# Patient Record
Sex: Female | Born: 1935 | Race: Black or African American | Hispanic: No | Smoking: Never smoker
Health system: Southern US, Community
[De-identification: ages and names within clinical notes are randomized; demographics above are authoritative.]

## PROBLEM LIST (undated history)

## (undated) DIAGNOSIS — G20A1 Parkinson's disease without dyskinesia, without mention of fluctuations: Secondary | ICD-10-CM

## (undated) DIAGNOSIS — G20 Parkinson's disease: Secondary | ICD-10-CM

## (undated) DIAGNOSIS — I509 Heart failure, unspecified: Secondary | ICD-10-CM

## (undated) DIAGNOSIS — I10 Essential (primary) hypertension: Secondary | ICD-10-CM

## (undated) DIAGNOSIS — C801 Malignant (primary) neoplasm, unspecified: Secondary | ICD-10-CM

## (undated) DIAGNOSIS — I219 Acute myocardial infarction, unspecified: Secondary | ICD-10-CM

## (undated) DIAGNOSIS — G309 Alzheimer's disease, unspecified: Secondary | ICD-10-CM

## (undated) DIAGNOSIS — F0280 Dementia in other diseases classified elsewhere without behavioral disturbance: Secondary | ICD-10-CM

## (undated) HISTORY — PX: COLON SURGERY: SHX602

## (undated) HISTORY — PX: TONSILLECTOMY: SUR1361

## (undated) HISTORY — PX: ABDOMINAL HYSTERECTOMY: SHX81

## (undated) HISTORY — PX: CHOLECYSTECTOMY: SHX55

## (undated) HISTORY — PX: MASTECTOMY: SHX3

## (undated) HISTORY — PX: BREAST SURGERY: SHX581

## (undated) HISTORY — PX: LEG SURGERY: SHX1003

## (undated) HISTORY — PX: PELVIC FRACTURE SURGERY: SHX119

## (undated) HISTORY — PX: OTHER SURGICAL HISTORY: SHX169

---

## 2001-09-16 ENCOUNTER — Emergency Department (HOSPITAL_COMMUNITY): Admission: EM | Admit: 2001-09-16 | Discharge: 2001-09-16 | Payer: Self-pay | Admitting: Emergency Medicine

## 2001-09-16 ENCOUNTER — Encounter: Payer: Self-pay | Admitting: Emergency Medicine

## 2001-09-23 ENCOUNTER — Ambulatory Visit (HOSPITAL_COMMUNITY): Admission: RE | Admit: 2001-09-23 | Discharge: 2001-09-23 | Payer: Self-pay | Admitting: Family Medicine

## 2001-09-23 ENCOUNTER — Encounter: Payer: Self-pay | Admitting: Family Medicine

## 2002-06-13 ENCOUNTER — Emergency Department (HOSPITAL_COMMUNITY): Admission: EM | Admit: 2002-06-13 | Discharge: 2002-06-13 | Payer: Self-pay | Admitting: Emergency Medicine

## 2002-06-13 ENCOUNTER — Encounter: Payer: Self-pay | Admitting: Emergency Medicine

## 2002-06-16 ENCOUNTER — Encounter: Payer: Self-pay | Admitting: Emergency Medicine

## 2002-06-16 ENCOUNTER — Inpatient Hospital Stay (HOSPITAL_COMMUNITY): Admission: EM | Admit: 2002-06-16 | Discharge: 2002-06-19 | Payer: Self-pay | Admitting: Emergency Medicine

## 2002-06-17 ENCOUNTER — Encounter: Payer: Self-pay | Admitting: Family Medicine

## 2002-06-18 ENCOUNTER — Encounter: Payer: Self-pay | Admitting: General Surgery

## 2002-11-21 ENCOUNTER — Encounter: Payer: Self-pay | Admitting: *Deleted

## 2002-11-21 ENCOUNTER — Emergency Department (HOSPITAL_COMMUNITY): Admission: EM | Admit: 2002-11-21 | Discharge: 2002-11-21 | Payer: Self-pay | Admitting: *Deleted

## 2003-06-02 ENCOUNTER — Emergency Department (HOSPITAL_COMMUNITY): Admission: EM | Admit: 2003-06-02 | Discharge: 2003-06-02 | Payer: Self-pay | Admitting: Emergency Medicine

## 2004-05-12 ENCOUNTER — Emergency Department (HOSPITAL_COMMUNITY): Admission: EM | Admit: 2004-05-12 | Discharge: 2004-05-12 | Payer: Self-pay | Admitting: Emergency Medicine

## 2004-06-08 ENCOUNTER — Ambulatory Visit (HOSPITAL_COMMUNITY): Admission: RE | Admit: 2004-06-08 | Discharge: 2004-06-08 | Payer: Self-pay | Admitting: General Surgery

## 2004-12-17 ENCOUNTER — Emergency Department (HOSPITAL_COMMUNITY): Admission: EM | Admit: 2004-12-17 | Discharge: 2004-12-17 | Payer: Self-pay | Admitting: Emergency Medicine

## 2005-04-11 ENCOUNTER — Emergency Department (HOSPITAL_COMMUNITY): Admission: EM | Admit: 2005-04-11 | Discharge: 2005-04-11 | Payer: Self-pay | Admitting: Emergency Medicine

## 2005-07-19 ENCOUNTER — Emergency Department (HOSPITAL_COMMUNITY): Admission: EM | Admit: 2005-07-19 | Discharge: 2005-07-19 | Payer: Self-pay | Admitting: Emergency Medicine

## 2005-12-17 ENCOUNTER — Emergency Department (HOSPITAL_COMMUNITY): Admission: EM | Admit: 2005-12-17 | Discharge: 2005-12-17 | Payer: Self-pay | Admitting: Emergency Medicine

## 2005-12-22 ENCOUNTER — Emergency Department (HOSPITAL_COMMUNITY): Admission: EM | Admit: 2005-12-22 | Discharge: 2005-12-22 | Payer: Self-pay | Admitting: Emergency Medicine

## 2006-02-05 ENCOUNTER — Emergency Department (HOSPITAL_COMMUNITY): Admission: EM | Admit: 2006-02-05 | Discharge: 2006-02-05 | Payer: Self-pay | Admitting: Emergency Medicine

## 2006-03-24 ENCOUNTER — Emergency Department (HOSPITAL_COMMUNITY): Admission: RE | Admit: 2006-03-24 | Discharge: 2006-03-24 | Payer: Self-pay | Admitting: Emergency Medicine

## 2006-05-04 ENCOUNTER — Ambulatory Visit: Payer: Self-pay | Admitting: Family Medicine

## 2007-05-07 ENCOUNTER — Emergency Department (HOSPITAL_COMMUNITY): Admission: EM | Admit: 2007-05-07 | Discharge: 2007-05-07 | Payer: Self-pay | Admitting: Emergency Medicine

## 2007-07-08 ENCOUNTER — Ambulatory Visit (HOSPITAL_COMMUNITY): Admission: RE | Admit: 2007-07-08 | Discharge: 2007-07-08 | Payer: Self-pay | Admitting: Family Medicine

## 2008-04-05 ENCOUNTER — Emergency Department (HOSPITAL_COMMUNITY): Admission: EM | Admit: 2008-04-05 | Discharge: 2008-04-05 | Payer: Self-pay | Admitting: Emergency Medicine

## 2009-06-20 ENCOUNTER — Emergency Department (HOSPITAL_COMMUNITY): Admission: EM | Admit: 2009-06-20 | Discharge: 2009-06-20 | Payer: Self-pay | Admitting: Emergency Medicine

## 2009-11-23 ENCOUNTER — Emergency Department (HOSPITAL_COMMUNITY): Admission: EM | Admit: 2009-11-23 | Discharge: 2009-11-23 | Payer: Self-pay | Admitting: Emergency Medicine

## 2010-01-27 ENCOUNTER — Ambulatory Visit (HOSPITAL_COMMUNITY): Admission: RE | Admit: 2010-01-27 | Discharge: 2010-01-27 | Payer: Self-pay | Admitting: Neurology

## 2010-08-09 ENCOUNTER — Emergency Department (HOSPITAL_COMMUNITY)
Admission: EM | Admit: 2010-08-09 | Discharge: 2010-08-09 | Payer: Self-pay | Source: Home / Self Care | Admitting: Emergency Medicine

## 2010-10-08 ENCOUNTER — Encounter: Payer: Self-pay | Admitting: General Surgery

## 2010-12-11 LAB — POCT I-STAT, CHEM 8
BUN: 17 mg/dL (ref 6–23)
Calcium, Ion: 1.11 mmol/L — ABNORMAL LOW (ref 1.12–1.32)
Chloride: 105 mEq/L (ref 96–112)
Creatinine, Ser: 0.8 mg/dL (ref 0.4–1.2)
Glucose, Bld: 108 mg/dL — ABNORMAL HIGH (ref 70–99)
HCT: 43 % (ref 36.0–46.0)
Hemoglobin: 14.6 g/dL (ref 12.0–15.0)
Potassium: 4.1 mEq/L (ref 3.5–5.1)
Sodium: 138 mEq/L (ref 135–145)
TCO2: 29 mmol/L (ref 0–100)

## 2011-01-16 ENCOUNTER — Emergency Department (HOSPITAL_COMMUNITY)
Admission: EM | Admit: 2011-01-16 | Discharge: 2011-01-17 | Disposition: A | Discharge status: Home / Self Care | Payer: PRIVATE HEALTH INSURANCE | Attending: Emergency Medicine | Admitting: Emergency Medicine

## 2011-01-16 ENCOUNTER — Emergency Department (HOSPITAL_COMMUNITY): Payer: PRIVATE HEALTH INSURANCE

## 2011-01-16 DIAGNOSIS — G309 Alzheimer's disease, unspecified: Secondary | ICD-10-CM | POA: Insufficient documentation

## 2011-01-16 DIAGNOSIS — Z853 Personal history of malignant neoplasm of breast: Secondary | ICD-10-CM | POA: Insufficient documentation

## 2011-01-16 DIAGNOSIS — M25569 Pain in unspecified knee: Secondary | ICD-10-CM | POA: Insufficient documentation

## 2011-01-16 DIAGNOSIS — I10 Essential (primary) hypertension: Secondary | ICD-10-CM | POA: Insufficient documentation

## 2011-01-16 DIAGNOSIS — Z9889 Other specified postprocedural states: Secondary | ICD-10-CM | POA: Insufficient documentation

## 2011-01-16 DIAGNOSIS — E785 Hyperlipidemia, unspecified: Secondary | ICD-10-CM | POA: Insufficient documentation

## 2011-01-16 DIAGNOSIS — Z79899 Other long term (current) drug therapy: Secondary | ICD-10-CM | POA: Insufficient documentation

## 2011-01-16 DIAGNOSIS — IMO0002 Reserved for concepts with insufficient information to code with codable children: Secondary | ICD-10-CM | POA: Insufficient documentation

## 2011-01-16 DIAGNOSIS — M1710 Unilateral primary osteoarthritis, unspecified knee: Secondary | ICD-10-CM | POA: Insufficient documentation

## 2011-01-16 DIAGNOSIS — F0280 Dementia in other diseases classified elsewhere without behavioral disturbance: Secondary | ICD-10-CM | POA: Insufficient documentation

## 2011-02-03 NOTE — Discharge Summary (Signed)
[name]                            ACCOUNT NO.:  [id number]   MEDICAL RECORD NO.:  [id number]                   PATIENT TYPE:  INP   LOCATION:  A330                                 FACILITY:  [facility]   PHYSICIAN:  [name], M.D.                DATE OF BIRTH:  [birth year]   DATE OF ADMISSION:  06/16/2002  DATE OF DISCHARGE:  06/19/2002                                 DISCHARGE SUMMARY   PATIENT IDENTIFICATION:  The patient was an 76 year old separated  multiparous black female from [place], [place].   CHIEF COMPLAINT:  Progressive abdominal pain.  The patient was seen in the  emergency room on June 13, 2002 complaining of abdominal pain.  X-ray  study and blood works were within normal limits.  She returned on the day of  admission with complaint of increased abdominal pain.  She described the  pain as sharp and piercing.  She also complained of nausea and vomiting.  She described her vomiting as food stuff.  The patient denied hematemesis,  diarrhea, abdominal distention, fever and chills.   PAST MEDICAL HISTORY:  Significant for exploratory laparotomy with primary  closure of sigmoid colon perforation on September 07, 2000.  Medical history  was also positive for diverticulosis, chronic mild dementia, hypertension,  chronic anxiety, status post left mastectomy, chronic lymphedema of left  arm.   ALLERGIES:  PENICILLIN.   HOSPITALIZATIONS:  Positive for hospitalizations for gastritis and  esophageal varix in May 1995 via EGD; right colectomy in May 1995;  supraventricular tachy in April 2000; modified radical mastectomy secondary  to breast cancer; hysterectomy; appendectomy; tonsillectomy; and right  corneal implant.   FAMILY HISTORY:  Revealed mother and father deceased, cause unknown; several  adult brothers and sisters with health unknown; adult children living,  health unknown.   HOSPITAL COURSE:  1. Abdominal pain probably secondary to  ileus.  General appearance revealed     an elderly, medium-framed, medium-height, alert black female in no     apparent respiratory distress.  Vital signs on admission were as follows:     Temperature 97.5, pulse 59, respirations 20, blood pressure 183/72.  Skin     was warm and dry.  Sclerae were white.  Lungs were clear.  Heart revealed     audible S1 and S2 without murmur.  Rate was 68 and rhythm was regular.     Abdomen demonstrated no distention and healed, multiple old surgical     scars.  Abdominal pain exam demonstrated no palpable masses or     organomegaly.  Rectal examination demonstrated no external lesion.     Visual exam demonstrated pink tinged and guaiac positive.  Rectal exam     demonstrated no palpable rectal vault masses.  The patient was     neurologically alert and oriented to person and place at time of     admission.  Cranial nerves II-XII appeared intact.  Significant labs on     admission:  White count 9.0; hemoglobin 13.2; hematocrit 40.4; platelets     300,000.  Sodium 136, potassium 4.1, chloride 104, CO2  26, glucose 133,     BUN 9, creatinine 0.7, calcium 9.0, total protein 7.2, albumin 3.5, AST     17, ALT 12, ALP 61, total bilirubin 0.6, serum amylase 66, serum lipase     17.  Urinalysis:  Specific gravity greater than 1.030, ph 6.0, no     glucose, moderate amount of hemoglobin, no bilirubin, no ketones, no     protein, nitrite negative, no leukocyte esterase, 3-6 rbc's.  CT of the     abdomen on June 16, 2002 was read as borderline small bowel     dilatation suggesting the possibility of small-grade partial small bowel     obstruction versus regional ileus; polycystic liver disease and several     small renal lesions likely representing cysts.  CT of the pelvis with     contrast read as apparent transition of small bowel caliber in the lower     pelvis suggesting possible small bowel obstruction, potentially related     to adhesions.  However, the  more dilated proximal small bowel is only     borderline dilated in size and thus most likely low-grade partial     obstruction.  There was minimal bowel wall thickening in the region of     the transition which was not focal and thus was not felt to represent any     mass - read by Dr. Soyla Murphy. Liebkemann.  The patient was admitted to the     medical floor.  She was made n.p.o.  A surgical consult was ordered.  NG     tube was started at moderate intermittent suction with IV fluids, Pepcid     20 mg IV, analgesic for pain, and other supportive measures.  A surgical     consult was done by Dr. Maggie Schwalbe during this hospitalization on June 17, 2002.  Dr. Katrinka Blazing ordered a small bowel follow through which was     completed on June 18, 2002.  Small bowel follow through was read as no     obstruction and status post right and proximal transverse colectomy by     Dr. Zella Ball.  The patient's hospital course was benign.  She     experienced some nausea on admission.  At time of discharge she had no     complaint of abdominal pain, nausea, vomiting, or distention.  She was     tolerating a regular diet without problem.  She was discharged to her     home where her family assists in her care.  Last white count was 7.4 on     June 18, 2002 with a hemoglobin of 12.6 and hematocrit 38.9.  Last     electrolytes was on June 18, 2002 and demonstrated the following:     Sodium 142, potassium 4.1, chloride 106, CO2 28, BUN 8, creatinine 0.9.     Abdominal exam on the morning of discharge demonstrated no tenderness in     all four quadrants, no palpable masses or hepatomegaly. The patient was     alert and oriented to person and place at the time of discharge.  2. Hypertension.  Blood pressure on admission was 183/72.  Lungs were clear.     Heart exam  revealed audible S1, S2 without a murmur.  Rate was 68 and     regular.  The patient was treated with a Catapres-TTS-1 patch for blood     pressure.  Examination of extremities demonstrated no edema.  Blood     pressure reading on the morning of discharge was 149/71.  She had no     complaint of chest pain, palpitations, or shortness of breath.  3. Chronic mild senile dementia. The patient was oriented to person and     place at time of admission.  She experienced confusion to time and place     throughout this hospitalization.  Confusion appeared to be worse at     night.  She did not use any verbally abusive language.  Moreover, there     was no sign of combative behaviour.  The patient's confusion is not new     to her physician or family members.  4. Status post left mastectomy secondary to cancer.  Examination of her left     chest wall demonstrated old healed surgical scar.  The patient had no     nodule of this area or complaint of chest wall pain.  Examination of the     right breast demonstrated no skin changes or nodule on palpation.  Nipple     was erect without discharge.  5. Chronic left upper extremity lymphedema.  Examination of left arm     demonstrated nonpitting swelling.  The patient was advised to keep left     arm elevated at rest on pillow.  The patient had no complaint of left arm     pain or left axillary pain.  6. Diverticulosis.  The patient had a history of diverticulosis.  She had     undergone a colonoscopy in the past and had experienced perforation.     Therefore, no attempt was made to repeat this procedure.  Hemoglobin     remained stable throughout this hospitalization despite slight pink-     tinged residue pertaining to rectal area on admission.  Hemoglobin on     admission was 13.2 and hemoglobin was 12.6 on June 18, 2002.  The     patient's CBC will be repeated periodically as an outpatient.  Family     members were advised to monitor the stools for gross rectal bleeding or     complaint of recurrent abdominal pain.  She has remained afebrile     throughout this hospitalization.   Moreover, there is no history of melena     or black stools.   INSTRUCTIONS AT THE TIME OF DISCHARGE:  1. Diet:  Low salt.  2. Activity:  As tolerated.  3. Follow-up:  At the office on June 24, 2002 at 2:15 p.m.   MEDICATIONS:  1. Catapres-TTS-1 every seven days.  2. Pepcid 20 mg one tablet p.o. q.h.s.  3. Xanax 0.5 mg p.o. q.12h.  4. Baby aspirin one p.o. q.d.  5. Do not take Cardizem or metoprolol.   PRIMARY DIAGNOSIS:  Abdominal pain secondary to regional ileus.   SECONDARY DIAGNOSES:  1. Chronic senile dementia.  2. Status post left mastectomy.  3. Hypertension.  4. Diverticulosis.  5. Chronic left upper extremity lymphedema.  [name], M.D.    [name]  D:  06/19/2002  T:  06/23/2002  Job:  [id number]

## 2011-02-03 NOTE — H&P (Signed)
NAMEBARBIE, [name]                            ACCOUNT NO.:  [id number]   MEDICAL RECORD NO.:  [id number]                   PATIENT TYPE:  INP   LOCATION:  A330                                 FACILITY:  [facility]   PHYSICIAN:  Annia Friendly. Loleta Chance, M.D.                DATE OF BIRTH:  07/29/1920   DATE OF ADMISSION:  06/16/2002  DATE OF DISCHARGE:                                HISTORY & PHYSICAL   HISTORY OF PRESENT ILLNESS:  The patient is an 77 year old, separated,  multiparous black female from [place], [place].  The patient  returned to the emergency room, complaining of progressive sella pain.  The  patient was seen in the emergency room on June 13, 2002, complaining of  abdominal pain.  X-ray study and blood work were within normal limits.  She  returned today with complaint of increased abdominal pain.  She described  the pain as sharp pierce.  She also complained of nausea and vomiting on the  morning of admission.  She denies hematemesis, diarrhea, abdominal  distention, fever, and chills.  X-ray of abdomen was read as borderline  dilated jejunum, probable low grade positive small bowel obstruction with  transition from borderline dilated bowel to small caliber bowel seen in mid  pelvis.  (possibly due to adhesions).  Also seen was polycystic liver, again  noted.   PAST SURGICAL HISTORY:  Significant for exploratory laparotomy with primary  closure of sigmoid colon perforation on September 07, 2000, by Dr. Einar Crow.  Smith.   PAST MEDICAL HISTORY:  1. Diverticulosis.  2. Chronic mild dementia.  3. Hypertension.  4. Chronic anxiety.  5. Status post left mastectomy.  6. Chronic lymphedema of her left arm.  7. Negative for tuberculosis, sickle cell, asthma, and seizure disorder.   MEDICATIONS:  1. Cardizem CD 240 mg p.o. q.d.  2. Metoprolol 25 mg p.o. q.d.  3. Xanax 0.5 mg p.o. b.i.d.   ALLERGIES:  The patient is allergic to PENICILLIN.   PAST MEDICAL HISTORY:  1.  Positive for hospitalization for gastritis and esophageal varix, did EGD     in May 1995.  2. Status post right colectomy in May of 1995.  3. Supraventricular tachycardia in April 2000.  4. The patient also has undergone left modified radical mastectomy secondary     to breast cancer.  5. Hysterectomy.  6. Appendectomy.  7. Tonsillectomy.  8. Right corneal lens implant.   FAMILY HISTORY:  Mother and father deceased; several adult brothers and  sisters living, health unknown.  Adult children living, health unknown.   REVIEW OF SYSTEMS:  Positive for forgetfulness, chronic swelling of left  upper arm, and episode of right leg stiffness and pain.   PHYSICAL EXAMINATION:  GENERAL:  Lies on the floor.  Revealed an elderly,  medium-framed, medium height, alert, black female in no apparent respiratory  distress.  VITAL SIGNS:  Temperature  97.5, pulse 59, respirations 20, blood pressure  183/72.  SKIN:  Warm and dry.  HEENT:  Head normocephalic.  Ears:  Normal auricle.  External canals  positive for some cerumen.  Eyes:  Lids negative for ptosis.  Positive right  coronary implant.  Sclerae white.  Extraocular movements intact.  Nose:  Negative for discharge.  Mouth:  Dentition fair, positive missing teeth, no  bleeding gums.  Posterior pharynx benign.  NECK:  Negative for lymphadenopathy or thyromegaly.  LUNGS:  Clear.  HEART:  Audible S1, S2, without murmur.  Rate 68 and rhythm regular.  BREASTS:  Right breast, no skin changes, no nodular palpation, nipples erect  without discharge.  Left breast absent, positive healed surgical scar.  ABDOMEN:  No distention.  Positive multiple healed hyper gastric surgical  scars.  Soft.  Positive for mid hypogastric tenderness.  No palpable mass,  no organomegaly.  EXTERNAL GENITALIA:  Normal female.  RECTAL:  No external bleeding.  Digital exam:  Pink-tinged and guaiac  positive.  No palpable rectal vault masses.  EXTREMITIES:  Left upper positive  nontender edema.  Right knee positive for  crepitus and ___________ heel lateral elliptical scar, no edema.  Palpable  dorsalis pedis bilaterally.  NEUROLOGIC:  Alert and oriented to person and place.  Cranial nerves II-XII  appeared intact.   LABORATORY DATA:  White count 9.0, hemoglobin 13.2, hematocrit 40.4,  platelets 300,000.  Sodium 136, potassium 4.6, chloride 104, CO2 26, glucose  133, BUN 9, creatinine 0.7, calcium 9.0, total protein 7.2.  Albumin 3.5,  AST 17, ALT 12, ALP 61, total bilirubin 0.6.  Serum amylase  66, serum  lipase 17.  Urinalysis:  pH 6.0, specific gravity greater than 1.030.  Moderate mild hemoglobin, no ketones, no protein, nitrate negative,  leukocyte esterase negative, few epithelial cells, 3-6 rbc's.   IMPRESSION:  Abdominal pain, probably secondary a low grade, possibly small  bowel obstruction.   SECONDARY DIAGNOSES:  1. Hypertension.  2. Status post left mastectomy.  3. Chronic left upper extremity lymphedema.  4. Diverticulosis.   PLAN:  1. NG tube at the moderate intermittent suction.  2. N.p.o.  3. IV fluid D5 half normal saline with 10 mEq potassium chloride at 125 cc     per hour.  4. Surgical consult.  5. Pepcid 20 mg IV q.d.  6. Levaquin 500 mg IV q.d.  7. Repeat CBC and met 7 every morning x3.  8. Analgesia for pain.  9.     Record daily I's and O's.  10.      Catapres TTS patch 0.2 every seven days.  11.      Monitor blood pressure.                                                   Annia Friendly. Loleta Chance, M.D.    Levonne Hubert  D:  06/16/2002  T:  06/16/2002  Job:  [id number]

## 2011-02-16 ENCOUNTER — Emergency Department (HOSPITAL_COMMUNITY): Payer: PRIVATE HEALTH INSURANCE

## 2011-02-16 ENCOUNTER — Emergency Department (HOSPITAL_COMMUNITY)
Admission: EM | Admit: 2011-02-16 | Discharge: 2011-02-17 | Disposition: A | Discharge status: Home / Self Care | Payer: PRIVATE HEALTH INSURANCE | Attending: Emergency Medicine | Admitting: Emergency Medicine

## 2011-02-16 ENCOUNTER — Encounter (HOSPITAL_COMMUNITY): Payer: Self-pay | Admitting: Radiology

## 2011-02-16 DIAGNOSIS — Y92009 Unspecified place in unspecified non-institutional (private) residence as the place of occurrence of the external cause: Secondary | ICD-10-CM | POA: Insufficient documentation

## 2011-02-16 DIAGNOSIS — S7000XA Contusion of unspecified hip, initial encounter: Secondary | ICD-10-CM | POA: Insufficient documentation

## 2011-02-16 DIAGNOSIS — F0280 Dementia in other diseases classified elsewhere without behavioral disturbance: Secondary | ICD-10-CM | POA: Insufficient documentation

## 2011-02-16 DIAGNOSIS — I10 Essential (primary) hypertension: Secondary | ICD-10-CM | POA: Insufficient documentation

## 2011-02-16 DIAGNOSIS — Z79899 Other long term (current) drug therapy: Secondary | ICD-10-CM | POA: Insufficient documentation

## 2011-02-16 DIAGNOSIS — E785 Hyperlipidemia, unspecified: Secondary | ICD-10-CM | POA: Insufficient documentation

## 2011-02-16 DIAGNOSIS — Z853 Personal history of malignant neoplasm of breast: Secondary | ICD-10-CM | POA: Insufficient documentation

## 2011-02-16 DIAGNOSIS — G309 Alzheimer's disease, unspecified: Secondary | ICD-10-CM | POA: Insufficient documentation

## 2011-02-16 DIAGNOSIS — W19XXXA Unspecified fall, initial encounter: Secondary | ICD-10-CM | POA: Insufficient documentation

## 2011-02-16 DIAGNOSIS — Z7982 Long term (current) use of aspirin: Secondary | ICD-10-CM | POA: Insufficient documentation

## 2011-02-16 DIAGNOSIS — Y998 Other external cause status: Secondary | ICD-10-CM | POA: Insufficient documentation

## 2011-02-16 DIAGNOSIS — R112 Nausea with vomiting, unspecified: Secondary | ICD-10-CM | POA: Insufficient documentation

## 2011-02-16 HISTORY — DX: Essential (primary) hypertension: I10

## 2011-02-16 HISTORY — DX: Malignant (primary) neoplasm, unspecified: C80.1

## 2011-02-16 LAB — COMPREHENSIVE METABOLIC PANEL
ALT: 9 U/L (ref 0–35)
AST: 17 U/L (ref 0–37)
Albumin: 3.5 g/dL (ref 3.5–5.2)
Alkaline Phosphatase: 59 U/L (ref 39–117)
BUN: 19 mg/dL (ref 6–23)
CO2: 29 mEq/L (ref 19–32)
Calcium: 9.7 mg/dL (ref 8.4–10.5)
Chloride: 101 mEq/L (ref 96–112)
Creatinine, Ser: 1.08 mg/dL (ref 0.4–1.2)
GFR calc Af Amer: 58 mL/min — ABNORMAL LOW (ref >60)
GFR calc non Af Amer: 48 mL/min — ABNORMAL LOW (ref >60)
Glucose, Bld: 138 mg/dL — ABNORMAL HIGH (ref 70–99)
Potassium: 3.7 mEq/L (ref 3.5–5.1)
Sodium: 139 mEq/L (ref 135–145)
Total Bilirubin: 0.2 mg/dL — ABNORMAL LOW (ref 0.3–1.2)
Total Protein: 7.8 g/dL (ref 6.0–8.3)

## 2011-02-16 LAB — DIFFERENTIAL
Basophils Absolute: 0 10*3/uL (ref 0.0–0.1)
Basophils Relative: 0 % (ref 0–1)
Eosinophils Absolute: 0 10*3/uL (ref 0.0–0.7)
Eosinophils Relative: 0 % (ref 0–5)
Lymphocytes Relative: 13 % (ref 12–46)
Lymphs Abs: 1.7 10*3/uL (ref 0.7–4.0)
Monocytes Absolute: 0.8 10*3/uL (ref 0.1–1.0)
Monocytes Relative: 6 % (ref 3–12)
Neutro Abs: 10.4 10*3/uL — ABNORMAL HIGH (ref 1.7–7.7)
Neutrophils Relative %: 81 % — ABNORMAL HIGH (ref 43–77)

## 2011-02-16 LAB — PROTIME-INR
INR: 1.01 (ref 0.00–1.49)
Prothrombin Time: 13.5 seconds (ref 11.6–15.2)

## 2011-02-16 LAB — CBC
HCT: 37.3 % (ref 36.0–46.0)
Hemoglobin: 11.7 g/dL — ABNORMAL LOW (ref 12.0–15.0)
MCH: 22.8 pg — ABNORMAL LOW (ref 26.0–34.0)
MCHC: 31.4 g/dL (ref 30.0–36.0)
MCV: 72.7 fL — ABNORMAL LOW (ref 78.0–100.0)
Platelets: 302 10*3/uL (ref 150–400)
RBC: 5.13 MIL/uL — ABNORMAL HIGH (ref 3.87–5.11)
RDW: 14.8 % (ref 11.5–15.5)
WBC: 12.9 10*3/uL — ABNORMAL HIGH (ref 4.0–10.5)

## 2011-02-16 LAB — APTT: aPTT: 25 seconds (ref 24–37)

## 2011-02-17 LAB — URINALYSIS, ROUTINE W REFLEX MICROSCOPIC
Bilirubin Urine: NEGATIVE
Glucose, UA: NEGATIVE mg/dL
Leukocytes, UA: NEGATIVE
Nitrite: NEGATIVE
Protein, ur: NEGATIVE mg/dL
Specific Gravity, Urine: >1.03 — ABNORMAL HIGH (ref 1.005–1.030)
Urobilinogen, UA: 0.2 mg/dL (ref 0.0–1.0)
pH: 5.5 (ref 5.0–8.0)

## 2011-02-17 LAB — URINE MICROSCOPIC-ADD ON

## 2011-02-19 LAB — URINE CULTURE
Colony Count: NO GROWTH
Culture  Setup Time: 201206020047
Culture: NO GROWTH

## 2011-06-16 LAB — CULTURE, ROUTINE-ABSCESS: Gram Stain: NONE SEEN

## 2011-06-30 LAB — POCT CARDIAC MARKERS
CKMB, poc: <1 — ABNORMAL LOW
Myoglobin, poc: 35.9
Operator id: 208401
Troponin i, poc: <0.05

## 2011-07-27 ENCOUNTER — Other Ambulatory Visit (HOSPITAL_COMMUNITY): Payer: Self-pay | Admitting: Family Medicine

## 2011-07-27 ENCOUNTER — Ambulatory Visit (HOSPITAL_COMMUNITY)
Admission: RE | Admit: 2011-07-27 | Discharge: 2011-07-27 | Disposition: A | Discharge status: Home / Self Care | Payer: PRIVATE HEALTH INSURANCE | Source: Ambulatory Visit | Attending: Family Medicine | Admitting: Family Medicine

## 2011-07-27 DIAGNOSIS — R52 Pain, unspecified: Secondary | ICD-10-CM

## 2011-07-27 DIAGNOSIS — M25529 Pain in unspecified elbow: Secondary | ICD-10-CM | POA: Insufficient documentation

## 2011-08-13 ENCOUNTER — Emergency Department (HOSPITAL_COMMUNITY): Payer: PRIVATE HEALTH INSURANCE

## 2011-08-13 ENCOUNTER — Emergency Department (HOSPITAL_COMMUNITY)
Admission: EM | Admit: 2011-08-13 | Discharge: 2011-08-13 | Disposition: A | Discharge status: Home / Self Care | Payer: PRIVATE HEALTH INSURANCE | Attending: Emergency Medicine | Admitting: Emergency Medicine

## 2011-08-13 ENCOUNTER — Encounter (HOSPITAL_COMMUNITY): Payer: Self-pay

## 2011-08-13 DIAGNOSIS — Z859 Personal history of malignant neoplasm, unspecified: Secondary | ICD-10-CM | POA: Insufficient documentation

## 2011-08-13 DIAGNOSIS — IMO0002 Reserved for concepts with insufficient information to code with codable children: Secondary | ICD-10-CM | POA: Insufficient documentation

## 2011-08-13 DIAGNOSIS — Z9079 Acquired absence of other genital organ(s): Secondary | ICD-10-CM | POA: Insufficient documentation

## 2011-08-13 DIAGNOSIS — Y92009 Unspecified place in unspecified non-institutional (private) residence as the place of occurrence of the external cause: Secondary | ICD-10-CM | POA: Insufficient documentation

## 2011-08-13 DIAGNOSIS — F0390 Unspecified dementia without behavioral disturbance: Secondary | ICD-10-CM | POA: Insufficient documentation

## 2011-08-13 DIAGNOSIS — X58XXXA Exposure to other specified factors, initial encounter: Secondary | ICD-10-CM | POA: Insufficient documentation

## 2011-08-13 DIAGNOSIS — Z9010 Acquired absence of unspecified breast and nipple: Secondary | ICD-10-CM | POA: Insufficient documentation

## 2011-08-13 DIAGNOSIS — I10 Essential (primary) hypertension: Secondary | ICD-10-CM | POA: Insufficient documentation

## 2011-08-13 MED ORDER — ACETAMINOPHEN 500 MG PO TABS
1000.0000 mg | ORAL_TABLET | Freq: Once | ORAL | Status: AC
  Administered 2011-08-13: 1000 mg via ORAL
  Filled 2011-08-13: qty 2

## 2011-08-13 NOTE — ED Provider Notes (Signed)
Scribed for Donnetta Hutching, MD, the patient was seen in room APA08/AP[facility]08 . This chart was scribed by Ellie Lunch.    CSN: [id number] Arrival date & time: 08/13/2011  5:06 PM   First MD Initiated Contact with Patient 08/13/11 1714      Chief Complaint  Patient presents with  . Edema  . Hand Pain    (Consider location/radiation/quality/duration/timing/severity/associated sxs/prior treatment) HPI [name] is a 77 y.o. female who presents to the Emergency Department complaining of left hand pain located in her 4th digit after she "mashed" it on something in her home. Pain is described as constant and currently rated 5/10 in severity. Pt is unsure when she did this. Pt's relatives states that PT treats hand w./alcohol on a daily basis with minimal improvement. Pain is aggravated by usage of her 4th digit. At baseline pt has edema in her left  upper arm and forearm from a previous mastectomy.  Pt denies any chest pain, neck pain, or HA. Level V caveat secondary to dementia   Past Medical History  Diagnosis Date  . Hypertension   . Cancer     Past Surgical History  Procedure Date  . Breast surgery   . Abdominal hysterectomy   . Mastectomy     No family history on file.  History  Substance Use Topics  . Smoking status: Never Smoker   . Smokeless tobacco: Not on file  . Alcohol Use: No  Pt lives with daughter   Review of Systems  Unable to perform ROS: Dementia   10 Systems reviewed and are negative for acute change except as noted in the HPI.  Allergies  Penicillins  Home Medications  No current outpatient prescriptions on file.  BP 143/64  Pulse 60  Temp(Src) 97.6 F (36.4 C) (Oral)  Resp 18  Wt 123 lb 9.6 oz (56.065 kg)  SpO2 100%  Physical Exam  Nursing note and vitals reviewed. Constitutional: She is oriented to person, place, and time. She appears well-developed and well-nourished.  HENT:  Head: Normocephalic and atraumatic.  Eyes: Conjunctivae and EOM  are normal. Pupils are equal, round, and reactive to light.  Neck: Normal range of motion. Neck supple.  Cardiovascular: Normal rate and regular rhythm.   Pulmonary/Chest: Effort normal and breath sounds normal.  Abdominal: Soft. Bowel sounds are normal.  Musculoskeletal: Normal range of motion. She exhibits edema (left forarm and hand.) and tenderness (left hand 4th MCP joint).       4th finger chronically flexed at PIP and DIP   Neurological: She is alert and oriented to person, place, and time.  Skin: Skin is warm and dry.  Psychiatric: She has a normal mood and affect.    ED Course  Procedures (including critical care time) OTHER DATA REVIEWED: Nursing notes, vital signs, and past medical records reviewed.   DIAGNOSTIC STUDIES: Oxygen Saturation is 100% on room air, normal by my interpretation.     Labs Reviewed - No data to display Dg Hand Complete Left  08/13/2011  *RADIOLOGY REPORT*  Clinical Data: Swelling.  Small finger pain.  LEFT HAND - COMPLETE 3+ VIEW  Comparison: None.  Findings: There is an intra-articular fracture at the base of the ring finger proximal phalanx.  This is poorly visualized on the lateral view due to bony overlap.  Diffuse soft tissue swelling of the hand is present.  There is an acute fracture of the small finger volar proximal phalanx base extending intra-articular which is difficult to visualize on  the lateral view.  Mild anterior subluxation of the fifth MCP joint.  MCP joint osteoarthritis is present, most pronounced at the first and second.  Soft tissue swelling overlies the base of the ring finger suggesting that the proximal phalanx fracture may be acute or subacute.  Distal radial ulnar joint osteoarthritis is present. Diffuse osteopenia is present.  IMPRESSION: Acute minimally displaced intra-articular fractures of the bases of the proximal phalanx of the left ring finger and small finger.  Original Report Authenticated By: Andreas Newport, M.D.     5:38 EDP at PT bedside. Discussed plan to order xray of left hand and treat pain with tylenol.    ED MEDICATIONS Medications  acetaminophen (TYLENOL) tablet 1,000 mg (1000 mg Oral Given 08/13/11 1734)     No diagnosis found.    MDM  X-ray shows fracture of the proximal phalanx of the fourth and fifth digits of the left hand.  Followup with orthopedic surgeon.   I personally performed the services described in this documentation, which was scribed in my presence. The recorded information has been reviewed and considered.          Donnetta Hutching, MD 08/13/11 [phone]

## 2011-08-13 NOTE — ED Notes (Signed)
Pt brought in by family for left hand swelling and pain. Pt with dementia and is unable to explain what happened and when it happened.

## 2011-08-14 ENCOUNTER — Encounter: Payer: Self-pay | Admitting: Orthopedic Surgery

## 2011-08-14 ENCOUNTER — Ambulatory Visit (INDEPENDENT_AMBULATORY_CARE_PROVIDER_SITE_OTHER): Payer: PRIVATE HEALTH INSURANCE | Admitting: Orthopedic Surgery

## 2011-08-14 VITALS — BP 160/70 | Ht 61.0 in | Wt 123.0 lb

## 2011-08-14 DIAGNOSIS — IMO0002 Reserved for concepts with insufficient information to code with codable children: Secondary | ICD-10-CM

## 2011-08-14 DIAGNOSIS — S62619A Displaced fracture of proximal phalanx of unspecified finger, initial encounter for closed fracture: Secondary | ICD-10-CM

## 2011-08-14 NOTE — Patient Instructions (Addendum)
Keep wrapped as is

## 2011-08-15 DIAGNOSIS — S62619A Displaced fracture of proximal phalanx of unspecified finger, initial encounter for closed fracture: Secondary | ICD-10-CM | POA: Insufficient documentation

## 2011-08-15 NOTE — Progress Notes (Signed)
Chief complaint injury LEFT hand  Date of injury was November 21  Patient had an unwitnessed injury to the LEFT hand presented to the emergency room with pain and swelling and now comes in for definitive treatment  She is not compliant with splinting she has taken the splint off.  She complains of throbbing swelling constant pain in the LEFT hand associated with dorsal swelling.  She has a negative review of systems as listed by the patient is demented and does not know her full history  2 visiting relatives are with her today she has a history of hypertension she's had a mastectomy gel on medication list which will be scanned into the medical record family history and social history not listed  Physical Exam(12) BP 143/64  Pulse 60  Temp(Src) 97.6 F (36.4 C) (Oral)  Resp 18  Wt 123 lb 9.6 oz (56.065 kg)  SpO2 100%  GENERAL: normal development   CDV: pulses are normal   Skin: normal  Lymph: nodes were not palpable/normal  Psychiatric: awake, alert and oriented  Neuro: normal sensation  MSK The patient is ambulatory Inspection revealed swelling and tenderness at the proximal phalanx of the fifth and fourth digit with painful range of motion and extension deformity at the ring finger.  A strength assessment is notable for weakness in flexion of the digits.  The joints otherwise are stable.  There are no other injuries noted.  The RIGHT hand has no tenderness no deformity is normal range of motion stability and strength  Assessment: LEFT ring and small finger proximal phalanx fractures    Plan: Resplinted.  Recommend splinting and x-rays in a few weeks

## 2011-08-31 ENCOUNTER — Ambulatory Visit: Payer: Medicare Other | Admitting: Orthopedic Surgery

## 2011-08-31 ENCOUNTER — Encounter: Payer: Self-pay | Admitting: Orthopedic Surgery

## 2012-05-07 ENCOUNTER — Emergency Department (HOSPITAL_COMMUNITY): Payer: PRIVATE HEALTH INSURANCE

## 2012-05-07 ENCOUNTER — Emergency Department (HOSPITAL_COMMUNITY)
Admission: EM | Admit: 2012-05-07 | Discharge: 2012-05-08 | Disposition: A | Discharge status: Home / Self Care | Payer: PRIVATE HEALTH INSURANCE | Attending: Emergency Medicine | Admitting: Emergency Medicine

## 2012-05-07 ENCOUNTER — Encounter (HOSPITAL_COMMUNITY): Payer: Self-pay | Admitting: Emergency Medicine

## 2012-05-07 DIAGNOSIS — T1490XA Injury, unspecified, initial encounter: Secondary | ICD-10-CM | POA: Insufficient documentation

## 2012-05-07 DIAGNOSIS — G309 Alzheimer's disease, unspecified: Secondary | ICD-10-CM | POA: Insufficient documentation

## 2012-05-07 DIAGNOSIS — M79609 Pain in unspecified limb: Secondary | ICD-10-CM | POA: Insufficient documentation

## 2012-05-07 DIAGNOSIS — W1809XA Striking against other object with subsequent fall, initial encounter: Secondary | ICD-10-CM | POA: Insufficient documentation

## 2012-05-07 DIAGNOSIS — M5137 Other intervertebral disc degeneration, lumbosacral region: Secondary | ICD-10-CM | POA: Insufficient documentation

## 2012-05-07 DIAGNOSIS — M25559 Pain in unspecified hip: Secondary | ICD-10-CM | POA: Insufficient documentation

## 2012-05-07 DIAGNOSIS — W19XXXA Unspecified fall, initial encounter: Secondary | ICD-10-CM

## 2012-05-07 DIAGNOSIS — F0280 Dementia in other diseases classified elsewhere without behavioral disturbance: Secondary | ICD-10-CM | POA: Insufficient documentation

## 2012-05-07 DIAGNOSIS — M51379 Other intervertebral disc degeneration, lumbosacral region without mention of lumbar back pain or lower extremity pain: Secondary | ICD-10-CM | POA: Insufficient documentation

## 2012-05-07 HISTORY — DX: Alzheimer's disease, unspecified: G30.9

## 2012-05-07 HISTORY — DX: Dementia in other diseases classified elsewhere, unspecified severity, without behavioral disturbance, psychotic disturbance, mood disturbance, and anxiety: F02.80

## 2012-05-07 HISTORY — DX: Acute myocardial infarction, unspecified: I21.9

## 2012-05-07 MED ORDER — ACETAMINOPHEN 500 MG PO TABS
1000.0000 mg | ORAL_TABLET | Freq: Once | ORAL | Status: AC
  Administered 2012-05-07: 1000 mg via ORAL
  Filled 2012-05-07: qty 2

## 2012-05-07 NOTE — ED Notes (Signed)
Daughter states patient fell about 1830 today hitting her left arm and side on a table. Complaining of left hip and left arm pain. Patient ambulated to triage area with assistance of daughter.

## 2012-05-07 NOTE — ED Provider Notes (Signed)
History  This chart was scribed for Doug Sou, MD by Bennett Scrape. This patient was seen in room APA01/APA01 and the patient's care was started at 10:49PM.  CSN: [id number]  Arrival date & time 05/07/12  2033   First MD Initiated Contact with Patient 05/07/12 2249     Level 5 Caveat- H/O Dementia  Chief Complaint  Patient presents with  . Hip Pain  . Arm Pain  . Fall   level V caveat dementia  Patient is a 78 y.o. female presenting with hip pain. The history is provided by the patient. No language interpreter was used.  Hip Pain This is a new problem. The current episode started 3 to 5 hours ago. The problem occurs constantly. The problem has not changed since onset.   [name] is a 78 y.o. female who presents to the Emergency Department complaining of a fall about 4 hours ago. Daughter reports that the patient fell from a standing position hitting a table, a vase and then the floor landing on her left hip and left arm. Daughter reports that the pt appears at her normal baseline. She states that she gave the pt tylenol PTA. ED staff reported that the pt ambulated to the triage area with her daughter's assistance. She also has a h/o CA, HTN and MI. No treatment prior to coming here  Past Medical History  Diagnosis Date  . Hypertension   . Cancer   . Alzheimer's dementia   . Heart attack     Past Surgical History  Procedure Date  . Breast surgery   . Abdominal hysterectomy   . Mastectomy   . Small intestine removed   . Tonsillectomy   . Cholecystectomy   . Colon surgery     History reviewed. No pertinent family history.  History  Substance Use Topics  . Smoking status: Never Smoker   . Smokeless tobacco: Not on file  . Alcohol Use: No  lives with daughter  No OB history provided.  Review of Systems  Unable to perform ROS: Dementia    Allergies  Penicillins  Home Medications   Current Outpatient Rx  Name Route Sig Dispense Refill  .  ACETAMINOPHEN 500 MG PO TABS Oral Take 500 mg by mouth at bedtime.    . ALPRAZOLAM 0.5 MG PO TABS Oral Take 0.5 mg by mouth 3 (three) times daily as needed.    . ASPIRIN EC 81 MG PO TBEC Oral Take 81 mg by mouth every morning.    Marland Kitchen BENZTROPINE MESYLATE 1 MG PO TABS Oral Take 1 mg by mouth daily. At 1400 (2pm)    . BUMETANIDE 0.5 MG PO TABS Oral Take 0.5 mg by mouth every morning.    Marland Kitchen CITALOPRAM HYDROBROMIDE 20 MG PO TABS Oral Take 20 mg by mouth every morning.    Marland Kitchen DICLOFENAC SODIUM 1 % TD GEL Topical Apply topically as needed. For arm pain    . HALOPERIDOL LACTATE 2 MG/ML PO CONC Oral Take 6 mg by mouth 2 (two) times daily.    . IBUPROFEN 200 MG PO TABS Oral Take 400-800 mg by mouth as needed.    Marland Kitchen METOPROLOL TARTRATE 50 MG PO TABS Oral Take 25 mg by mouth every morning.    Marland Kitchen RIVASTIGMINE 9.5 MG/24HR TD PT24 Transdermal Place 1 patch onto the skin daily. *Remove and discard used patches*    . ZOLPIDEM TARTRATE 10 MG PO TABS Oral Take 10 mg by mouth at bedtime.  Triage Vitals: BP 121/81  Pulse 62  Temp 97.5 F (36.4 C) (Oral)  Resp 20  Wt 132 lb (59.875 kg)  SpO2 100%  Physical Exam  Nursing note and vitals reviewed. Constitutional: She appears well-developed and well-nourished.  HENT:  Head: Normocephalic and atraumatic.  Eyes: Conjunctivae are normal. Pupils are equal, round, and reactive to light.  Neck: Neck supple. No tracheal deviation present. No thyromegaly present.  Cardiovascular: Normal rate and regular rhythm.   No murmur heard. Pulmonary/Chest: Effort normal and breath sounds normal.  Abdominal: Soft. Bowel sounds are normal. She exhibits no distension. There is no tenderness.  Musculoskeletal: Normal range of motion. She exhibits no edema and no tenderness.       Pelvis tender at left hip. With some swelling. DP pulse 2+. Left upper extremity minimally tender wrist. All extremities a contusion abrasion or tenderness neurovascular intact  Neurological: She is  alert.       Vital extremities does not follow simple commands  Skin: Skin is warm and dry. No rash noted.    ED Course  Procedures (including critical care time)  DIAGNOSTIC STUDIES: Oxygen Saturation is 100% on room air, normal by my interpretation.    COORDINATION OF CARE: 10:54PM-Discussed treatment plan which includes CT scan with daughter at bedside and daughter agreed to plan.   Labs Reviewed - No data to display Dg Wrist Complete Left  05/07/2012  *RADIOLOGY REPORT*  Clinical Data: Fall  LEFT WRIST - COMPLETE 3+ VIEW  Comparison: 08/13/2011  Findings: Negative for fracture.  Normal alignment.  Moderate degenerative change in the radiocarpal joint with joint space narrowing and spurring.  IMPRESSION: Negative for fracture.   Original Report Authenticated By: Camelia Phenes, M.D.    Dg Hip Complete Left  05/07/2012  *RADIOLOGY REPORT*  Clinical Data: Fall.  LEFT HIP - COMPLETE 2+ VIEW  Comparison: None.  Findings: Normal alignment.  Negative for hip fracture.  Joint space is normal.  Chronic healed fractures of the superior and inferior pubic rami on the left.  IMPRESSION: Negative for acute fracture.   Original Report Authenticated By: Camelia Phenes, M.D.     X-rays reviewed by me No diagnosis found. 12:25 AM patient resting comfortably states "I feel good" after treatment with Tylenol.  MDM  Plan Tylenol as needed for pain Diagnosis #1 fall #2 contusions multiple sites  I personally performed the services described in this documentation, which was scribed in my presence. The recorded information has been reviewed and considered.        Doug Sou, MD 05/08/12 [id number]

## 2012-05-07 NOTE — ED Notes (Signed)
Pt presents via EMS secondary  to a fall at home. Pt fell striking furniture and eventually landed on the floor, all injuries to the left side. Pt c/o left arm and left hip pain. Both are painful to touch. Pt has noted deformity to her left hip, as leg is shortened. Left arm is generally swollen since mastectomy, several years ago. Left arm is swollen to elbow with c/o pain to touch proximal to wrist. Pt has history of combative late stage dementia. Daughter is at pt's bedside. Pt is confused but cooperative at this time.

## 2012-05-07 NOTE — ED Notes (Signed)
Patient transported to X-ray via stretcher. Daughter escorted pt to radiology.

## 2012-06-30 ENCOUNTER — Emergency Department (HOSPITAL_COMMUNITY): Payer: PRIVATE HEALTH INSURANCE

## 2012-06-30 ENCOUNTER — Encounter (HOSPITAL_COMMUNITY): Payer: Self-pay | Admitting: Emergency Medicine

## 2012-06-30 ENCOUNTER — Inpatient Hospital Stay (HOSPITAL_COMMUNITY)
Admission: EM | Admit: 2012-06-30 | Discharge: 2012-07-02 | DRG: 195 | Disposition: A | Discharge status: Home Health | Payer: PRIVATE HEALTH INSURANCE | Attending: Internal Medicine | Admitting: Internal Medicine

## 2012-06-30 DIAGNOSIS — Z90710 Acquired absence of both cervix and uterus: Secondary | ICD-10-CM

## 2012-06-30 DIAGNOSIS — I10 Essential (primary) hypertension: Secondary | ICD-10-CM | POA: Diagnosis present

## 2012-06-30 DIAGNOSIS — G309 Alzheimer's disease, unspecified: Secondary | ICD-10-CM | POA: Diagnosis present

## 2012-06-30 DIAGNOSIS — G20A1 Parkinson's disease without dyskinesia, without mention of fluctuations: Secondary | ICD-10-CM | POA: Diagnosis present

## 2012-06-30 DIAGNOSIS — R531 Weakness: Secondary | ICD-10-CM | POA: Diagnosis present

## 2012-06-30 DIAGNOSIS — J189 Pneumonia, unspecified organism: Secondary | ICD-10-CM

## 2012-06-30 DIAGNOSIS — G20 Parkinson's disease: Secondary | ICD-10-CM | POA: Diagnosis present

## 2012-06-30 DIAGNOSIS — R5381 Other malaise: Secondary | ICD-10-CM

## 2012-06-30 DIAGNOSIS — D509 Iron deficiency anemia, unspecified: Secondary | ICD-10-CM | POA: Diagnosis present

## 2012-06-30 DIAGNOSIS — F0280 Dementia in other diseases classified elsewhere without behavioral disturbance: Secondary | ICD-10-CM | POA: Diagnosis present

## 2012-06-30 DIAGNOSIS — F0390 Unspecified dementia without behavioral disturbance: Secondary | ICD-10-CM

## 2012-06-30 DIAGNOSIS — S62619A Displaced fracture of proximal phalanx of unspecified finger, initial encounter for closed fracture: Secondary | ICD-10-CM

## 2012-06-30 DIAGNOSIS — Z880 Allergy status to penicillin: Secondary | ICD-10-CM

## 2012-06-30 HISTORY — DX: Parkinson's disease: G20

## 2012-06-30 HISTORY — DX: Parkinson's disease without dyskinesia, without mention of fluctuations: G20.A1

## 2012-06-30 LAB — URINE MICROSCOPIC-ADD ON

## 2012-06-30 LAB — COMPREHENSIVE METABOLIC PANEL
ALT: 9 U/L (ref 0–35)
AST: 14 U/L (ref 0–37)
Albumin: 3.1 g/dL — ABNORMAL LOW (ref 3.5–5.2)
Alkaline Phosphatase: 75 U/L (ref 39–117)
BUN: 8 mg/dL (ref 6–23)
CO2: 30 mEq/L (ref 19–32)
Calcium: 9.7 mg/dL (ref 8.4–10.5)
Chloride: 98 mEq/L (ref 96–112)
Creatinine, Ser: 0.69 mg/dL (ref 0.50–1.10)
GFR calc Af Amer: 86 mL/min — ABNORMAL LOW (ref >90)
GFR calc non Af Amer: 74 mL/min — ABNORMAL LOW (ref >90)
Glucose, Bld: 126 mg/dL — ABNORMAL HIGH (ref 70–99)
Potassium: 3.5 mEq/L (ref 3.5–5.1)
Sodium: 137 mEq/L (ref 135–145)
Total Bilirubin: 0.5 mg/dL (ref 0.3–1.2)
Total Protein: 7.6 g/dL (ref 6.0–8.3)

## 2012-06-30 LAB — CBC WITH DIFFERENTIAL/PLATELET
Basophils Absolute: 0 10*3/uL (ref 0.0–0.1)
Basophils Relative: 0 % (ref 0–1)
Eosinophils Absolute: 0 10*3/uL (ref 0.0–0.7)
Eosinophils Relative: 1 % (ref 0–5)
HCT: 36.9 % (ref 36.0–46.0)
Hemoglobin: 12 g/dL (ref 12.0–15.0)
Lymphocytes Relative: 13 % (ref 12–46)
Lymphs Abs: 1.2 10*3/uL (ref 0.7–4.0)
MCH: 23.2 pg — ABNORMAL LOW (ref 26.0–34.0)
MCHC: 32.5 g/dL (ref 30.0–36.0)
MCV: 71.2 fL — ABNORMAL LOW (ref 78.0–100.0)
Monocytes Absolute: 0.7 10*3/uL (ref 0.1–1.0)
Monocytes Relative: 8 % (ref 3–12)
Neutro Abs: 6.8 10*3/uL (ref 1.7–7.7)
Neutrophils Relative %: 78 % — ABNORMAL HIGH (ref 43–77)
Platelets: 284 10*3/uL (ref 150–400)
RBC: 5.18 MIL/uL — ABNORMAL HIGH (ref 3.87–5.11)
RDW: 16.4 % — ABNORMAL HIGH (ref 11.5–15.5)
WBC: 8.7 10*3/uL (ref 4.0–10.5)

## 2012-06-30 LAB — URINALYSIS, ROUTINE W REFLEX MICROSCOPIC
Bilirubin Urine: NEGATIVE
Glucose, UA: NEGATIVE mg/dL
Ketones, ur: NEGATIVE mg/dL
Leukocytes, UA: NEGATIVE
Nitrite: NEGATIVE
Protein, ur: NEGATIVE mg/dL
Specific Gravity, Urine: 1.01 (ref 1.005–1.030)
Urobilinogen, UA: 1 mg/dL (ref 0.0–1.0)
pH: 7 (ref 5.0–8.0)

## 2012-06-30 LAB — TSH: TSH: 3.296 u[IU]/mL (ref 0.350–4.500)

## 2012-06-30 LAB — LIPASE, BLOOD: Lipase: 17 U/L (ref 11–59)

## 2012-06-30 LAB — LACTIC ACID, PLASMA: Lactic Acid, Venous: 3 mmol/L — ABNORMAL HIGH (ref 0.5–2.2)

## 2012-06-30 LAB — TROPONIN I: Troponin I: <0.3 ng/mL (ref <0.30)

## 2012-06-30 LAB — PROCALCITONIN: Procalcitonin: <0.1 ng/mL

## 2012-06-30 MED ORDER — LEVOFLOXACIN IN D5W 750 MG/150ML IV SOLN
750.0000 mg | Freq: Once | INTRAVENOUS | Status: AC
  Administered 2012-06-30: 750 mg via INTRAVENOUS
  Filled 2012-06-30: qty 150

## 2012-06-30 MED ORDER — ONDANSETRON HCL 4 MG PO TABS: 4.0000 mg | ORAL_TABLET | Freq: Four times a day (QID) | ORAL | Status: DC | PRN

## 2012-06-30 MED ORDER — ENOXAPARIN SODIUM 40 MG/0.4ML ~~LOC~~ SOLN
40.0000 mg | SUBCUTANEOUS | Status: DC
  Administered 2012-06-30 – 2012-07-01 (×2): 40 mg via SUBCUTANEOUS
  Filled 2012-06-30 (×2): qty 0.4

## 2012-06-30 MED ORDER — ASPIRIN EC 81 MG PO TBEC
81.0000 mg | DELAYED_RELEASE_TABLET | ORAL | Status: DC
  Administered 2012-07-01 – 2012-07-02 (×2): 81 mg via ORAL
  Filled 2012-06-30 (×2): qty 1

## 2012-06-30 MED ORDER — BENZTROPINE MESYLATE 1 MG PO TABS
1.0000 mg | ORAL_TABLET | Freq: Every day | ORAL | Status: DC
  Administered 2012-06-30 – 2012-07-02 (×3): 1 mg via ORAL
  Filled 2012-06-30 (×3): qty 1

## 2012-06-30 MED ORDER — SODIUM CHLORIDE 0.9 % IJ SOLN
INTRAMUSCULAR | Status: AC
  Administered 2012-06-30: 10 mL
  Filled 2012-06-30: qty 3

## 2012-06-30 MED ORDER — ALPRAZOLAM 0.5 MG PO TABS
0.5000 mg | ORAL_TABLET | Freq: Three times a day (TID) | ORAL | Status: DC | PRN
  Administered 2012-07-01: 0.5 mg via ORAL
  Filled 2012-06-30: qty 1

## 2012-06-30 MED ORDER — HALOPERIDOL 5 MG PO TABS
6.0000 mg | ORAL_TABLET | Freq: Two times a day (BID) | ORAL | Status: DC
  Filled 2012-06-30 (×2): qty 1

## 2012-06-30 MED ORDER — HALOPERIDOL 2 MG PO TABS
6.0000 mg | ORAL_TABLET | Freq: Two times a day (BID) | ORAL | Status: DC
  Administered 2012-06-30 – 2012-07-01 (×2): 6 mg via ORAL
  Filled 2012-06-30: qty 3

## 2012-06-30 MED ORDER — CITALOPRAM HYDROBROMIDE 20 MG PO TABS
20.0000 mg | ORAL_TABLET | ORAL | Status: DC
  Administered 2012-07-01 – 2012-07-02 (×2): 20 mg via ORAL
  Filled 2012-06-30 (×2): qty 1

## 2012-06-30 MED ORDER — ACETAMINOPHEN 500 MG PO TABS
500.0000 mg | ORAL_TABLET | Freq: Four times a day (QID) | ORAL | Status: DC | PRN
  Administered 2012-06-30 – 2012-07-02 (×3): 500 mg via ORAL
  Filled 2012-06-30 (×3): qty 1

## 2012-06-30 MED ORDER — RIVASTIGMINE 9.5 MG/24HR TD PT24
9.5000 mg | MEDICATED_PATCH | Freq: Every day | TRANSDERMAL | Status: DC
  Administered 2012-06-30: 9.5 mg via TRANSDERMAL
  Filled 2012-06-30 (×4): qty 1

## 2012-06-30 MED ORDER — SODIUM CHLORIDE 0.9 % IV BOLUS (SEPSIS)
500.0000 mL | Freq: Once | INTRAVENOUS | Status: AC
  Administered 2012-06-30: 500 mL via INTRAVENOUS

## 2012-06-30 MED ORDER — HALOPERIDOL LACTATE 2 MG/ML PO CONC
6.0000 mg | Freq: Two times a day (BID) | ORAL | Status: DC
  Filled 2012-06-30 (×2): qty 3

## 2012-06-30 MED ORDER — DOCUSATE SODIUM 100 MG PO CAPS
100.0000 mg | ORAL_CAPSULE | Freq: Two times a day (BID) | ORAL | Status: DC
  Administered 2012-06-30 – 2012-07-02 (×4): 100 mg via ORAL
  Filled 2012-06-30 (×5): qty 1

## 2012-06-30 MED ORDER — LEVOFLOXACIN IN D5W 750 MG/150ML IV SOLN: 750.0000 mg | INTRAVENOUS | Status: DC

## 2012-06-30 MED ORDER — ALBUTEROL SULFATE (5 MG/ML) 0.5% IN NEBU: 2.5000 mg | INHALATION_SOLUTION | RESPIRATORY_TRACT | Status: DC | PRN

## 2012-06-30 MED ORDER — SENNOSIDES-DOCUSATE SODIUM 8.6-50 MG PO TABS: 1.0000 | ORAL_TABLET | Freq: Every evening | ORAL | Status: DC | PRN

## 2012-06-30 MED ORDER — ZOLPIDEM TARTRATE 5 MG PO TABS
5.0000 mg | ORAL_TABLET | Freq: Every day | ORAL | Status: DC
  Administered 2012-06-30 – 2012-07-01 (×2): 5 mg via ORAL
  Filled 2012-06-30 (×2): qty 1

## 2012-06-30 MED ORDER — SODIUM CHLORIDE 0.9 % IV SOLN
INTRAVENOUS | Status: DC
  Administered 2012-06-30: 13:00:00 via INTRAVENOUS
  Administered 2012-07-01: 1000 mL via INTRAVENOUS

## 2012-06-30 MED ORDER — ONDANSETRON HCL 4 MG/2ML IJ SOLN: 4.0000 mg | Freq: Four times a day (QID) | INTRAMUSCULAR | Status: DC | PRN

## 2012-06-30 MED ORDER — METOPROLOL TARTRATE 25 MG PO TABS
25.0000 mg | ORAL_TABLET | ORAL | Status: DC
  Administered 2012-07-01: 25 mg via ORAL
  Filled 2012-06-30: qty 1

## 2012-06-30 NOTE — ED Notes (Signed)
hospitalist to bedside

## 2012-06-30 NOTE — ED Notes (Signed)
Pt c/o generalized cp. Family reports dizziness/weakness x 4 days.

## 2012-06-30 NOTE — H&P (Addendum)
Hospital Admission Note Date: 06/30/2012  Patient name: [name] Medical record number: [id number] Date of birth: [birth year] Age: 78 y.o. Gender: female PCP: Evlyn Courier, MD  Attending physician: Laray Anger, DO  Chief Complaint: weakness  History of Present Illness:  IVRY Hampton is an 78 y.o. female who was brought to the emergency room by family members with weakness. They are currently gone, so all history is per ED physician who spoke with him earlier today. Over the past several days, they have been unable to get her up standing. Usually she is able to ambulate with a walker. Workup was significant for a possible infiltrate at the right base. She is slightly hyperthermic with a temperature of 96.9 rectal. She has a normal white blood cell count. Urinalysis essentially negative. Lactate 3.0  Past Medical History  Diagnosis Date  . Hypertension   . Cancer   . Alzheimer's dementia   . Heart attack   . Parkinson disease     Meds: See medicine reconciliation  Allergies: Penicillins  Social history: Patient reportedly lives with family members. No known history of tobacco or alcohol use. CODE STATUS unknown.  Family history: Unable to obtain due to dementia.  Past Surgical History  Procedure Date  . Breast surgery   . Abdominal hysterectomy   . Mastectomy   . Small intestine removed   . Tonsillectomy   . Cholecystectomy   . Colon surgery    Review of Systems: Unable to obtain due to dementia.  Physical Exam: Blood pressure 158/93, pulse 76, temperature 96.9 F (36.1 C), temperature source Rectal, resp. rate 21, weight 58.968 kg (130 lb), SpO2 100.00%. BP 158/93  Pulse 76  Temp 96.9 F (36.1 C) (Rectal)  Resp 21  Wt 58.968 kg (130 lb)  SpO2 100%  General Appearance:    Alert, cooperative, confused. Unable to answer questions appropriately. Tremor noted in her jaw, all extremities   Head:    Normocephalic, without obvious abnormality, atraumatic    Eyes:    PERRL, conjunctiva/corneas clear, EOM's intact, fundi    benign, both eyes  Ears:    Normal TM's and external ear canals, both ears  Nose:   Nares normal, septum midline, mucosa normal, no drainage    or sinus tenderness  Throat:   Lips, mucosa, and tongue normal; teeth and gums normal  Neck:   Supple, symmetrical, trachea midline, no adenopathy;    thyroid:  no enlargement/tenderness/nodules; no carotid   bruit or JVD  Back:     Symmetric, no curvature, ROM normal, no CVA tenderness  Lungs:     Clear to auscultation bilaterally, respirations unlabored  Chest Wall:    No tenderness or deformity   Heart:    Regular rate and rhythm, S1 and S2 normal, no murmur, rub   or gallop  Breast Exam:    deferred   Abdomen:     Soft, non-tender, bowel sounds active all four quadrants,    no masses, no organomegaly  Genitalia:   deferred   Rectal:   deferred   Extremities:   Extremities normal, atraumatic, no cyanosis or edema  Pulses:   2+ and symmetric all extremities  Skin:   Skin color, texture, turgor normal, no rashes or lesions  Lymph nodes:   Cervical, supraclavicular, and axillary nodes normal  Neurologic:   CNII-XII intact, normal strength, sensation and reflexes    Throughout.tremor of the jaw, hands, feet      Psychiatric: Pleasantly confused.  Lab  results: Basic Metabolic Panel:  Basename 06/30/12 1220  NA 137  K 3.5  CL 98  CO2 30  GLUCOSE 126*  BUN 8  CREATININE 0.69  CALCIUM 9.7  MG --  PHOS --   Liver Function Tests:  Basename 06/30/12 1220  AST 14  ALT 9  ALKPHOS 75  BILITOT 0.5  PROT 7.6  ALBUMIN 3.1*    Basename 06/30/12 1220  LIPASE 17  AMYLASE --   No results found for this basename: AMMONIA:2 in the last 72 hours CBC:  Basename 06/30/12 1220  WBC 8.7  NEUTROABS 6.8  HGB 12.0  HCT 36.9  MCV 71.2*  PLT 284   Cardiac Enzymes:  Basename 06/30/12 1220  CKTOTAL --  CKMB --  CKMBINDEX --  TROPONINI <0.30   Urinalysis:  Basename  06/30/12 1150  COLORURINE YELLOW  LABSPEC 1.010  PHURINE 7.0  GLUCOSEU NEGATIVE  HGBUR SMALL*  BILIRUBINUR NEGATIVE  KETONESUR NEGATIVE  PROTEINUR NEGATIVE  UROBILINOGEN 1.0  NITRITE NEGATIVE  LEUKOCYTESUR NEGATIVE   Imaging results:  Dg Chest 1 View  06/30/2012  *RADIOLOGY REPORT*  Clinical Data: Cough.  Infiltrates.  CHF.  Free air.  CHEST - 1 VIEW  Comparison: 02/16/2011.  Findings: Low volume chest.  Patchy density is present at the right lung base which may represent atelectasis or airspace disease.  No free air underneath the hemidiaphragms identified.  Low volumes accentuate the size of the cardiopericardial silhouette.  Tortuous thoracic aorta with aortic atherosclerosis.  Left lung apex excluded from view.  IMPRESSION: Low volume chest with patchy right basilar density which may represent airspace disease or atelectasis.   Original Report Authenticated By: Andreas Newport, M.D.    Ct Head Wo Contrast  06/30/2012  *RADIOLOGY REPORT*  Clinical Data: Dizziness and weakness.  CT HEAD WITHOUT CONTRAST  Technique:  Contiguous axial images were obtained from the base of the skull through the vertex without contrast.  Comparison: 02/16/2011  Findings: Motion artifact limits the study.  No obvious mass effect, midline shift, or acute intracranial hemorrhage.  Mastoid air cells and visualized paranasal sinuses are clear.  Cranium is intact.  Global atrophy and chronic ischemic changes are not significantly changed.  IMPRESSION: No acute intracranial pathology.   Original Report Authenticated By: Donavan Burnet, M.D.     Assessment & Plan: Principal Problem:  *Community acquired pneumonia Active Problems:  Weakness generalized  Dementia  We'll need to get more history from family members when they arrived, particularly CODE STATUS. Patient has received a dose of levofloxacin which Samantha will continue. Samantha will get a physical therapy consult. Resume outpatient medications. Admit to  MedSurg.  Evamarie Raetz L 06/30/2012, 3:39 PM

## 2012-06-30 NOTE — ED Provider Notes (Signed)
History     CSN: [id number]  Arrival date & time 06/30/12  1108   First MD Initiated Contact with Patient 06/30/12 1157      Chief Complaint  Patient presents with  . Dizziness  . Weakness     Patient is a 78 y.o. female presenting with weakness. The history is provided by a caregiver and a relative. History Limited By: Hx dementia.  Weakness  Pt was seen at 1205.  Per pt's family, c/o pt with gradual onset and worsening of constant generalized weakness for the past 4 days.  Pt has been progressively unable to walk and now stand over the past 4 days.  Pt usually walks with her walker.  Pt lives at home with family.  Pt has hx dementia.  Family denies pt has had fever, no N/V/D, no cough/SOB.    Past Medical History  Diagnosis Date  . Hypertension   . Cancer   . Alzheimer's dementia   . Heart attack   . Parkinson disease     Past Surgical History  Procedure Date  . Breast surgery   . Abdominal hysterectomy   . Mastectomy   . Small intestine removed   . Tonsillectomy   . Cholecystectomy   . Colon surgery     History  Substance Use Topics  . Smoking status: Never Smoker   . Smokeless tobacco: Not on file  . Alcohol Use: No      Review of Systems  Unable to perform ROS: Dementia    Allergies  Penicillins  Home Medications   Current Outpatient Rx  Name Route Sig Dispense Refill  . ACETAMINOPHEN 500 MG PO TABS Oral Take 500 mg by mouth every 6 (six) hours as needed. For pain    . ALPRAZOLAM 0.5 MG PO TABS Oral Take 0.5 mg by mouth 3 (three) times daily as needed. For anxiety    . ASPIRIN EC 81 MG PO TBEC Oral Take 81 mg by mouth every morning.    Marland Kitchen BENZTROPINE MESYLATE 1 MG PO TABS Oral Take 1 mg by mouth daily. At 1400 (2pm)    . BUMETANIDE 0.5 MG PO TABS Oral Take 0.5 mg by mouth every morning.    Marland Kitchen CITALOPRAM HYDROBROMIDE 20 MG PO TABS Oral Take 20 mg by mouth every morning.    Marland Kitchen DICLOFENAC SODIUM 1 % TD GEL Topical Apply topically as needed. For arm  pain    . HALOPERIDOL LACTATE 2 MG/ML PO CONC Oral Take 6 mg by mouth 2 (two) times daily.    . IBUPROFEN 200 MG PO TABS Oral Take 400-800 mg by mouth as needed.    Marland Kitchen METOPROLOL TARTRATE 50 MG PO TABS Oral Take 25 mg by mouth every morning.    Marland Kitchen RIVASTIGMINE 9.5 MG/24HR TD PT24 Transdermal Place 1 patch onto the skin daily. *Remove and discard used patches*    . ZOLPIDEM TARTRATE 10 MG PO TABS Oral Take 10 mg by mouth at bedtime.      BP 158/93  Pulse 76  Temp 96.9 F (36.1 C) (Rectal)  Resp 21  Wt 130 lb (58.968 kg)  SpO2 100%  Physical Exam 1210: Physical examination:  Nursing notes reviewed; Vital signs and O2 SAT reviewed;  Constitutional: Well developed, Well nourished, In no acute distress; Head:  Normocephalic, atraumatic; Eyes: EOMI, PERRL, No scleral icterus; ENMT: Mouth and pharynx normal, Mucous membranes dry; Neck: Supple, Full range of motion, No lymphadenopathy; Cardiovascular: Regular rate and rhythm, No gallop; Respiratory: Breath  sounds coarse & equal bilaterally, No wheezes.  Speaking full sentences with ease, Normal respiratory effort/excursion; Chest: Nontender, Movement normal; Abdomen: Soft, Nontender, Nondistended, Normal bowel sounds; Extremities: Pulses normal, No tenderness, +tr pedal edema bilat without calf asymmetry.; Neuro: Awake, alert, confused re: time, place, events. Major CN grossly intact.  Speech clear. +bilat UE's tremor per hx. Moves all ext on stretcher without apparent gross focal motor deficits.; Skin: Color normal, Warm, Dry.   ED Course  Procedures    MDM  MDM Reviewed: nursing note, vitals and previous chart Reviewed previous: labs and ECG Interpretation: labs, ECG, x-ray and CT scan      Date: 06/30/2012  Rate: 83  Rhythm: normal sinus rhythm  QRS Axis: normal  Intervals: normal  ST/T Wave abnormalities: normal  Conduction Disutrbances:none  Narrative Interpretation:   Old EKG Reviewed: unchanged; no significant changes from  previous EKG dated 02/16/2011.  Results for orders placed during the hospital encounter of 06/30/12  URINALYSIS, ROUTINE W REFLEX MICROSCOPIC      Component Value Range   Color, Urine YELLOW  YELLOW   APPearance CLEAR  CLEAR   Specific Gravity, Urine 1.010  1.005 - 1.030   pH 7.0  5.0 - 8.0   Glucose, UA NEGATIVE  NEGATIVE mg/dL   Hgb urine dipstick SMALL (*) NEGATIVE   Bilirubin Urine NEGATIVE  NEGATIVE   Ketones, ur NEGATIVE  NEGATIVE mg/dL   Protein, ur NEGATIVE  NEGATIVE mg/dL   Urobilinogen, UA 1.0  0.0 - 1.0 mg/dL   Nitrite NEGATIVE  NEGATIVE   Leukocytes, UA NEGATIVE  NEGATIVE  CBC WITH DIFFERENTIAL      Component Value Range   WBC 8.7  4.0 - 10.5 K/uL   RBC 5.18 (*) 3.87 - 5.11 MIL/uL   Hemoglobin 12.0  12.0 - 15.0 g/dL   HCT 16.1  09.6 - 04.5 %   MCV 71.2 (*) 78.0 - 100.0 fL   MCH 23.2 (*) 26.0 - 34.0 pg   MCHC 32.5  30.0 - 36.0 g/dL   RDW 40.9 (*) 81.1 - 91.4 %   Platelets 284  150 - 400 K/uL   Neutrophils Relative 78 (*) 43 - 77 %   Neutro Abs 6.8  1.7 - 7.7 K/uL   Lymphocytes Relative 13  12 - 46 %   Lymphs Abs 1.2  0.7 - 4.0 K/uL   Monocytes Relative 8  3 - 12 %   Monocytes Absolute 0.7  0.1 - 1.0 K/uL   Eosinophils Relative 1  0 - 5 %   Eosinophils Absolute 0.0  0.0 - 0.7 K/uL   Basophils Relative 0  0 - 1 %   Basophils Absolute 0.0  0.0 - 0.1 K/uL  TROPONIN I      Component Value Range   Troponin I <0.30  <0.30 ng/mL  LACTIC ACID, PLASMA      Component Value Range   Lactic Acid, Venous 3.0 (*) 0.5 - 2.2 mmol/L  COMPREHENSIVE METABOLIC PANEL      Component Value Range   Sodium 137  135 - 145 mEq/L   Potassium 3.5  3.5 - 5.1 mEq/L   Chloride 98  96 - 112 mEq/L   CO2 30  19 - 32 mEq/L   Glucose, Bld 126 (*) 70 - 99 mg/dL   BUN 8  6 - 23 mg/dL   Creatinine, Ser 7.82  0.50 - 1.10 mg/dL   Calcium 9.7  8.4 - 95.6 mg/dL   Total Protein 7.6  6.0 -  8.3 g/dL   Albumin 3.1 (*) 3.5 - 5.2 g/dL   AST 14  0 - 37 U/L   ALT 9  0 - 35 U/L   Alkaline Phosphatase 75   39 - 117 U/L   Total Bilirubin 0.5  0.3 - 1.2 mg/dL   GFR calc non Af Amer 74 (*) >90 mL/min   GFR calc Af Amer 86 (*) >90 mL/min  LIPASE, BLOOD      Component Value Range   Lipase 17  11 - 59 U/L  URINE MICROSCOPIC-ADD ON      Component Value Range   Squamous Epithelial / LPF RARE  RARE   WBC, UA 0-2  <3 WBC/hpf   RBC / HPF 0-2  <3 RBC/hpf   Dg Chest 1 View 06/30/2012  *RADIOLOGY REPORT*  Clinical Data: Cough.  Infiltrates.  CHF.  Free air.  CHEST - 1 VIEW  Comparison: 02/16/2011.  Findings: Low volume chest.  Patchy density is present at the right lung base which may represent atelectasis or airspace disease.  No free air underneath the hemidiaphragms identified.  Low volumes accentuate the size of the cardiopericardial silhouette.  Tortuous thoracic aorta with aortic atherosclerosis.  Left lung apex excluded from view.  IMPRESSION: Low volume chest with patchy right basilar density which may represent airspace disease or atelectasis.   Original Report Authenticated By: Andreas Newport, M.D.    Ct Head Wo Contrast 06/30/2012  *RADIOLOGY REPORT*  Clinical Data: Dizziness and weakness.  CT HEAD WITHOUT CONTRAST  Technique:  Contiguous axial images were obtained from the base of the skull through the vertex without contrast.  Comparison: 02/16/2011  Findings: Motion artifact limits the study.  No obvious mass effect, midline shift, or acute intracranial hemorrhage.  Mastoid air cells and visualized paranasal sinuses are clear.  Cranium is intact.  Global atrophy and chronic ischemic changes are not significantly changed.  IMPRESSION: No acute intracranial pathology.   Original Report Authenticated By: Donavan Burnet, M.D.      1505:  Dx and testing d/w pt's family.  Questions answered.  Verb understanding, agreeable to admit.  Pt unable to stand at bedside with assistance of ED staff due to generalized weakness.  Will start IV levaquin for CAP.  T/C to [facility]. Lendell Caprice, case discussed,  including:  HPI, pertinent PM/SHx, VS/PE, dx testing, ED course and treatment:  Agreeable to admit, requests to obtain medical bed to team 1.          Laray Anger, DO 07/02/12 2121

## 2012-07-01 MED ORDER — HALOPERIDOL 5 MG PO TABS
6.0000 mg | ORAL_TABLET | Freq: Two times a day (BID) | ORAL | Status: DC
  Filled 2012-07-01 (×4): qty 1

## 2012-07-01 MED ORDER — METOPROLOL TARTRATE 25 MG PO TABS
25.0000 mg | ORAL_TABLET | Freq: Two times a day (BID) | ORAL | Status: DC
  Administered 2012-07-01 – 2012-07-02 (×2): 25 mg via ORAL
  Filled 2012-07-01 (×2): qty 1

## 2012-07-01 MED ORDER — HALOPERIDOL LACTATE 5 MG/ML IJ SOLN
6.0000 mg | Freq: Two times a day (BID) | INTRAMUSCULAR | Status: DC
  Administered 2012-07-01: 6 mg via INTRAMUSCULAR

## 2012-07-01 MED ORDER — HALOPERIDOL 5 MG PO TABS: 6.0000 mg | ORAL_TABLET | Freq: Two times a day (BID) | ORAL | Status: DC

## 2012-07-01 MED ORDER — LEVOFLOXACIN 500 MG PO TABS
750.0000 mg | ORAL_TABLET | Freq: Every day | ORAL | Status: DC
  Administered 2012-07-01 – 2012-07-02 (×2): 750 mg via ORAL
  Filled 2012-07-01 (×2): qty 1

## 2012-07-01 MED ORDER — RIVASTIGMINE 9.5 MG/24HR TD PT24
9.5000 mg | MEDICATED_PATCH | Freq: Every day | TRANSDERMAL | Status: DC
  Administered 2012-07-01: 9.5 mg via TRANSDERMAL
  Filled 2012-07-01 (×2): qty 1

## 2012-07-01 NOTE — Progress Notes (Signed)
UR Chart Review Completed  

## 2012-07-01 NOTE — Progress Notes (Signed)
Patients IV infiltrated - swollen and slightly red around area.  IV removed and heat pack placed.  MD made aware that patient was stuck several times during night with no success.  MD will assess patient for IV needs.  No IV in at this time.

## 2012-07-01 NOTE — Care Management Note (Signed)
    Page 1 of 1   07/02/2012     11:15:50 AM   CARE MANAGEMENT NOTE 07/02/2012  Patient:  [name], [name]   Account Number:  [id number]  Date Initiated:  07/01/2012  Documentation initiated by:  Rosemary Holms  Subjective/Objective Assessment:   Pt admitted from home where her daughter lives with her. Her daughter states she "is a walker" and can out walk her. She feels her mother will need HH PT at DC.     Action/Plan:   Home with [facility] PT if agreed by MD   Anticipated DC Date:  07/02/2012   Anticipated DC Plan:  HOME W HOME HEALTH SERVICES      DC Planning Services  CM consult      Choice offered to / List presented to:          [facility] arranged  HH-2 PT  HH-1 RN      [facility] agency  [facility]   Status of service:  Completed, signed off Medicare Important Message given?   (If response is "NO", the following Medicare IM given date fields will be blank) Date Medicare IM given:   Date Additional Medicare IM given:    Discharge Disposition:  HOME W HOME HEALTH SERVICES  Per UR Regulation:    If discussed at Long Length of Stay Meetings, dates discussed:    Comments:  07/01/12 1430 Camber Ninh Leanord Hawking RN BSN CM

## 2012-07-01 NOTE — Progress Notes (Signed)
Subjective: This 78 year old lady with, who has long-standing dementia her for over 10 years presents with failure to thrive, failure in many activities of daily living. Prior to this change, which occurred in the last 3-4 weeks, she was able to walk independently, was able to dress herself and feed herself. In the last week or so especially, she has not been able to feed herself either. I spoke with her daughter at the bedside. His daughter is the health care power of attorney and has been living with her for the last 4 years. She has seen a clear decline in the last 4 years, typical of dementia.           Physical Exam: Blood pressure 145/71, pulse 103, temperature 98.4 F (36.9 C), temperature source Oral, resp. rate 18, height 5\' 5"  (1.651 m), weight 55.475 kg (122 lb 4.8 oz), SpO2 98.00%. She currently looks systemically well. She is not toxic or septic. Heart sounds are present and normal. Lung fields are clinically clear. She is not very communicative. There are no obvious localizing signs neurologically.   Investigations:  No results found for this or any previous visit (from the past 240 hour(s)).   Basic Metabolic Panel:  Basename 06/30/12 1220  NA 137  K 3.5  CL 98  CO2 30  GLUCOSE 126*  BUN 8  CREATININE 0.69  CALCIUM 9.7  MG --  PHOS --   Liver Function Tests:  Basename 06/30/12 1220  AST 14  ALT 9  ALKPHOS 75  BILITOT 0.5  PROT 7.6  ALBUMIN 3.1*     CBC:  Basename 06/30/12 1220  WBC 8.7  NEUTROABS 6.8  HGB 12.0  HCT 36.9  MCV 71.2*  PLT 284    Dg Chest 1 View  06/30/2012  *RADIOLOGY REPORT*  Clinical Data: Cough.  Infiltrates.  CHF.  Free air.  CHEST - 1 VIEW  Comparison: 02/16/2011.  Findings: Low volume chest.  Patchy density is present at the right lung base which may represent atelectasis or airspace disease.  No free air underneath the hemidiaphragms identified.  Low volumes accentuate the size of the cardiopericardial  silhouette.  Tortuous thoracic aorta with aortic atherosclerosis.  Left lung apex excluded from view.  IMPRESSION: Low volume chest with patchy right basilar density which may represent airspace disease or atelectasis.   Original Report Authenticated By: Andreas Newport, M.D.    Ct Head Wo Contrast  06/30/2012  *RADIOLOGY REPORT*  Clinical Data: Dizziness and weakness.  CT HEAD WITHOUT CONTRAST  Technique:  Contiguous axial images were obtained from the base of the skull through the vertex without contrast.  Comparison: 02/16/2011  Findings: Motion artifact limits the study.  No obvious mass effect, midline shift, or acute intracranial hemorrhage.  Mastoid air cells and visualized paranasal sinuses are clear.  Cranium is intact.  Global atrophy and chronic ischemic changes are not significantly changed.  IMPRESSION: No acute intracranial pathology.   Original Report Authenticated By: Donavan Burnet, M.D.       Medications: I have reviewed the patient's current medications.  Impression: 1. Probable community-acquired pneumonia. 2. Progressive decline secondary to dementia.     Plan: 1. Continue with current therapy, we can switch to oral antibiotics as she is afebrile, does not have an increased white count. 2. Encourage oral intake. 3. Await assessment from physical therapy. 4. DO NOT RESUSCITATE. This has been discussed and agreed with patient's daughter.     LOS: 1 day  Wilson Singer Pager [phone]  07/01/2012, 10:23 AM

## 2012-07-01 NOTE — Evaluation (Signed)
Physical Therapy Evaluation Patient Details Name: [name] MRN: [id number] DOB: 07/11/1920 Today's Date: 07/01/2012 Time: 4098-1191 PT Time Calculation (min): 28 min  PT Assessment / Plan / Recommendation Clinical Impression  Daughter states that mother is normally very stable on her feet.  Pt currently needs min assist for stability.  Attempted to educate pt in ambulation with rollling walker but due to  Alzheimer's pt is safer with hand hold assist and pt's daughter states that she will be with her mother 24/7.  Pt will benefit from skilled PT while in the hospital to improve stability of ambulation.    PT Assessment  Patient needs continued PT services    Follow Up Recommendations  No PT follow up    Does the patient have the potential to tolerate intense rehabilitation    n/a  Barriers to Discharge None      Equipment Recommendations  None recommended by PT    Recommendations for Other Services   none  Frequency Min 3X/week    Precautions / Restrictions   falls  Pertinent Vitals/Pain 0/10      Mobility  Bed Mobility Bed Mobility: Supine to Sit Supine to Sit: 3: Mod assist Transfers Transfers: Sit to Stand Sit to Stand: 5: Supervision Ambulation/Gait Ambulation/Gait Assistance: 4: Min assist Ambulation Distance (Feet): 80 Feet Assistive device: None       Exercises General Exercises - Lower Extremity Hip Flexion/Marching:  (sit to stand x 5 reps.)   PT Diagnosis: Difficulty walking  PT Problem List: Decreased strength;Decreased activity tolerance;Decreased balance PT Treatment Interventions: Gait training;Therapeutic activities;Therapeutic exercise   PT Goals Acute Rehab PT Goals PT Goal Formulation: With patient/family Time For Goal Achievement: 07/04/12 Potential to Achieve Goals: Good Pt will go Supine/Side to Sit: with min assist PT Goal: Supine/Side to Sit - Progress: Goal set today Pt will Ambulate: 51 - 150 feet;with supervision PT Goal:  Ambulate - Progress: Goal set today  Visit Information  Last PT Received On: 07/01/12    Subjective Data  Subjective: Daughter states that pt has Alzheimers.  Pt normally walks all over the house with no assistive device and no difficulties Patient Stated Goal: daughter states that she will be with her mother at home         Extremity/Trunk Assessment Right Lower Extremity Assessment RLE ROM/Strength/Tone: [facility] for tasks assessed Left Lower Extremity Assessment LLE ROM/Strength/Tone: [facility] for tasks assessed   Balance    End of Session PT - End of Session Equipment Utilized During Treatment: Gait belt Activity Tolerance: Patient tolerated treatment well Patient left: in chair;with call bell/phone within reach;with family/visitor present  GP     Jammy Plotkin,CINDY 07/01/2012, 10:24 AM

## 2012-07-02 LAB — URINE CULTURE
Colony Count: NO GROWTH
Culture: NO GROWTH

## 2012-07-02 LAB — COMPREHENSIVE METABOLIC PANEL
ALT: 6 U/L (ref 0–35)
AST: 23 U/L (ref 0–37)
Albumin: 2.5 g/dL — ABNORMAL LOW (ref 3.5–5.2)
Alkaline Phosphatase: 60 U/L (ref 39–117)
BUN: 9 mg/dL (ref 6–23)
CO2: 27 mEq/L (ref 19–32)
Calcium: 9.1 mg/dL (ref 8.4–10.5)
Chloride: 104 mEq/L (ref 96–112)
Creatinine, Ser: 0.85 mg/dL (ref 0.50–1.10)
GFR calc Af Amer: 68 mL/min — ABNORMAL LOW (ref >90)
GFR calc non Af Amer: 59 mL/min — ABNORMAL LOW (ref >90)
Glucose, Bld: 91 mg/dL (ref 70–99)
Potassium: 3.5 mEq/L (ref 3.5–5.1)
Sodium: 137 mEq/L (ref 135–145)
Total Bilirubin: 0.4 mg/dL (ref 0.3–1.2)
Total Protein: 6.5 g/dL (ref 6.0–8.3)

## 2012-07-02 LAB — CBC
HCT: 32.3 % — ABNORMAL LOW (ref 36.0–46.0)
Hemoglobin: 10.4 g/dL — ABNORMAL LOW (ref 12.0–15.0)
MCH: 22.9 pg — ABNORMAL LOW (ref 26.0–34.0)
MCHC: 32.2 g/dL (ref 30.0–36.0)
MCV: 71.1 fL — ABNORMAL LOW (ref 78.0–100.0)
Platelets: 288 10*3/uL (ref 150–400)
RBC: 4.54 MIL/uL (ref 3.87–5.11)
RDW: 16.3 % — ABNORMAL HIGH (ref 11.5–15.5)
WBC: 6.6 10*3/uL (ref 4.0–10.5)

## 2012-07-02 MED ORDER — HALOPERIDOL 2 MG PO TABS
6.0000 mg | ORAL_TABLET | Freq: Two times a day (BID) | ORAL | Status: DC
  Administered 2012-07-02: 6 mg via ORAL
  Filled 2012-07-02 (×4): qty 3

## 2012-07-02 MED ORDER — LEVOFLOXACIN 750 MG PO TABS: 750.0000 mg | ORAL_TABLET | Freq: Every day | ORAL | Status: DC

## 2012-07-02 NOTE — Discharge Summary (Signed)
Physician Discharge Summary  [name] AVW:[id number] DOB: 1919-10-25 DOA: 06/30/2012  PCP: Evlyn Courier, MD  Admit date: 06/30/2012 Discharge date: 07/02/2012  Recommendations for Outpatient Follow-up:  1. Follow with primary care physician in the next 4-6 weeks. 2. Followup on microcytic anemia, may need outpatient workup although with moderate to severe dementia, I would not be particularly aggressive.  3. Home health physical therapy and RN.  Discharge Diagnoses:  1. Community-acquired pneumonia with right lower lobe pneumonia. 2. Moderate to severe dementia. 3. Microcytic anemia, will need further outpatient investigation if continues to be an issue.   Discharge Condition: Stable and improved.  Diet recommendation: Regular.  Filed Weights   06/30/12 1120 06/30/12 1638 07/01/12 0430  Weight: 58.968 kg (130 lb) 54.885 kg (121 lb) 55.475 kg (122 lb 4.8 oz)    History of present illness:  This 78 year old lady presented to the hospital with symptoms of weakness. Please see initial history as outlined below: [name] is an 78 y.o. female who was brought to the emergency room by family members with weakness. They are currently gone, so all history is per ED physician who spoke with him earlier today. Over the past several days, they have been unable to get her up standing. Usually she is able to ambulate with a walker. Workup was significant for a possible infiltrate at the right base. She is slightly hyperthermic with a temperature of 96.9 rectal. She has a normal white blood cell count. Urinalysis essentially negative. Lactate 3.0  Hospital Course:  Patient was admitted and started on intravenous antibiotics and intravenous fluids. Significant improvement with this. She has been afebrile. She has been seen by physical therapy and did well. I think she would benefit from home health physical therapy. She does have moderate to severe dementia for daughters history. The patient  was a DO NOT RESUSCITATE in the hospital. The daughter understands that as the patient's dementia progressively gets worse, there may be more infections and other issues. This morning, the patient is much improved and almost back to her baseline, per the daughter's observation.  Procedures:  None.   Consultations:  None.  Discharge Exam: Filed Vitals:   07/01/12 1422 07/01/12 2100 07/02/12 0500 07/02/12 0802  BP: 156/103 155/94 148/80   Pulse: 90 72 71 67  Temp: 99.1 F (37.3 C) 98.2 F (36.8 C) 98.2 F (36.8 C)   TempSrc: Oral Oral Oral   Resp: 18 16 18    Height:      Weight:      SpO2: 100% 99% 100% 98%    General: She looks systemically well. She is not toxic or septic at the present time. Cardiovascular: Heart sounds present and normal without murmurs. Respiratory: Lung fields are actually clinically clear.  Discharge Instructions  Discharge Orders    Future Orders Please Complete By Expires   Diet - low sodium heart healthy      Increase activity slowly          Medication List     As of 07/02/2012  8:54 AM    STOP taking these medications         ibuprofen 200 MG tablet   Commonly known as: ADVIL,MOTRIN      TAKE these medications         acetaminophen 500 MG tablet   Commonly known as: TYLENOL   Take 500 mg by mouth every 6 (six) hours as needed. For pain      ALPRAZolam 0.5 MG tablet  Commonly known as: XANAX   Take 0.5 mg by mouth 3 (three) times daily as needed. For anxiety      aspirin EC 81 MG tablet   Take 81 mg by mouth every morning.      benztropine 1 MG tablet   Commonly known as: COGENTIN   Take 1 mg by mouth daily. At 1400 (2pm)      bumetanide 0.5 MG tablet   Commonly known as: BUMEX   Take 0.5 mg by mouth every morning.      citalopram 20 MG tablet   Commonly known as: CELEXA   Take 20 mg by mouth every morning.      diclofenac sodium 1 % Gel   Commonly known as: VOLTAREN   Apply topically as needed. For arm pain       haloperidol 2 MG/ML solution   Commonly known as: HALDOL   Take 6 mg by mouth 2 (two) times daily.      levofloxacin 750 MG tablet   Commonly known as: LEVAQUIN   Take 1 tablet (750 mg total) by mouth daily.      metoprolol 50 MG tablet   Commonly known as: LOPRESSOR   Take 25 mg by mouth every morning.      rivastigmine 9.5 mg/24hr   Commonly known as: EXELON   Place 1 patch onto the skin daily. *Remove and discard used patches*      zolpidem 10 MG tablet   Commonly known as: AMBIEN   Take 10 mg by mouth at bedtime.          The results of significant diagnostics from this hospitalization (including imaging, microbiology, ancillary and laboratory) are listed below for reference.    Significant Diagnostic Studies: Dg Chest 1 View  06/30/2012  *RADIOLOGY REPORT*  Clinical Data: Cough.  Infiltrates.  CHF.  Free air.  CHEST - 1 VIEW  Comparison: 02/16/2011.  Findings: Low volume chest.  Patchy density is present at the right lung base which may represent atelectasis or airspace disease.  No free air underneath the hemidiaphragms identified.  Low volumes accentuate the size of the cardiopericardial silhouette.  Tortuous thoracic aorta with aortic atherosclerosis.  Left lung apex excluded from view.  IMPRESSION: Low volume chest with patchy right basilar density which may represent airspace disease or atelectasis.   Original Report Authenticated By: Andreas Newport, M.D.    Ct Head Wo Contrast  06/30/2012  *RADIOLOGY REPORT*  Clinical Data: Dizziness and weakness.  CT HEAD WITHOUT CONTRAST  Technique:  Contiguous axial images were obtained from the base of the skull through the vertex without contrast.  Comparison: 02/16/2011  Findings: Motion artifact limits the study.  No obvious mass effect, midline shift, or acute intracranial hemorrhage.  Mastoid air cells and visualized paranasal sinuses are clear.  Cranium is intact.  Global atrophy and chronic ischemic changes are not  significantly changed.  IMPRESSION: No acute intracranial pathology.   Original Report Authenticated By: Donavan Burnet, M.D.     Microbiology: Recent Results (from the past 240 hour(s))  URINE CULTURE     Status: Normal   Collection Time   06/30/12 11:50 AM      Component Value Range Status Comment   Specimen Description URINE, CATHETERIZED   Final    Special Requests NONE   Final    Culture  Setup Time 06/30/2012 21:37   Final    Colony Count NO GROWTH   Final    Culture NO GROWTH  Final    Report Status 07/02/2012 FINAL   Final      Labs: Basic Metabolic Panel:  Lab 07/02/12 2956 06/30/12 1220  NA 137 137  K 3.5 3.5  CL 104 98  CO2 27 30  GLUCOSE 91 126*  BUN 9 8  CREATININE 0.85 0.69  CALCIUM 9.1 9.7  MG -- --  PHOS -- --   Liver Function Tests:  Lab 07/02/12 0454 06/30/12 1220  AST 23 14  ALT 6 9  ALKPHOS 60 75  BILITOT 0.4 0.5  PROT 6.5 7.6  ALBUMIN 2.5* 3.1*    Lab 06/30/12 1220  LIPASE 17  AMYLASE --    CBC:  Lab 07/02/12 0454 06/30/12 1220  WBC 6.6 8.7  NEUTROABS -- 6.8  HGB 10.4* 12.0  HCT 32.3* 36.9  MCV 71.1* 71.2*  PLT 288 284   Cardiac Enzymes:  Lab 06/30/12 1220  CKTOTAL --  CKMB --  CKMBINDEX --  TROPONINI <0.30     Time coordinating discharge: *Greater than 30 minutes  Signed:  GOSRANI,NIMISH C  [facility] 07/02/2012, 8:54 AM

## 2012-07-02 NOTE — Progress Notes (Signed)
Pt ready for d/c. Went over d/c instructions with pt's daughter. Went over new prescribed antibiotic, continued medications, and instructed not to take ibuprofen for pain per MD instruction to take tylenol for pain instead. Pt d/c'd with daughter to be followed up with [facility].

## 2012-07-10 ENCOUNTER — Emergency Department (HOSPITAL_COMMUNITY)
Admission: EM | Admit: 2012-07-10 | Discharge: 2012-07-10 | Disposition: A | Discharge status: Home / Self Care | Payer: PRIVATE HEALTH INSURANCE | Source: Home / Self Care | Attending: Emergency Medicine | Admitting: Emergency Medicine

## 2012-07-10 ENCOUNTER — Encounter (HOSPITAL_COMMUNITY): Payer: Self-pay | Admitting: Emergency Medicine

## 2012-07-10 ENCOUNTER — Emergency Department (HOSPITAL_COMMUNITY): Payer: PRIVATE HEALTH INSURANCE

## 2012-07-10 DIAGNOSIS — R531 Weakness: Secondary | ICD-10-CM

## 2012-07-10 DIAGNOSIS — I252 Old myocardial infarction: Secondary | ICD-10-CM | POA: Insufficient documentation

## 2012-07-10 DIAGNOSIS — I10 Essential (primary) hypertension: Secondary | ICD-10-CM | POA: Insufficient documentation

## 2012-07-10 DIAGNOSIS — Z859 Personal history of malignant neoplasm, unspecified: Secondary | ICD-10-CM | POA: Insufficient documentation

## 2012-07-10 DIAGNOSIS — G20 Parkinson's disease: Secondary | ICD-10-CM | POA: Insufficient documentation

## 2012-07-10 DIAGNOSIS — F0280 Dementia in other diseases classified elsewhere without behavioral disturbance: Secondary | ICD-10-CM | POA: Insufficient documentation

## 2012-07-10 DIAGNOSIS — G309 Alzheimer's disease, unspecified: Secondary | ICD-10-CM | POA: Insufficient documentation

## 2012-07-10 DIAGNOSIS — Z79899 Other long term (current) drug therapy: Secondary | ICD-10-CM | POA: Insufficient documentation

## 2012-07-10 DIAGNOSIS — R5381 Other malaise: Secondary | ICD-10-CM | POA: Insufficient documentation

## 2012-07-10 DIAGNOSIS — Z9010 Acquired absence of unspecified breast and nipple: Secondary | ICD-10-CM | POA: Insufficient documentation

## 2012-07-10 DIAGNOSIS — Z9049 Acquired absence of other specified parts of digestive tract: Secondary | ICD-10-CM | POA: Insufficient documentation

## 2012-07-10 DIAGNOSIS — Z7982 Long term (current) use of aspirin: Secondary | ICD-10-CM | POA: Insufficient documentation

## 2012-07-10 DIAGNOSIS — G20A1 Parkinson's disease without dyskinesia, without mention of fluctuations: Secondary | ICD-10-CM | POA: Insufficient documentation

## 2012-07-10 DIAGNOSIS — Z9089 Acquired absence of other organs: Secondary | ICD-10-CM | POA: Insufficient documentation

## 2012-07-10 LAB — CBC WITH DIFFERENTIAL/PLATELET
Basophils Absolute: 0 10*3/uL (ref 0.0–0.1)
Basophils Relative: 0 % (ref 0–1)
Eosinophils Absolute: 0 10*3/uL (ref 0.0–0.7)
Eosinophils Relative: 1 % (ref 0–5)
HCT: 35.4 % — ABNORMAL LOW (ref 36.0–46.0)
Hemoglobin: 11.5 g/dL — ABNORMAL LOW (ref 12.0–15.0)
Lymphocytes Relative: 22 % (ref 12–46)
Lymphs Abs: 1.9 10*3/uL (ref 0.7–4.0)
MCH: 22.9 pg — ABNORMAL LOW (ref 26.0–34.0)
MCHC: 32.5 g/dL (ref 30.0–36.0)
MCV: 70.4 fL — ABNORMAL LOW (ref 78.0–100.0)
Monocytes Absolute: 1.1 10*3/uL — ABNORMAL HIGH (ref 0.1–1.0)
Monocytes Relative: 13 % — ABNORMAL HIGH (ref 3–12)
Neutro Abs: 5.5 10*3/uL (ref 1.7–7.7)
Neutrophils Relative %: 65 % (ref 43–77)
Platelets: 347 10*3/uL (ref 150–400)
RBC: 5.03 MIL/uL (ref 3.87–5.11)
RDW: 16.5 % — ABNORMAL HIGH (ref 11.5–15.5)
WBC: 8.5 10*3/uL (ref 4.0–10.5)

## 2012-07-10 LAB — URINALYSIS, ROUTINE W REFLEX MICROSCOPIC
Bilirubin Urine: NEGATIVE
Glucose, UA: NEGATIVE mg/dL
Ketones, ur: NEGATIVE mg/dL
Leukocytes, UA: NEGATIVE
Nitrite: NEGATIVE
Protein, ur: NEGATIVE mg/dL
Specific Gravity, Urine: 1.025 (ref 1.005–1.030)
Urobilinogen, UA: 0.2 mg/dL (ref 0.0–1.0)
pH: 6 (ref 5.0–8.0)

## 2012-07-10 LAB — BASIC METABOLIC PANEL
BUN: 11 mg/dL (ref 6–23)
CO2: 29 mEq/L (ref 19–32)
Calcium: 9.6 mg/dL (ref 8.4–10.5)
Chloride: 102 mEq/L (ref 96–112)
Creatinine, Ser: 0.95 mg/dL (ref 0.50–1.10)
GFR calc Af Amer: 59 mL/min — ABNORMAL LOW (ref >90)
GFR calc non Af Amer: 51 mL/min — ABNORMAL LOW (ref >90)
Glucose, Bld: 110 mg/dL — ABNORMAL HIGH (ref 70–99)
Potassium: 3.8 mEq/L (ref 3.5–5.1)
Sodium: 140 mEq/L (ref 135–145)

## 2012-07-10 LAB — URINE MICROSCOPIC-ADD ON

## 2012-07-10 LAB — TROPONIN I: Troponin I: <0.3 ng/mL (ref <0.30)

## 2012-07-10 MED ORDER — SODIUM CHLORIDE 0.9 % IV SOLN: 1000.0000 mL | INTRAVENOUS | Status: DC

## 2012-07-10 MED ORDER — MOXIFLOXACIN HCL 400 MG PO TABS: 400.0000 mg | ORAL_TABLET | Freq: Every day | ORAL | Status: DC

## 2012-07-10 MED ORDER — SODIUM CHLORIDE 0.9 % IV SOLN
1000.0000 mL | Freq: Once | INTRAVENOUS | Status: AC
  Administered 2012-07-10: 1000 mL via INTRAVENOUS

## 2012-07-10 MED ORDER — CEFTRIAXONE SODIUM 1 G IJ SOLR: 1.0000 g | Freq: Once | INTRAMUSCULAR | Status: DC

## 2012-07-10 MED ORDER — DEXTROSE 5 % IV SOLN: 500.0000 mg | INTRAVENOUS | Status: DC

## 2012-07-10 MED ORDER — MOXIFLOXACIN HCL 400 MG PO TABS
400.0000 mg | ORAL_TABLET | Freq: Every day | ORAL | Status: DC
  Administered 2012-07-10: 400 mg via ORAL
  Filled 2012-07-10: qty 1

## 2012-07-10 MED ORDER — DEXTROSE 5 % IV SOLN
1.0000 g | Freq: Once | INTRAVENOUS | Status: DC
  Filled 2012-07-10: qty 10

## 2012-07-10 NOTE — ED Provider Notes (Signed)
History     CSN: [id number]  Arrival date & time 07/10/12  1753   First MD Initiated Contact with Patient 07/10/12 1809      Chief Complaint  Patient presents with  . Fatigue     The history is provided by the patient.   the patient reports generalized weakness for approximately 48 hours.  She's had no cough or congestion.  She denies shortness of breath.  No fevers or chills.  No dysuria or urinary frequency.  Her appetite has been slightly decreased today.  She had normal breakfast.  Reports that she is just not as active today she normally is.  No nausea or vomiting.  No diarrhea.  No melena or hematochezia.  No chest pain.  Her symptoms are mild in severity  Past Medical History  Diagnosis Date  . Hypertension   . Alzheimer's dementia   . Heart attack   . Parkinson disease   . Cancer     Past Surgical History  Procedure Date  . Breast surgery   . Abdominal hysterectomy   . Mastectomy   . Small intestine removed   . Tonsillectomy   . Cholecystectomy   . Colon surgery     History reviewed. No pertinent family history.  History  Substance Use Topics  . Smoking status: Never Smoker   . Smokeless tobacco: Not on file  . Alcohol Use: No    OB History    Grav Para Term Preterm Abortions TAB SAB Ect Mult Living                  Review of Systems  All other systems reviewed and are negative.    Allergies  Penicillins  Home Medications   Current Outpatient Rx  Name Route Sig Dispense Refill  . ACETAMINOPHEN 500 MG PO TABS Oral Take 500 mg by mouth at bedtime. For pain    . ALPRAZOLAM 0.5 MG PO TABS Oral Take 0.5 mg by mouth 3 (three) times daily as needed. For anxiety    . ASPIRIN EC 81 MG PO TBEC Oral Take 81 mg by mouth every morning.     Marland Kitchen BENZTROPINE MESYLATE 1 MG PO TABS Oral Take 1 mg by mouth daily. At 1400 (2pm)    . BUMETANIDE 0.5 MG PO TABS Oral Take 0.5 mg by mouth every morning.     Marland Kitchen CITALOPRAM HYDROBROMIDE 20 MG PO TABS Oral Take 20 mg by  mouth every morning.     Marland Kitchen DICLOFENAC SODIUM 1 % TD GEL Topical Apply topically as needed. For arm pain    . HALOPERIDOL LACTATE 2 MG/ML PO CONC Oral Take 6 mg by mouth 2 (two) times daily.    Marland Kitchen METOPROLOL TARTRATE 50 MG PO TABS Oral Take 25 mg by mouth every morning.     Marland Kitchen RIVASTIGMINE 9.5 MG/24HR TD PT24 Transdermal Place 1 patch onto the skin daily. *Remove and discard used patches*    . ZOLPIDEM TARTRATE 10 MG PO TABS Oral Take 10 mg by mouth at bedtime.    Marland Kitchen LEVOFLOXACIN 750 MG PO TABS Oral Take 1 tablet (750 mg total) by mouth daily. 7 tablet 0  . MOXIFLOXACIN HCL 400 MG PO TABS Oral Take 1 tablet (400 mg total) by mouth daily. 7 tablet 0    BP 135/87  Pulse 69  Temp 98.9 F (37.2 C) (Rectal)  Resp 18  SpO2 100%  Physical Exam  Nursing note and vitals reviewed. Constitutional: She is oriented to person,  place, and time. She appears well-developed and well-nourished. No distress.  HENT:  Head: Normocephalic and atraumatic.  Eyes: EOM are normal.  Neck: Normal range of motion.  Cardiovascular: Normal rate, regular rhythm and normal heart sounds.   Pulmonary/Chest: Effort normal and breath sounds normal.  Abdominal: Soft. She exhibits no distension. There is no tenderness.  Musculoskeletal: Normal range of motion.  Neurological: She is alert and oriented to person, place, and time.  Skin: Skin is warm and dry.  Psychiatric: She has a normal mood and affect. Judgment normal.    ED Course  Procedures (including critical care time)  Labs Reviewed  CBC WITH DIFFERENTIAL - Abnormal; Notable for the following:    Hemoglobin 11.5 (*)     HCT 35.4 (*)     MCV 70.4 (*)     MCH 22.9 (*)     RDW 16.5 (*)     Monocytes Relative 13 (*)     Monocytes Absolute 1.1 (*)     All other components within normal limits  BASIC METABOLIC PANEL - Abnormal; Notable for the following:    Glucose, Bld 110 (*)     GFR calc non Af Amer 51 (*)     GFR calc Af Amer 59 (*)     All other  components within normal limits  URINALYSIS, ROUTINE W REFLEX MICROSCOPIC - Abnormal; Notable for the following:    Hgb urine dipstick SMALL (*)     All other components within normal limits  TROPONIN I  URINE MICROSCOPIC-ADD ON   Dg Chest 1 View  07/10/2012  *RADIOLOGY REPORT*  Clinical Data: Fatigue.  CHEST - 1 VIEW  Comparison: Chest x-ray 06/30/2012.  Findings: Lung volumes are low. Ill-defined medial right basilar opacity.  No pleural effusions.  Pulmonary vasculature is normal. Mild cardiomegaly is unchanged. The patient is rotated to the right on today's exam, resulting in distortion of the mediastinal contours and reduced diagnostic sensitivity and specificity for mediastinal pathology.  Atherosclerosis in the thoracic aorta. Unusual opacity projecting over the right apex is favored to represent overlying soft tissues.  IMPRESSION: 1.  Low lung volumes with ill-defined medial right basilar opacity which may represent atelectasis and/or consolidation. 2.  Mild cardiomegaly is unchanged. 3.  Atherosclerosis.   Original Report Authenticated By: Florencia Reasons, M.D.    Ct Head Wo Contrast  07/10/2012  *RADIOLOGY REPORT*  Clinical Data: Dementia.  Hypertensive.  Breast cancer.  Fatigue.  CT HEAD WITHOUT CONTRAST  Technique:  Contiguous axial images were obtained from the base of the skull through the vertex without contrast.  Comparison: 06/30/2012.  Findings: No intracranial hemorrhage.  Global atrophy without hydrocephalus.  Small vessel disease type changes without CT evidence of large acute infarct.  No intracranial mass lesion detected on this unenhanced exam.  No skull base abnormality or skull fracture.  Visualized sinuses and mastoid air cells are clear.  IMPRESSION: No acute intracranial abnormality noted.   Original Report Authenticated By: Fuller Canada, M.D.     I personally reviewed the imaging tests through PACS system  I reviewed available ER/hospitalization records thought  the EMR   1. Weakness       MDM  My suspicion that this is pneumonia is rather low.  She will be given a dose of Avelox in the emergency department if she is 78 years old.  She feels much better after IV fluids.  Have asked the patient followup with her primary care physician for recheck tomorrow next  is unable to followup with her physician she is to return the emergency department tomorrow to see me.  She's had no cough and she denies shortness of breath.  IV fluids alone the patient is feeling much better.  She may have been slightly dehydrated.  This will be less than 24-hour followup.  Instructed the patient and the patient's family to return the patient emergency apartment sooner for new or worsening symptoms.  I suspect the risk of admission will be greater than the benefit for this 78 year old female        Lyanne Co, MD 07/10/12 2159

## 2012-07-10 NOTE — ED Notes (Signed)
Pt c/o weakness for a couple of days. Per EMS pt has missed her last physical therapy appointments.

## 2012-07-10 NOTE — ED Notes (Signed)
Patient to [facility]

## 2012-07-11 ENCOUNTER — Emergency Department (HOSPITAL_COMMUNITY): Payer: PRIVATE HEALTH INSURANCE

## 2012-07-11 ENCOUNTER — Inpatient Hospital Stay (HOSPITAL_COMMUNITY)
Admission: EM | Admit: 2012-07-11 | Discharge: 2012-07-14 | DRG: 205 | Disposition: A | Discharge status: Home Health | Payer: PRIVATE HEALTH INSURANCE | Attending: Internal Medicine | Admitting: Internal Medicine

## 2012-07-11 ENCOUNTER — Encounter (HOSPITAL_COMMUNITY): Payer: Self-pay | Admitting: *Deleted

## 2012-07-11 DIAGNOSIS — I252 Old myocardial infarction: Secondary | ICD-10-CM

## 2012-07-11 DIAGNOSIS — D638 Anemia in other chronic diseases classified elsewhere: Secondary | ICD-10-CM | POA: Diagnosis present

## 2012-07-11 DIAGNOSIS — J9819 Other pulmonary collapse: Principal | ICD-10-CM | POA: Diagnosis present

## 2012-07-11 DIAGNOSIS — Z859 Personal history of malignant neoplasm, unspecified: Secondary | ICD-10-CM

## 2012-07-11 DIAGNOSIS — F411 Generalized anxiety disorder: Secondary | ICD-10-CM | POA: Diagnosis present

## 2012-07-11 DIAGNOSIS — Z90710 Acquired absence of both cervix and uterus: Secondary | ICD-10-CM

## 2012-07-11 DIAGNOSIS — Z9049 Acquired absence of other specified parts of digestive tract: Secondary | ICD-10-CM

## 2012-07-11 DIAGNOSIS — G309 Alzheimer's disease, unspecified: Secondary | ICD-10-CM | POA: Diagnosis present

## 2012-07-11 DIAGNOSIS — G20 Parkinson's disease: Secondary | ICD-10-CM | POA: Diagnosis present

## 2012-07-11 DIAGNOSIS — G20A1 Parkinson's disease without dyskinesia, without mention of fluctuations: Secondary | ICD-10-CM | POA: Diagnosis present

## 2012-07-11 DIAGNOSIS — F0390 Unspecified dementia without behavioral disturbance: Secondary | ICD-10-CM

## 2012-07-11 DIAGNOSIS — Z880 Allergy status to penicillin: Secondary | ICD-10-CM

## 2012-07-11 DIAGNOSIS — F0280 Dementia in other diseases classified elsewhere without behavioral disturbance: Secondary | ICD-10-CM | POA: Diagnosis present

## 2012-07-11 DIAGNOSIS — S62619A Displaced fracture of proximal phalanx of unspecified finger, initial encounter for closed fracture: Secondary | ICD-10-CM

## 2012-07-11 DIAGNOSIS — R5381 Other malaise: Secondary | ICD-10-CM

## 2012-07-11 DIAGNOSIS — R531 Weakness: Secondary | ICD-10-CM

## 2012-07-11 DIAGNOSIS — Z79899 Other long term (current) drug therapy: Secondary | ICD-10-CM

## 2012-07-11 DIAGNOSIS — Z66 Do not resuscitate: Secondary | ICD-10-CM | POA: Diagnosis present

## 2012-07-11 DIAGNOSIS — Z9010 Acquired absence of unspecified breast and nipple: Secondary | ICD-10-CM

## 2012-07-11 DIAGNOSIS — Z7982 Long term (current) use of aspirin: Secondary | ICD-10-CM

## 2012-07-11 DIAGNOSIS — J189 Pneumonia, unspecified organism: Secondary | ICD-10-CM | POA: Diagnosis present

## 2012-07-11 DIAGNOSIS — I10 Essential (primary) hypertension: Secondary | ICD-10-CM | POA: Diagnosis present

## 2012-07-11 DIAGNOSIS — R5383 Other fatigue: Secondary | ICD-10-CM

## 2012-07-11 LAB — BASIC METABOLIC PANEL
BUN: 9 mg/dL (ref 6–23)
CO2: 28 mEq/L (ref 19–32)
Calcium: 8.5 mg/dL (ref 8.4–10.5)
Chloride: 101 mEq/L (ref 96–112)
Creatinine, Ser: 0.88 mg/dL (ref 0.50–1.10)
GFR calc Af Amer: 65 mL/min — ABNORMAL LOW (ref >90)
GFR calc non Af Amer: 56 mL/min — ABNORMAL LOW (ref >90)
Glucose, Bld: 93 mg/dL (ref 70–99)
Potassium: 3.6 mEq/L (ref 3.5–5.1)
Sodium: 135 mEq/L (ref 135–145)

## 2012-07-11 LAB — CBC
HCT: 31.2 % — ABNORMAL LOW (ref 36.0–46.0)
Hemoglobin: 10.1 g/dL — ABNORMAL LOW (ref 12.0–15.0)
MCH: 22.9 pg — ABNORMAL LOW (ref 26.0–34.0)
MCHC: 32.4 g/dL (ref 30.0–36.0)
MCV: 70.7 fL — ABNORMAL LOW (ref 78.0–100.0)
Platelets: 338 10*3/uL (ref 150–400)
RBC: 4.41 MIL/uL (ref 3.87–5.11)
RDW: 16.8 % — ABNORMAL HIGH (ref 11.5–15.5)
WBC: 8.1 10*3/uL (ref 4.0–10.5)

## 2012-07-11 LAB — TROPONIN I: Troponin I: <0.3 ng/mL (ref <0.30)

## 2012-07-11 MED ORDER — LEVOFLOXACIN IN D5W 750 MG/150ML IV SOLN
INTRAVENOUS | Status: AC
  Filled 2012-07-11: qty 150

## 2012-07-11 MED ORDER — CITALOPRAM HYDROBROMIDE 20 MG PO TABS
20.0000 mg | ORAL_TABLET | Freq: Every morning | ORAL | Status: DC
  Administered 2012-07-12 – 2012-07-13 (×2): 20 mg via ORAL
  Filled 2012-07-11 (×4): qty 1

## 2012-07-11 MED ORDER — HEPARIN SODIUM (PORCINE) 5000 UNIT/ML IJ SOLN
5000.0000 [IU] | Freq: Three times a day (TID) | INTRAMUSCULAR | Status: DC
  Administered 2012-07-11 – 2012-07-14 (×9): 5000 [IU] via SUBCUTANEOUS
  Filled 2012-07-11 (×9): qty 1

## 2012-07-11 MED ORDER — ZOLPIDEM TARTRATE 5 MG PO TABS
5.0000 mg | ORAL_TABLET | Freq: Every day | ORAL | Status: DC
  Administered 2012-07-11 – 2012-07-13 (×3): 5 mg via ORAL
  Filled 2012-07-11 (×3): qty 1

## 2012-07-11 MED ORDER — ACETAMINOPHEN 500 MG PO TABS
500.0000 mg | ORAL_TABLET | Freq: Every day | ORAL | Status: DC
  Administered 2012-07-11 – 2012-07-13 (×3): 500 mg via ORAL
  Filled 2012-07-11 (×3): qty 1

## 2012-07-11 MED ORDER — LEVOFLOXACIN IN D5W 500 MG/100ML IV SOLN: 500.0000 mg | INTRAVENOUS | Status: DC

## 2012-07-11 MED ORDER — DEXTROSE 5 % IV SOLN
500.0000 mg | INTRAVENOUS | Status: DC
  Administered 2012-07-11: 500 mg via INTRAVENOUS
  Filled 2012-07-11: qty 500

## 2012-07-11 MED ORDER — HALOPERIDOL 2 MG PO TABS
ORAL_TABLET | ORAL | Status: AC
  Filled 2012-07-11: qty 3

## 2012-07-11 MED ORDER — DICLOFENAC SODIUM 1 % TD GEL
2.0000 g | TRANSDERMAL | Status: DC | PRN
  Filled 2012-07-11: qty 100

## 2012-07-11 MED ORDER — ASPIRIN EC 81 MG PO TBEC
81.0000 mg | DELAYED_RELEASE_TABLET | Freq: Every morning | ORAL | Status: DC
  Administered 2012-07-12 – 2012-07-13 (×2): 81 mg via ORAL
  Filled 2012-07-11 (×4): qty 1

## 2012-07-11 MED ORDER — DEXTROSE 5 % IV SOLN
INTRAVENOUS | Status: AC
  Administered 2012-07-11: 18:00:00
  Filled 2012-07-11: qty 10

## 2012-07-11 MED ORDER — ALPRAZOLAM 0.5 MG PO TABS
0.5000 mg | ORAL_TABLET | Freq: Three times a day (TID) | ORAL | Status: DC | PRN
  Administered 2012-07-12: 0.5 mg via ORAL
  Filled 2012-07-11: qty 1

## 2012-07-11 MED ORDER — HALOPERIDOL LACTATE 2 MG/ML PO CONC: 6.0000 mg | Freq: Two times a day (BID) | ORAL | Status: DC

## 2012-07-11 MED ORDER — RIVASTIGMINE 9.5 MG/24HR TD PT24
9.5000 mg | MEDICATED_PATCH | Freq: Every day | TRANSDERMAL | Status: DC
  Administered 2012-07-12: 9.5 mg via TRANSDERMAL
  Filled 2012-07-11 (×5): qty 1

## 2012-07-11 MED ORDER — CEFTRIAXONE SODIUM 1 G IJ SOLR: 1.0000 g | Freq: Once | INTRAMUSCULAR | Status: DC

## 2012-07-11 MED ORDER — ZOLPIDEM TARTRATE 5 MG PO TABS: 10.0000 mg | ORAL_TABLET | Freq: Every day | ORAL | Status: DC

## 2012-07-11 MED ORDER — LEVOFLOXACIN IN D5W 750 MG/150ML IV SOLN
750.0000 mg | INTRAVENOUS | Status: DC
  Administered 2012-07-11: 750 mg via INTRAVENOUS
  Filled 2012-07-11 (×2): qty 150

## 2012-07-11 MED ORDER — HALOPERIDOL 2 MG PO TABS
6.0000 mg | ORAL_TABLET | Freq: Two times a day (BID) | ORAL | Status: DC
  Administered 2012-07-11 – 2012-07-13 (×4): 6 mg via ORAL
  Filled 2012-07-11 (×7): qty 3

## 2012-07-11 MED ORDER — SODIUM CHLORIDE 0.9 % IV SOLN
1000.0000 mL | INTRAVENOUS | Status: DC
  Administered 2012-07-11: 1000 mL via INTRAVENOUS

## 2012-07-11 MED ORDER — BENZTROPINE MESYLATE 1 MG PO TABS
1.0000 mg | ORAL_TABLET | Freq: Every day | ORAL | Status: DC
  Administered 2012-07-11 – 2012-07-13 (×3): 1 mg via ORAL
  Filled 2012-07-11 (×4): qty 1

## 2012-07-11 MED ORDER — SODIUM CHLORIDE 0.9 % IV SOLN
1000.0000 mL | Freq: Once | INTRAVENOUS | Status: AC
  Administered 2012-07-11: 1000 mL via INTRAVENOUS

## 2012-07-11 MED ORDER — METOPROLOL TARTRATE 25 MG PO TABS
25.0000 mg | ORAL_TABLET | Freq: Every morning | ORAL | Status: DC
  Administered 2012-07-12 – 2012-07-13 (×2): 25 mg via ORAL
  Filled 2012-07-11 (×3): qty 1

## 2012-07-11 NOTE — ED Provider Notes (Signed)
History   This chart was scribed for Lyanne Co, MD by Sofie Rower. The patient was seen in room APA11/AP[facility]11 and the patient's care was started at 1:12PM.     CSN: [id number]  Arrival date & time 07/11/12  1258   First MD Initiated Contact with Patient 07/11/12 1312      Chief Complaint  Patient presents with  . Follow-up     The history is provided by the patient, a relative and medical records.    [name] is a 78 y.o. female , with a hx of weakness and sore throat (Pt was seen at [facility], yesterday, 07/10/12 by Dr. Patria Mane) who presents to the Emergency Department complaining of sudden, progressively worsening, weakness, onset yesterday (07/10/12).  Associated symptoms include sore throat and neck pain. The pt's relative reports the pt appears to be looking and feeling better than she did yesterday, however, she has since regressed from the state of health displayed at the time of yesterday's discharge. The pt has been eating well today, however, she presents asleep, which is abnormal from her baseline.  She continues to deny cough and shortness of breath.  She has no abdominal pain.  She denies nausea vomiting.  Family reports decreased oral intake today.  She took her Avelox yesterday in the emergency department.  The pt does not smoke or drink alcohol.   PCP is Dr. Loleta Chance.    Past Medical History  Diagnosis Date  . Hypertension   . Alzheimer's dementia   . Heart attack   . Parkinson disease   . Cancer     Past Surgical History  Procedure Date  . Breast surgery   . Abdominal hysterectomy   . Mastectomy   . Small intestine removed   . Tonsillectomy   . Cholecystectomy   . Colon surgery     No family history on file.  History  Substance Use Topics  . Smoking status: Never Smoker   . Smokeless tobacco: Not on file  . Alcohol Use: No    OB History    Grav Para Term Preterm Abortions TAB SAB Ect Mult Living                  Review of Systems  Respiratory:  Negative for shortness of breath.   Cardiovascular: Negative for chest pain.  Gastrointestinal: Negative for abdominal pain.  Neurological: Positive for weakness. Negative for headaches.  All other systems reviewed and are negative.    Allergies  Penicillins  Home Medications   Current Outpatient Rx  Name Route Sig Dispense Refill  . ACETAMINOPHEN 500 MG PO TABS Oral Take 500 mg by mouth at bedtime. For pain    . ALPRAZOLAM 0.5 MG PO TABS Oral Take 0.5 mg by mouth 3 (three) times daily as needed. For anxiety    . ASPIRIN EC 81 MG PO TBEC Oral Take 81 mg by mouth every morning.     Marland Kitchen BENZTROPINE MESYLATE 1 MG PO TABS Oral Take 1 mg by mouth daily. At 1400 (2pm)    . BUMETANIDE 0.5 MG PO TABS Oral Take 0.5 mg by mouth every morning.     Marland Kitchen CITALOPRAM HYDROBROMIDE 20 MG PO TABS Oral Take 20 mg by mouth every morning.     Marland Kitchen DICLOFENAC SODIUM 1 % TD GEL Topical Apply topically as needed. For arm pain    . HALOPERIDOL LACTATE 2 MG/ML PO CONC Oral Take 6 mg by mouth 2 (two) times daily.    Marland Kitchen  LEVOFLOXACIN 750 MG PO TABS Oral Take 1 tablet (750 mg total) by mouth daily. 7 tablet 0  . METOPROLOL TARTRATE 50 MG PO TABS Oral Take 25 mg by mouth every morning.     Marland Kitchen MOXIFLOXACIN HCL 400 MG PO TABS Oral Take 1 tablet (400 mg total) by mouth daily. 7 tablet 0  . RIVASTIGMINE 9.5 MG/24HR TD PT24 Transdermal Place 1 patch onto the skin daily. *Remove and discard used patches*    . ZOLPIDEM TARTRATE 10 MG PO TABS Oral Take 10 mg by mouth at bedtime.      BP 120/67  Pulse 62  Temp 97.2 F (36.2 C) (Oral)  Resp 20  SpO2 100%  Physical Exam  Nursing note and vitals reviewed. Constitutional: She is oriented to person, place, and time. She appears well-developed and well-nourished. No distress.  HENT:  Head: Normocephalic and atraumatic.  Right Ear: Tympanic membrane normal.  Left Ear: Tympanic membrane normal.  Mouth/Throat: Uvula is midline and oropharynx is clear and moist.       Uvula  midline, pt is tolerating secretions.   Eyes: EOM are normal.  Neck: Normal range of motion.  Cardiovascular: Normal rate, regular rhythm and normal heart sounds.   Pulmonary/Chest: Effort normal and breath sounds normal.  Abdominal: Soft. She exhibits no distension. There is no tenderness.  Musculoskeletal: Normal range of motion.  Lymphadenopathy:    She has no cervical adenopathy.  Neurological: She is alert and oriented to person, place, and time.  Skin: Skin is warm and dry.  Psychiatric: She has a normal mood and affect. Judgment normal.    ED Course  Procedures (including critical care time)  DIAGNOSTIC STUDIES: Oxygen Saturation is 100% on room air, normal by my interpretation.    COORDINATION OF CARE:   1:47 PM- Treatment plan concerning yesterdays treatment of IV fluids and application of antibiotics discussed with patient. Pt agrees with treatment.    1:51 PM- Treatment plan concerning blood work, IV fluids, and x-ray discussed with patient. Pt agrees with treatment.    Date: 07/11/2012  Rate: 50  Rhythm: normal sinus rhythm  QRS Axis: normal  Intervals: normal  ST/T Wave abnormalities: normal  Conduction Disutrbances: none  Narrative Interpretation:   Old EKG Reviewed: No significant changes noted     Results for orders placed during the hospital encounter of 07/11/12  CBC      Component Value Range   WBC 8.1  4.0 - 10.5 K/uL   RBC 4.41  3.87 - 5.11 MIL/uL   Hemoglobin 10.1 (*) 12.0 - 15.0 g/dL   HCT 16.1 (*) 09.6 - 04.5 %   MCV 70.7 (*) 78.0 - 100.0 fL   MCH 22.9 (*) 26.0 - 34.0 pg   MCHC 32.4  30.0 - 36.0 g/dL   RDW 40.9 (*) 81.1 - 91.4 %   Platelets 338  150 - 400 K/uL  BASIC METABOLIC PANEL      Component Value Range   Sodium 135  135 - 145 mEq/L   Potassium 3.6  3.5 - 5.1 mEq/L   Chloride 101  96 - 112 mEq/L   CO2 28  19 - 32 mEq/L   Glucose, Bld 93  70 - 99 mg/dL   BUN 9  6 - 23 mg/dL   Creatinine, Ser 7.82  0.50 - 1.10 mg/dL   Calcium  8.5  8.4 - 95.6 mg/dL   GFR calc non Af Amer 56 (*) >90 mL/min   GFR calc Af Denyse Dago  65 (*) >90 mL/min  TROPONIN I      Component Value Range   Troponin I <0.30  <0.30 ng/mL   Dg Chest 1 View  07/10/2012  *RADIOLOGY REPORT*  Clinical Data: Fatigue.  CHEST - 1 VIEW  Comparison: Chest x-ray 06/30/2012.  Findings: Lung volumes are low. Ill-defined medial right basilar opacity.  No pleural effusions.  Pulmonary vasculature is normal. Mild cardiomegaly is unchanged. The patient is rotated to the right on today's exam, resulting in distortion of the mediastinal contours and reduced diagnostic sensitivity and specificity for mediastinal pathology.  Atherosclerosis in the thoracic aorta. Unusual opacity projecting over the right apex is favored to represent overlying soft tissues.  IMPRESSION: 1.  Low lung volumes with ill-defined medial right basilar opacity which may represent atelectasis and/or consolidation. 2.  Mild cardiomegaly is unchanged. 3.  Atherosclerosis.   Original Report Authenticated By: Florencia Reasons, M.D.      Ct Head Wo Contrast  07/10/2012  *RADIOLOGY REPORT*  Clinical Data: Dementia.  Hypertensive.  Breast cancer.  Fatigue.  CT HEAD WITHOUT CONTRAST  Technique:  Contiguous axial images were obtained from the base of the skull through the vertex without contrast.  Comparison: 06/30/2012.  Findings: No intracranial hemorrhage.  Global atrophy without hydrocephalus.  Small vessel disease type changes without CT evidence of large acute infarct.  No intracranial mass lesion detected on this unenhanced exam.  No skull base abnormality or skull fracture.  Visualized sinuses and mastoid air cells are clear.  IMPRESSION: No acute intracranial abnormality noted.   Original Report Authenticated By: Fuller Canada, M.D.      Dg Chest Port 1 View  07/11/2012  *RADIOLOGY REPORT*  Clinical Data: Weakness.  Recent pneumonia .  PORTABLE CHEST - 1 VIEW  Comparison: 1 day prior  Findings: Midline  trachea.  Mild cardiomegaly with tortuous thoracic aorta. No pleural effusion or pneumothorax.  Mildly low lung volumes.  Slight increase in right base air space disease. Suspect developing mild left base air space disease. No congestive failure.  IMPRESSION: Increase in right base atelectasis versus early infection.  Suspect developing left base atelectasis.  Cardiomegaly without congestive failure.   Original Report Authenticated By: Consuello Bossier, M.D.     I personally reviewed the imaging tests through PACS system  I reviewed available ER/hospitalization records thought the EMR    1. CAP (community acquired pneumonia)       MDM  Appears to be worsening pneumonia on chest x-ray.  Will that the patient for IV antibiotics.  IV antibiotics initiated in the emergency department.      I personally performed the services described in this documentation, which was scribed in my presence. The recorded information has been reviewed and considered.      Lyanne Co, MD 07/11/12 [phone]

## 2012-07-11 NOTE — ED Notes (Signed)
Pt called desk, needed to use bathroom. Pt placed on bedpan.

## 2012-07-11 NOTE — Progress Notes (Signed)
ANTIBIOTIC CONSULT NOTE - INITIAL  Pharmacy Consult for Antibiotic Adjustment for Renal Function Indication: rule out pneumonia  Allergies  Allergen Reactions  . Penicillins Swelling and Rash    Patient Measurements:   Vital Signs: Temp: 98.1 F (36.7 C) (10/24 1428) Temp src: Oral (10/24 1428) BP: 120/67 mmHg (10/24 1338) Pulse Rate: 62  (10/24 1428) Intake/Output from previous day:   Intake/Output from this shift:    Labs:  [facility] 07/11/12 1404 07/10/12 1859  WBC 8.1 8.5  HGB 10.1* 11.5*  PLT 338 347  LABCREA -- --  CREATININE 0.88 0.95   The CrCl is unknown because both a height and weight (above a minimum accepted value) are required for this calculation. No results found for this basename: VANCOTROUGH:2,VANCOPEAK:2,VANCORANDOM:2,GENTTROUGH:2,GENTPEAK:2,GENTRANDOM:2,TOBRATROUGH:2,TOBRAPEAK:2,TOBRARND:2,AMIKACINPEAK:2,AMIKACINTROU:2,AMIKACIN:2, in the last 72 hours   Microbiology: Recent Results (from the past 720 hour(s))  URINE CULTURE     Status: Normal   Collection Time   06/30/12 11:50 AM      Component Value Range Status Comment   Specimen Description URINE, CATHETERIZED   Final    Special Requests NONE   Final    Culture  Setup Time 06/30/2012 21:37   Final    Colony Count NO GROWTH   Final    Culture NO GROWTH   Final    Report Status 07/02/2012 FINAL   Final     Medical History: Past Medical History  Diagnosis Date  . Hypertension   . Alzheimer's dementia   . Heart attack   . Parkinson disease   . Cancer     Medications:  Scheduled:    . acetaminophen  500 mg Oral QHS  . aspirin EC  81 mg Oral q morning - 10a  . benztropine  1 mg Oral Daily  . cefTRIAXone  1 g Intramuscular Once  . citalopram  20 mg Oral q morning - 10a  . haloperidol  6 mg Oral BID  . heparin  5,000 Units Subcutaneous Q8H  . metoprolol  25 mg Oral q morning - 10a  . rivastigmine  9.5 mg Transdermal Daily  . zolpidem  10 mg Oral QHS  . DISCONTD: haloperidol  6 mg  Oral BID   Assessment: Patient weight approximately 55 kg Calculated CRCL approximately 34 ml/min  Goal of Therapy:  eradicate infection  Plan:  Change Levaquin dosing to 750 mg IV every 48 hours No other adjustments needed at this time Monitor renal function  Raquel James, Druscilla Petsch Bennett 07/11/2012,5:09 PM

## 2012-07-11 NOTE — ED Notes (Signed)
Pt is active in bed, pulling on lines, and clothing. Family returned to bedside to sit with pt.

## 2012-07-11 NOTE — ED Notes (Signed)
Pt returned to ed per request from yesterdays visit.  Pt was seen here and diagnosed with pneumonia. Family member states pt was better when leaving last night, however pt is "sleepy" today. Pt is lethargic, awakens to painful and verbal stimuli  Pt denies pain at this time. PIV placed on third attempt. BBS clear with slight decrease noted in right lower lobe. Pt has dementia per family.

## 2012-07-11 NOTE — H&P (Signed)
[facility] History and Physical  [name] ZOX:[id number] DOB: May 09, 1920 DOA: 07/11/2012  Referring physician:  Dr. Patria Mane, ER physician. PCP: Evlyn Courier, MD    Chief Complaint: Generalized weakness.  HPI: [name] is a 78 y.o. female presents once again with the above symptoms for the last few days. She was recently hospitalized 11 days ago with eventual diagnosis of probable pneumonia. However, she made a very quick recovery and was able to be discharged home. Once again, she has similar symptoms of generalized weakness without cough, dyspnea or fever. She does have moderate to severe dementia. She is not able to give me any kind of history. Family members, which include a brother and sister-in-law, also are unable to give me any clear history.   Review of Systems:Unable to get a review of systems secondary to dementia..  Past Medical History  Diagnosis Date  . Hypertension   . Alzheimer's dementia   . Heart attack   . Parkinson disease   . Cancer    Past Surgical History  Procedure Date  . Breast surgery   . Abdominal hysterectomy   . Mastectomy   . Small intestine removed   . Tonsillectomy   . Cholecystectomy   . Colon surgery    Social History:  Lives with her daughter. Does not smoke and does not drink alcohol. Does require help with activities of daily living.  Allergies  Allergen Reactions  . Penicillins Swelling and Rash    No family history on file. not relevant.   Prior to Admission medications   Medication Sig Start Date End Date Taking? Authorizing Provider  acetaminophen (TYLENOL) 500 MG tablet Take 500 mg by mouth at bedtime. For pain    Historical Provider, MD  ALPRAZolam Prudy Feeler) 0.5 MG tablet Take 0.5 mg by mouth 3 (three) times daily as needed. For anxiety    Historical Provider, MD  aspirin EC 81 MG tablet Take 81 mg by mouth every morning.     Historical Provider, MD  benztropine (COGENTIN) 1 MG tablet Take 1 mg by mouth daily. At  1400 (2pm)    Historical Provider, MD  bumetanide (BUMEX) 0.5 MG tablet Take 0.5 mg by mouth every morning.     Historical Provider, MD  citalopram (CELEXA) 20 MG tablet Take 20 mg by mouth every morning.     Historical Provider, MD  diclofenac sodium (VOLTAREN) 1 % GEL Apply topically as needed. For arm pain    Historical Provider, MD  haloperidol (HALDOL) 2 MG/ML solution Take 6 mg by mouth 2 (two) times daily.    Historical Provider, MD  levofloxacin (LEVAQUIN) 750 MG tablet Take 1 tablet (750 mg total) by mouth daily. 07/02/12   Kaylan Yates Normajean Glasgow, MD  metoprolol (LOPRESSOR) 50 MG tablet Take 25 mg by mouth every morning.     Historical Provider, MD  moxifloxacin (AVELOX) 400 MG tablet Take 1 tablet (400 mg total) by mouth daily. 07/10/12   Lyanne Co, MD  rivastigmine (EXELON) 9.5 mg/24hr Place 1 patch onto the skin daily. *Remove and discard used patches*    Historical Provider, MD  zolpidem (AMBIEN) 10 MG tablet Take 10 mg by mouth at bedtime.    Historical Provider, MD   Physical Exam: Filed Vitals:   07/11/12 1338 07/11/12 1428  BP: 120/67   Pulse: 62 62  Temp: 97.2 F (36.2 C) 98.1 F (36.7 C)  TempSrc: Oral Oral  Resp: 20 16  SpO2: 100% 100%  General:  She looks slightly dehydrated but is not toxic or septic.  Eyes: No pallor. No jaundice.  ENT: Within normal limits.  Neck:  No lymphadenopathy.  Cardiovascular: Heart sounds are present and appear to be in sinus rhythm. No murmur.  Respiratory: Lung fields anteriorly and posteriorly are really clear. There is no bronchial breathing, crackles or wheezing.  Abdomen: Soft, nontender. No masses.  Skin: No rash.  Musculoskeletal: Age-related osteoarthritis.  Psychiatric: Anxious, possibly delirious.  Neurologic: Alert but confused, possibly her baseline status. No focal neurological signs.  Labs on Admission:  Basic Metabolic Panel:  Lab 07/11/12 1610 07/10/12 1859  NA 135 140  K 3.6 3.8  CL 101 102    CO2 28 29  GLUCOSE 93 110*  BUN 9 11  CREATININE 0.88 0.95  CALCIUM 8.5 9.6  MG -- --  PHOS -- --       CBC:  Lab 07/11/12 1404 07/10/12 1859  WBC 8.1 8.5  NEUTROABS -- 5.5  HGB 10.1* 11.5*  HCT 31.2* 35.4*  MCV 70.7* 70.4*  PLT 338 347   Cardiac Enzymes:  Lab 07/11/12 1404 07/10/12 1859  CKTOTAL -- --  CKMB -- --  CKMBINDEX -- --  TROPONINI <0.30 <0.30      Radiological Exams on Admission: Dg Chest 1 View  07/10/2012  *RADIOLOGY REPORT*  Clinical Data: Fatigue.  CHEST - 1 VIEW  Comparison: Chest x-ray 06/30/2012.  Findings: Lung volumes are low. Ill-defined medial right basilar opacity.  No pleural effusions.  Pulmonary vasculature is normal. Mild cardiomegaly is unchanged. The patient is rotated to the right on today's exam, resulting in distortion of the mediastinal contours and reduced diagnostic sensitivity and specificity for mediastinal pathology.  Atherosclerosis in the thoracic aorta. Unusual opacity projecting over the right apex is favored to represent overlying soft tissues.  IMPRESSION: 1.  Low lung volumes with ill-defined medial right basilar opacity which may represent atelectasis and/or consolidation. 2.  Mild cardiomegaly is unchanged. 3.  Atherosclerosis.   Original Report Authenticated By: Florencia Reasons, M.D.    Ct Head Wo Contrast  07/10/2012  *RADIOLOGY REPORT*  Clinical Data: Dementia.  Hypertensive.  Breast cancer.  Fatigue.  CT HEAD WITHOUT CONTRAST  Technique:  Contiguous axial images were obtained from the base of the skull through the vertex without contrast.  Comparison: 06/30/2012.  Findings: No intracranial hemorrhage.  Global atrophy without hydrocephalus.  Small vessel disease type changes without CT evidence of large acute infarct.  No intracranial mass lesion detected on this unenhanced exam.  No skull base abnormality or skull fracture.  Visualized sinuses and mastoid air cells are clear.  IMPRESSION: No acute intracranial abnormality  noted.   Original Report Authenticated By: Fuller Canada, M.D.    Dg Chest Port 1 View  07/11/2012  *RADIOLOGY REPORT*  Clinical Data: Weakness.  Recent pneumonia .  PORTABLE CHEST - 1 VIEW  Comparison: 1 day prior  Findings: Midline trachea.  Mild cardiomegaly with tortuous thoracic aorta. No pleural effusion or pneumothorax.  Mildly low lung volumes.  Slight increase in right base air space disease. Suspect developing mild left base air space disease. No congestive failure.  IMPRESSION: Increase in right base atelectasis versus early infection.  Suspect developing left base atelectasis.  Cardiomegaly without congestive failure.   Original Report Authenticated By: Consuello Bossier, M.D.       Assessment/Plan   1. Generalized weakness with change in chest x-ray findings. She may have atelectasis versus pneumonia. 2. Moderate  to severe dementia. 3. Hypertension. 4. Probable Parkinson's disease. Plan: 1. Admit to medical floor. 2. Empirically start intravenous antibiotics. 3. IV fluids. 4. Physical therapy evaluation. Further recommendations will depend on patient's hospital progress.  Code Status: DO NOT RESUSCITATE, from previous admission.   Family Communication: Discussed plan with family at bedside.   Disposition Plan: Depending on hospital progress.   Time spent: 30 minutes.  Wilson Singer [facility] Pager [phone].  If 7PM-7AM, please contact night-coverage [email or web address] Password [email or web address] 07/11/2012, 5:00 PM

## 2012-07-11 NOTE — ED Notes (Signed)
Pt has noted Left arm edema that family states has been like that since mastectomy 15 yrs prior. Pulses equal.

## 2012-07-11 NOTE — Progress Notes (Signed)
2:43 PM  Date: 07/11/2012  Rate: 50  Rhythm: sinus bradycardia  QRS Axis: normal  Intervals: normal QRS:  Poor R wave progression in precordial leads suggests old anterior myocardial infarction.  ST/T Wave abnormalities: normal  Conduction Disutrbances:none  Narrative Interpretation: Abnormal EKG  Old EKG Reviewed: none available

## 2012-07-11 NOTE — ED Notes (Signed)
Pt awakening, is agitated at times. Pt advocate at bedside at current time.

## 2012-07-11 NOTE — ED Notes (Signed)
Pt was seen in er yesterday, diagnosed with pneumonia, was told to follow up with pcp or er for recheck today, family member reports that pt does seem "a little" better today.

## 2012-07-12 ENCOUNTER — Observation Stay (HOSPITAL_COMMUNITY): Payer: PRIVATE HEALTH INSURANCE

## 2012-07-12 LAB — COMPREHENSIVE METABOLIC PANEL
ALT: 6 U/L (ref 0–35)
AST: 11 U/L (ref 0–37)
Albumin: 2.4 g/dL — ABNORMAL LOW (ref 3.5–5.2)
Alkaline Phosphatase: 74 U/L (ref 39–117)
BUN: 6 mg/dL (ref 6–23)
CO2: 24 mEq/L (ref 19–32)
Calcium: 8.4 mg/dL (ref 8.4–10.5)
Chloride: 107 mEq/L (ref 96–112)
Creatinine, Ser: 0.67 mg/dL (ref 0.50–1.10)
GFR calc Af Amer: 87 mL/min — ABNORMAL LOW (ref >90)
GFR calc non Af Amer: 75 mL/min — ABNORMAL LOW (ref >90)
Glucose, Bld: 90 mg/dL (ref 70–99)
Potassium: 3.4 mEq/L — ABNORMAL LOW (ref 3.5–5.1)
Sodium: 138 mEq/L (ref 135–145)
Total Bilirubin: 0.2 mg/dL — ABNORMAL LOW (ref 0.3–1.2)
Total Protein: 6.2 g/dL (ref 6.0–8.3)

## 2012-07-12 LAB — CBC
HCT: 31.5 % — ABNORMAL LOW (ref 36.0–46.0)
Hemoglobin: 10 g/dL — ABNORMAL LOW (ref 12.0–15.0)
MCH: 22.5 pg — ABNORMAL LOW (ref 26.0–34.0)
MCHC: 31.7 g/dL (ref 30.0–36.0)
MCV: 70.8 fL — ABNORMAL LOW (ref 78.0–100.0)
Platelets: 302 10*3/uL (ref 150–400)
RBC: 4.45 MIL/uL (ref 3.87–5.11)
RDW: 16.6 % — ABNORMAL HIGH (ref 11.5–15.5)
WBC: 6 10*3/uL (ref 4.0–10.5)

## 2012-07-12 MED ORDER — SODIUM CHLORIDE 0.9 % IJ SOLN
INTRAMUSCULAR | Status: AC
  Administered 2012-07-12: 3 mL
  Filled 2012-07-12: qty 3

## 2012-07-12 MED ORDER — BIOTENE DRY MOUTH MT LIQD
15.0000 mL | Freq: Two times a day (BID) | OROMUCOSAL | Status: DC
  Administered 2012-07-12 – 2012-07-14 (×5): 15 mL via OROMUCOSAL

## 2012-07-12 MED ORDER — ALPRAZOLAM 0.5 MG PO TABS
0.5000 mg | ORAL_TABLET | Freq: Three times a day (TID) | ORAL | Status: DC
  Administered 2012-07-12 – 2012-07-13 (×4): 0.5 mg via ORAL
  Filled 2012-07-12 (×5): qty 1

## 2012-07-12 NOTE — Progress Notes (Signed)
UR Chart Review Completed  

## 2012-07-12 NOTE — Progress Notes (Signed)
ANTIBIOTIC CONSULT NOTE - INITIAL  Pharmacy Consult for Renally Adjust Antibiotics Indication: rule out pneumonia  Allergies  Allergen Reactions  . Penicillins Swelling and Rash    Patient Measurements: Height: 5\' 4"  (162.6 cm) Weight: 121 lb 7.6 oz (55.1 kg) IBW/kg (Calculated) : 54.7   Vital Signs: Temp: 97.2 F (36.2 C) (10/25 0610) Temp src: Oral (10/25 0610) BP: 150/100 mmHg (10/25 0610) Pulse Rate: 72  (10/24 2123) Intake/Output from previous day: 10/24 0701 - 10/25 0700 In: 1745.8 [P.O.:150; I.V.:1595.8] Out: 650 [Urine:650] Intake/Output from this shift:    Labs:  Basename 07/12/12 0441 07/11/12 1404 07/10/12 1859  WBC 6.0 8.1 8.5  HGB 10.0* 10.1* 11.5*  PLT 302 338 347  LABCREA -- -- --  CREATININE 0.67 0.88 0.95   Estimated Creatinine Clearance: 40.4 ml/min (by C-G formula based on Cr of 0.67). No results found for this basename: VANCOTROUGH:2,VANCOPEAK:2,VANCORANDOM:2,GENTTROUGH:2,GENTPEAK:2,GENTRANDOM:2,TOBRATROUGH:2,TOBRAPEAK:2,TOBRARND:2,AMIKACINPEAK:2,AMIKACINTROU:2,AMIKACIN:2, in the last 72 hours   Microbiology: Recent Results (from the past 720 hour(s))  URINE CULTURE     Status: Normal   Collection Time   06/30/12 11:50 AM      Component Value Range Status Comment   Specimen Description URINE, CATHETERIZED   Final    Special Requests NONE   Final    Culture  Setup Time 06/30/2012 21:37   Final    Colony Count NO GROWTH   Final    Culture NO GROWTH   Final    Report Status 07/02/2012 FINAL   Final     Medical History: Past Medical History  Diagnosis Date  . Hypertension   . Alzheimer's dementia   . Heart attack   . Parkinson disease   . Cancer     Medications:  Scheduled:    . sodium chloride  1,000 mL Intravenous Once  . acetaminophen  500 mg Oral QHS  . aspirin EC  81 mg Oral q morning - 10a  . benztropine  1 mg Oral Daily  . cefTRIAXone  1 g Intramuscular Once  . citalopram  20 mg Oral q morning - 10a  . cefTRIAXone  (ROCEPHIN) IVPB 1 gram/50 mL D5W      . haloperidol  6 mg Oral BID  . heparin  5,000 Units Subcutaneous Q8H  . levofloxacin (LEVAQUIN) IV  750 mg Intravenous Q48H  . metoprolol  25 mg Oral q morning - 10a  . rivastigmine  9.5 mg Transdermal Daily  . zolpidem  5 mg Oral QHS  . DISCONTD: azithromycin  500 mg Intravenous Q24H  . DISCONTD: haloperidol  6 mg Oral BID  . DISCONTD: levofloxacin (LEVAQUIN) IV  500 mg Intravenous Q24H  . DISCONTD: zolpidem  10 mg Oral QHS   Assessment: Estimated Creatinine Clearance: 40.4 ml/min (by C-G formula based on Cr of 0.67).  Goal of Therapy:  Eradicate infection.  Plan:  Levaquin 750mg  IV every 48 hours. Follow up culture results  [name] 07/12/2012,8:39 AM

## 2012-07-12 NOTE — Evaluation (Signed)
Physical Therapy Evaluation Patient Details Name: [name] MRN: [id number] DOB: 06-Dec-1919 Today's Date: 07/12/2012 Time: 4098-1191 PT Time Calculation (min): 38 min  PT Assessment / Plan / Recommendation Clinical Impression  Pt is still more disoriented than normal, per her daughter.  She is very deconditioned, needing mod assist to transfer OOB  and max assist to ambulate 10'.  Her daughter plans to take her home at d/c, so they will need a w/c for max safety.    PT Assessment  Patient needs continued PT services    Follow Up Recommendations  Home health PT    Does the patient have the potential to tolerate intense rehabilitation      Barriers to Discharge None      Equipment Recommendations  Wheelchair (measurements)    Recommendations for Other Services     Frequency Min 3X/week    Precautions / Restrictions Precautions Precautions: Fall Restrictions Weight Bearing Restrictions: No   Pertinent Vitals/Pain       Mobility  Bed Mobility Supine to Sit: 3: Mod assist Transfers Sit to Stand: 3: Mod assist;From bed Ambulation/Gait Ambulation/Gait Assistance: 2: Max assist Ambulation Distance (Feet): 10 Feet (10x2) Assistive device: 1 person hand held assist Ambulation/Gait Assistance Details: pt has been resistant to using a walker in the past.  She needs one for stability, but hand held assist is the best we can do for now Gait Pattern: Narrow base of support;Shuffle;Trunk flexed;Right flexed knee in stance;Left flexed knee in stance Gait velocity: extremely slow General Gait Details: gait is essentially non-functional...pt stands with very slumped posture and is at risk of knees collapsing during gait Stairs: No Wheelchair Mobility Wheelchair Mobility: No    Shoulder Instructions     Exercises     PT Diagnosis: Difficulty walking  PT Problem List: Decreased strength;Decreased activity tolerance;Decreased mobility;Decreased cognition PT Treatment  Interventions: Gait training   PT Goals Acute Rehab PT Goals PT Goal Formulation: With family Pt will go Supine/Side to Sit: with min assist PT Goal: Supine/Side to Sit - Progress: Goal set today Pt will go Sit to Stand: with min assist PT Goal: Sit to Stand - Progress: Goal set today Pt will go Stand to Sit: with min assist PT Goal: Stand to Sit - Progress: Goal set today Pt will Ambulate: 16 - 50 feet;Other (comment) (with hand held assist) PT Goal: Ambulate - Progress: Goal set today  Visit Information  Last PT Received On: 07/12/12    Subjective Data  Subjective: Pt has been needing more assist for the past 2 weeks Patient Stated Goal: none stated   Prior Functioning  Home Living Lives With: Daughter Available Help at Discharge: Family;Available 24 hours/day Type of Home: House Home Access: Stairs to enter [employer] of Steps: 3 Entrance Stairs-Rails: Right Home Layout: One level Bathroom Shower/Tub: Health visitor: Handicapped height Home Adaptive Equipment: Shower chair with back Prior Function Level of Independence: Needs assistance Needs Assistance: Bathing;Dressing;Grooming;Toileting;Meal Prep;Light Housekeeping Able to Take Stairs?: Yes (single step) Driving: No Vocation: Retired Musician: No difficulties    Cognition  Overall Cognitive Status: History of cognitive impairments - further impaired Arousal/Alertness: Awake/alert Orientation Level: Disoriented X4 Behavior During Session: Restless Cognition - Other Comments: wears mitts to protect medical lines    Extremity/Trunk Assessment Right Lower Extremity Assessment RLE ROM/Strength/Tone: Unable to fully assess;Due to impaired cognition Left Lower Extremity Assessment LLE ROM/Strength/Tone: Unable to fully assess;Due to impaired cognition Trunk Assessment Trunk Assessment: Kyphotic   Balance Balance Balance  Assessed: Yes Static Sitting Balance Static  Sitting - Balance Support: Bilateral upper extremity supported;Feet supported Static Sitting - Level of Assistance: 5: Stand by assistance  End of Session PT - End of Session Equipment Utilized During Treatment: Gait belt Activity Tolerance: Patient limited by fatigue Patient left: in bed;with call bell/phone within reach;with bed alarm set;with family/visitor present Nurse Communication: Mobility status  GP     Konrad Penta 07/12/2012, 3:39 PM

## 2012-07-12 NOTE — Care Management Note (Unsigned)
    Page 1 of 1   07/12/2012     11:52:35 AM   CARE MANAGEMENT NOTE 07/12/2012  Patient:  [name], [name]   Account Number:  [id number]  Date Initiated:  07/12/2012  Documentation initiated by:  Sharrie Rothman  Subjective/Objective Assessment:   Pt admitted from home with pneumonia. Pt lives with her daughter and will return home at discharge. Pt is currently active with [facility] RN and PT.     Action/Plan:   Pt to resume HH with [facility] at discharge. Pts daughter is interested in a wheelchair at discharge. Will continue to follow.   Anticipated DC Date:  07/13/2012   Anticipated DC Plan:  HOME W HOME HEALTH SERVICES      DC Planning Services  CM consult      [facility] Choice  HOME HEALTH  Resumption Of Svcs/PTA Provider   Choice offered to / List presented to:  C-4 Adult Children        HH arranged  HH-1 RN  HH-2 PT      [facility] agency  [facility]   Status of service:  In process, will continue to follow Medicare Important Message given?   (If response is "NO", the following Medicare IM given date fields will be blank) Date Medicare IM given:   Date Additional Medicare IM given:    Discharge Disposition:    Per UR Regulation:    If discussed at Long Length of Stay Meetings, dates discussed:    Comments:  07/12/12 1152 Arlyss Queen, RN BSN CM

## 2012-07-12 NOTE — Progress Notes (Signed)
Subjective: This lady is much the same as yesterday. I discussed her case with daughter at the bedside. She tells me that she was doing reasonably well from the last discharge but the last few days she's declined again. She wonders whether her Parkinson's is an issue. Patient has not had a fever. There is no cough. There does not appear to be in increased work of breathing.           Physical Exam: Blood pressure 150/100, pulse 72, temperature 97.2 F (36.2 C), temperature source Oral, resp. rate 16, height 5\' 4"  (1.626 m), weight 55.1 kg (121 lb 7.6 oz), SpO2 92.00%. Agitated. Heart sounds are present and in sinus rhythm clinically. Lung fields are clinically clear. She is alert but not particularly communicative. No obvious focal neurological signs.   Investigations:  Recent Results (from the past 240 hour(s))  CULTURE, BLOOD (ROUTINE X 2)     Status: Normal (Preliminary result)   Collection Time   07/11/12  5:02 PM      Component Value Range Status Comment   Specimen Description BLOOD RIGHT HAND   Final    Special Requests BOTTLES DRAWN AEROBIC AND ANAEROBIC 5CC   Final    Culture NO GROWTH 1 DAY   Final    Report Status PENDING   Incomplete   CULTURE, BLOOD (ROUTINE X 2)     Status: Normal (Preliminary result)   Collection Time   07/11/12  5:47 PM      Component Value Range Status Comment   Specimen Description BLOOD RIGHT ANTECUBITAL   Final    Special Requests BOTTLES DRAWN AEROBIC AND ANAEROBIC 8CC   Final    Culture NO GROWTH 1 DAY   Final    Report Status PENDING   Incomplete      Basic Metabolic Panel:  Basename 07/12/12 0441 07/11/12 1404  NA 138 135  K 3.4* 3.6  CL 107 101  CO2 24 28  GLUCOSE 90 93  BUN 6 9  CREATININE 0.67 0.88  CALCIUM 8.4 8.5  MG -- --  PHOS -- --   Liver Function Tests:  [facility] 07/12/12 0441  AST 11  ALT 6  ALKPHOS 74  BILITOT 0.2*  PROT 6.2  ALBUMIN 2.4*     CBC:  Basename 07/12/12 0441 07/11/12 1404  07/10/12 1859  WBC 6.0 8.1 --  NEUTROABS -- -- 5.5  HGB 10.0* 10.1* --  HCT 31.5* 31.2* --  MCV 70.8* 70.7* --  PLT 302 338 --    Dg Chest 1 View  07/10/2012  *RADIOLOGY REPORT*  Clinical Data: Fatigue.  CHEST - 1 VIEW  Comparison: Chest x-ray 06/30/2012.  Findings: Lung volumes are low. Ill-defined medial right basilar opacity.  No pleural effusions.  Pulmonary vasculature is normal. Mild cardiomegaly is unchanged. The patient is rotated to the right on today's exam, resulting in distortion of the mediastinal contours and reduced diagnostic sensitivity and specificity for mediastinal pathology.  Atherosclerosis in the thoracic aorta. Unusual opacity projecting over the right apex is favored to represent overlying soft tissues.  IMPRESSION: 1.  Low lung volumes with ill-defined medial right basilar opacity which may represent atelectasis and/or consolidation. 2.  Mild cardiomegaly is unchanged. 3.  Atherosclerosis.   Original Report Authenticated By: Florencia Reasons, M.D.    Ct Head Wo Contrast  07/10/2012  *RADIOLOGY REPORT*  Clinical Data: Dementia.  Hypertensive.  Breast cancer.  Fatigue.  CT HEAD WITHOUT CONTRAST  Technique:  Contiguous axial  images were obtained from the base of the skull through the vertex without contrast.  Comparison: 06/30/2012.  Findings: No intracranial hemorrhage.  Global atrophy without hydrocephalus.  Small vessel disease type changes without CT evidence of large acute infarct.  No intracranial mass lesion detected on this unenhanced exam.  No skull base abnormality or skull fracture.  Visualized sinuses and mastoid air cells are clear.  IMPRESSION: No acute intracranial abnormality noted.   Original Report Authenticated By: Fuller Canada, M.D.    Dg Chest Port 1 View  07/11/2012  *RADIOLOGY REPORT*  Clinical Data: Weakness.  Recent pneumonia .  PORTABLE CHEST - 1 VIEW  Comparison: 1 day prior  Findings: Midline trachea.  Mild cardiomegaly with tortuous thoracic  aorta. No pleural effusion or pneumothorax.  Mildly low lung volumes.  Slight increase in right base air space disease. Suspect developing mild left base air space disease. No congestive failure.  IMPRESSION: Increase in right base atelectasis versus early infection.  Suspect developing left base atelectasis.  Cardiomegaly without congestive failure.   Original Report Authenticated By: Consuello Bossier, M.D.       Medications: I have reviewed the patient's current medications.  Impression: 1. Possible community-acquired pneumonia although clinically she does not seem to present in this manner. 2. Moderate to severe dementia. 3. Possible Parkinson's disease.     Plan: 1. Continue with current therapy. 2. Repeat chest x-ray. 3. Discontinue IV fluids and encourage oral intake. 4. Neurology consultation. 5. Physical therapy consultation.     LOS: 1 day   Wilson Singer Pager [phone]  07/12/2012, 10:49 AM

## 2012-07-13 LAB — COMPREHENSIVE METABOLIC PANEL
ALT: 6 U/L (ref 0–35)
AST: 11 U/L (ref 0–37)
Albumin: 2.4 g/dL — ABNORMAL LOW (ref 3.5–5.2)
Alkaline Phosphatase: 73 U/L (ref 39–117)
BUN: 5 mg/dL — ABNORMAL LOW (ref 6–23)
CO2: 25 mEq/L (ref 19–32)
Calcium: 8.7 mg/dL (ref 8.4–10.5)
Chloride: 107 mEq/L (ref 96–112)
Creatinine, Ser: 0.63 mg/dL (ref 0.50–1.10)
GFR calc Af Amer: 89 mL/min — ABNORMAL LOW (ref >90)
GFR calc non Af Amer: 77 mL/min — ABNORMAL LOW (ref >90)
Glucose, Bld: 98 mg/dL (ref 70–99)
Potassium: 3.4 mEq/L — ABNORMAL LOW (ref 3.5–5.1)
Sodium: 138 mEq/L (ref 135–145)
Total Bilirubin: 0.3 mg/dL (ref 0.3–1.2)
Total Protein: 6.1 g/dL (ref 6.0–8.3)

## 2012-07-13 LAB — CBC
HCT: 31 % — ABNORMAL LOW (ref 36.0–46.0)
Hemoglobin: 9.9 g/dL — ABNORMAL LOW (ref 12.0–15.0)
MCH: 22.7 pg — ABNORMAL LOW (ref 26.0–34.0)
MCHC: 31.9 g/dL (ref 30.0–36.0)
MCV: 71.1 fL — ABNORMAL LOW (ref 78.0–100.0)
Platelets: 285 10*3/uL (ref 150–400)
RBC: 4.36 MIL/uL (ref 3.87–5.11)
RDW: 17 % — ABNORMAL HIGH (ref 11.5–15.5)
WBC: 5.4 10*3/uL (ref 4.0–10.5)

## 2012-07-13 LAB — STREP PNEUMONIAE URINARY ANTIGEN: Strep Pneumo Urinary Antigen: NEGATIVE

## 2012-07-13 MED ORDER — LEVOFLOXACIN 250 MG PO TABS
750.0000 mg | ORAL_TABLET | Freq: Once | ORAL | Status: AC
  Administered 2012-07-13: 750 mg via ORAL
  Filled 2012-07-13 (×2): qty 1

## 2012-07-13 NOTE — Progress Notes (Signed)
07/13/12 1022 Patient alert and oriented to self this morning, stated was "tired and want to rest", did not want breakfast. Daughter, [name], at bedside at this time, states has not been able to get patient to wake up enough to eat. Patient arousable and alert to self, appears sleepy, states "i feel bad" but denies pain. Appears comfortable. Vital signs stable, notified Dr Karilyn Cota of patient status and daughters concerns regarding discharge. Also notified of morning meds not given at this time d/t patient sleepy, stated okay and would see patient and speak with daughter. Nursing to monitor.  Earnstine Regal, RN

## 2012-07-13 NOTE — Discharge Summary (Addendum)
Physician Discharge Summary  [name] AVW:[id number] DOB: 1920/05/19 DOA: 07/11/2012  PCP: Evlyn Courier, MD  Admit date: 07/11/2012 Discharge date: 07/14/2012  Time spent: Less than 30 minutes  Recommendations for Outpatient Follow-up:  1. Home health physical therapy, RN, aide.  2. Consider neurology consultation as an outpatient.  Discharge Diagnoses: 1. Possible community-acquired pneumonia versus atelectasis. Repeat chest x-rays failed to confirm definite evidence of pneumonia. There was no fever or leukocytosis. Patient did not have any symptoms referable to pneumonia. 2. Moderate to severe dementia.  3. Anemia of chronic disease. 4. DO NOT RESUSCITATE CODE STATUS.   Discharge Condition: Stable.  Diet recommendation: As tolerated, regular.  Filed Weights   07/11/12 1825  Weight: 55.1 kg (121 lb 7.6 oz)    History of present illness:  This 78 year old lady presents to the hospital once again with symptoms of generalized weakness. Please see initial history as outlined below: HPI: [name] is a 78 y.o. female presents once again with the above symptoms for the last few days. She was recently hospitalized 11 days ago with eventual diagnosis of probable pneumonia. However, she made a very quick recovery and was able to be discharged home. Once again, she has similar symptoms of generalized weakness without cough, dyspnea or fever. She does have moderate to severe dementia. She is not able to give me any kind of history. Family members, which include a brother and sister-in-law, also are unable to give me any clear history.  Hospital Course:  Patient was admitted in started on intravenous antibiotics with the presumption diagnosis of pneumonia. She was also given intravenous fluids. She made a good improvement. Repeat chest x-rays failed to definitely confirm the presence of a pneumonia but rather more likely that she has atelectasis. This is in the setting of declining  functional status due to progression of her dementia. Her Parkinson's may need followup further with neurology..  Procedures:  None.   Consultations:  None.  Discharge Exam: Filed Vitals:   07/12/12 0610 07/12/12 1441 07/12/12 2144 07/13/12 0608  BP: 150/100 135/64 141/79 114/74  Pulse:   66 55  Temp: 97.2 F (36.2 C)  99.1 F (37.3 C) 97.7 F (36.5 C)  TempSrc: Oral  Axillary Axillary  Resp: 16  16 16   Height:      Weight:      SpO2: 92%  100% 100%    General: She does look systemically well. There is no evidence of sepsis. Cardiovascular: Heart sounds are present and normal without murmurs. Respiratory: Lung fields are clinically clear anteriorly. She is more alert today.  Discharge Instructions  Discharge Orders    Future Orders Please Complete By Expires   Diet - low sodium heart healthy      Increase activity slowly          Medication List     As of 07/13/2012  8:00 AM    STOP taking these medications         bumetanide 0.5 MG tablet   Commonly known as: BUMEX      moxifloxacin 400 MG tablet   Commonly known as: AVELOX      TAKE these medications         acetaminophen 500 MG tablet   Commonly known as: TYLENOL   Take 500 mg by mouth at bedtime. For pain      ALPRAZolam 0.5 MG tablet   Commonly known as: XANAX   Take 0.5 mg by mouth 3 (three) times daily  as needed. For anxiety      aspirin EC 81 MG tablet   Take 81 mg by mouth every morning.      benztropine 1 MG tablet   Commonly known as: COGENTIN   Take 1 mg by mouth daily. At 1400 (2pm)      citalopram 20 MG tablet   Commonly known as: CELEXA   Take 20 mg by mouth every morning.      diclofenac sodium 1 % Gel   Commonly known as: VOLTAREN   Apply topically as needed. For arm pain      haloperidol 2 MG/ML solution   Commonly known as: HALDOL   Take 6 mg by mouth 2 (two) times daily.      levofloxacin 750 MG tablet   Commonly known as: LEVAQUIN   Take 1 tablet (750 mg total)  by mouth daily.      metoprolol 50 MG tablet   Commonly known as: LOPRESSOR   Take 25 mg by mouth every morning.      rivastigmine 9.5 mg/24hr   Commonly known as: EXELON   Place 1 patch onto the skin daily. *Remove and discard used patches*      zolpidem 10 MG tablet   Commonly known as: AMBIEN   Take 10 mg by mouth at bedtime.          The results of significant diagnostics from this hospitalization (including imaging, microbiology, ancillary and laboratory) are listed below for reference.    Significant Diagnostic Studies: Dg Chest 1 View  07/12/2012  *RADIOLOGY REPORT*  Clinical Data: Follow up of possible pneumonia.  CHEST - 1 VIEW  Comparison: 1 day prior  Findings: Portable minimally motion degraded AP view.  Patient rotated to the left.  The chin overlies the left apex. Cardiomegaly accentuated by AP portable technique.  Possible trace bilateral pleural fluid. No pneumothorax.  No congestive failure.  Improved right base aeration with persistent patchy left base air space disease.  IMPRESSION:  1.  Cardiomegaly with low lung volumes but no congestive failure. 2.  Possible small bilateral pleural effusions. 3.  Mild motion degradation. 4.  Improved right base aeration.  Persistent left base airspace disease, likely atelectasis.   Original Report Authenticated By: Consuello Bossier, M.D.    Dg Chest 1 View  07/10/2012  *RADIOLOGY REPORT*  Clinical Data: Fatigue.  CHEST - 1 VIEW  Comparison: Chest x-ray 06/30/2012.  Findings: Lung volumes are low. Ill-defined medial right basilar opacity.  No pleural effusions.  Pulmonary vasculature is normal. Mild cardiomegaly is unchanged. The patient is rotated to the right on today's exam, resulting in distortion of the mediastinal contours and reduced diagnostic sensitivity and specificity for mediastinal pathology.  Atherosclerosis in the thoracic aorta. Unusual opacity projecting over the right apex is favored to represent overlying soft  tissues.  IMPRESSION: 1.  Low lung volumes with ill-defined medial right basilar opacity which may represent atelectasis and/or consolidation. 2.  Mild cardiomegaly is unchanged. 3.  Atherosclerosis.   Original Report Authenticated By: Florencia Reasons, M.D.    Dg Chest 1 View  06/30/2012  *RADIOLOGY REPORT*  Clinical Data: Cough.  Infiltrates.  CHF.  Free air.  CHEST - 1 VIEW  Comparison: 02/16/2011.  Findings: Low volume chest.  Patchy density is present at the right lung base which may represent atelectasis or airspace disease.  No free air underneath the hemidiaphragms identified.  Low volumes accentuate the size of the cardiopericardial silhouette.  Tortuous thoracic aorta  with aortic atherosclerosis.  Left lung apex excluded from view.  IMPRESSION: Low volume chest with patchy right basilar density which may represent airspace disease or atelectasis.   Original Report Authenticated By: Andreas Newport, M.D.    Ct Head Wo Contrast  07/10/2012  *RADIOLOGY REPORT*  Clinical Data: Dementia.  Hypertensive.  Breast cancer.  Fatigue.  CT HEAD WITHOUT CONTRAST  Technique:  Contiguous axial images were obtained from the base of the skull through the vertex without contrast.  Comparison: 06/30/2012.  Findings: No intracranial hemorrhage.  Global atrophy without hydrocephalus.  Small vessel disease type changes without CT evidence of large acute infarct.  No intracranial mass lesion detected on this unenhanced exam.  No skull base abnormality or skull fracture.  Visualized sinuses and mastoid air cells are clear.  IMPRESSION: No acute intracranial abnormality noted.   Original Report Authenticated By: Fuller Canada, M.D.    Ct Head Wo Contrast  06/30/2012  *RADIOLOGY REPORT*  Clinical Data: Dizziness and weakness.  CT HEAD WITHOUT CONTRAST  Technique:  Contiguous axial images were obtained from the base of the skull through the vertex without contrast.  Comparison: 02/16/2011  Findings: Motion artifact  limits the study.  No obvious mass effect, midline shift, or acute intracranial hemorrhage.  Mastoid air cells and visualized paranasal sinuses are clear.  Cranium is intact.  Global atrophy and chronic ischemic changes are not significantly changed.  IMPRESSION: No acute intracranial pathology.   Original Report Authenticated By: Donavan Burnet, M.D.    Dg Chest Port 1 View  07/11/2012  *RADIOLOGY REPORT*  Clinical Data: Weakness.  Recent pneumonia .  PORTABLE CHEST - 1 VIEW  Comparison: 1 day prior  Findings: Midline trachea.  Mild cardiomegaly with tortuous thoracic aorta. No pleural effusion or pneumothorax.  Mildly low lung volumes.  Slight increase in right base air space disease. Suspect developing mild left base air space disease. No congestive failure.  IMPRESSION: Increase in right base atelectasis versus early infection.  Suspect developing left base atelectasis.  Cardiomegaly without congestive failure.   Original Report Authenticated By: Consuello Bossier, M.D.     Microbiology: Recent Results (from the past 240 hour(s))  CULTURE, BLOOD (ROUTINE X 2)     Status: Normal (Preliminary result)   Collection Time   07/11/12  5:02 PM      Component Value Range Status Comment   Specimen Description BLOOD RIGHT HAND   Final    Special Requests BOTTLES DRAWN AEROBIC AND ANAEROBIC 5CC   Final    Culture NO GROWTH 2 DAYS   Final    Report Status PENDING   Incomplete   CULTURE, BLOOD (ROUTINE X 2)     Status: Normal (Preliminary result)   Collection Time   07/11/12  5:47 PM      Component Value Range Status Comment   Specimen Description BLOOD RIGHT ANTECUBITAL   Final    Special Requests BOTTLES DRAWN AEROBIC AND ANAEROBIC 8CC   Final    Culture NO GROWTH 2 DAYS   Final    Report Status PENDING   Incomplete      Labs: Basic Metabolic Panel:  Lab 07/13/12 1610 07/12/12 0441 07/11/12 1404 07/10/12 1859  NA 138 138 135 140  K 3.4* 3.4* 3.6 3.8  CL 107 107 101 102  CO2 25 24 28 29     GLUCOSE 98 90 93 110*  BUN 5* 6 9 11   CREATININE 0.63 0.67 0.88 0.95  CALCIUM 8.7 8.4 8.5 9.6  MG -- -- -- --  PHOS -- -- -- --   Liver Function Tests:  Lab 07/13/12 0649 07/12/12 0441  AST 11 11  ALT 6 6  ALKPHOS 73 74  BILITOT 0.3 0.2*  PROT 6.1 6.2  ALBUMIN 2.4* 2.4*     CBC:  Lab 07/13/12 0649 07/12/12 0441 07/11/12 1404 07/10/12 1859  WBC 5.4 6.0 8.1 8.5  NEUTROABS -- -- -- 5.5  HGB 9.9* 10.0* 10.1* 11.5*  HCT 31.0* 31.5* 31.2* 35.4*  MCV 71.1* 70.8* 70.7* 70.4*  PLT 285 302 338 347   Cardiac Enzymes:  Lab 07/11/12 1404 07/10/12 1859  CKTOTAL -- --  CKMB -- --  CKMBINDEX -- --  TROPONINI <0.30 <0.30         Signed:  GOSRANI,NIMISH C  [facility] 07/13/2012, 8:00 AM

## 2012-07-13 NOTE — Progress Notes (Signed)
07/13/12 1814 Patient assisted up to bathroom this evening per NT assist, voided independently and had stool as well.  Ambulated back to chair with nurse assist, tolerated well, general weakness. Preferred to sit up in chair, chair alarm on for safety, discussed with daughter at bedside. Pt positioned with pillows for comfort. Daughter, Elois, stated "i'm taking you home in the morning", very pleased with patient being more alert and able to sit up and ambulate in room this evening. Notified Dr Karilyn Cota of status. Earnstine Regal, RN

## 2012-07-13 NOTE — Progress Notes (Signed)
Pt had not voided throughout the night. Bladder scan performed showing around 500 cc of urine in bladder. MD paged and orders given to perform I&O Cath. I&O Cath performed using sterile technique with 600cc of cloudy urine returned. Specimen sent to lab.

## 2012-07-13 NOTE — Progress Notes (Signed)
07/13/12 1739 Patient more alert this afternoon, able to administer po medications this afternoon and notified Dr Karilyn Cota. Also notified morning dose of haldol not given later this afternoon d/t next scheduled dose at bedtime, stated okay. Notified of patient unable to void when assisted up to bedside commode, bladder scan volume >200 ml with multiple attempts. Stated ok to in and out cath. In and out cath obtained 250 ml dark yellow urine. Pt tolerated well. Room air O2 sat obtained today d/t possible discharge home tomorrow, O2 sats have been stable at 94-95% on r/a today. Discussed with daughter who remains at bedside at this time. Earnstine Regal, RN

## 2012-07-14 DIAGNOSIS — J189 Pneumonia, unspecified organism: Secondary | ICD-10-CM

## 2012-07-14 LAB — LEGIONELLA ANTIGEN, URINE: Legionella Antigen, Urine: NEGATIVE

## 2012-07-14 NOTE — Progress Notes (Signed)
This lady has done relatively well overnight. She has been stable. She is alert this morning. Her vital signs are stable. She should be able to be discharged home this morning. There are no changes to the discharge summary or medications.

## 2012-07-14 NOTE — Progress Notes (Signed)
[facility] called and informed of patient discharge.  Patient and family state understanding of discharge.

## 2012-07-17 LAB — CULTURE, BLOOD (ROUTINE X 2)
Culture: NO GROWTH
Culture: NO GROWTH

## 2012-07-26 ENCOUNTER — Emergency Department (HOSPITAL_COMMUNITY): Payer: PRIVATE HEALTH INSURANCE

## 2012-07-26 ENCOUNTER — Emergency Department (HOSPITAL_COMMUNITY)
Admission: EM | Admit: 2012-07-26 | Discharge: 2012-07-26 | Disposition: A | Discharge status: Home / Self Care | Payer: PRIVATE HEALTH INSURANCE | Attending: Emergency Medicine | Admitting: Emergency Medicine

## 2012-07-26 ENCOUNTER — Encounter (HOSPITAL_COMMUNITY): Payer: Self-pay | Admitting: *Deleted

## 2012-07-26 DIAGNOSIS — G20A1 Parkinson's disease without dyskinesia, without mention of fluctuations: Secondary | ICD-10-CM | POA: Insufficient documentation

## 2012-07-26 DIAGNOSIS — G309 Alzheimer's disease, unspecified: Secondary | ICD-10-CM | POA: Insufficient documentation

## 2012-07-26 DIAGNOSIS — Z79899 Other long term (current) drug therapy: Secondary | ICD-10-CM | POA: Insufficient documentation

## 2012-07-26 DIAGNOSIS — G20 Parkinson's disease: Secondary | ICD-10-CM | POA: Insufficient documentation

## 2012-07-26 DIAGNOSIS — Y939 Activity, unspecified: Secondary | ICD-10-CM | POA: Insufficient documentation

## 2012-07-26 DIAGNOSIS — F0280 Dementia in other diseases classified elsewhere without behavioral disturbance: Secondary | ICD-10-CM | POA: Insufficient documentation

## 2012-07-26 DIAGNOSIS — Z043 Encounter for examination and observation following other accident: Secondary | ICD-10-CM | POA: Insufficient documentation

## 2012-07-26 DIAGNOSIS — I252 Old myocardial infarction: Secondary | ICD-10-CM | POA: Insufficient documentation

## 2012-07-26 DIAGNOSIS — W06XXXA Fall from bed, initial encounter: Secondary | ICD-10-CM | POA: Insufficient documentation

## 2012-07-26 DIAGNOSIS — Z7982 Long term (current) use of aspirin: Secondary | ICD-10-CM | POA: Insufficient documentation

## 2012-07-26 DIAGNOSIS — Y92009 Unspecified place in unspecified non-institutional (private) residence as the place of occurrence of the external cause: Secondary | ICD-10-CM | POA: Insufficient documentation

## 2012-07-26 DIAGNOSIS — Z791 Long term (current) use of non-steroidal anti-inflammatories (NSAID): Secondary | ICD-10-CM | POA: Insufficient documentation

## 2012-07-26 DIAGNOSIS — I10 Essential (primary) hypertension: Secondary | ICD-10-CM | POA: Insufficient documentation

## 2012-07-26 DIAGNOSIS — W19XXXA Unspecified fall, initial encounter: Secondary | ICD-10-CM

## 2012-07-26 DIAGNOSIS — Z859 Personal history of malignant neoplasm, unspecified: Secondary | ICD-10-CM | POA: Insufficient documentation

## 2012-07-26 NOTE — ED Provider Notes (Signed)
History     CSN: [id number]  Arrival date & time 07/26/12  1104   First MD Initiated Contact with Patient 07/26/12 1233      Chief Complaint  Patient presents with  . Fall    (Consider location/radiation/quality/duration/timing/severity/associated sxs/prior treatment) Patient is a 78 y.o. female presenting with fall. The history is provided by a relative. The history is limited by the condition of the patient (dementia).  Fall  Family checks in on her and she was resting comfortably in bed. They came back a half hour later and the patient was on the floor. Patient does not have any complaints. She has been getting physical therapy following an episode of pneumonia, and had not been able to get out of bed on her on until today.  Past Medical History  Diagnosis Date  . Hypertension   . Alzheimer's dementia   . Heart attack   . Parkinson disease   . Cancer     Past Surgical History  Procedure Date  . Breast surgery   . Abdominal hysterectomy   . Mastectomy   . Small intestine removed   . Tonsillectomy   . Cholecystectomy   . Colon surgery     No family history on file.  History  Substance Use Topics  . Smoking status: Never Smoker   . Smokeless tobacco: Not on file  . Alcohol Use: No    OB History    Grav Para Term Preterm Abortions TAB SAB Ect Mult Living                  Review of Systems  Unable to perform ROS: Dementia    Allergies  Penicillins  Home Medications   Current Outpatient Rx  Name  Route  Sig  Dispense  Refill  . ACETAMINOPHEN 500 MG PO TABS   Oral   Take 500 mg by mouth at bedtime. For pain         . ALPRAZOLAM 0.5 MG PO TABS   Oral   Take 0.5 mg by mouth 3 (three) times daily as needed. For anxiety         . ASPIRIN EC 81 MG PO TBEC   Oral   Take 81 mg by mouth every morning.          Marland Kitchen BENZTROPINE MESYLATE 1 MG PO TABS   Oral   Take 1 mg by mouth daily. At 1400 (2pm)         . CITALOPRAM HYDROBROMIDE 20 MG PO  TABS   Oral   Take 20 mg by mouth every morning.          Marland Kitchen DICLOFENAC SODIUM 1 % TD GEL   Topical   Apply topically as needed. For arm pain         . HALOPERIDOL LACTATE 2 MG/ML PO CONC   Oral   Take 6 mg by mouth 2 (two) times daily.         Marland Kitchen LEVOFLOXACIN 750 MG PO TABS   Oral   Take 1 tablet (750 mg total) by mouth daily.   7 tablet   0   . METOPROLOL TARTRATE 50 MG PO TABS   Oral   Take 25 mg by mouth every morning.          Marland Kitchen RIVASTIGMINE 9.5 MG/24HR TD PT24   Transdermal   Place 1 patch onto the skin daily. *Remove and discard used patches*         . ZOLPIDEM  TARTRATE 10 MG PO TABS   Oral   Take 10 mg by mouth at bedtime.           BP 142/100  Pulse 68  Temp 97.5 F (36.4 C) (Oral)  Resp 16  SpO2 94%  Physical Exam  Nursing note and vitals reviewed. 78 year old female, resting comfortably and in no acute distress. Vital signs are significant for mild hypertension with blood pressure 142/100. Oxygen saturation is 94%, which is normal. Head is normocephalic and atraumatic. PERRLA, EOMI. Oropharynx is clear. Neck is immobilized in a stiff cervical collar, nontender, without adenopathy or JVD. Back is nontender and there is no CVA tenderness. Lungs are clear without rales, wheezes, or rhonchi. Chest is nontender. Heart has regular rate and rhythm without murmur. Abdomen is soft, flat, nontender without masses or hepatosplenomegaly and peristalsis is normoactive. Extremities have no cyanosis or edema, full range of motion is present. Skin is warm and dry without rash. Neurologic: She is awake, alert, oriented to person. She is not oriented to place or time. Cranial nerves are intact, there are no motor or sensory deficits. Prominent resting tremor of Parkinson's disease is present as is cogwheel rigidity.   ED Course  Procedures (including critical care time)  Ct Head Wo Contrast  07/26/2012  *RADIOLOGY REPORT*  Clinical Data:  Fall.  CT HEAD  WITHOUT CONTRAST CT CERVICAL SPINE WITHOUT CONTRAST  Technique:  Multidetector CT imaging of the head and cervical spine was performed following the standard protocol without intravenous contrast.  Multiplanar CT image reconstructions of the cervical spine were also generated.  Comparison:  07/10/2012  CT HEAD  Findings: Age related volume loss throughout the cerebral hemispheres bilaterally. No acute intracranial abnormality. Specifically, no hemorrhage, hydrocephalus, mass lesion, acute infarction, or significant intracranial injury.  No acute calvarial abnormality. Visualized paranasal sinuses and mastoids clear. Orbital soft tissues unremarkable.  IMPRESSION: No acute intracranial abnormality.  CT CERVICAL SPINE  Findings: Degenerative disc disease and facet disease.  Diffuse osteopenia.  Prevertebral soft tissues are normal.  No fracture. No epidural or paraspinal hematoma.  IMPRESSION: No acute bony abnormality.   Original Report Authenticated By: Charlett Nose, M.D.    Ct Cervical Spine Wo Contrast  07/26/2012  *RADIOLOGY REPORT*  Clinical Data:  Fall.  CT HEAD WITHOUT CONTRAST CT CERVICAL SPINE WITHOUT CONTRAST  Technique:  Multidetector CT imaging of the head and cervical spine was performed following the standard protocol without intravenous contrast.  Multiplanar CT image reconstructions of the cervical spine were also generated.  Comparison:  07/10/2012  CT HEAD  Findings: Age related volume loss throughout the cerebral hemispheres bilaterally. No acute intracranial abnormality. Specifically, no hemorrhage, hydrocephalus, mass lesion, acute infarction, or significant intracranial injury.  No acute calvarial abnormality. Visualized paranasal sinuses and mastoids clear. Orbital soft tissues unremarkable.  IMPRESSION: No acute intracranial abnormality.  CT CERVICAL SPINE  Findings: Degenerative disc disease and facet disease.  Diffuse osteopenia.  Prevertebral soft tissues are normal.  No fracture. No  epidural or paraspinal hematoma.  IMPRESSION: No acute bony abnormality.   Original Report Authenticated By: Charlett Nose, M.D.      1. Fall       MDM  Fall without apparent injury. CT of the head and cervical spine will be checked to rule out occult injury.        Dione Booze, MD 07/26/12 1324

## 2012-07-26 NOTE — ED Notes (Signed)
Un witnessed fall per EMS, Per family, last known ok was at 0900. Pt found on floor. No c/o pain at this time.

## 2012-07-26 NOTE — ED Notes (Signed)
Pt presents via EMS secondary to an un-witnessed fall at home. EMS states family denies LOC. Pt received on back-board with c-collar intact. Pt is attempting to get off back board.  Pt is confused as she has dementia. No deformities noted at this time. Pt is noted to have dry feces on bilateral legs, peri area and buttocks. NAD noted.

## 2012-07-26 NOTE — ED Notes (Signed)
Bed alarm placed under pt per safety.

## 2012-07-26 NOTE — ED Notes (Signed)
Pt removed from back board per protocol. No deformity or break down noted.  Pt log rolled from back board , tolerated well.

## 2012-07-26 NOTE — ED Notes (Signed)
Pt attempting to climb out of bed. Family at bedside. Bed alarm sounding at this time.  Pt easily distracted.

## 2013-01-31 ENCOUNTER — Encounter (HOSPITAL_COMMUNITY): Payer: Self-pay | Admitting: *Deleted

## 2013-01-31 ENCOUNTER — Emergency Department (HOSPITAL_COMMUNITY): Payer: PRIVATE HEALTH INSURANCE

## 2013-01-31 ENCOUNTER — Inpatient Hospital Stay (HOSPITAL_COMMUNITY)
Admission: EM | Admit: 2013-01-31 | Discharge: 2013-02-04 | DRG: 481 | Disposition: A | Discharge status: Skilled Nursing Facility | Payer: PRIVATE HEALTH INSURANCE | Attending: Family Medicine | Admitting: Family Medicine

## 2013-01-31 DIAGNOSIS — F0391 Unspecified dementia with behavioral disturbance: Secondary | ICD-10-CM

## 2013-01-31 DIAGNOSIS — F0390 Unspecified dementia without behavioral disturbance: Secondary | ICD-10-CM

## 2013-01-31 DIAGNOSIS — G20 Parkinson's disease: Secondary | ICD-10-CM

## 2013-01-31 DIAGNOSIS — G20A1 Parkinson's disease without dyskinesia, without mention of fluctuations: Secondary | ICD-10-CM

## 2013-01-31 DIAGNOSIS — F03918 Unspecified dementia, unspecified severity, with other behavioral disturbance: Secondary | ICD-10-CM

## 2013-01-31 DIAGNOSIS — Z853 Personal history of malignant neoplasm of breast: Secondary | ICD-10-CM

## 2013-01-31 DIAGNOSIS — S72009A Fracture of unspecified part of neck of unspecified femur, initial encounter for closed fracture: Secondary | ICD-10-CM

## 2013-01-31 DIAGNOSIS — Z79899 Other long term (current) drug therapy: Secondary | ICD-10-CM

## 2013-01-31 DIAGNOSIS — F0280 Dementia in other diseases classified elsewhere without behavioral disturbance: Secondary | ICD-10-CM | POA: Diagnosis present

## 2013-01-31 DIAGNOSIS — S72001A Fracture of unspecified part of neck of right femur, initial encounter for closed fracture: Secondary | ICD-10-CM

## 2013-01-31 DIAGNOSIS — Z9183 Wandering in diseases classified elsewhere: Secondary | ICD-10-CM | POA: Diagnosis present

## 2013-01-31 DIAGNOSIS — W19XXXA Unspecified fall, initial encounter: Secondary | ICD-10-CM | POA: Diagnosis present

## 2013-01-31 DIAGNOSIS — S72033A Displaced midcervical fracture of unspecified femur, initial encounter for closed fracture: Principal | ICD-10-CM | POA: Diagnosis present

## 2013-01-31 DIAGNOSIS — I10 Essential (primary) hypertension: Secondary | ICD-10-CM | POA: Diagnosis present

## 2013-01-31 DIAGNOSIS — Z9010 Acquired absence of unspecified breast and nipple: Secondary | ICD-10-CM

## 2013-01-31 DIAGNOSIS — Z66 Do not resuscitate: Secondary | ICD-10-CM | POA: Diagnosis present

## 2013-01-31 DIAGNOSIS — F0281 Dementia in other diseases classified elsewhere with behavioral disturbance: Secondary | ICD-10-CM | POA: Diagnosis present

## 2013-01-31 DIAGNOSIS — Y92009 Unspecified place in unspecified non-institutional (private) residence as the place of occurrence of the external cause: Secondary | ICD-10-CM

## 2013-01-31 DIAGNOSIS — F02818 Dementia in other diseases classified elsewhere, unspecified severity, with other behavioral disturbance: Secondary | ICD-10-CM | POA: Diagnosis present

## 2013-01-31 DIAGNOSIS — I252 Old myocardial infarction: Secondary | ICD-10-CM

## 2013-01-31 DIAGNOSIS — R112 Nausea with vomiting, unspecified: Secondary | ICD-10-CM | POA: Diagnosis present

## 2013-01-31 DIAGNOSIS — G309 Alzheimer's disease, unspecified: Secondary | ICD-10-CM | POA: Diagnosis present

## 2013-01-31 DIAGNOSIS — Z880 Allergy status to penicillin: Secondary | ICD-10-CM

## 2013-01-31 LAB — COMPREHENSIVE METABOLIC PANEL
ALT: 25 U/L (ref 0–35)
AST: 23 U/L (ref 0–37)
Albumin: 3.3 g/dL — ABNORMAL LOW (ref 3.5–5.2)
Alkaline Phosphatase: 97 U/L (ref 39–117)
BUN: 13 mg/dL (ref 6–23)
CO2: 28 mEq/L (ref 19–32)
Calcium: 9.4 mg/dL (ref 8.4–10.5)
Chloride: 100 mEq/L (ref 96–112)
Creatinine, Ser: 0.71 mg/dL (ref 0.50–1.10)
GFR calc Af Amer: 85 mL/min — ABNORMAL LOW (ref >90)
GFR calc non Af Amer: 73 mL/min — ABNORMAL LOW (ref >90)
Glucose, Bld: 120 mg/dL — ABNORMAL HIGH (ref 70–99)
Potassium: 4.4 mEq/L (ref 3.5–5.1)
Sodium: 137 mEq/L (ref 135–145)
Total Bilirubin: 0.8 mg/dL (ref 0.3–1.2)
Total Protein: 7.8 g/dL (ref 6.0–8.3)

## 2013-01-31 LAB — CBC WITH DIFFERENTIAL/PLATELET
Basophils Absolute: 0 10*3/uL (ref 0.0–0.1)
Basophils Relative: 0 % (ref 0–1)
Eosinophils Absolute: 0 10*3/uL (ref 0.0–0.7)
Eosinophils Relative: 0 % (ref 0–5)
HCT: 34.1 % — ABNORMAL LOW (ref 36.0–46.0)
Hemoglobin: 10.9 g/dL — ABNORMAL LOW (ref 12.0–15.0)
Lymphocytes Relative: 7 % — ABNORMAL LOW (ref 12–46)
Lymphs Abs: 1 10*3/uL (ref 0.7–4.0)
MCH: 22.3 pg — ABNORMAL LOW (ref 26.0–34.0)
MCHC: 32 g/dL (ref 30.0–36.0)
MCV: 69.9 fL — ABNORMAL LOW (ref 78.0–100.0)
Monocytes Absolute: 1 10*3/uL (ref 0.1–1.0)
Monocytes Relative: 7 % (ref 3–12)
Neutro Abs: 13.2 10*3/uL — ABNORMAL HIGH (ref 1.7–7.7)
Neutrophils Relative %: 86 % — ABNORMAL HIGH (ref 43–77)
Platelets: 323 10*3/uL (ref 150–400)
RBC: 4.88 MIL/uL (ref 3.87–5.11)
RDW: 17.1 % — ABNORMAL HIGH (ref 11.5–15.5)
WBC: 15.3 10*3/uL — ABNORMAL HIGH (ref 4.0–10.5)

## 2013-01-31 LAB — PROTIME-INR
INR: 1.05 (ref 0.00–1.49)
Prothrombin Time: 13.6 seconds (ref 11.6–15.2)

## 2013-01-31 LAB — URINALYSIS, ROUTINE W REFLEX MICROSCOPIC
Bilirubin Urine: NEGATIVE
Glucose, UA: NEGATIVE mg/dL
Ketones, ur: NEGATIVE mg/dL
Leukocytes, UA: NEGATIVE
Nitrite: NEGATIVE
Protein, ur: NEGATIVE mg/dL
Specific Gravity, Urine: 1.01 (ref 1.005–1.030)
Urobilinogen, UA: 0.2 mg/dL (ref 0.0–1.0)
pH: 6.5 (ref 5.0–8.0)

## 2013-01-31 LAB — URINE MICROSCOPIC-ADD ON

## 2013-01-31 LAB — TYPE AND SCREEN
ABO/RH(D): A POS
Antibody Screen: NEGATIVE

## 2013-01-31 LAB — SURGICAL PCR SCREEN
MRSA, PCR: NEGATIVE
Staphylococcus aureus: POSITIVE — AB

## 2013-01-31 LAB — APTT: aPTT: 28 seconds (ref 24–37)

## 2013-01-31 MED ORDER — ASPIRIN EC 81 MG PO TBEC
81.0000 mg | DELAYED_RELEASE_TABLET | Freq: Every morning | ORAL | Status: DC
  Administered 2013-02-02 – 2013-02-04 (×3): 81 mg via ORAL
  Filled 2013-01-31 (×3): qty 1

## 2013-01-31 MED ORDER — MORPHINE SULFATE 2 MG/ML IJ SOLN
0.5000 mg | INTRAMUSCULAR | Status: DC | PRN
  Administered 2013-02-01: 0.5 mg via INTRAVENOUS
  Filled 2013-01-31: qty 1

## 2013-01-31 MED ORDER — BENZTROPINE MESYLATE 1 MG PO TABS
1.0000 mg | ORAL_TABLET | Freq: Every day | ORAL | Status: DC
  Administered 2013-01-31 – 2013-02-03 (×4): 1 mg via ORAL
  Filled 2013-01-31 (×4): qty 1

## 2013-01-31 MED ORDER — POVIDONE-IODINE 10 % EX SOLN
Freq: Once | CUTANEOUS | Status: AC
  Administered 2013-01-31: 22:00:00 via TOPICAL
  Filled 2013-01-31: qty 118

## 2013-01-31 MED ORDER — ALPRAZOLAM 0.5 MG PO TABS
0.5000 mg | ORAL_TABLET | Freq: Three times a day (TID) | ORAL | Status: DC
  Administered 2013-01-31 – 2013-02-04 (×9): 0.5 mg via ORAL
  Filled 2013-01-31 (×10): qty 1

## 2013-01-31 MED ORDER — MUPIROCIN 2 % EX OINT
1.0000 "application " | TOPICAL_OINTMENT | Freq: Two times a day (BID) | CUTANEOUS | Status: DC
  Administered 2013-01-31 – 2013-02-04 (×7): 1 via NASAL
  Filled 2013-01-31 (×3): qty 22

## 2013-01-31 MED ORDER — METOPROLOL TARTRATE 25 MG PO TABS
25.0000 mg | ORAL_TABLET | Freq: Every morning | ORAL | Status: DC
  Administered 2013-02-02 – 2013-02-04 (×3): 25 mg via ORAL
  Filled 2013-01-31 (×3): qty 1

## 2013-01-31 MED ORDER — HYDROCODONE-ACETAMINOPHEN 5-325 MG PO TABS: 1.0000 | ORAL_TABLET | Freq: Four times a day (QID) | ORAL | Status: DC | PRN

## 2013-01-31 MED ORDER — CITALOPRAM HYDROBROMIDE 20 MG PO TABS
20.0000 mg | ORAL_TABLET | Freq: Every morning | ORAL | Status: DC
  Administered 2013-02-02 – 2013-02-04 (×3): 20 mg via ORAL
  Filled 2013-01-31 (×3): qty 1

## 2013-01-31 MED ORDER — ACETAMINOPHEN 500 MG PO TABS
500.0000 mg | ORAL_TABLET | Freq: Every day | ORAL | Status: DC
  Administered 2013-01-31 – 2013-02-03 (×3): 500 mg via ORAL
  Filled 2013-01-31 (×3): qty 1

## 2013-01-31 MED ORDER — PROMETHAZINE HCL 25 MG/ML IJ SOLN: 12.5000 mg | INTRAMUSCULAR | Status: DC | PRN

## 2013-01-31 MED ORDER — ONDANSETRON 8 MG PO TBDP
8.0000 mg | ORAL_TABLET | Freq: Once | ORAL | Status: AC
  Administered 2013-01-31: 8 mg via ORAL
  Filled 2013-01-31: qty 1

## 2013-01-31 MED ORDER — RIVASTIGMINE 9.5 MG/24HR TD PT24
9.5000 mg | MEDICATED_PATCH | Freq: Every day | TRANSDERMAL | Status: DC
  Administered 2013-02-02 – 2013-02-03 (×2): 9.5 mg via TRANSDERMAL
  Filled 2013-01-31 (×6): qty 1

## 2013-01-31 MED ORDER — CHLORHEXIDINE GLUCONATE CLOTH 2 % EX PADS
6.0000 | MEDICATED_PAD | Freq: Every day | CUTANEOUS | Status: DC
  Administered 2013-02-02 – 2013-02-03 (×2): 6 via TOPICAL

## 2013-01-31 MED ORDER — ZOLPIDEM TARTRATE 5 MG PO TABS
5.0000 mg | ORAL_TABLET | Freq: Every day | ORAL | Status: DC
  Administered 2013-01-31 – 2013-02-03 (×3): 5 mg via ORAL
  Filled 2013-01-31 (×3): qty 1

## 2013-01-31 MED ORDER — VANCOMYCIN HCL IN DEXTROSE 1-5 GM/200ML-% IV SOLN
1000.0000 mg | Freq: Once | INTRAVENOUS | Status: AC
  Administered 2013-02-01: 1000 mg via INTRAVENOUS
  Filled 2013-01-31: qty 200

## 2013-01-31 MED ORDER — BUMETANIDE 1 MG PO TABS
0.5000 mg | ORAL_TABLET | Freq: Every day | ORAL | Status: DC
  Administered 2013-02-02 – 2013-02-04 (×3): 0.5 mg via ORAL
  Filled 2013-01-31 (×3): qty 1

## 2013-01-31 MED ORDER — LORAZEPAM 2 MG/ML IJ SOLN
0.5000 mg | Freq: Once | INTRAMUSCULAR | Status: AC
  Administered 2013-01-31: 0.5 mg via INTRAVENOUS
  Filled 2013-01-31: qty 1

## 2013-01-31 NOTE — ED Notes (Signed)
MD at bedside. Dr. Irene Limbo is at bedside

## 2013-01-31 NOTE — ED Notes (Signed)
Pt found in floor by family, unsure if pt hit her head, family states that pt not able to get up and not able to put weight on right leg

## 2013-01-31 NOTE — ED Provider Notes (Signed)
History     CSN: [id number]  Arrival date & time 01/31/13  1134   First MD Initiated Contact with Patient 01/31/13 1214      Chief Complaint  Patient presents with  . Fall    (Consider location/radiation/quality/duration/timing/severity/associated sxs/prior treatment) Patient is a 79 y.o. female presenting with fall. The history is provided by a relative. The history is limited by the condition of the patient (Dementia).  Fall  She was found by family members on the floor. She frequently gets up and wanders around the house and has frequent falls. She is unable to get herself back up, but this is usual for her when she falls. However, today, when family tried to get her up, she vomited several times. She seems to be having pain in her right hip.  Past Medical History  Diagnosis Date  . Hypertension   . Alzheimer's dementia   . Heart attack   . Parkinson disease   . Cancer     Past Surgical History  Procedure Laterality Date  . Breast surgery    . Abdominal hysterectomy    . Mastectomy    . Small intestine removed    . Tonsillectomy    . Cholecystectomy    . Colon surgery      History reviewed. No pertinent family history.  History  Substance Use Topics  . Smoking status: Never Smoker   . Smokeless tobacco: Not on file  . Alcohol Use: No    OB History   Grav Para Term Preterm Abortions TAB SAB Ect Mult Living                  Review of Systems  Unable to perform ROS: Dementia    Allergies  Penicillins  Home Medications   Current Outpatient Rx  Name  Route  Sig  Dispense  Refill  . acetaminophen (TYLENOL) 500 MG tablet   Oral   Take 500 mg by mouth at bedtime. For pain         . ALPRAZolam (XANAX) 0.5 MG tablet   Oral   Take 0.5 mg by mouth 3 (three) times daily as needed. For anxiety         . aspirin EC 81 MG tablet   Oral   Take 81 mg by mouth every morning.          . benztropine (COGENTIN) 1 MG tablet   Oral   Take 1 mg by mouth  daily. At 1400 (2pm)         . citalopram (CELEXA) 20 MG tablet   Oral   Take 20 mg by mouth every morning.          . diclofenac sodium (VOLTAREN) 1 % GEL   Topical   Apply topically as needed. For arm pain         . haloperidol (HALDOL) 2 MG/ML solution   Oral   Take 6 mg by mouth 2 (two) times daily.         Marland Kitchen levofloxacin (LEVAQUIN) 750 MG tablet   Oral   Take 1 tablet (750 mg total) by mouth daily.   7 tablet   0   . metoprolol (LOPRESSOR) 50 MG tablet   Oral   Take 25 mg by mouth every morning.          . rivastigmine (EXELON) 9.5 mg/24hr   Transdermal   Place 1 patch onto the skin daily. *Remove and discard used patches*         .  zolpidem (AMBIEN) 10 MG tablet   Oral   Take 10 mg by mouth at bedtime.           BP 154/103  Pulse 77  Temp(Src) 98.6 F (37 C) (Oral)  Resp 18  Wt 124 lb (56.246 kg)  BMI 21.27 kg/m2  SpO2 97%  Physical Exam  Nursing note and vitals reviewed.  79 year old female, resting comfortably and in no acute distress. Vital signs are significant for hypertension with blood pressure 154/103. Oxygen saturation is 97%, which is normal. Head is normocephalic and atraumatic. PERRLA, EOMI. Oropharynx is clear. Neck is nontender and supple without adenopathy or JVD. Back is nontender and there is no CVA tenderness. Lungs are clear without rales, wheezes, or rhonchi. Chest is nontender. Heart has regular rate and rhythm without murmur. Abdomen is soft, flat, nontender without masses or hepatosplenomegaly and peristalsis is normoactive. Extremities have no cyanosis or edema. There is pain with passive range of motion of the right hip, full range of motion of all other joints without obvious pain. Skin is warm and dry without rash. Neurologic: She is drowsy but arousable, oriented to person and place but not time, cranial nerves are intact, there are no gross motor or sensory deficits. Marked cogwheel rigidity is noted.  ED  Course  Procedures (including critical care time)  Results for orders placed during the hospital encounter of 01/31/13  URINE CULTURE      Result Value Range   Specimen Description URINE, CATHETERIZED     Special Requests NONE     Culture  Setup Time 02/01/2013 01:33     Colony Count NO GROWTH     Culture NO GROWTH     Report Status 02/02/2013 FINAL    SURGICAL PCR SCREEN      Result Value Range   MRSA, PCR NEGATIVE  NEGATIVE   Staphylococcus aureus POSITIVE (*) NEGATIVE  CBC WITH DIFFERENTIAL      Result Value Range   WBC 15.3 (*) 4.0 - 10.5 K/uL   RBC 4.88  3.87 - 5.11 MIL/uL   Hemoglobin 10.9 (*) 12.0 - 15.0 g/dL   HCT 04.5 (*) 40.9 - 81.1 %   MCV 69.9 (*) 78.0 - 100.0 fL   MCH 22.3 (*) 26.0 - 34.0 pg   MCHC 32.0  30.0 - 36.0 g/dL   RDW 91.4 (*) 78.2 - 95.6 %   Platelets 323  150 - 400 K/uL   Neutrophils Relative % 86 (*) 43 - 77 %   Neutro Abs 13.2 (*) 1.7 - 7.7 K/uL   Lymphocytes Relative 7 (*) 12 - 46 %   Lymphs Abs 1.0  0.7 - 4.0 K/uL   Monocytes Relative 7  3 - 12 %   Monocytes Absolute 1.0  0.1 - 1.0 K/uL   Eosinophils Relative 0  0 - 5 %   Eosinophils Absolute 0.0  0.0 - 0.7 K/uL   Basophils Relative 0  0 - 1 %   Basophils Absolute 0.0  0.0 - 0.1 K/uL  COMPREHENSIVE METABOLIC PANEL      Result Value Range   Sodium 137  135 - 145 mEq/L   Potassium 4.4  3.5 - 5.1 mEq/L   Chloride 100  96 - 112 mEq/L   CO2 28  19 - 32 mEq/L   Glucose, Bld 120 (*) 70 - 99 mg/dL   BUN 13  6 - 23 mg/dL   Creatinine, Ser 2.13  0.50 - 1.10 mg/dL   Calcium  9.4  8.4 - 10.5 mg/dL   Total Protein 7.8  6.0 - 8.3 g/dL   Albumin 3.3 (*) 3.5 - 5.2 g/dL   AST 23  0 - 37 U/L   ALT 25  0 - 35 U/L   Alkaline Phosphatase 97  39 - 117 U/L   Total Bilirubin 0.8  0.3 - 1.2 mg/dL   GFR calc non Af Amer 73 (*) >90 mL/min   GFR calc Af Amer 85 (*) >90 mL/min  URINALYSIS, ROUTINE W REFLEX MICROSCOPIC      Result Value Range   Color, Urine YELLOW  YELLOW   APPearance CLEAR  CLEAR   Specific  Gravity, Urine 1.010  1.005 - 1.030   pH 6.5  5.0 - 8.0   Glucose, UA NEGATIVE  NEGATIVE mg/dL   Hgb urine dipstick MODERATE (*) NEGATIVE   Bilirubin Urine NEGATIVE  NEGATIVE   Ketones, ur NEGATIVE  NEGATIVE mg/dL   Protein, ur NEGATIVE  NEGATIVE mg/dL   Urobilinogen, UA 0.2  0.0 - 1.0 mg/dL   Nitrite NEGATIVE  NEGATIVE   Leukocytes, UA NEGATIVE  NEGATIVE  PROTIME-INR      Result Value Range   Prothrombin Time 13.6  11.6 - 15.2 seconds   INR 1.05  0.00 - 1.49  APTT      Result Value Range   aPTT 28  24 - 37 seconds  URINE MICROSCOPIC-ADD ON      Result Value Range   WBC, UA 0-2  <3 WBC/hpf   RBC / HPF 3-6  <3 RBC/hpf   Bacteria, UA RARE  RARE  BASIC METABOLIC PANEL      Result Value Range   Sodium 134 (*) 135 - 145 mEq/L   Potassium 4.0  3.5 - 5.1 mEq/L   Chloride 100  96 - 112 mEq/L   CO2 28  19 - 32 mEq/L   Glucose, Bld 109 (*) 70 - 99 mg/dL   BUN 14  6 - 23 mg/dL   Creatinine, Ser 2.13  0.50 - 1.10 mg/dL   Calcium 8.4  8.4 - 08.6 mg/dL   GFR calc non Af Amer 59 (*) >90 mL/min   GFR calc Af Amer 68 (*) >90 mL/min  CBC WITH DIFFERENTIAL      Result Value Range   WBC 6.4  4.0 - 10.5 K/uL   RBC 4.54  3.87 - 5.11 MIL/uL   Hemoglobin 10.0 (*) 12.0 - 15.0 g/dL   HCT 57.8 (*) 46.9 - 62.9 %   MCV 70.0 (*) 78.0 - 100.0 fL   MCH 22.0 (*) 26.0 - 34.0 pg   MCHC 31.4  30.0 - 36.0 g/dL   RDW 52.8 (*) 41.3 - 24.4 %   Platelets 201  150 - 400 K/uL   Neutrophils Relative % 75  43 - 77 %   Lymphocytes Relative 11 (*) 12 - 46 %   Monocytes Relative 12  3 - 12 %   Eosinophils Relative 2  0 - 5 %   Basophils Relative 0  0 - 1 %   Neutro Abs 4.8  1.7 - 7.7 K/uL   Lymphs Abs 0.7  0.7 - 4.0 K/uL   Monocytes Absolute 0.8  0.1 - 1.0 K/uL   Eosinophils Absolute 0.1  0.0 - 0.7 K/uL   Basophils Absolute 0.0  0.0 - 0.1 K/uL   Smear Review PLATELET COUNT CONFIRMED BY SMEAR    CBC WITH DIFFERENTIAL      Result Value Range  WBC 5.6  4.0 - 10.5 K/uL   RBC 4.21  3.87 - 5.11 MIL/uL    Hemoglobin 9.3 (*) 12.0 - 15.0 g/dL   HCT 45.4 (*) 09.8 - 11.9 %   MCV 70.1 (*) 78.0 - 100.0 fL   MCH 22.1 (*) 26.0 - 34.0 pg   MCHC 31.5  30.0 - 36.0 g/dL   RDW 14.7 (*) 82.9 - 56.2 %   Platelets 215  150 - 400 K/uL   Neutrophils Relative % 57  43 - 77 %   Neutro Abs 3.2  1.7 - 7.7 K/uL   Lymphocytes Relative 21  12 - 46 %   Lymphs Abs 1.2  0.7 - 4.0 K/uL   Monocytes Relative 19 (*) 3 - 12 %   Monocytes Absolute 1.1 (*) 0.1 - 1.0 K/uL   Eosinophils Relative 2  0 - 5 %   Eosinophils Absolute 0.1  0.0 - 0.7 K/uL   Basophils Relative 0  0 - 1 %   Basophils Absolute 0.0  0.0 - 0.1 K/uL  TYPE AND SCREEN      Result Value Range   ABO/RH(D) A POS     Antibody Screen NEG     Sample Expiration 02/03/2013     Dg Hip Complete Right  01/31/2013   *RADIOLOGY REPORT*  Clinical Data: Fall with right hip pain.  RIGHT HIP - COMPLETE 2+ VIEW  Comparison: 02/16/2011  Findings: Surgical changes about the right hemi pelvis.  Mild osteopenia. Femoral heads are located.  Irregularity about the symphysis pubis bilaterally is grossly similar and favored to be degenerative. No acute fracture.  There is degenerative irregularity about the lateral aspect of the right femoral head.  IMPRESSION: Osteopenia and degenerative change. No acute osseous abnormality.   Original Report Authenticated By: Jeronimo Greaves, M.D.   Dg Hip Operative Right  02/01/2013   *RADIOLOGY REPORT*  Clinical Data: Hip pain.  DG OPERATIVE RIGHT HIP  Comparison: MRI and plain films earlier in the day.  Findings: Placement of screw fixation device across the previously described femoral fracture. No acute hardware complication.  IMPRESSION: Internal fixation of subcapital right femoral head fracture.   Original Report Authenticated By: Jeronimo Greaves, M.D.   Ct Head Wo Contrast  01/31/2013   *RADIOLOGY REPORT*  Clinical Data:  Fall.  Mental status change  CT HEAD WITHOUT CONTRAST CT CERVICAL SPINE WITHOUT CONTRAST  Technique:  Multidetector CT  imaging of the head and cervical spine was performed following the standard protocol without intravenous contrast.  Multiplanar CT image reconstructions of the cervical spine were also generated.  Comparison:  07/26/2012  CT HEAD  Findings: Moderate atrophy.  Mild chronic microvascular ischemic change in the white matter.  No acute infarct, hemorrhage, or mass. Negative for skull fracture.  Small scalp hematoma right frontal region.  IMPRESSION: No acute intracranial abnormality.  CT CERVICAL SPINE  Findings: Mild cervical kyphosis.  1 mm anterior slip C4-5. Moderate disc degeneration and spondylosis C3-4, C4-5, C5-6, C6-7. 1 mm anterior slip C7-T1.  Negative for fracture or mass.  Diffuse facet hypertrophy is present.  IMPRESSION: Moderately advanced degenerative changes throughout the cervical spine.    No fracture identified.   Original Report Authenticated By: Janeece Riggers, M.D.   Ct Cervical Spine Wo Contrast  01/31/2013   *RADIOLOGY REPORT*  Clinical Data:  Fall.  Mental status change  CT HEAD WITHOUT CONTRAST CT CERVICAL SPINE WITHOUT CONTRAST  Technique:  Multidetector CT imaging of the head and cervical spine was  performed following the standard protocol without intravenous contrast.  Multiplanar CT image reconstructions of the cervical spine were also generated.  Comparison:  07/26/2012  CT HEAD  Findings: Moderate atrophy.  Mild chronic microvascular ischemic change in the white matter.  No acute infarct, hemorrhage, or mass. Negative for skull fracture.  Small scalp hematoma right frontal region.  IMPRESSION: No acute intracranial abnormality.  CT CERVICAL SPINE  Findings: Mild cervical kyphosis.  1 mm anterior slip C4-5. Moderate disc degeneration and spondylosis C3-4, C4-5, C5-6, C6-7. 1 mm anterior slip C7-T1.  Negative for fracture or mass.  Diffuse facet hypertrophy is present.  IMPRESSION: Moderately advanced degenerative changes throughout the cervical spine.    No fracture identified.    Original Report Authenticated By: Janeece Riggers, M.D.   Mr Hip Right Wo Contrast  01/31/2013   *RADIOLOGY REPORT*  Clinical Data: Status post fall 1 day ago.  Right hip pain.  MRI OF THE RIGHT HIP WITHOUT CONTRAST  Technique:  Multiplanar, multisequence MR imaging was performed. No intravenous contrast was administered.  Comparison: Plain films 01/31/2013.  Findings: The patient has an acute subcapital fracture of the right hip.  Associated marrow edema and small joint effusion are noted. No other fracture is identified.  There is only mild degenerative change about the hips.  Also seen is a T2 hyperintense collection in the deep subcutaneous fat of the left buttock measuring 1.8 cm transverse by 3.2 cm AP by 2.3 cm cranial-caudal compatible with an old hematoma or less likely fat necrosis.  Sacroiliac joints and symphysis pubis are unremarkable. Imaged intrapelvic demonstrated a distended urinary bladder.  IMPRESSION:  1.  Study is positive for an acute subcapital fracture of the right hip. 2.  Likely old hematoma left buttock. 3.  Distend urinary bladder.  Findings were called to Dr. Preston Fleeting at the time of interpretation.   Original Report Authenticated By: Holley Dexter, M.D.    Images viewed by me, discussed with radiologist.   Date: 02/03/2013  Rate: 69  Rhythm: normal sinus rhythm and premature atrial contractions (PAC)  QRS Axis: normal  Intervals: QT prolonged  ST/T Wave abnormalities: normal  Conduction Disutrbances:none  Narrative Interpretation:  Frequent PACs, prolonged QT interval, left ventricular hypertrophy. When compared with ECG of 07/11/2012, QT interval has lengthened.  Old EKG Reviewed: changes noted     1. Fall at home, initial encounter   2. Femoral neck fracture, right, closed, initial encounter       MDM  Fall with possible injury of the right hip. Nausea and vomiting of uncertain cause. She will be sent for CT of the head and cervical spine, plain x-rays of the  right hip.  2:55 PM X-rays show no acute injury. However, patient is noted to be much more somnolent than normal. Attempts were made to ambulate her and she's not able to bear weight on her right leg. She'll be sent for MRI to rule out occult fracture of the right hip. Family is expressing concern about whether they will be ordered take care of her at home. Screening labs and urinalysis will be obtained.  4:21 PM MRI shows subcapital fracture of the right hip. Screening labs have been ordered and she will need to be admitted for surgical management. Case is discussed with Dr. Hilda Lias who agrees to see the patient in consultation. Case is discussed with Dr. Irene Limbo of [facility] who agrees to admit the patient.  Dione Booze, MD 02/03/13 2218

## 2013-01-31 NOTE — Consult Note (Signed)
Reason for Consult:Subcapital fracture of the right hip Referring Physician: ER  [name] is an 79 y.o. female.  HPI: She lives at home with her daughter.  Patient is chronically confused. Source of history is from daughter.  She says her mother often sleep walks.  Her mother fell at home today. She hurt her right hip.  Plain x-rays were negative.  MRI shows a nondisplaced femoral neck fracture.  She has no other acute injury.  She had multiple x-rays and CTs done today.  I have told the daughter and the patient's sister the findings of the hip fracture on the right.  I have recommended cannulated hip pinning on the right under spinal anesthesia.  I have gone over risks and imponderables with them including infection, embolus which could cause death, need for physical therapy, need for possible nursing home placement, possible blood transfusion and also spinal anesthesia.  They asked appropriate questions.  They appear to understand procedure.  They agree to it.  I will do tomorrow morning if OK with hospitalist.  Past Medical History  Diagnosis Date  . Hypertension   . Alzheimer's dementia   . Heart attack   . Parkinson disease   . Cancer     Past Surgical History  Procedure Laterality Date  . Breast surgery    . Abdominal hysterectomy    . Mastectomy    . Small intestine removed    . Tonsillectomy    . Cholecystectomy    . Colon surgery      History reviewed. No pertinent family history.  Social History:  reports that she has never smoked. She does not have any smokeless tobacco history on file. She reports that she does not drink alcohol or use illicit drugs.  Allergies:  Allergies  Allergen Reactions  . Penicillins Swelling and Rash    Medications: I have reviewed the patient's current medications.  No results found for this or any previous visit (from the past 48 hour(s)).  Dg Hip Complete Right  01/31/2013   *RADIOLOGY REPORT*  Clinical Data: Fall with right hip  pain.  RIGHT HIP - COMPLETE 2+ VIEW  Comparison: 02/16/2011  Findings: Surgical changes about the right hemi pelvis.  Mild osteopenia. Femoral heads are located.  Irregularity about the symphysis pubis bilaterally is grossly similar and favored to be degenerative. No acute fracture.  There is degenerative irregularity about the lateral aspect of the right femoral head.  IMPRESSION: Osteopenia and degenerative change. No acute osseous abnormality.   Original Report Authenticated By: Jeronimo Greaves, M.D.   Ct Head Wo Contrast  01/31/2013   *RADIOLOGY REPORT*  Clinical Data:  Fall.  Mental status change  CT HEAD WITHOUT CONTRAST CT CERVICAL SPINE WITHOUT CONTRAST  Technique:  Multidetector CT imaging of the head and cervical spine was performed following the standard protocol without intravenous contrast.  Multiplanar CT image reconstructions of the cervical spine were also generated.  Comparison:  07/26/2012  CT HEAD  Findings: Moderate atrophy.  Mild chronic microvascular ischemic change in the white matter.  No acute infarct, hemorrhage, or mass. Negative for skull fracture.  Small scalp hematoma right frontal region.  IMPRESSION: No acute intracranial abnormality.  CT CERVICAL SPINE  Findings: Mild cervical kyphosis.  1 mm anterior slip C4-5. Moderate disc degeneration and spondylosis C3-4, C4-5, C5-6, C6-7. 1 mm anterior slip C7-T1.  Negative for fracture or mass.  Diffuse facet hypertrophy is present.  IMPRESSION: Moderately advanced degenerative changes throughout the cervical spine.  No fracture identified.   Original Report Authenticated By: Janeece Riggers, M.D.   Ct Cervical Spine Wo Contrast  01/31/2013   *RADIOLOGY REPORT*  Clinical Data:  Fall.  Mental status change  CT HEAD WITHOUT CONTRAST CT CERVICAL SPINE WITHOUT CONTRAST  Technique:  Multidetector CT imaging of the head and cervical spine was performed following the standard protocol without intravenous contrast.  Multiplanar CT image  reconstructions of the cervical spine were also generated.  Comparison:  07/26/2012  CT HEAD  Findings: Moderate atrophy.  Mild chronic microvascular ischemic change in the white matter.  No acute infarct, hemorrhage, or mass. Negative for skull fracture.  Small scalp hematoma right frontal region.  IMPRESSION: No acute intracranial abnormality.  CT CERVICAL SPINE  Findings: Mild cervical kyphosis.  1 mm anterior slip C4-5. Moderate disc degeneration and spondylosis C3-4, C4-5, C5-6, C6-7. 1 mm anterior slip C7-T1.  Negative for fracture or mass.  Diffuse facet hypertrophy is present.  IMPRESSION: Moderately advanced degenerative changes throughout the cervical spine.    No fracture identified.   Original Report Authenticated By: Janeece Riggers, M.D.   Mr Hip Right Wo Contrast  01/31/2013   *RADIOLOGY REPORT*  Clinical Data: Status post fall 1 day ago.  Right hip pain.  MRI OF THE RIGHT HIP WITHOUT CONTRAST  Technique:  Multiplanar, multisequence MR imaging was performed. No intravenous contrast was administered.  Comparison: Plain films 01/31/2013.  Findings: The patient has an acute subcapital fracture of the right hip.  Associated marrow edema and small joint effusion are noted. No other fracture is identified.  There is only mild degenerative change about the hips.  Also seen is a T2 hyperintense collection in the deep subcutaneous fat of the left buttock measuring 1.8 cm transverse by 3.2 cm AP by 2.3 cm cranial-caudal compatible with an old hematoma or less likely fat necrosis.  Sacroiliac joints and symphysis pubis are unremarkable. Imaged intrapelvic demonstrated a distended urinary bladder.  IMPRESSION:  1.  Study is positive for an acute subcapital fracture of the right hip. 2.  Likely old hematoma left buttock. 3.  Distend urinary bladder.  Findings were called to Dr. Preston Fleeting at the time of interpretation.   Original Report Authenticated By: Holley Dexter, M.D.    Review of Systems  Constitutional:  Positive for weight loss and malaise/fatigue.  Musculoskeletal: Positive for falls (She sleep walks.  She fell at home today and hurt her right hip.  ).  Psychiatric/Behavioral:       Dementia  All other systems reviewed and are negative.   Blood pressure 154/103, pulse 77, temperature 98.6 F (37 C), temperature source Oral, resp. rate 18, weight 56.246 kg (124 lb), SpO2 97.00%. Physical Exam  Constitutional: She appears well-developed and well-nourished.  HENT:  Head: Normocephalic and atraumatic.  Eyes: Conjunctivae and EOM are normal. Pupils are equal, round, and reactive to light.  Neck: Normal range of motion. Neck supple.  Cardiovascular: Normal rate, regular rhythm and intact distal pulses.   Respiratory: Effort normal and breath sounds normal.  GI: Soft. Bowel sounds are normal.  Musculoskeletal:       Right hip: She exhibits decreased range of motion, decreased strength and tenderness.       Legs: Neurological:  Disoriented but will answer questions.  Skin: Skin is warm and dry.  Psychiatric:  Disoriented but will answer questions.    Assessment/Plan: Acute nondisplaced right hip femoral neck fracture Dementia  Plan:  Cannulated hip pinning in am.  Caytlyn Evers 01/31/2013, 4:52  PM

## 2013-01-31 NOTE — H&P (Signed)
History and Physical  ROSELL KHOURI AVW:[id number] DOB: 02-22-1920 DOA: 01/31/2013  Referring physician: Dr. Preston Fleeting PCP: Evlyn Courier, MD   Chief Complaint: Hip pain  HPI:  79 year old woman presented to the emergency department with her family noted her to have right hip pain when bearing weight status post presumed fall. Imaging confirmed a right hip fracture, orthopedics was consulted and admission for operative repair was requested.  History obtained from healthcare power of attorney/daughter at bedside. Because of advanced dementia no history can be obtained from the patient. At baseline the patient has advanced dementia and requires total care. Her daughter lives with her. At baseline she displays wandering and aggressive behavior and falls frequently. She falls out of bed nearly every night and frequently falls throughout the day. Her daughter put her to bed last night about 8 PM. This morning she noticed that her mother was not making noise and she went to check on her. She found her on the floor in her room which is not unusual. The patient was awake, alert and at mental baseline. She is never able to get up without assistance. With her daughter helped her get up she noticed that the patient had severe pain in her right hip and could not bear weight and vomited. It is not possible to obtain further information in regard to character, frequency, aggravating or alleviating factors from the patient.  In the emergency department she was noted to be afebrile with stable vital signs. Screening laboratory studies were notable for mild leukocytosis otherwise unremarkable.  The patient received Xanax 3 times a day every day, Tylenol, Xanax and Ambien at night which works well. She takes Haldol 1-2 times per month as needed.  Review of Systems:  Negative for fever, visual changes, sore throat, rash, chest pain, SOB, dysuria, bleeding, n/v/abdominal pain.  Past Medical History  Diagnosis Date  .  Hypertension   . Alzheimer's dementia   . Heart attack   . Parkinson disease   . Cancer     breast    Past Surgical History  Procedure Laterality Date  . Breast surgery    . Abdominal hysterectomy    . Mastectomy    . Small intestine removed    . Tonsillectomy    . Cholecystectomy    . Colon surgery    . Pelvic fracture surgery    . Leg surgery    . Arm surgery      Social History:  reports that she has never smoked. She does not have any smokeless tobacco history on file. She reports that she does not drink alcohol or use illicit drugs.  Allergies  Allergen Reactions  . Penicillins Swelling and Rash    Family History  Problem Relation Age of Onset  . Aneurysm Mother   . Diabetes Father      Prior to Admission medications   Medication Sig Start Date End Date Taking? Authorizing Provider  acetaminophen (TYLENOL) 500 MG tablet Take 500 mg by mouth at bedtime. For pain   Yes Historical Provider, MD  ALPRAZolam Prudy Feeler) 0.5 MG tablet Take 0.5 mg by mouth 3 (three) times daily as needed. For anxiety   Yes Historical Provider, MD  aspirin EC 81 MG tablet Take 81 mg by mouth every morning.    Yes Historical Provider, MD  benztropine (COGENTIN) 1 MG tablet Take 1 mg by mouth daily. At 1400 (2pm)   Yes Historical Provider, MD  bumetanide (BUMEX) 0.5 MG tablet Take 0.5 mg by mouth  daily.   Yes Historical Provider, MD  citalopram (CELEXA) 20 MG tablet Take 20 mg by mouth every morning.    Yes Historical Provider, MD  diclofenac sodium (VOLTAREN) 1 % GEL Apply topically as needed. For arm pain   Yes Historical Provider, MD  haloperidol (HALDOL) 2 MG/ML solution Take 6 mg by mouth 2 (two) times daily.   Yes Historical Provider, MD  metoprolol (LOPRESSOR) 50 MG tablet Take 25 mg by mouth every morning.    Yes Historical Provider, MD  rivastigmine (EXELON) 9.5 mg/24hr Place 1 patch onto the skin daily. *Remove and discard used patches*   Yes Historical Provider, MD  zolpidem (AMBIEN)  10 MG tablet Take 10 mg by mouth at bedtime.   Yes Historical Provider, MD   Physical Exam: Filed Vitals:   01/31/13 1203  BP: 154/103  Pulse: 77  Temp: 98.6 F (37 C)  TempSrc: Oral  Resp: 18  Weight: 56.246 kg (124 lb)  SpO2: 97%    General: Examined in the emergency department. Appears calm and comfortable. Resting tremor noted especially in the upper extremities and face. Eyes: PERRL, normal lids, irises  ENT: grossly normal hearing, lips & tongue Neck: no LAD, masses or thyromegaly Cardiovascular: RRR, no m/r/g. No LE edema. Respiratory: CTA bilaterally, no w/r/r. Normal respiratory effort. Abdomen: soft, ntnd Skin: no rash or induration seen  Musculoskeletal: grossly normal tone BUE/BLE. Moves upper and lower extremities to command. Psychiatric: grossly normal mood and affect Neurologic: grossly non-focal.  Wt Readings from Last 3 Encounters:  01/31/13 56.246 kg (124 lb)  07/11/12 55.1 kg (121 lb 7.6 oz)  07/01/12 55.475 kg (122 lb 4.8 oz)    Labs on Admission:  Basic Metabolic Panel:  Recent Labs Lab 01/31/13 1630  NA 137  K 4.4  CL 100  CO2 28  GLUCOSE 120*  BUN 13  CREATININE 0.71  CALCIUM 9.4    Liver Function Tests:  Recent Labs Lab 01/31/13 1630  AST 23  ALT 25  ALKPHOS 97  BILITOT 0.8  PROT 7.8  ALBUMIN 3.3*   CBC:  Recent Labs Lab 01/31/13 1630  WBC 15.3*  NEUTROABS 13.2*  HGB 10.9*  HCT 34.1*  MCV 69.9*  PLT 323    Radiological Exams on Admission: Dg Hip Complete Right  01/31/2013   *RADIOLOGY REPORT*  Clinical Data: Fall with right hip pain.  RIGHT HIP - COMPLETE 2+ VIEW  Comparison: 02/16/2011  Findings: Surgical changes about the right hemi pelvis.  Mild osteopenia. Femoral heads are located.  Irregularity about the symphysis pubis bilaterally is grossly similar and favored to be degenerative. No acute fracture.  There is degenerative irregularity about the lateral aspect of the right femoral head.  IMPRESSION: Osteopenia  and degenerative change. No acute osseous abnormality.   Original Report Authenticated By: Jeronimo Greaves, M.D.   Ct Head Wo Contrast  01/31/2013   *RADIOLOGY REPORT*  Clinical Data:  Fall.  Mental status change  CT HEAD WITHOUT CONTRAST CT CERVICAL SPINE WITHOUT CONTRAST  Technique:  Multidetector CT imaging of the head and cervical spine was performed following the standard protocol without intravenous contrast.  Multiplanar CT image reconstructions of the cervical spine were also generated.  Comparison:  07/26/2012  CT HEAD  Findings: Moderate atrophy.  Mild chronic microvascular ischemic change in the white matter.  No acute infarct, hemorrhage, or mass. Negative for skull fracture.  Small scalp hematoma right frontal region.  IMPRESSION: No acute intracranial abnormality.  CT CERVICAL SPINE  Findings:  Mild cervical kyphosis.  1 mm anterior slip C4-5. Moderate disc degeneration and spondylosis C3-4, C4-5, C5-6, C6-7. 1 mm anterior slip C7-T1.  Negative for fracture or mass.  Diffuse facet hypertrophy is present.  IMPRESSION: Moderately advanced degenerative changes throughout the cervical spine.    No fracture identified.   Original Report Authenticated By: Janeece Riggers, M.D.   Ct Cervical Spine Wo Contrast  01/31/2013   *RADIOLOGY REPORT*  Clinical Data:  Fall.  Mental status change  CT HEAD WITHOUT CONTRAST CT CERVICAL SPINE WITHOUT CONTRAST  Technique:  Multidetector CT imaging of the head and cervical spine was performed following the standard protocol without intravenous contrast.  Multiplanar CT image reconstructions of the cervical spine were also generated.  Comparison:  07/26/2012  CT HEAD  Findings: Moderate atrophy.  Mild chronic microvascular ischemic change in the white matter.  No acute infarct, hemorrhage, or mass. Negative for skull fracture.  Small scalp hematoma right frontal region.  IMPRESSION: No acute intracranial abnormality.  CT CERVICAL SPINE  Findings: Mild cervical kyphosis.  1 mm  anterior slip C4-5. Moderate disc degeneration and spondylosis C3-4, C4-5, C5-6, C6-7. 1 mm anterior slip C7-T1.  Negative for fracture or mass.  Diffuse facet hypertrophy is present.  IMPRESSION: Moderately advanced degenerative changes throughout the cervical spine.    No fracture identified.   Original Report Authenticated By: Janeece Riggers, M.D.   Mr Hip Right Wo Contrast  01/31/2013   *RADIOLOGY REPORT*  Clinical Data: Status post fall 1 day ago.  Right hip pain.  MRI OF THE RIGHT HIP WITHOUT CONTRAST  Technique:  Multiplanar, multisequence MR imaging was performed. No intravenous contrast was administered.  Comparison: Plain films 01/31/2013.  Findings: The patient has an acute subcapital fracture of the right hip.  Associated marrow edema and small joint effusion are noted. No other fracture is identified.  There is only mild degenerative change about the hips.  Also seen is a T2 hyperintense collection in the deep subcutaneous fat of the left buttock measuring 1.8 cm transverse by 3.2 cm AP by 2.3 cm cranial-caudal compatible with an old hematoma or less likely fat necrosis.  Sacroiliac joints and symphysis pubis are unremarkable. Imaged intrapelvic demonstrated a distended urinary bladder.  IMPRESSION:  1.  Study is positive for an acute subcapital fracture of the right hip. 2.  Likely old hematoma left buttock. 3.  Distend urinary bladder.  Findings were called to Dr. Preston Fleeting at the time of interpretation.   Original Report Authenticated By: Holley Dexter, M.D.    EKG: Independently reviewed. SR, prolonged QT.   Principal Problem:   Closed right hip fracture Active Problems:   Fall   Dementia with behavioral disturbance   Parkinson disease   Assessment/Plan 1. Right hip fracture: Most likely mechanical fall. Operative management per orthopedics. Family understands risk given her advanced age and comorbidities and elects to proceed. Per family history of MI was 43 years ago with no  subsequent cardiac problems. No history of chest pain or shortness of breath. No further evaluation suggested. Clear for surgery. 2. Recurrent falls: Secondary to dementia and wandering behavior as well as Parkinson's. 3. Advanced Alzheimer's dementia with chronic behavioral disturbances: Aggressive per daughter, along her. Fall precautions. Continue Xanax. Avoid Haldol given prolonged QT. 4. Parkinson's disease: Appears to be at baseline.  Code Status: DNR/DNI Family Communication: as above Disposition Plan/Anticipated LOS: admit. 2-3 days  Time spent: 65 minutes  Brendia Sacks, MD  [facility] Pager [phone] 01/31/2013, 6:19 PM

## 2013-01-31 NOTE — ED Notes (Signed)
Fall some time during the night, found in floor, care givers think she may have hurt her lt hip because she "vomits when we move it"

## 2013-01-31 NOTE — ED Notes (Signed)
EDP made aware of pt's inability to ambulate, family concerned of pt's gaze/staring off

## 2013-01-31 NOTE — ED Notes (Signed)
MD at bedside. 

## 2013-01-31 NOTE — Anesthesia Preprocedure Evaluation (Addendum)
Anesthesia Evaluation  Patient identified by MRN, date of birth, ID band Patient awake    Reviewed: Allergy & Precautions, H&P , NPO status , Patient's Chart, lab work & pertinent test results, reviewed documented beta blocker date and time   Airway Mallampati: II TM Distance: >3 FB Neck ROM: Full    Dental  (+) Teeth Intact and Dental Advisory Given   Pulmonary pneumonia -, resolved,  breath sounds clear to auscultation        Cardiovascular hypertension, Pt. on home beta blockers + Past MI Rhythm:Regular     Neuro/Psych PSYCHIATRIC DISORDERS    GI/Hepatic   Endo/Other    Renal/GU      Musculoskeletal   Abdominal   Peds  Hematology   Anesthesia Other Findings   Reproductive/Obstetrics                          Anesthesia Physical Anesthesia Plan  ASA: III and emergent  Anesthesia Plan: Spinal   Post-op Pain Management:    Induction:   Airway Management Planned: Simple Face Mask  Additional Equipment:   Intra-op Plan:   Post-operative Plan:   Informed Consent: I have reviewed the patients History and Physical, chart, labs and discussed the procedure including the risks, benefits and alternatives for the proposed anesthesia with the patient or authorized representative who has indicated his/her understanding and acceptance.   Dental advisory given  Plan Discussed with: Anesthesiologist  Anesthesia Plan Comments: (Telephone consult with Dr. Jayme Cloud)        Anesthesia Quick Evaluation

## 2013-02-01 ENCOUNTER — Encounter (HOSPITAL_COMMUNITY): Payer: Self-pay | Admitting: Anesthesiology

## 2013-02-01 ENCOUNTER — Encounter (HOSPITAL_COMMUNITY)
Admission: EM | Disposition: A | Discharge status: Skilled Nursing Facility | Payer: Self-pay | Source: Home / Self Care | Attending: Family Medicine

## 2013-02-01 ENCOUNTER — Inpatient Hospital Stay (HOSPITAL_COMMUNITY): Payer: PRIVATE HEALTH INSURANCE

## 2013-02-01 ENCOUNTER — Inpatient Hospital Stay (HOSPITAL_COMMUNITY): Payer: PRIVATE HEALTH INSURANCE | Admitting: Anesthesiology

## 2013-02-01 HISTORY — PX: HIP PINNING,CANNULATED: SHX1758

## 2013-02-01 SURGERY — FIXATION, FEMUR, NECK, PERCUTANEOUS, USING SCREW
Anesthesia: Spinal | Site: Hip | Laterality: Right | Wound class: Clean

## 2013-02-01 MED ORDER — METOPROLOL TARTRATE 1 MG/ML IV SOLN
INTRAVENOUS | Status: AC
  Filled 2013-02-01: qty 5

## 2013-02-01 MED ORDER — ALBUTEROL SULFATE (5 MG/ML) 0.5% IN NEBU
2.5000 mg | INHALATION_SOLUTION | Freq: Four times a day (QID) | RESPIRATORY_TRACT | Status: DC
  Administered 2013-02-01 – 2013-02-04 (×10): 2.5 mg via RESPIRATORY_TRACT
  Filled 2013-02-01 (×13): qty 0.5

## 2013-02-01 MED ORDER — EPHEDRINE SULFATE 50 MG/ML IJ SOLN
INTRAMUSCULAR | Status: DC | PRN
  Administered 2013-02-01 (×4): 5 mg via INTRAVENOUS
  Administered 2013-02-01: 10 mg via INTRAVENOUS
  Administered 2013-02-01 (×3): 5 mg via INTRAVENOUS

## 2013-02-01 MED ORDER — METOPROLOL TARTRATE 1 MG/ML IV SOLN
INTRAVENOUS | Status: DC | PRN
  Administered 2013-02-01: .5 mg via INTRAVENOUS

## 2013-02-01 MED ORDER — ACETAMINOPHEN 10 MG/ML IV SOLN
1000.0000 mg | Freq: Four times a day (QID) | INTRAVENOUS | Status: AC
  Administered 2013-02-01 – 2013-02-02 (×4): 1000 mg via INTRAVENOUS
  Filled 2013-02-01 (×4): qty 100

## 2013-02-01 MED ORDER — LIDOCAINE HCL (CARDIAC) 10 MG/ML IV SOLN
INTRAVENOUS | Status: DC | PRN
  Administered 2013-02-01: 10 mg via INTRAVENOUS

## 2013-02-01 MED ORDER — ACETAMINOPHEN 10 MG/ML IV SOLN
INTRAVENOUS | Status: AC
  Filled 2013-02-01: qty 300

## 2013-02-01 MED ORDER — FENTANYL CITRATE 0.05 MG/ML IJ SOLN
INTRAMUSCULAR | Status: AC
  Filled 2013-02-01: qty 2

## 2013-02-01 MED ORDER — BUPIVACAINE IN DEXTROSE 0.75-8.25 % IT SOLN
INTRATHECAL | Status: DC | PRN
  Administered 2013-02-01: 11.25 mg via INTRATHECAL

## 2013-02-01 MED ORDER — MIDAZOLAM HCL 5 MG/5ML IJ SOLN
INTRAMUSCULAR | Status: DC | PRN
  Administered 2013-02-01: 0.5 mg via INTRAVENOUS

## 2013-02-01 MED ORDER — PROPOFOL 10 MG/ML IV EMUL
INTRAVENOUS | Status: AC
  Filled 2013-02-01: qty 20

## 2013-02-01 MED ORDER — BUPIVACAINE IN DEXTROSE 0.75-8.25 % IT SOLN
INTRATHECAL | Status: AC
Start: 2013-02-01 — End: 2013-02-01
  Filled 2013-02-01: qty 2

## 2013-02-01 MED ORDER — LIDOCAINE HCL (PF) 1 % IJ SOLN
INTRAMUSCULAR | Status: AC
  Filled 2013-02-01: qty 5

## 2013-02-01 MED ORDER — EPHEDRINE SULFATE 50 MG/ML IJ SOLN
INTRAMUSCULAR | Status: AC
  Filled 2013-02-01: qty 1

## 2013-02-01 MED ORDER — ENOXAPARIN SODIUM 40 MG/0.4ML ~~LOC~~ SOLN
40.0000 mg | SUBCUTANEOUS | Status: DC
  Administered 2013-02-02 – 2013-02-04 (×3): 40 mg via SUBCUTANEOUS
  Filled 2013-02-01 (×3): qty 0.4

## 2013-02-01 MED ORDER — FENTANYL CITRATE 0.05 MG/ML IJ SOLN
INTRAMUSCULAR | Status: DC | PRN
  Administered 2013-02-01: 25 ug via INTRAVENOUS

## 2013-02-01 MED ORDER — PROPOFOL INFUSION 10 MG/ML OPTIME
INTRAVENOUS | Status: DC | PRN
  Administered 2013-02-01: 50 ug/kg/min via INTRAVENOUS

## 2013-02-01 MED ORDER — FENTANYL CITRATE 0.05 MG/ML IJ SOLN
INTRAMUSCULAR | Status: DC | PRN
  Administered 2013-02-01: 12.5 ug via INTRATHECAL

## 2013-02-01 MED ORDER — SODIUM CHLORIDE 0.9 % IR SOLN
Status: DC | PRN
  Administered 2013-02-01: 1000 mL

## 2013-02-01 MED ORDER — MIDAZOLAM HCL 2 MG/2ML IJ SOLN
INTRAMUSCULAR | Status: AC
  Filled 2013-02-01: qty 2

## 2013-02-01 MED ORDER — SODIUM CHLORIDE 0.9 % IV SOLN
INTRAVENOUS | Status: DC | PRN
  Administered 2013-02-01: 09:00:00 via INTRAVENOUS

## 2013-02-01 SURGICAL SUPPLY — 45 items
BAG HAMPER (MISCELLANEOUS) ×2 IMPLANT
BLADE SURG SZ10 CARB STEEL (BLADE) ×2 IMPLANT
CLEANER TIP ELECTROSURG 2X2 (MISCELLANEOUS) ×2 IMPLANT
CLOTH BEACON ORANGE TIMEOUT ST (SAFETY) ×2 IMPLANT
COVER LIGHT HANDLE STERIS (MISCELLANEOUS) ×4 IMPLANT
COVER MAYO STAND XLG (DRAPE) ×2 IMPLANT
DRAPE STERI IOBAN 125X83 (DRAPES) ×2 IMPLANT
EVACUATOR 3/16  PVC DRAIN (DRAIN) ×1
EVACUATOR 3/16 PVC DRAIN (DRAIN) ×1 IMPLANT
GAUZE KERLIX 2  STERILE LF (GAUZE/BANDAGES/DRESSINGS) IMPLANT
GAUZE XEROFORM 5X9 LF (GAUZE/BANDAGES/DRESSINGS) ×2 IMPLANT
GLOVE BIO SURGEON STRL SZ8 (GLOVE) ×2 IMPLANT
GLOVE BIO SURGEON STRL SZ8.5 (GLOVE) ×2 IMPLANT
GLOVE BIOGEL PI IND STRL 7.0 (GLOVE) ×2 IMPLANT
GLOVE BIOGEL PI IND STRL 7.5 (GLOVE) ×1 IMPLANT
GLOVE BIOGEL PI INDICATOR 7.0 (GLOVE) ×2
GLOVE BIOGEL PI INDICATOR 7.5 (GLOVE) ×1
GLOVE SS BIOGEL STRL SZ 6.5 (GLOVE) ×1 IMPLANT
GLOVE SUPERSENSE BIOGEL SZ 6.5 (GLOVE) ×1
GOWN STRL REIN XL XLG (GOWN DISPOSABLE) ×6 IMPLANT
GUIDEWIRE CALB ASNIS (WIRE) ×6 IMPLANT
INST SET MAJOR BONE (KITS) ×2 IMPLANT
KIT BLADEGUARD II DBL (SET/KITS/TRAYS/PACK) ×2 IMPLANT
KIT ROOM TURNOVER APOR (KITS) ×2 IMPLANT
MANIFOLD NEPTUNE II (INSTRUMENTS) ×2 IMPLANT
MARKER SKIN DUAL TIP RULER LAB (MISCELLANEOUS) ×2 IMPLANT
NS IRRIG 1000ML POUR BTL (IV SOLUTION) ×2 IMPLANT
PACK BASIC III (CUSTOM PROCEDURE TRAY) ×1
PACK SRG BSC III STRL LF ECLPS (CUSTOM PROCEDURE TRAY) ×1 IMPLANT
PAD ABD 5X9 TENDERSORB (GAUZE/BANDAGES/DRESSINGS) ×2 IMPLANT
PENCIL HANDSWITCHING (ELECTRODE) ×2 IMPLANT
SCREW ASNIS 100MM (Screw) ×2 IMPLANT
SCREW ASNIS 85MM (Screw) ×2 IMPLANT
SCREW ASNIS 90MM (Screw) ×2 IMPLANT
SET BASIN LINEN APH (SET/KITS/TRAYS/PACK) ×2 IMPLANT
SPONGE DRAIN TRACH 4X4 STRL 2S (GAUZE/BANDAGES/DRESSINGS) ×2 IMPLANT
SPONGE GAUZE 4X4 12PLY (GAUZE/BANDAGES/DRESSINGS) ×2 IMPLANT
SPONGE LAP 18X18 X RAY DECT (DISPOSABLE) ×4 IMPLANT
STAPLER VISISTAT 35W (STAPLE) ×2 IMPLANT
SUT BRALON NAB BRD #1 30IN (SUTURE) ×4 IMPLANT
SUT PLAIN 2 0 XLH (SUTURE) ×4 IMPLANT
SUT SILK 0 FSL (SUTURE) ×2 IMPLANT
SYR BULB IRRIGATION 50ML (SYRINGE) ×2 IMPLANT
TAPE MEDIFIX FOAM 3 (GAUZE/BANDAGES/DRESSINGS) ×2 IMPLANT
YANKAUER SUCT 12FT TUBE ARGYLE (SUCTIONS) ×2 IMPLANT

## 2013-02-01 NOTE — Progress Notes (Signed)
[facility] PROGRESS NOTE  [name] GNF:[id number] DOB: [birth year] DOA: 01/31/2013 PCP: Evlyn Courier, MD  Assessment/Plan: 1. Right hip fracture: Presumed mechanical fall. Status post surgery 5/17. Management per orthopedics. 2. Recurrent falls: Secondary to dementia, Parkinson's and wandering behavior. 3. Advanced Alzheimer's dementia with chronic behavioral disturbances: Aggressive at home per her daughter. Avoid Haldol given prolonged QT. Continue Xanax. 4. Parkinson's disease: Appears to be at baseline. 5. Fever: None since last night. Urinalysis negative. Maybe secondary to fracture. Followup urine culture. Monitor.  Code Status: DO NOT RESUSCITATE DVT prophylaxis: Lovenox Family Communication: Discussed with family at bedside Disposition Plan: Pending physical therapy evaluation  Brendia Sacks, MD  [facility]  Pager [phone] If 7PM-7AM, please contact night-coverage at [email or web address], password [facility] 02/01/2013, 2:31 PM  LOS: 1 day   Brief narrative: 79 year old woman presented to the emergency department with her family noted her to have right hip pain when bearing weight status post presumed fall. Imaging confirmed a right hip fracture, orthopedics was consulted and admission for operative repair was requested.  Consultants:  Orthopedics  Procedures:  5/17 Cannulated hip pinning, right  HPI/Subjective: Doing well per family. Mentally at baseline per family. Fever 101.4 at 1047 PM last night. Afebrile since. Does not seem to have any complaints.  Objective: Filed Vitals:   02/01/13 1140 02/01/13 1200 02/01/13 1216 02/01/13 1315  BP: 114/73  129/78 114/69  Pulse:      Temp: 97.6 F (36.4 C)  97.7 F (36.5 C) 97.7 F (36.5 C)  TempSrc: Oral     Resp:  16 16 18   Height:      Weight:      SpO2: 100% 100%  100%    Intake/Output Summary (Last 24 hours) at 02/01/13 1431 Last data filed at 02/01/13 1042  Gross per 24 hour  Intake    700 ml   Output   1050 ml  Net   -350 ml   Filed Weights   01/31/13 1203 01/31/13 1941  Weight: 56.246 kg (124 lb) 57.4 kg (126 lb 8.7 oz)    Exam:  General:  Appears calm and comfortable postoperatively. Whole body resting tremor noted. Baseline. Cardiovascular: RRR, no m/r/g. 1+ LE edema. Respiratory: CTA bilaterally, no w/r/r. Normal respiratory effort. Abdomen: soft, ntnd Psychiatric: grossly unchanged mood and affect  Data Reviewed: Basic Metabolic Panel:  Recent Labs Lab 01/31/13 1630  NA 137  K 4.4  CL 100  CO2 28  GLUCOSE 120*  BUN 13  CREATININE 0.71  CALCIUM 9.4   Liver Function Tests:  Recent Labs Lab 01/31/13 1630  AST 23  ALT 25  ALKPHOS 97  BILITOT 0.8  PROT 7.8  ALBUMIN 3.3*   CBC:  Recent Labs Lab 01/31/13 1630  WBC 15.3*  NEUTROABS 13.2*  HGB 10.9*  HCT 34.1*  MCV 69.9*  PLT 323     Recent Results (from the past 240 hour(s))  SURGICAL PCR SCREEN     Status: Abnormal   Collection Time    01/31/13  9:20 PM      Result Value Range Status   MRSA, PCR NEGATIVE  NEGATIVE Final   Staphylococcus aureus POSITIVE (*) NEGATIVE Final   Comment:            The Xpert SA Assay (FDA     approved for NASAL specimens     in patients over 67 years of age),     is one component of     a comprehensive surveillance  program.  Test performance has     been validated by [facility] for patients greater     than or equal to 68 year old.     It is not intended     to diagnose infection nor to     guide or monitor treatment.     RESULT CALLED TO, READ BACK BY AND VERIFIED WITH:     M. SIMPSON AT 2314 ON 01/31/13 BY Wynonia Lawman     Studies: Dg Hip Complete Right  01/31/2013   *RADIOLOGY REPORT*  Clinical Data: Fall with right hip pain.  RIGHT HIP - COMPLETE 2+ VIEW  Comparison: 02/16/2011  Findings: Surgical changes about the right hemi pelvis.  Mild osteopenia. Femoral heads are located.  Irregularity about the symphysis pubis bilaterally is  grossly similar and favored to be degenerative. No acute fracture.  There is degenerative irregularity about the lateral aspect of the right femoral head.  IMPRESSION: Osteopenia and degenerative change. No acute osseous abnormality.   Original Report Authenticated By: Jeronimo Greaves, M.D.   Dg Hip Operative Right  02/01/2013   *RADIOLOGY REPORT*  Clinical Data: Hip pain.  DG OPERATIVE RIGHT HIP  Comparison: MRI and plain films earlier in the day.  Findings: Placement of screw fixation device across the previously described femoral fracture. No acute hardware complication.  IMPRESSION: Internal fixation of subcapital right femoral head fracture.   Original Report Authenticated By: Jeronimo Greaves, M.D.   Ct Head Wo Contrast  01/31/2013   *RADIOLOGY REPORT*  Clinical Data:  Fall.  Mental status change  CT HEAD WITHOUT CONTRAST CT CERVICAL SPINE WITHOUT CONTRAST  Technique:  Multidetector CT imaging of the head and cervical spine was performed following the standard protocol without intravenous contrast.  Multiplanar CT image reconstructions of the cervical spine were also generated.  Comparison:  07/26/2012  CT HEAD  Findings: Moderate atrophy.  Mild chronic microvascular ischemic change in the white matter.  No acute infarct, hemorrhage, or mass. Negative for skull fracture.  Small scalp hematoma right frontal region.  IMPRESSION: No acute intracranial abnormality.  CT CERVICAL SPINE  Findings: Mild cervical kyphosis.  1 mm anterior slip C4-5. Moderate disc degeneration and spondylosis C3-4, C4-5, C5-6, C6-7. 1 mm anterior slip C7-T1.  Negative for fracture or mass.  Diffuse facet hypertrophy is present.  IMPRESSION: Moderately advanced degenerative changes throughout the cervical spine.    No fracture identified.   Original Report Authenticated By: Janeece Riggers, M.D.   Ct Cervical Spine Wo Contrast  01/31/2013   *RADIOLOGY REPORT*  Clinical Data:  Fall.  Mental status change  CT HEAD WITHOUT CONTRAST CT CERVICAL  SPINE WITHOUT CONTRAST  Technique:  Multidetector CT imaging of the head and cervical spine was performed following the standard protocol without intravenous contrast.  Multiplanar CT image reconstructions of the cervical spine were also generated.  Comparison:  07/26/2012  CT HEAD  Findings: Moderate atrophy.  Mild chronic microvascular ischemic change in the white matter.  No acute infarct, hemorrhage, or mass. Negative for skull fracture.  Small scalp hematoma right frontal region.  IMPRESSION: No acute intracranial abnormality.  CT CERVICAL SPINE  Findings: Mild cervical kyphosis.  1 mm anterior slip C4-5. Moderate disc degeneration and spondylosis C3-4, C4-5, C5-6, C6-7. 1 mm anterior slip C7-T1.  Negative for fracture or mass.  Diffuse facet hypertrophy is present.  IMPRESSION: Moderately advanced degenerative changes throughout the cervical spine.    No fracture identified.   Original Report Authenticated  By: Janeece Riggers, M.D.   Mr Hip Right Wo Contrast  01/31/2013   *RADIOLOGY REPORT*  Clinical Data: Status post fall 1 day ago.  Right hip pain.  MRI OF THE RIGHT HIP WITHOUT CONTRAST  Technique:  Multiplanar, multisequence MR imaging was performed. No intravenous contrast was administered.  Comparison: Plain films 01/31/2013.  Findings: The patient has an acute subcapital fracture of the right hip.  Associated marrow edema and small joint effusion are noted. No other fracture is identified.  There is only mild degenerative change about the hips.  Also seen is a T2 hyperintense collection in the deep subcutaneous fat of the left buttock measuring 1.8 cm transverse by 3.2 cm AP by 2.3 cm cranial-caudal compatible with an old hematoma or less likely fat necrosis.  Sacroiliac joints and symphysis pubis are unremarkable. Imaged intrapelvic demonstrated a distended urinary bladder.  IMPRESSION:  1.  Study is positive for an acute subcapital fracture of the right hip. 2.  Likely old hematoma left buttock. 3.   Distend urinary bladder.  Findings were called to Dr. Preston Fleeting at the time of interpretation.   Original Report Authenticated By: Holley Dexter, M.D.    Scheduled Meds: . acetaminophen  1,000 mg Intravenous Q6H  . acetaminophen  500 mg Oral QHS  . albuterol  2.5 mg Nebulization Q6H  . ALPRAZolam  0.5 mg Oral TID  . aspirin EC  81 mg Oral q morning - 10a  . benztropine  1 mg Oral Q1400  . bumetanide  0.5 mg Oral Daily  . Chlorhexidine Gluconate Cloth  6 each Topical Daily  . citalopram  20 mg Oral q morning - 10a  . [START ON 02/02/2013] enoxaparin (LOVENOX) injection  40 mg Subcutaneous Q24H  . metoprolol  25 mg Oral q morning - 10a  . mupirocin ointment  1 application Nasal BID  . rivastigmine  9.5 mg Transdermal Daily  . zolpidem  5 mg Oral QHS   Continuous Infusions:   Principal Problem:   Closed right hip fracture Active Problems:   Fall   Dementia with behavioral disturbance   Parkinson disease     Brendia Sacks, MD  [facility] Pager [phone] If 7PM-7AM, please contact night-coverage at [email or web address], password [facility] 02/01/2013, 2:31 PM  LOS: 1 day   Time spent: 20 minutes

## 2013-02-01 NOTE — ED Provider Notes (Signed)
Care assumed at shift change pending labs prior to hospitalist admission. Labs reviewed, Dr Irene Limbo notified.   Fremon Zacharia B. Bernette Mayers, MD 02/01/13 0900

## 2013-02-01 NOTE — Brief Op Note (Signed)
01/31/2013 - 02/01/2013  10:36 AM  PATIENT:  [name]  78 y.o. female  PRE-OPERATIVE DIAGNOSIS:  Right Femoral Neck Fracture   POST-OPERATIVE DIAGNOSIS:  Right Femoral Neck Fracture  PROCEDURE:  Procedure(s): CANNULATED HIP PINNING (Right)  SURGEON:  Surgeon(s) and Role:    * Darreld Mclean, MD - Primary  PHYSICIAN ASSISTANT:   ASSISTANTS: none   ANESTHESIA:   spinal  EBL:  Total I/O In: 600 [I.V.:600] Out: 150 [Urine:100; Blood:50]  BLOOD ADMINISTERED:none  DRAINS: (Large) Hemovact drain(s) in the right hip area with  Suction Open   LOCAL MEDICATIONS USED:  NONE  SPECIMEN:  No Specimen  DISPOSITION OF SPECIMEN:  N/A  COUNTS:  YES  TOURNIQUET:  * No tourniquets in log *  DICTATION: .Other Dictation: Dictation Number [id number]  PLAN OF CARE: Admit to inpatient   PATIENT DISPOSITION:  PACU - hemodynamically stable.   Delay start of Pharmacological VTE agent (>24hrs) due to surgical blood loss or risk of bleeding: no

## 2013-02-01 NOTE — Transfer of Care (Signed)
Immediate Anesthesia Transfer of Care Note  Patient: [name]  Procedure(s) Performed: Procedure(s) (LRB): CANNULATED HIP PINNING (Right)  Patient Location: PACU  Anesthesia Type: SAB  Level of Consciousness: awake  Airway & Oxygen Therapy: Patient Spontanous Breathing and nasal cannula  Post-op Assessment: Report given to PACU RN, Post -op Vital signs reviewed and stable. SAB Level  T 10  Post vital signs: Reviewed and stable  Complications: No apparent anesthesia complications

## 2013-02-01 NOTE — Anesthesia Postprocedure Evaluation (Signed)
Anesthesia Post Note  Patient: [name]  Procedure(s) Performed: Procedure(s) (LRB): CANNULATED HIP PINNING (Right)  Anesthesia type: Spinal  Patient location: PACU  Post pain: Pain level controlled  Post assessment: Post-op Vital signs reviewed, Patient's Cardiovascular Status Stable, Respiratory Function Stable, Patent Airway, No signs of Nausea or vomiting and Pain level controlled  Last Vitals:  Filed Vitals:   02/01/13 1056  BP: 112/48  Pulse: 73  Temp: 36.7 C  Resp: 20    Post vital signs: Reviewed and stable  Level of consciousness: awake and alert   Complications: No apparent anesthesia complications

## 2013-02-01 NOTE — Anesthesia Procedure Notes (Addendum)
Spinal  Patient location during procedure: OR Start time: 02/01/2013 9:27 AM End time: 02/01/2013 9:47 AM Staffing CRNA/Resident: Minerva Areola S Preanesthetic Checklist Completed: patient identified, site marked, surgical consent, pre-op evaluation, timeout performed, IV checked, risks and benefits discussed and monitors and equipment checked Spinal Block Patient position: right lateral decubitus Prep: Betadine Patient monitoring: heart rate, cardiac monitor, continuous pulse ox and blood pressure Approach: right paramedian Location: L4-5 Injection technique: single-shot Needle Needle type: Spinocan  Needle gauge: 22 G Needle length: 9 cm Assessment Sensory level: T8 Additional Notes Betadine prep x 3 1% lidocaine skin wheal 1 cc Clear CSF pre and post injection   ATTEMPTS: 4 TRAY ID: [id number] TRAY EXPIRATION DATE: 2014-12   Attempted  L 3-4 NO success  Procedure Name: MAC Date/Time: 02/01/2013 9:12 AM Performed by: Franco Nones Pre-anesthesia Checklist: Patient identified, Emergency Drugs available, Suction available, Timeout performed and Patient being monitored Patient Re-evaluated:Patient Re-evaluated prior to inductionOxygen Delivery Method: Nasal Cannula

## 2013-02-01 NOTE — Op Note (Signed)
[name], [name]                  ACCOUNT NO.:  [id number]  MEDICAL RECORD NO.:  [id number]  LOCATION:  A225                          FACILITY:  [facility]  PHYSICIAN:  J. Darreld Mclean, M.D. DATE OF BIRTH:  1919/11/09  DATE OF PROCEDURE: DATE OF DISCHARGE:                              OPERATIVE REPORT   PREOPERATIVE DIAGNOSIS:  Femoral neck fracture, right hip.  POSTOPERATIVE DIAGNOSIS:  Femoral neck fracture, right hip.  PROCEDURE:  Asnis cannulated pinning of the right hip using 100 mm screw, 90 mm screw, and 85 mm screw well on parallel.  ANESTHESIA:  Spinal.  SURGEON:  J. Darreld Mclean, M.D.  ASSISTANT:  None.  DRAINS:  One large Hemovac drain.  INDICATIONS:  The patient fell yesterday at home, injured her hip. Plain x-rays were negative, but she complained of significant pain.  The ER physician got an MRI of her hip and it showed a femoral neck fracture, not visualized on the plain film.  She had pain with motion on the hip.  The patient has been admitted to the hospitalist service.  She is a 79 year old and confused.  She has been cleared for surgery.  Risks and imponderables of procedure were discussed with the family.  The patient is allergic to penicillin.  Therefore, she was given vancomycin preoperatively in the holding area.  DESCRIPTION OF PROCEDURE:  The patient was seen in the holding area. The right hip was identified as correct surgical site by the family.  I placed a mark on the right hip area.  She was brought back to the operating room, given spinal anesthesia.  The patient is placed in a fracture table.  C-arm fluoroscopy unit was brought into position. Everyone had on lead apron slip, thyroid Dorris Carnes, x-ray badges.  Machine was working properly and scout films were taken.  The patient is then prepped and draped in the usual manner.  A time-out identifying the patient as [name] and we were doing a right hip femoral neck fracture. All instrumentation  was properly positioned and working.  The OR team knew each other.  Incision was made laterally through skin and subcutaneous tissue, tensor fascia lata, vastus lateralis.  Femoral shaft was identified.  Guide pin in place.  This looked very good on AP and lateral views.  Two other pins were then placed parallel to this 1. Asnis cannulated screws were placed and measuring 100 mm, 85 mm, and 90 mm.  Permanent x-rays were taken.  Hemovac drain was placed and it was not sewn in.  The tensor fascia lata was reapproximated using interrupted figure-of-eight #1 Surgilon sutures.  The subcutaneous tissue was reapproximated using 2-0 plain and skin staples were used on the skin. Sterile dressing applied.  The patient go to recovery in good condition.          ______________________________ Shela Commons. Darreld Mclean, M.D.     JWK/MEDQ  D:  02/01/2013  T:  02/01/2013  Job:  [id number]

## 2013-02-02 ENCOUNTER — Encounter (HOSPITAL_COMMUNITY): Payer: Self-pay | Admitting: Anesthesiology

## 2013-02-02 LAB — URINE CULTURE
Colony Count: NO GROWTH
Culture: NO GROWTH

## 2013-02-02 LAB — CBC WITH DIFFERENTIAL/PLATELET
Basophils Absolute: 0 10*3/uL (ref 0.0–0.1)
Basophils Relative: 0 % (ref 0–1)
Eosinophils Absolute: 0.1 10*3/uL (ref 0.0–0.7)
Eosinophils Relative: 2 % (ref 0–5)
HCT: 31.8 % — ABNORMAL LOW (ref 36.0–46.0)
Hemoglobin: 10 g/dL — ABNORMAL LOW (ref 12.0–15.0)
Lymphocytes Relative: 11 % — ABNORMAL LOW (ref 12–46)
Lymphs Abs: 0.7 10*3/uL (ref 0.7–4.0)
MCH: 22 pg — ABNORMAL LOW (ref 26.0–34.0)
MCHC: 31.4 g/dL (ref 30.0–36.0)
MCV: 70 fL — ABNORMAL LOW (ref 78.0–100.0)
Monocytes Absolute: 0.8 10*3/uL (ref 0.1–1.0)
Monocytes Relative: 12 % (ref 3–12)
Neutro Abs: 4.8 10*3/uL (ref 1.7–7.7)
Neutrophils Relative %: 75 % (ref 43–77)
Platelets: 201 10*3/uL (ref 150–400)
RBC: 4.54 MIL/uL (ref 3.87–5.11)
RDW: 17.1 % — ABNORMAL HIGH (ref 11.5–15.5)
WBC: 6.4 10*3/uL (ref 4.0–10.5)

## 2013-02-02 LAB — BASIC METABOLIC PANEL
BUN: 14 mg/dL (ref 6–23)
CO2: 28 mEq/L (ref 19–32)
Calcium: 8.4 mg/dL (ref 8.4–10.5)
Chloride: 100 mEq/L (ref 96–112)
Creatinine, Ser: 0.84 mg/dL (ref 0.50–1.10)
GFR calc Af Amer: 68 mL/min — ABNORMAL LOW (ref >90)
GFR calc non Af Amer: 59 mL/min — ABNORMAL LOW (ref >90)
Glucose, Bld: 109 mg/dL — ABNORMAL HIGH (ref 70–99)
Potassium: 4 mEq/L (ref 3.5–5.1)
Sodium: 134 mEq/L — ABNORMAL LOW (ref 135–145)

## 2013-02-02 MED ORDER — HYDROCODONE-ACETAMINOPHEN 5-325 MG PO TABS
1.0000 | ORAL_TABLET | Freq: Four times a day (QID) | ORAL | Status: DC | PRN
Start: 2013-02-02 — End: 2013-02-04
  Administered 2013-02-02 – 2013-02-03 (×3): 1 via ORAL
  Filled 2013-02-02 (×3): qty 1

## 2013-02-02 NOTE — Progress Notes (Signed)
Did not awaken PT for 2 am , neb as PT has advanced dementia,

## 2013-02-02 NOTE — Anesthesia Postprocedure Evaluation (Signed)
Anesthesia Post Note  Patient: [name]  Procedure(s) Performed: Procedure(s) (LRB): CANNULATED HIP PINNING (Right)  Anesthesia type: Spinal  Patient location: 339  Post pain: Pain level controlled  Post assessment: Post-op Vital signs reviewed, Patient's Cardiovascular Status Stable, Respiratory Function Stable, Patent Airway, No signs of Nausea or vomiting and Pain level controlled  Post vital signs: Reviewed and stable  Level of consciousness: awake and alert   Complications: No apparent anesthesia complications

## 2013-02-02 NOTE — Progress Notes (Signed)
[facility] PROGRESS NOTE  [name] NFA:[id number] DOB: [birth year] DOA: 01/31/2013 PCP: Evlyn Courier, MD  Assessment/Plan: 1. Right hip fracture: Presumed mechanical fall. Status post surgery 5/17. Management per orthopedics. Doing well. 2. Recurrent falls: Secondary to dementia, Parkinson's and wandering behavior. 3. Advanced Alzheimer's dementia with chronic behavioral disturbances: Doing well here. Aggressive at home per her daughter. Avoid Haldol given prolonged QT. Continue Xanax. 4. Parkinson's disease: Appears to be at baseline. 5. Fever: No recurrence. Urinalysis negative, urine culture no growth. Maybe secondary to fracture.   Continue postoperative management  Anticipate transfer to skilled nursing facility less than 48 hours  Code Status: DO NOT RESUSCITATE DVT prophylaxis: Lovenox Family Communication: Discussed with daughter/healthcare power of attorney at bedside Disposition Plan: Pending physical therapy evaluation  Brendia Sacks, MD  [facility]  Pager [phone] If 7PM-7AM, please contact night-coverage at [email or web address], password [facility] 02/02/2013, 11:14 AM  LOS: 2 days   Brief narrative: 79 year old woman presented to the emergency department with her family noted her to have right hip pain when bearing weight status post presumed fall. Imaging confirmed a right hip fracture, orthopedics was consulted and admission for operative repair was requested.  Consultants:  Orthopedics  Procedures:  5/17 Cannulated hip pinning, right  HPI/Subjective: No new issues per nursing. Discussed with daughter/healthcare power of attorney at bedside--she reports patient is doing very well. Mentally at baseline. Eating very well. No concerns. No behavioral problems.  Objective: Filed Vitals:   02/02/13 0506 02/02/13 0800 02/02/13 0836 02/02/13 1112  BP: 127/61     Pulse: 69     Temp: 98.5 F (36.9 C)     TempSrc: Oral     Resp: 16 18  18   Height:       Weight:      SpO2: 95% 95% 92% 93%    Intake/Output Summary (Last 24 hours) at 02/02/13 1114 Last data filed at 02/02/13 0615  Gross per 24 hour  Intake    400 ml  Output    740 ml  Net   -340 ml   Filed Weights   01/31/13 1203 01/31/13 1941  Weight: 56.246 kg (124 lb) 57.4 kg (126 lb 8.7 oz)    Exam:  General:  Appears calm and comfortable. Cardiovascular: RRR, no m/r/g. 1+ LE edema. Respiratory: CTA bilaterally, no w/r/r. Normal respiratory effort.  Data Reviewed: Basic Metabolic Panel:  Recent Labs Lab 01/31/13 1630 02/02/13 0850  NA 137 134*  K 4.4 4.0  CL 100 100  CO2 28 28  GLUCOSE 120* 109*  BUN 13 14  CREATININE 0.71 0.84  CALCIUM 9.4 8.4   Liver Function Tests:  Recent Labs Lab 01/31/13 1630  AST 23  ALT 25  ALKPHOS 97  BILITOT 0.8  PROT 7.8  ALBUMIN 3.3*   CBC:  Recent Labs Lab 01/31/13 1630 02/02/13 0850  WBC 15.3* 6.4  NEUTROABS 13.2* 4.8  HGB 10.9* 10.0*  HCT 34.1* 31.8*  MCV 69.9* 70.0*  PLT 323 201     Recent Results (from the past 240 hour(s))  URINE CULTURE     Status: None   Collection Time    01/31/13  5:45 PM      Result Value Range Status   Specimen Description URINE, CATHETERIZED   Final   Special Requests NONE   Final   Culture  Setup Time 02/01/2013 01:33   Final   Colony Count NO GROWTH   Final   Culture NO GROWTH   Final   Report  Status 02/02/2013 FINAL   Final  SURGICAL PCR SCREEN     Status: Abnormal   Collection Time    01/31/13  9:20 PM      Result Value Range Status   MRSA, PCR NEGATIVE  NEGATIVE Final   Staphylococcus aureus POSITIVE (*) NEGATIVE Final   Comment:            The Xpert SA Assay (FDA     approved for NASAL specimens     in patients over 66 years of age),     is one component of     a comprehensive surveillance     program.  Test performance has     been validated by [employer] for patients greater     than or equal to 51 year old.     It is not intended     to diagnose  infection nor to     guide or monitor treatment.     RESULT CALLED TO, READ BACK BY AND VERIFIED WITH:     M. SIMPSON AT 2314 ON 01/31/13 BY Wynonia Lawman     Studies: Dg Hip Complete Right  01/31/2013   *RADIOLOGY REPORT*  Clinical Data: Fall with right hip pain.  RIGHT HIP - COMPLETE 2+ VIEW  Comparison: 02/16/2011  Findings: Surgical changes about the right hemi pelvis.  Mild osteopenia. Femoral heads are located.  Irregularity about the symphysis pubis bilaterally is grossly similar and favored to be degenerative. No acute fracture.  There is degenerative irregularity about the lateral aspect of the right femoral head.  IMPRESSION: Osteopenia and degenerative change. No acute osseous abnormality.   Original Report Authenticated By: Jeronimo Greaves, M.D.   Dg Hip Operative Right  02/01/2013   *RADIOLOGY REPORT*  Clinical Data: Hip pain.  DG OPERATIVE RIGHT HIP  Comparison: MRI and plain films earlier in the day.  Findings: Placement of screw fixation device across the previously described femoral fracture. No acute hardware complication.  IMPRESSION: Internal fixation of subcapital right femoral head fracture.   Original Report Authenticated By: Jeronimo Greaves, M.D.   Ct Head Wo Contrast  01/31/2013   *RADIOLOGY REPORT*  Clinical Data:  Fall.  Mental status change  CT HEAD WITHOUT CONTRAST CT CERVICAL SPINE WITHOUT CONTRAST  Technique:  Multidetector CT imaging of the head and cervical spine was performed following the standard protocol without intravenous contrast.  Multiplanar CT image reconstructions of the cervical spine were also generated.  Comparison:  07/26/2012  CT HEAD  Findings: Moderate atrophy.  Mild chronic microvascular ischemic change in the white matter.  No acute infarct, hemorrhage, or mass. Negative for skull fracture.  Small scalp hematoma right frontal region.  IMPRESSION: No acute intracranial abnormality.  CT CERVICAL SPINE  Findings: Mild cervical kyphosis.  1 mm anterior slip C4-5.  Moderate disc degeneration and spondylosis C3-4, C4-5, C5-6, C6-7. 1 mm anterior slip C7-T1.  Negative for fracture or mass.  Diffuse facet hypertrophy is present.  IMPRESSION: Moderately advanced degenerative changes throughout the cervical spine.    No fracture identified.   Original Report Authenticated By: Janeece Riggers, M.D.   Ct Cervical Spine Wo Contrast  01/31/2013   *RADIOLOGY REPORT*  Clinical Data:  Fall.  Mental status change  CT HEAD WITHOUT CONTRAST CT CERVICAL SPINE WITHOUT CONTRAST  Technique:  Multidetector CT imaging of the head and cervical spine was performed following the standard protocol without intravenous contrast.  Multiplanar CT image reconstructions of the cervical spine were  also generated.  Comparison:  07/26/2012  CT HEAD  Findings: Moderate atrophy.  Mild chronic microvascular ischemic change in the white matter.  No acute infarct, hemorrhage, or mass. Negative for skull fracture.  Small scalp hematoma right frontal region.  IMPRESSION: No acute intracranial abnormality.  CT CERVICAL SPINE  Findings: Mild cervical kyphosis.  1 mm anterior slip C4-5. Moderate disc degeneration and spondylosis C3-4, C4-5, C5-6, C6-7. 1 mm anterior slip C7-T1.  Negative for fracture or mass.  Diffuse facet hypertrophy is present.  IMPRESSION: Moderately advanced degenerative changes throughout the cervical spine.    No fracture identified.   Original Report Authenticated By: Janeece Riggers, M.D.   Mr Hip Right Wo Contrast  01/31/2013   *RADIOLOGY REPORT*  Clinical Data: Status post fall 1 day ago.  Right hip pain.  MRI OF THE RIGHT HIP WITHOUT CONTRAST  Technique:  Multiplanar, multisequence MR imaging was performed. No intravenous contrast was administered.  Comparison: Plain films 01/31/2013.  Findings: The patient has an acute subcapital fracture of the right hip.  Associated marrow edema and small joint effusion are noted. No other fracture is identified.  There is only mild degenerative change  about the hips.  Also seen is a T2 hyperintense collection in the deep subcutaneous fat of the left buttock measuring 1.8 cm transverse by 3.2 cm AP by 2.3 cm cranial-caudal compatible with an old hematoma or less likely fat necrosis.  Sacroiliac joints and symphysis pubis are unremarkable. Imaged intrapelvic demonstrated a distended urinary bladder.  IMPRESSION:  1.  Study is positive for an acute subcapital fracture of the right hip. 2.  Likely old hematoma left buttock. 3.  Distend urinary bladder.  Findings were called to Dr. Preston Fleeting at the time of interpretation.   Original Report Authenticated By: Holley Dexter, M.D.    Scheduled Meds: . acetaminophen  1,000 mg Intravenous Q6H  . acetaminophen  500 mg Oral QHS  . albuterol  2.5 mg Nebulization Q6H  . ALPRAZolam  0.5 mg Oral TID  . aspirin EC  81 mg Oral q morning - 10a  . benztropine  1 mg Oral Q1400  . bumetanide  0.5 mg Oral Daily  . Chlorhexidine Gluconate Cloth  6 each Topical Daily  . citalopram  20 mg Oral q morning - 10a  . enoxaparin (LOVENOX) injection  40 mg Subcutaneous Q24H  . metoprolol  25 mg Oral q morning - 10a  . mupirocin ointment  1 application Nasal BID  . rivastigmine  9.5 mg Transdermal Daily  . zolpidem  5 mg Oral QHS   Continuous Infusions:   Principal Problem:   Closed right hip fracture Active Problems:   Fall   Dementia with behavioral disturbance   Parkinson disease     Brendia Sacks, MD  [facility] Pager [phone] If 7PM-7AM, please contact night-coverage at [email or web address], password [facility] 02/02/2013, 11:14 AM  LOS: 2 days   Time spent: 20 minutes

## 2013-02-02 NOTE — Progress Notes (Signed)
Subjective: 1 Day Post-Op Procedure(s) (LRB): CANNULATED HIP PINNING (Right) Patient reports pain as 4 on 0-10 scale.    Objective: Vital signs in last 24 hours: Temp:  [97.6 F (36.4 C)-99 F (37.2 C)] 98.5 F (36.9 C) (05/18 0506) Pulse Rate:  [69-126] 69 (05/18 0506) Resp:  [15-20] 16 (05/18 0506) BP: (111-129)/(48-78) 127/61 mmHg (05/18 0506) SpO2:  [93 %-100 %] 95 % (05/18 0506)  Intake/Output from previous day: 05/17 0701 - 05/18 0700 In: 1100 [P.O.:100; I.V.:700; IV Piggyback:300] Out: 890 [Urine:800; Drains:40; Blood:50] Intake/Output this shift:     Recent Labs  01/31/13 1630  HGB 10.9*    Recent Labs  01/31/13 1630  WBC 15.3*  RBC 4.88  HCT 34.1*  PLT 323    Recent Labs  01/31/13 1630  NA 137  K 4.4  CL 100  CO2 28  BUN 13  CREATININE 0.71  GLUCOSE 120*  CALCIUM 9.4    Recent Labs  01/31/13 1710  INR 1.05    Neurovascular intact Sensation intact distally Intact pulses distally Dorsiflexion/Plantar flexion intact  Assessment/Plan: 1 Day Post-Op Procedure(s) (LRB): CANNULATED HIP PINNING (Right) Up with therapy  [name] 02/02/2013, 8:11 AM

## 2013-02-02 NOTE — Evaluation (Signed)
Physical Therapy Evaluation Patient Details Name: [name] MRN: [id number] DOB: 02-02-1920 Today's Date: 02/02/2013 Time: 6578-4696 PT Time Calculation (min): 30 min  PT Assessment / Plan / Recommendation Clinical Impression  Pt during visit for evaluation has decreased alertness and lethargic; per daughter, pt has just taken her pain medication prior to my arrival. Pt during passive movement to RLE c/o burning/aching pain; pt kept her eyes closed at most times during session, however, able to respond and follow simple commands. Pt was able to tolerate sitting on EOB for <63mins with UE support. Sit to stand activities not performed due to pt's level of cognition and may not follow NWB status at this time.     PT Assessment  Patient needs continued PT services    Follow Up Recommendations  SNF    Does the patient have the potential to tolerate intense rehabilitation      Barriers to Discharge        Equipment Recommendations       Recommendations for Other Services     Frequency Min 5X/week    Precautions / Restrictions Restrictions Weight Bearing Restrictions: Yes RLE Weight Bearing: Non weight bearing         Mobility  Bed Mobility Bed Mobility: Supine to Sit;Sit to Supine;Sitting - Scoot to Edge of Bed Supine to Sit: 2: Max assist Sitting - Scoot to [place] of Bed: 2: Max assist Sit to Supine: 2: Max assist Transfers Transfers: Not assessed Details for Transfer Assistance: Not assessed due to patient's lethargy/decreased wakefulness     Exercises Total Joint Exercises Ankle Circles/Pumps: AAROM;Both;Supine;20 reps Short Arc Quad: PROM;Both;20 reps Hip ABduction/ADduction: PROM;20 reps;Both;Supine Straight Leg Raises: PROM;Both;20 reps;Supine Knee Flexion: PROM;Both;Supine;20 reps   PT Diagnosis: Generalized weakness;Acute pain  PT Problem List: Decreased strength;Decreased range of motion;Decreased activity tolerance;Decreased balance;Decreased  mobility;Decreased cognition;Pain PT Treatment Interventions: Gait training;Stair training;Functional mobility training;Therapeutic activities;Therapeutic exercise;Balance training;Neuromuscular re-education   PT Goals Acute Rehab PT Goals PT Goal Formulation: With patient/family Pt will go Supine/Side to Sit: with mod assist PT Goal: Supine/Side to Sit - Progress: Goal set today Pt will go Sit to Supine/Side: with mod assist PT Goal: Sit to Supine/Side - Progress: Goal set today Pt will go Sit to Stand: with mod assist PT Goal: Sit to Stand - Progress: Goal set today Pt will Transfer Bed to Chair/Chair to Bed: with mod assist PT Transfer Goal: Bed to Chair/Chair to Bed - Progress: Goal set today Pt will Ambulate: 1 - 15 feet;with mod assist;with rolling walker PT Goal: Ambulate - Progress: Goal set today  Visit Information  Last PT Received On: 02/02/13    Subjective Data  Subjective: Pt c/o aching and burning pain to R hip Patient Stated Goal: Pt unable to state goal due to decreased alertness during session    Prior Functioning  Home Living Lives With: Family Type of Home: House Home Access: Stairs to enter Entrance Stairs-Rails: Right;Left Home Layout: One level Prior Function Level of Independence: Independent Able to Take Stairs?: Yes Vocation: Retired Musician: No difficulties    Copywriter, advertising Arousal/Alertness: Youth worker During Therapy: Anxious    Extremity/Trunk Assessment     Balance    End of Session PT - End of Session Activity Tolerance: Patient limited by pain;Treatment limited secondary to medication Patient left: with call bell/phone within reach;with bed alarm set;with family/visitor present;in bed Nurse Communication: Weight bearing status  GP     [name], Larna Daughters 02/02/2013, 12:47 PM

## 2013-02-03 ENCOUNTER — Encounter (HOSPITAL_COMMUNITY): Payer: Self-pay | Admitting: Orthopaedic Surgery

## 2013-02-03 LAB — CBC WITH DIFFERENTIAL/PLATELET
Basophils Absolute: 0 10*3/uL (ref 0.0–0.1)
Basophils Relative: 0 % (ref 0–1)
Eosinophils Absolute: 0.1 10*3/uL (ref 0.0–0.7)
Eosinophils Relative: 2 % (ref 0–5)
HCT: 29.5 % — ABNORMAL LOW (ref 36.0–46.0)
Hemoglobin: 9.3 g/dL — ABNORMAL LOW (ref 12.0–15.0)
Lymphocytes Relative: 21 % (ref 12–46)
Lymphs Abs: 1.2 10*3/uL (ref 0.7–4.0)
MCH: 22.1 pg — ABNORMAL LOW (ref 26.0–34.0)
MCHC: 31.5 g/dL (ref 30.0–36.0)
MCV: 70.1 fL — ABNORMAL LOW (ref 78.0–100.0)
Monocytes Absolute: 1.1 10*3/uL — ABNORMAL HIGH (ref 0.1–1.0)
Monocytes Relative: 19 % — ABNORMAL HIGH (ref 3–12)
Neutro Abs: 3.2 10*3/uL (ref 1.7–7.7)
Neutrophils Relative %: 57 % (ref 43–77)
Platelets: 215 10*3/uL (ref 150–400)
RBC: 4.21 MIL/uL (ref 3.87–5.11)
RDW: 17.2 % — ABNORMAL HIGH (ref 11.5–15.5)
WBC: 5.6 10*3/uL (ref 4.0–10.5)

## 2013-02-03 MED ORDER — ONDANSETRON HCL 4 MG/2ML IJ SOLN: 4.0000 mg | Freq: Four times a day (QID) | INTRAMUSCULAR | Status: DC | PRN

## 2013-02-03 MED ORDER — HYDROCODONE-ACETAMINOPHEN 5-325 MG PO TABS: 1.0000 | ORAL_TABLET | Freq: Four times a day (QID) | ORAL | Status: DC | PRN

## 2013-02-03 MED ORDER — ZOLPIDEM TARTRATE 10 MG PO TABS: 5.0000 mg | ORAL_TABLET | Freq: Every day | ORAL | Status: DC

## 2013-02-03 MED ORDER — DICLOFENAC SODIUM 1 % TD GEL: 2.0000 g | Freq: Two times a day (BID) | TRANSDERMAL | Status: DC | PRN

## 2013-02-03 MED ORDER — ALPRAZOLAM 0.5 MG PO TABS: 0.5000 mg | ORAL_TABLET | Freq: Three times a day (TID) | ORAL | Status: DC | PRN

## 2013-02-03 MED ORDER — ENOXAPARIN SODIUM 40 MG/0.4ML ~~LOC~~ SOLN: 40.0000 mg | SUBCUTANEOUS | Status: DC

## 2013-02-03 NOTE — Clinical Social Work Psychosocial (Signed)
Clinical Social Work Department BRIEF PSYCHOSOCIAL ASSESSMENT 02/03/2013  Patient:  [name], [name]     Account Number:  [id number]     Admit date:  01/31/2013  Clinical Social Worker:  Nancie Neas  Date/Time:  02/03/2013 11:10 AM  Referred by:  Physician  Date Referred:  02/03/2013 Referred for  SNF Placement   Other Referral:   Interview type:  Family Other interview type:   daughter- [name]    PSYCHOSOCIAL DATA Living Status:  FAMILY Admitted from facility:   Level of care:   Primary support name:  [name] Primary support relationship to patient:  CHILD, ADULT Degree of support available:   supportive    CURRENT CONCERNS Current Concerns  Post-Acute Placement   Other Concerns:    SOCIAL WORK ASSESSMENT / PLAN CSW met with pt's daughter and sister at bedside. Pt just completed therapy and in chair, but asleep. Pt's daughter, Unk Pinto, reports she lives with pt. At baseline, pt ambulates independently, but requires assistance with feeding, dressing, and bathing. Family appear to be involved and supportive and [name] said she is with pt around the clock. Pt fell at some point over night and [name] did not hear her. She got up in the morning and found her on the floor. When they tried to get her back up, pt screamed in pain so they brought her to ED where she was found to have hip fracture. Pt had surgery on Saturday and has progressed fairly well. Stable for d/c today. CSW discussed placement process and provided SNF list. Pt has never been to rehab before. Pt has advanced dementia but manages well at home with her daughter.   Assessment/plan status:  Psychosocial Support/Ongoing Assessment of Needs Other assessment/ plan:   Information/referral to community resources:   SNF list    PATIENT'S/FAMILY'S RESPONSE TO PLAN OF CARE: Pt unable to discuss plan of care due to dementia. Family agreeable to SNF for PT. CSW will follow up with bed offers when available and assist with  d/c.       [name], LCSW [phone]

## 2013-02-03 NOTE — Progress Notes (Addendum)
[facility] PROGRESS NOTE  [name] GEX:[id number] DOB: 11-07-1919 DOA: 01/31/2013 PCP: Evlyn Courier, MD  Addendum 1820: Discussed with social work earlier. Daughter felt she needed more time to make a decision about facility. Discussed with healthcare power of attorney/daughter at bedside this evening with Tamika RN present. Bed available at [facility]. Per family there is a family member that works at this facility and daughter is very pleased with arrangements. She was very thankful for care and agreed with discharge.  Assessment/Plan: 1. Right hip fracture: Presumed mechanical fall. Status post surgery 5/17. Doing well. Discussed with Dr. Marshell Levan in. Stable for discharge. 2. Recurrent falls: Secondary to dementia, Parkinson's and wandering behavior. 3. Advanced Alzheimer's dementia with chronic behavioral disturbances: No acute issues. Aggressive at home per her daughter. Avoid Haldol given prolonged QT. Continue Xanax. 4. Parkinson's disease: Appears to be at baseline. 5. Fever: No recurrence. Urinalysis negative, urine culture no growth. Likely secondary to fracture.   She will need to have staples removed on 10th post op day, 5/27, Steri-strip wound.  She will need physical therapy, toe touch on the right.   She will need to see Dr. Hilda Lias in the office in one month with x-rays of the right hip prior to the visit.   She will need enoxaparin for one month daily.  Continue dysphagia 2 diet, thin liquids  Discharge to skilled nursing facility today  Code Status: DO NOT RESUSCITATE DVT prophylaxis: Lovenox Family Communication: Discussed with daughter/healthcare power of attorney at bedside 5/19, she concurs with discharge Disposition Plan: Pending physical therapy evaluation  Brendia Sacks, MD  [facility]  Pager [phone] If 7PM-7AM, please contact night-coverage at [email or web address], password [facility] 02/03/2013, 10:21 AM  LOS: 3 days   Brief  narrative: 79 year old woman presented to the emergency department with her family noted her to have right hip pain when bearing weight status post presumed fall. Imaging confirmed a right hip fracture, orthopedics was consulted and admission for operative repair was requested.  Consultants:  Orthopedics  Physical therapy: Skilled nursing facility  Procedures:  5/17 Cannulated hip pinning, right  HPI/Subjective: Per daughter at bedside patient doing well, eating very well. Mentally doing as usual. Some hallucinations which she has attributed to albuterol.  Objective: Filed Vitals:   02/02/13 1939 02/02/13 2048 02/03/13 0411 02/03/13 0755  BP:  117/67 133/75   Pulse:  71 73   Temp:  99.4 F (37.4 C) 98.9 F (37.2 C)   TempSrc:  Oral Oral   Resp:  18 18   Height:      Weight:      SpO2: 95% 95% 95% 94%    Intake/Output Summary (Last 24 hours) at 02/03/13 1021 Last data filed at 02/03/13 0656  Gross per 24 hour  Intake      0 ml  Output   1455 ml  Net  -1455 ml   Filed Weights   01/31/13 1203 01/31/13 1941  Weight: 56.246 kg (124 lb) 57.4 kg (126 lb 8.7 oz)    Exam:  General:  Appears calm and comfortable. Nontoxic. Cardiovascular: RRR, no m/r/g. 1+ LE edema. Respiratory: CTA bilaterally, no w/r/r. Normal respiratory effort. Abdomen soft, nondistended, nontender Musculoskeletal: Moves extremities see to command. Psychiatric: Confused but without significant change.  Data Reviewed: Basic Metabolic Panel:  Recent Labs Lab 01/31/13 1630 02/02/13 0850  NA 137 134*  K 4.4 4.0  CL 100 100  CO2 28 28  GLUCOSE 120* 109*  BUN 13 14  CREATININE 0.71  0.84  CALCIUM 9.4 8.4   Liver Function Tests:  Recent Labs Lab 01/31/13 1630  AST 23  ALT 25  ALKPHOS 97  BILITOT 0.8  PROT 7.8  ALBUMIN 3.3*   CBC:  Recent Labs Lab 01/31/13 1630 02/02/13 0850 02/03/13 0601  WBC 15.3* 6.4 5.6  NEUTROABS 13.2* 4.8 3.2  HGB 10.9* 10.0* 9.3*  HCT 34.1* 31.8* 29.5*   MCV 69.9* 70.0* 70.1*  PLT 323 201 215     Recent Results (from the past 240 hour(s))  URINE CULTURE     Status: None   Collection Time    01/31/13  5:45 PM      Result Value Range Status   Specimen Description URINE, CATHETERIZED   Final   Special Requests NONE   Final   Culture  Setup Time 02/10/2013 01:33   Final   Colony Count NO GROWTH   Final   Culture NO GROWTH   Final   Report Status 02/02/2013 FINAL   Final  SURGICAL PCR SCREEN     Status: Abnormal   Collection Time    01/31/13  9:20 PM      Result Value Range Status   MRSA, PCR NEGATIVE  NEGATIVE Final   Staphylococcus aureus POSITIVE (*) NEGATIVE Final   Comment:            The Xpert SA Assay (FDA     approved for NASAL specimens     in patients over 35 years of age),     is one component of     a comprehensive surveillance     program.  Test performance has     been validated by [employer] for patients greater     than or equal to 32 year old.     It is not intended     to diagnose infection nor to     guide or monitor treatment.     RESULT CALLED TO, READ BACK BY AND VERIFIED WITH:     M. SIMPSON AT 2314 ON 01/31/13 BY Wynonia Lawman     Studies: Dg Hip Operative Right  Feb 10, 2013   *RADIOLOGY REPORT*  Clinical Data: Hip pain.  DG OPERATIVE RIGHT HIP  Comparison: MRI and plain films earlier in the day.  Findings: Placement of screw fixation device across the previously described femoral fracture. No acute hardware complication.  IMPRESSION: Internal fixation of subcapital right femoral head fracture.   Original Report Authenticated By: Jeronimo Greaves, M.D.    Scheduled Meds: . acetaminophen  500 mg Oral QHS  . albuterol  2.5 mg Nebulization Q6H  . ALPRAZolam  0.5 mg Oral TID  . aspirin EC  81 mg Oral q morning - 10a  . benztropine  1 mg Oral Q1400  . bumetanide  0.5 mg Oral Daily  . Chlorhexidine Gluconate Cloth  6 each Topical Daily  . citalopram  20 mg Oral q morning - 10a  . enoxaparin (LOVENOX)  injection  40 mg Subcutaneous Q24H  . metoprolol  25 mg Oral q morning - 10a  . mupirocin ointment  1 application Nasal BID  . rivastigmine  9.5 mg Transdermal Daily  . zolpidem  5 mg Oral QHS   Continuous Infusions:   Principal Problem:   Closed right hip fracture Active Problems:   Fall   Dementia with behavioral disturbance   Parkinson disease     Brendia Sacks, MD  [facility] Pager [phone] If 7PM-7AM, please contact night-coverage at  [email or web address], password [facility] 02/03/2013, 10:21 AM  LOS: 3 days

## 2013-02-03 NOTE — Progress Notes (Signed)
Utilization Review Complete  

## 2013-02-03 NOTE — Progress Notes (Signed)
Physical Therapy Treatment Patient Details Name: [name] MRN: [id number] DOB: 1919/12/10 Today's Date: 02/03/2013 Time: 7829-5621 PT Time Calculation (min): 53 min  PT Assessment / Plan / Recommendation Comments on Treatment Session  Pt was very lethargic and had difficulty following directions.  She keeps her eyes closed most of the time but will open them when asked to do so.  We had minimal luck with ther ex as she tends to resist all hip motion.  She required max assist to sit at EOB and pivot bed to chair.  She appears to be tolerating sitting fairly well.    Follow Up Recommendations        Does the patient have the potential to tolerate intense rehabilitation     Barriers to Discharge        Equipment Recommendations       Recommendations for Other Services    Frequency     Plan Discharge plan remains appropriate    Precautions / Restrictions     Pertinent Vitals/Pain     Mobility  Bed Mobility Supine to Sit: 2: Max assist;HOB elevated Transfers Transfers: Stand Pivot Transfers Stand Pivot Transfers: 2: Max assist Details for Transfer Assistance: pt is able to stand at EOB and weight bear on LLE, min weight on RLE during transfer Ambulation/Gait Ambulation/Gait Assistance: Not tested (comment)    Exercises General Exercises - Lower Extremity Ankle Circles/Pumps: AAROM;Both;10 reps;Supine Heel Slides: Both;5 reps;Supine;PROM (pt resists any hip motion) Hip ABduction/ADduction: Both;5 reps;Supine;PROM (pt resists hip motion)   PT Diagnosis:    PT Problem List:   PT Treatment Interventions:     PT Goals Acute Rehab PT Goals PT Goal: Supine/Side to Sit - Progress: Progressing toward goal PT Transfer Goal: Bed to Chair/Chair to Bed - Progress: Progressing toward goal  Visit Information  Last PT Received On: 02/03/13    Subjective Data      Cognition       Balance     End of Session PT - End of Session Equipment Utilized During Treatment: Gait  belt Activity Tolerance: Patient tolerated treatment well Patient left: in chair;with call bell/phone within reach;with chair alarm set Nurse Communication: Mobility status   GP     Konrad Penta 02/03/2013, 10:51 AM

## 2013-02-03 NOTE — Care Management Note (Addendum)
    Page 1 of 1   02/03/2013     3:59:22 PM   CARE MANAGEMENT NOTE 02/03/2013  Patient:  [name], [name]   Account Number:  [id number]  Date Initiated:  02/03/2013  Documentation initiated by:  Rosemary Holms  Subjective/Objective Assessment:   Pt admitted from home where she lived with her daughter. after hip surgery pt will go to rehab facility.     Action/Plan:   Anticipated DC Date:  02/03/2013   Anticipated DC Plan:  SKILLED NURSING FACILITY  In-house referral  Clinical Social Worker      DC Planning Services  CM consult      Choice offered to / List presented to:             Status of service:  Completed, signed off Medicare Important Message given?  YES (If response is "NO", the following Medicare IM given date fields will be blank) Date Medicare IM given:  02/03/2013 Date Additional Medicare IM given:    Discharge Disposition:  SKILLED NURSING FACILITY  Per UR Regulation:    If discussed at Long Length of Stay Meetings, dates discussed:    Comments:  02/03/13 Rosemary Holms RN BSN CM [id number] Spoke to daughter regarding discharge. Reviewed IM again and reviewed appeal process Daughter states she feels her mother is being pushed out and she will report [place] to Medicare.

## 2013-02-03 NOTE — Clinical Social Work Note (Signed)
[facility] requested to assess pt. CSW requested permission by pt's daughter who was agreeable. CSW followed up and [facility] is not able to offer a bed. The only bed offer available for [facility] is [facility]. It was family's request to keep pt in [facility]. [name] states that she must tour facility before making a decision. CSW reminded her that pt is d/c today. She said that she had until midnight tonight to make decision then. [name] very upset that pt had surgery on Saturday and we are already "pushing her out." [name] accused CSW of telling her that bed was available at [facility] for pt. CSW never told her a bed was open for pt at [facility]. She became so frustrated that she said she was taking pt home and said that staff didn't care where her mother went. CSW told her that we felt SNF was best option, but it was certainly her option to take her home. [name] then backed off and said, "You know I can't take her home." CSW offered for her to speak with department director but she was not ready. CSW notified [facility] of situation and that [facility] would be touring this afternoon. Tresa Endo at facility plans to initiate preauthorization. Department director notified of situation as well as MD.   [name], LCSW [phone]

## 2013-02-03 NOTE — Progress Notes (Signed)
Subjective: 2 Days Post-Op Procedure(s) (LRB): CANNULATED HIP PINNING (Right) Patient reports pain as confused, pain appears controlled well..    Objective: Vital signs in last 24 hours: Temp:  [98.9 F (37.2 C)-99.4 F (37.4 C)] 98.9 F (37.2 C) (05/19 0411) Pulse Rate:  [71-73] 73 (05/19 0411) Resp:  [18] 18 (05/19 0411) BP: (117-133)/(67-75) 133/75 mmHg (05/19 0411) SpO2:  [89 %-95 %] 95 % (05/19 0411)  Intake/Output from previous day: 05/18 0701 - 05/19 0700 In: -  Out: 1455 [Urine:1450; Drains:5] Intake/Output this shift:     Recent Labs  01/31/13 1630 02/02/13 0850 02/03/13 0601  HGB 10.9* 10.0* 9.3*    Recent Labs  02/02/13 0850 02/03/13 0601  WBC 6.4 5.6  RBC 4.54 4.21  HCT 31.8* 29.5*  PLT 201 215    Recent Labs  01/31/13 1630 02/02/13 0850  NA 137 134*  K 4.4 4.0  CL 100 100  CO2 28 28  BUN 13 14  CREATININE 0.71 0.84  GLUCOSE 120* 109*  CALCIUM 9.4 8.4    Recent Labs  01/31/13 1710  INR 1.05    Neurovascular intact Sensation intact distally Intact pulses distally Dorsiflexion/Plantar flexion intact Incision: scant drainage  Hemovac removed.  Vitals stable.  She could go to skilled care when bed available.  Assessment/Plan: 2 Days Post-Op Procedure(s) (LRB): CANNULATED HIP PINNING (Right) Up with therapy  She can go to SNF. She will need to have staples removed on 10th post op day, Steri-strip woudn. She will need physical therapy, toe touch on the right. She will need to see me in the office in one month with x-rays of the right hip prior to the visit. She will need enoxaparin for one month daily.  Bretta Fees 02/03/2013, 7:53 AM

## 2013-02-03 NOTE — Discharge Summary (Signed)
Physician Discharge Summary  [name] ZOX:[id number] DOB: [birth year] DOA: 01/31/2013  PCP: Evlyn Courier, MD  Admit date: 01/31/2013 Discharge date: 02/03/2013  Recommendations for Outpatient Follow-up:  1. Followup physical therapy for hip fracture. Toe touch on the right. 2. Remove staples 5/27 and Steri-Strip wound. 3. Followup with Dr. Hilda Lias one month with x-ray right hip prior to visit. 4. Followup recurrent falls. 5. Continue treatment for advanced Alzheimer's dementia with chronic behavioral disturbances. Haldol discontinued secondary to prolonged QT on screening EKG.  Follow-up Information   Follow up with [facility], MD In 2 weeks.   Contact information:   [address] [place] [address] [phone]       Follow up with Darreld Mclean, MD. Schedule an appointment as soon as possible for a visit in 1 month.   Contact information:   [address] [place] [address] [phone]      Discharge Diagnoses:  1. Right hip fracture status post presumed mechanical fall 2. Recurrent falls prior to admission 3. Advanced Alzheimer's dementia with chronic behavioral disturbances 4. Parkinson's disease  Discharge Condition: Improved Disposition: Skilled nursing facility for rehabilitation  Diet recommendation: Dysphasia 2 diet, thin liquids  Filed Weights   01/31/13 1203 01/31/13 1941  Weight: 56.246 kg (124 lb) 57.4 kg (126 lb 8.7 oz)    History of present illness:  79 year old woman presented to the emergency department with her family noted her to have right hip pain when bearing weight status post presumed fall. Imaging confirmed a right hip fracture, orthopedics was consulted and admission for operative repair was requested.  Hospital Course:  Ms. Orrick underwent successful hip surgery for fracture stabilization without apparent complication. Her hospitalization an uncomplicated and she is now stable for discharge. Individual issues as  below.  1. Right hip fracture: Presumed mechanical fall. Status post surgery 5/17. Doing well. Discussed with Dr. Marshell Levan in. Stable for discharge. 2. Recurrent falls: Secondary to dementia, Parkinson's and wandering behavior. 3. Advanced Alzheimer's dementia with chronic behavioral disturbances: No acute issues. Aggressive at home per her daughter. Avoid Haldol given prolonged QT. Continue Xanax. 4. Parkinson's disease: Appears to be at baseline.  She will need to have staples removed on 10th post op day, 5/27, Steri-strip wound. She will need physical therapy, toe touch on the right. She will need to see Dr. Hilda Lias in the office in one month with x-rays of the right hip prior to the visit. She will need enoxaparin for one month daily. Continue dysphagia 2 diet, thin liquids   Discharge Instructions     Medication List    STOP taking these medications       haloperidol 2 MG/ML solution  Commonly known as:  HALDOL      TAKE these medications       acetaminophen 500 MG tablet  Commonly known as:  TYLENOL  Take 500 mg by mouth at bedtime. For pain     ALPRAZolam 0.5 MG tablet  Commonly known as:  XANAX  Take 1 tablet (0.5 mg total) by mouth 3 (three) times daily as needed for anxiety. For anxiety     aspirin EC 81 MG tablet  Take 81 mg by mouth every morning.     benztropine 1 MG tablet  Commonly known as:  COGENTIN  Take 1 mg by mouth daily. At 1400 (2pm)     bumetanide 0.5 MG tablet  Commonly known as:  BUMEX  Take 0.5 mg by mouth daily.  citalopram 20 MG tablet  Commonly known as:  CELEXA  Take 20 mg by mouth every morning.     diclofenac sodium 1 % Gel  Commonly known as:  VOLTAREN  Apply 2 g topically 2 (two) times daily as needed (arm pain). For arm pain     enoxaparin 40 MG/0.4ML injection  Commonly known as:  LOVENOX  Inject 0.4 mLs (40 mg total) into the skin daily. Last dose June 17.     HYDROcodone-acetaminophen 5-325 MG per tablet  Commonly known  as:  NORCO/VICODIN  Take 1 tablet by mouth every 6 (six) hours as needed (severe pain).     metoprolol 50 MG tablet  Commonly known as:  LOPRESSOR  Take 25 mg by mouth every morning.     rivastigmine 9.5 mg/24hr  Commonly known as:  EXELON  Place 1 patch onto the skin daily. *Remove and discard used patches*     zolpidem 10 MG tablet  Commonly known as:  AMBIEN  Take 0.5 tablets (5 mg total) by mouth at bedtime.       Allergies  Allergen Reactions  . Penicillins Swelling and Rash    The results of significant diagnostics from this hospitalization (including imaging, microbiology, ancillary and laboratory) are listed below for reference.    Significant Diagnostic Studies: Dg Hip Complete Right  01/31/2013   *RADIOLOGY REPORT*  Clinical Data: Fall with right hip pain.  RIGHT HIP - COMPLETE 2+ VIEW  Comparison: 02/16/2011  Findings: Surgical changes about the right hemi pelvis.  Mild osteopenia. Femoral heads are located.  Irregularity about the symphysis pubis bilaterally is grossly similar and favored to be degenerative. No acute fracture.  There is degenerative irregularity about the lateral aspect of the right femoral head.  IMPRESSION: Osteopenia and degenerative change. No acute osseous abnormality.   Original Report Authenticated By: Jeronimo Greaves, M.D.   Dg Hip Operative Right  02/01/2013   *RADIOLOGY REPORT*  Clinical Data: Hip pain.  DG OPERATIVE RIGHT HIP  Comparison: MRI and plain films earlier in the day.  Findings: Placement of screw fixation device across the previously described femoral fracture. No acute hardware complication.  IMPRESSION: Internal fixation of subcapital right femoral head fracture.   Original Report Authenticated By: Jeronimo Greaves, M.D.   Ct Head Wo Contrast  01/31/2013   *RADIOLOGY REPORT*  Clinical Data:  Fall.  Mental status change  CT HEAD WITHOUT CONTRAST CT CERVICAL SPINE WITHOUT CONTRAST  Technique:  Multidetector CT imaging of the head and cervical  spine was performed following the standard protocol without intravenous contrast.  Multiplanar CT image reconstructions of the cervical spine were also generated.  Comparison:  07/26/2012  CT HEAD  Findings: Moderate atrophy.  Mild chronic microvascular ischemic change in the white matter.  No acute infarct, hemorrhage, or mass. Negative for skull fracture.  Small scalp hematoma right frontal region.  IMPRESSION: No acute intracranial abnormality.  CT CERVICAL SPINE  Findings: Mild cervical kyphosis.  1 mm anterior slip C4-5. Moderate disc degeneration and spondylosis C3-4, C4-5, C5-6, C6-7. 1 mm anterior slip C7-T1.  Negative for fracture or mass.  Diffuse facet hypertrophy is present.  IMPRESSION: Moderately advanced degenerative changes throughout the cervical spine.    No fracture identified.   Original Report Authenticated By: Janeece Riggers, M.D.   Mr Hip Right Wo Contrast  01/31/2013   *RADIOLOGY REPORT*  Clinical Data: Status post fall 1 day ago.  Right hip pain.  MRI OF THE RIGHT HIP WITHOUT CONTRAST  Technique:  Multiplanar, multisequence MR imaging was performed. No intravenous contrast was administered.  Comparison: Plain films 01/31/2013.  Findings: The patient has an acute subcapital fracture of the right hip.  Associated marrow edema and small joint effusion are noted. No other fracture is identified.  There is only mild degenerative change about the hips.  Also seen is a T2 hyperintense collection in the deep subcutaneous fat of the left buttock measuring 1.8 cm transverse by 3.2 cm AP by 2.3 cm cranial-caudal compatible with an old hematoma or less likely fat necrosis.  Sacroiliac joints and symphysis pubis are unremarkable. Imaged intrapelvic demonstrated a distended urinary bladder.  IMPRESSION:  1.  Study is positive for an acute subcapital fracture of the right hip. 2.  Likely old hematoma left buttock. 3.  Distend urinary bladder.  Findings were called to Dr. Preston Fleeting at the time of  interpretation.   Original Report Authenticated By: Holley Dexter, M.D.    Microbiology: Recent Results (from the past 240 hour(s))  URINE CULTURE     Status: None   Collection Time    01/31/13  5:45 PM      Result Value Range Status   Specimen Description URINE, CATHETERIZED   Final   Special Requests NONE   Final   Culture  Setup Time 02/01/2013 01:33   Final   Colony Count NO GROWTH   Final   Culture NO GROWTH   Final   Report Status 02/02/2013 FINAL   Final  SURGICAL PCR SCREEN     Status: Abnormal   Collection Time    01/31/13  9:20 PM      Result Value Range Status   MRSA, PCR NEGATIVE  NEGATIVE Final   Staphylococcus aureus POSITIVE (*) NEGATIVE Final   Comment:            The Xpert SA Assay (FDA     approved for NASAL specimens     in patients over 57 years of age),     is one component of     a comprehensive surveillance     program.  Test performance has     been validated by [employer] for patients greater     than or equal to 86 year old.     It is not intended     to diagnose infection nor to     guide or monitor treatment.     RESULT CALLED TO, READ BACK BY AND VERIFIED WITH:     M. SIMPSON AT 2314 ON 01/31/13 BY Wynonia Lawman     Labs: Basic Metabolic Panel:  Recent Labs Lab 01/31/13 1630 02/02/13 0850  NA 137 134*  K 4.4 4.0  CL 100 100  CO2 28 28  GLUCOSE 120* 109*  BUN 13 14  CREATININE 0.71 0.84  CALCIUM 9.4 8.4   Liver Function Tests:  Recent Labs Lab 01/31/13 1630  AST 23  ALT 25  ALKPHOS 97  BILITOT 0.8  PROT 7.8  ALBUMIN 3.3*   CBC:  Recent Labs Lab 01/31/13 1630 02/02/13 0850 02/03/13 0601  WBC 15.3* 6.4 5.6  NEUTROABS 13.2* 4.8 3.2  HGB 10.9* 10.0* 9.3*  HCT 34.1* 31.8* 29.5*  MCV 69.9* 70.0* 70.1*  PLT 323 201 215    Principal Problem:   Closed right hip fracture Active Problems:   Fall   Dementia with behavioral disturbance   Parkinson disease   Time coordinating discharge: 25  minutes  Signed:  Brendia Sacks, MD [facility] 02/03/2013, 10:29 AM

## 2013-02-03 NOTE — Clinical Social Work Placement (Signed)
Clinical Social Work Department CLINICAL SOCIAL WORK PLACEMENT NOTE 02/03/2013  Patient:  KAZIAH, KRIZEK  Account Number:  [id number] Admit date:  01/31/2013  Clinical Social Worker:  [name], LCSW  Date/time:  02/03/2013 11:05 AM  Clinical Social Work is seeking post-discharge placement for this patient at the following level of care:   SKILLED NURSING   (*CSW will update this form in Epic as items are completed)   02/03/2013  Patient/family provided with [facility] Department of Clinical Social Work's list of facilities offering this level of care within the geographic area requested by the patient (or if unable, by the patient's family).  02/03/2013  Patient/family informed of their freedom to choose among providers that offer the needed level of care, that participate in Medicare, Medicaid or managed care program needed by the patient, have an available bed and are willing to accept the patient.  02/03/2013  Patient/family informed of [facility]' ownership interest in [facility], as well as of the fact that they are under no obligation to receive care at this facility.  PASARR submitted to EDS on 02/03/2013 PASARR number received from EDS on 02/03/2013  FL2 transmitted to all facilities in geographic area requested by pt/family on  02/03/2013 FL2 transmitted to all facilities within larger geographic area on   Patient informed that his/her managed care company has contracts with or will negotiate with  certain facilities, including the following:     Patient/family informed of bed offers received:   Patient chooses bed at  Physician recommends and patient chooses bed at    Patient to be transferred to  on   Patient to be transferred to facility by   The following physician request were entered in Epic:   Additional Comments:  [name], LCSW [phone]

## 2013-02-03 NOTE — Progress Notes (Signed)
Accompanied Dr. Irene Limbo in the room while he discussed the patients family concerns. Pts POA states that she is now happy with her mother being d/c'd to [facility].  She states that is comfortable due to there being a familiar face at the cite.    Discussed with Dr. Irene Limbo that I would just hold the patient until the am since I do not know that she is ready from the Social worker standpoint.  He stated it was okay.

## 2013-02-04 DIAGNOSIS — F0390 Unspecified dementia without behavioral disturbance: Secondary | ICD-10-CM

## 2013-02-04 LAB — CBC WITH DIFFERENTIAL/PLATELET
Basophils Absolute: 0 10*3/uL (ref 0.0–0.1)
Basophils Relative: 0 % (ref 0–1)
Eosinophils Absolute: 0 10*3/uL (ref 0.0–0.7)
Eosinophils Relative: 1 % (ref 0–5)
HCT: 29.1 % — ABNORMAL LOW (ref 36.0–46.0)
Hemoglobin: 9.1 g/dL — ABNORMAL LOW (ref 12.0–15.0)
Lymphocytes Relative: 13 % (ref 12–46)
Lymphs Abs: 0.7 10*3/uL (ref 0.7–4.0)
MCH: 21.7 pg — ABNORMAL LOW (ref 26.0–34.0)
MCHC: 31.3 g/dL (ref 30.0–36.0)
MCV: 69.3 fL — ABNORMAL LOW (ref 78.0–100.0)
Monocytes Absolute: 1.1 10*3/uL — ABNORMAL HIGH (ref 0.1–1.0)
Monocytes Relative: 20 % — ABNORMAL HIGH (ref 3–12)
Neutro Abs: 3.5 10*3/uL (ref 1.7–7.7)
Neutrophils Relative %: 66 % (ref 43–77)
Platelets: 243 10*3/uL (ref 150–400)
RBC: 4.2 MIL/uL (ref 3.87–5.11)
RDW: 16.8 % — ABNORMAL HIGH (ref 11.5–15.5)
WBC: 5.4 10*3/uL (ref 4.0–10.5)

## 2013-02-04 NOTE — Clinical Social Work Note (Signed)
Pt's daughter toured [name] and is agreeable to transfer there. No changes to d/c summary per MD. Pt to transfer via [facility]. D/C summary faxed.  [name], [place] [phone]

## 2013-02-04 NOTE — Clinical Social Work Placement (Signed)
Clinical Social Work Department CLINICAL SOCIAL WORK PLACEMENT NOTE 02/04/2013  Patient:  [name], [name]  Account Number:  [id number] Admit date:  01/31/2013  Clinical Social Worker:  [name], LCSW  Date/time:  02/03/2013 11:05 AM  Clinical Social Work is seeking post-discharge placement for this patient at the following level of care:   SKILLED NURSING   (*CSW will update this form in Epic as items are completed)   02/03/2013  Patient/family provided with [facility] Department of Clinical Social Work's list of facilities offering this level of care within the geographic area requested by the patient (or if unable, by the patient's family).  02/03/2013  Patient/family informed of their freedom to choose among providers that offer the needed level of care, that participate in Medicare, Medicaid or managed care program needed by the patient, have an available bed and are willing to accept the patient.  02/03/2013  Patient/family informed of [facility]' ownership interest in [facility], as well as of the fact that they are under no obligation to receive care at this facility.  PASARR submitted to EDS on 02/03/2013 PASARR number received from EDS on 02/03/2013  FL2 transmitted to all facilities in geographic area requested by pt/family on  02/03/2013 FL2 transmitted to all facilities within larger geographic area on   Patient informed that his/her managed care company has contracts with or will negotiate with  certain facilities, including the following:     Patient/family informed of bed offers received:  02/04/2013 Patient chooses bed at [facility] Physician recommends and patient chooses bed at    Patient to be transferred to [facility] on  02/04/2013 Patient to be transferred to facility by [facility]  The following physician request were entered in Epic:   Additional Comments:  [name], LCSW [phone]

## 2013-02-04 NOTE — Progress Notes (Signed)
Pt d/c to [facility] via EMS. Report called to Creta Levin, RN at [facility]. IV d/c. Pts family is aware of transport and is meeting pt at the facility.

## 2013-02-04 NOTE — Progress Notes (Addendum)
[facility] PROGRESS NOTE  [name] ZOX:[id number] DOB: 05-27-1920 DOA: 01/31/2013 PCP: Evlyn Courier, MD  Assessment/Plan: 1. Right hip fracture status post mechanical fall 2. Recurrent falls secondary to dementia, Parkinson's and wondering behavior 3. Alzheimer's dementia, stable 4. Parkinson's disease 5. Minimal skin breakdown buttocks   Remained stable for transfer to skilled nursing facility for rehabilitation  Code Status: DO NOT RESUSCITATE DVT prophylaxis: Lovenox Family Communication: Discussed with daughter/healthcare power of attorney at bedside 5/20. She concurs with discharge. Disposition Plan: Skilled nursing facility  Brendia Sacks, MD  [facility]  Pager [phone] If 7PM-7AM, please contact night-coverage at [email or web address], password [facility] 02/04/2013, 9:18 AM  LOS: 4 days   HPI/Subjective: No issues overnight. Daughter bedside notes the patient is eating well. 2 small areas of skin breakdown on her buttocks. No other issues. No new issues per nursing. Afebrile with low-grade temperature 100.2. Vitals stable. No hypoxia.  Objective: Filed Vitals:   02/04/13 0222 02/04/13 0510 02/04/13 0853 02/04/13 0904  BP:  106/68  120/67  Pulse:  92  74  Temp:  99.9 F (37.7 C)    TempSrc:  Oral    Resp:  20    Height:      Weight:      SpO2: 94% 96% 98%     Intake/Output Summary (Last 24 hours) at 02/04/13 0918 Last data filed at 02/04/13 0650  Gross per 24 hour  Intake    600 ml  Output      0 ml  Net    600 ml     Filed Weights   01/31/13 1203 01/31/13 1941  Weight: 56.246 kg (124 lb) 57.4 kg (126 lb 8.7 oz)    Exam:  General: Appears calm and comfortable. Arouses to voice. Cardiovascular: RRR, no m/r/g. No LE edema. Respiratory: CTA bilaterally, no w/r/r. Normal respiratory effort. Back: Buttocks examined. 2 very small areas of skin breakdown, pea-sized with the appearance of minor abrasions. Skin: As above Musculoskeletal: grossly  normal tone BUE/BLE Psychiatric: Difficult to assess mood or affect but appears calm and unchanged.  Data Reviewed:  Hemoglobin stable at 9.1  Studies:  None last 24 hours  Scheduled Meds: . acetaminophen  500 mg Oral QHS  . albuterol  2.5 mg Nebulization Q6H  . ALPRAZolam  0.5 mg Oral TID  . aspirin EC  81 mg Oral q morning - 10a  . benztropine  1 mg Oral Q1400  . bumetanide  0.5 mg Oral Daily  . Chlorhexidine Gluconate Cloth  6 each Topical Daily  . citalopram  20 mg Oral q morning - 10a  . enoxaparin (LOVENOX) injection  40 mg Subcutaneous Q24H  . metoprolol  25 mg Oral q morning - 10a  . mupirocin ointment  1 application Nasal BID  . rivastigmine  9.5 mg Transdermal Daily  . zolpidem  5 mg Oral QHS   Continuous Infusions:   Principal Problem:   Closed right hip fracture Active Problems:   Fall   Dementia with behavioral disturbance   Parkinson disease   Time spent 15 minutes

## 2013-02-04 NOTE — Plan of Care (Signed)
Problem: Phase III Progression Outcomes Goal: Voiding independently Outcome: Not Progressing At baseline.

## 2013-02-04 NOTE — Progress Notes (Signed)
Subjective: 3 Days Post-Op Procedure(s) (LRB): CANNULATED HIP PINNING (Right) Patient reports pain as 2 on 0-10 scale.    Objective: Vital signs in last 24 hours: Temp:  [98.1 F (36.7 C)-100.2 F (37.9 C)] 99.9 F (37.7 C) (05/20 0510) Pulse Rate:  [72-98] 74 (05/20 0904) Resp:  [18-20] 20 (05/20 0510) BP: (106-120)/(67-73) 120/67 mmHg (05/20 0904) SpO2:  [94 %-98 %] 98 % (05/20 0853) FiO2 (%):  [21 %] 21 % (05/20 0853)  Intake/Output from previous day: 05/19 0701 - 05/20 0700 In: 720 [P.O.:720] Out: -  Intake/Output this shift:     Recent Labs  02/02/13 0850 02/03/13 0601 02/04/13 0544  HGB 10.0* 9.3* 9.1*    Recent Labs  02/03/13 0601 02/04/13 0544  WBC 5.6 5.4  RBC 4.21 4.20  HCT 29.5* 29.1*  PLT 215 243    Recent Labs  02/02/13 0850  NA 134*  K 4.0  CL 100  CO2 28  BUN 14  CREATININE 0.84  GLUCOSE 109*  CALCIUM 8.4   No results found for this basename: LABPT, INR,  in the last 72 hours  Neurovascular intact Sensation intact distally Intact pulses distally Dorsiflexion/Plantar flexion intact Incision: no drainage  Assessment/Plan: 3 Days Post-Op Procedure(s) (LRB): CANNULATED HIP PINNING (Right) Discharge to SNF  [facility] 02/04/2013, 9:36 AM

## 2013-02-04 NOTE — Plan of Care (Signed)
Problem: Phase II Progression Outcomes Goal: Sutures/staples intact Outcome: Not Applicable Date Met:  02/04/13 Unable to view due to dressing.

## 2013-04-04 ENCOUNTER — Encounter (HOSPITAL_COMMUNITY): Payer: Self-pay | Admitting: Emergency Medicine

## 2013-04-04 ENCOUNTER — Emergency Department (HOSPITAL_COMMUNITY): Payer: PRIVATE HEALTH INSURANCE

## 2013-04-04 ENCOUNTER — Observation Stay (HOSPITAL_COMMUNITY): Payer: PRIVATE HEALTH INSURANCE

## 2013-04-04 ENCOUNTER — Inpatient Hospital Stay (HOSPITAL_COMMUNITY)
Admission: EM | Admit: 2013-04-04 | Discharge: 2013-04-05 | DRG: 640 | Disposition: A | Discharge status: Home Health | Payer: PRIVATE HEALTH INSURANCE | Attending: Internal Medicine | Admitting: Internal Medicine

## 2013-04-04 DIAGNOSIS — F03918 Unspecified dementia, unspecified severity, with other behavioral disturbance: Secondary | ICD-10-CM

## 2013-04-04 DIAGNOSIS — I10 Essential (primary) hypertension: Secondary | ICD-10-CM | POA: Diagnosis present

## 2013-04-04 DIAGNOSIS — R4182 Altered mental status, unspecified: Secondary | ICD-10-CM

## 2013-04-04 DIAGNOSIS — G20 Parkinson's disease: Secondary | ICD-10-CM | POA: Diagnosis present

## 2013-04-04 DIAGNOSIS — F0391 Unspecified dementia with behavioral disturbance: Secondary | ICD-10-CM

## 2013-04-04 DIAGNOSIS — Z880 Allergy status to penicillin: Secondary | ICD-10-CM

## 2013-04-04 DIAGNOSIS — Z66 Do not resuscitate: Secondary | ICD-10-CM | POA: Diagnosis present

## 2013-04-04 DIAGNOSIS — F0281 Dementia in other diseases classified elsewhere with behavioral disturbance: Secondary | ICD-10-CM | POA: Diagnosis present

## 2013-04-04 DIAGNOSIS — N390 Urinary tract infection, site not specified: Secondary | ICD-10-CM

## 2013-04-04 DIAGNOSIS — F02818 Dementia in other diseases classified elsewhere, unspecified severity, with other behavioral disturbance: Secondary | ICD-10-CM | POA: Diagnosis present

## 2013-04-04 DIAGNOSIS — G9341 Metabolic encephalopathy: Secondary | ICD-10-CM | POA: Diagnosis present

## 2013-04-04 DIAGNOSIS — R7989 Other specified abnormal findings of blood chemistry: Secondary | ICD-10-CM

## 2013-04-04 DIAGNOSIS — D509 Iron deficiency anemia, unspecified: Secondary | ICD-10-CM | POA: Diagnosis present

## 2013-04-04 DIAGNOSIS — R531 Weakness: Secondary | ICD-10-CM

## 2013-04-04 DIAGNOSIS — G309 Alzheimer's disease, unspecified: Secondary | ICD-10-CM | POA: Diagnosis present

## 2013-04-04 DIAGNOSIS — G20A1 Parkinson's disease without dyskinesia, without mention of fluctuations: Secondary | ICD-10-CM | POA: Diagnosis present

## 2013-04-04 DIAGNOSIS — R5381 Other malaise: Secondary | ICD-10-CM

## 2013-04-04 DIAGNOSIS — R5383 Other fatigue: Secondary | ICD-10-CM

## 2013-04-04 DIAGNOSIS — R778 Other specified abnormalities of plasma proteins: Secondary | ICD-10-CM

## 2013-04-04 DIAGNOSIS — E860 Dehydration: Principal | ICD-10-CM

## 2013-04-04 DIAGNOSIS — R748 Abnormal levels of other serum enzymes: Secondary | ICD-10-CM | POA: Diagnosis present

## 2013-04-04 DIAGNOSIS — F0280 Dementia in other diseases classified elsewhere without behavioral disturbance: Secondary | ICD-10-CM | POA: Diagnosis present

## 2013-04-04 LAB — COMPREHENSIVE METABOLIC PANEL
ALT: 10 U/L (ref 0–35)
AST: 17 U/L (ref 0–37)
Albumin: 3.4 g/dL — ABNORMAL LOW (ref 3.5–5.2)
Alkaline Phosphatase: 93 U/L (ref 39–117)
BUN: 16 mg/dL (ref 6–23)
CO2: 30 mEq/L (ref 19–32)
Calcium: 9.8 mg/dL (ref 8.4–10.5)
Chloride: 99 mEq/L (ref 96–112)
Creatinine, Ser: 0.78 mg/dL (ref 0.50–1.10)
GFR calc Af Amer: 82 mL/min — ABNORMAL LOW (ref >90)
GFR calc non Af Amer: 71 mL/min — ABNORMAL LOW (ref >90)
Glucose, Bld: 109 mg/dL — ABNORMAL HIGH (ref 70–99)
Potassium: 4.4 mEq/L (ref 3.5–5.1)
Sodium: 135 mEq/L (ref 135–145)
Total Bilirubin: 0.5 mg/dL (ref 0.3–1.2)
Total Protein: 8.7 g/dL — ABNORMAL HIGH (ref 6.0–8.3)

## 2013-04-04 LAB — URINALYSIS, ROUTINE W REFLEX MICROSCOPIC
Bilirubin Urine: NEGATIVE
Glucose, UA: NEGATIVE mg/dL
Ketones, ur: NEGATIVE mg/dL
Nitrite: NEGATIVE
Protein, ur: NEGATIVE mg/dL
Specific Gravity, Urine: 1.01 (ref 1.005–1.030)
Urobilinogen, UA: 0.2 mg/dL (ref 0.0–1.0)
pH: 7 (ref 5.0–8.0)

## 2013-04-04 LAB — CBC WITH DIFFERENTIAL/PLATELET
Basophils Absolute: 0 10*3/uL (ref 0.0–0.1)
Basophils Relative: 0 % (ref 0–1)
Eosinophils Absolute: 0 10*3/uL (ref 0.0–0.7)
Eosinophils Relative: 0 % (ref 0–5)
HCT: 33.6 % — ABNORMAL LOW (ref 36.0–46.0)
Hemoglobin: 10.4 g/dL — ABNORMAL LOW (ref 12.0–15.0)
Lymphocytes Relative: 9 % — ABNORMAL LOW (ref 12–46)
Lymphs Abs: 1.1 10*3/uL (ref 0.7–4.0)
MCH: 21.1 pg — ABNORMAL LOW (ref 26.0–34.0)
MCHC: 31 g/dL (ref 30.0–36.0)
MCV: 68.2 fL — ABNORMAL LOW (ref 78.0–100.0)
Monocytes Absolute: 0.7 10*3/uL (ref 0.1–1.0)
Monocytes Relative: 6 % (ref 3–12)
Neutro Abs: 9.4 10*3/uL — ABNORMAL HIGH (ref 1.7–7.7)
Neutrophils Relative %: 84 % — ABNORMAL HIGH (ref 43–77)
Platelets: 384 10*3/uL (ref 150–400)
RBC: 4.93 MIL/uL (ref 3.87–5.11)
RDW: 17.3 % — ABNORMAL HIGH (ref 11.5–15.5)
WBC: 11.2 10*3/uL — ABNORMAL HIGH (ref 4.0–10.5)

## 2013-04-04 LAB — URINE MICROSCOPIC-ADD ON

## 2013-04-04 LAB — TROPONIN I
Troponin I: 0.3 ng/mL (ref <0.30)
Troponin I: <0.3 ng/mL (ref <0.30)

## 2013-04-04 MED ORDER — CIPROFLOXACIN IN D5W 400 MG/200ML IV SOLN
400.0000 mg | Freq: Two times a day (BID) | INTRAVENOUS | Status: DC
  Administered 2013-04-04 – 2013-04-05 (×2): 400 mg via INTRAVENOUS
  Filled 2013-04-04 (×6): qty 200

## 2013-04-04 MED ORDER — METOPROLOL TARTRATE 25 MG PO TABS
25.0000 mg | ORAL_TABLET | Freq: Every day | ORAL | Status: DC
  Filled 2013-04-04: qty 1

## 2013-04-04 MED ORDER — ALPRAZOLAM 0.5 MG PO TABS
0.5000 mg | ORAL_TABLET | Freq: Three times a day (TID) | ORAL | Status: DC
  Administered 2013-04-04 – 2013-04-05 (×3): 0.5 mg via ORAL
  Filled 2013-04-04 (×4): qty 1

## 2013-04-04 MED ORDER — ACETAMINOPHEN 325 MG PO TABS: 650.0000 mg | ORAL_TABLET | Freq: Four times a day (QID) | ORAL | Status: DC | PRN

## 2013-04-04 MED ORDER — SODIUM CHLORIDE 0.9 % IJ SOLN: 3.0000 mL | Freq: Two times a day (BID) | INTRAMUSCULAR | Status: DC

## 2013-04-04 MED ORDER — PANTOPRAZOLE SODIUM 40 MG PO TBEC
40.0000 mg | DELAYED_RELEASE_TABLET | Freq: Every day | ORAL | Status: DC
  Filled 2013-04-04: qty 1

## 2013-04-04 MED ORDER — LORAZEPAM 2 MG/ML IJ SOLN
0.2500 mg | Freq: Once | INTRAMUSCULAR | Status: AC
  Administered 2013-04-04: 0.25 mg via INTRAVENOUS
  Filled 2013-04-04: qty 1

## 2013-04-04 MED ORDER — FERROUS SULFATE 325 (65 FE) MG PO TABS
325.0000 mg | ORAL_TABLET | Freq: Two times a day (BID) | ORAL | Status: DC
  Administered 2013-04-04 – 2013-04-05 (×2): 325 mg via ORAL
  Filled 2013-04-04 (×3): qty 1

## 2013-04-04 MED ORDER — ENOXAPARIN SODIUM 40 MG/0.4ML ~~LOC~~ SOLN
40.0000 mg | SUBCUTANEOUS | Status: DC
  Administered 2013-04-04: 40 mg via SUBCUTANEOUS
  Filled 2013-04-04: qty 0.4

## 2013-04-04 MED ORDER — CITALOPRAM HYDROBROMIDE 20 MG PO TABS
20.0000 mg | ORAL_TABLET | Freq: Every morning | ORAL | Status: DC
  Administered 2013-04-05: 20 mg via ORAL
  Filled 2013-04-04 (×2): qty 1

## 2013-04-04 MED ORDER — SODIUM CHLORIDE 0.9 % IV SOLN
INTRAVENOUS | Status: DC
  Administered 2013-04-04: 19:00:00 via INTRAVENOUS

## 2013-04-04 MED ORDER — DEXTROSE 5 % IV SOLN
1.0000 g | Freq: Once | INTRAVENOUS | Status: AC
  Administered 2013-04-04: 1 g via INTRAVENOUS
  Filled 2013-04-04: qty 10

## 2013-04-04 MED ORDER — ASPIRIN EC 325 MG PO TBEC
325.0000 mg | DELAYED_RELEASE_TABLET | Freq: Every day | ORAL | Status: DC
  Filled 2013-04-04: qty 1

## 2013-04-04 MED ORDER — SODIUM CHLORIDE 0.9 % IV BOLUS (SEPSIS)
500.0000 mL | Freq: Once | INTRAVENOUS | Status: AC
  Administered 2013-04-04: 500 mL via INTRAVENOUS

## 2013-04-04 MED ORDER — HYDROCODONE-ACETAMINOPHEN 5-325 MG PO TABS
1.0000 | ORAL_TABLET | Freq: Three times a day (TID) | ORAL | Status: DC
  Administered 2013-04-04 – 2013-04-05 (×3): 1 via ORAL
  Filled 2013-04-04 (×4): qty 1

## 2013-04-04 MED ORDER — RIVASTIGMINE 9.5 MG/24HR TD PT24
9.5000 mg | MEDICATED_PATCH | Freq: Every day | TRANSDERMAL | Status: DC
  Administered 2013-04-05: 9.5 mg via TRANSDERMAL
  Filled 2013-04-04 (×3): qty 1

## 2013-04-04 MED ORDER — ALPRAZOLAM 0.5 MG PO TABS: 0.5000 mg | ORAL_TABLET | ORAL | Status: DC | PRN

## 2013-04-04 MED ORDER — BENZTROPINE MESYLATE 1 MG PO TABS
1.0000 mg | ORAL_TABLET | Freq: Every day | ORAL | Status: DC
  Administered 2013-04-05: 1 mg via ORAL
  Filled 2013-04-04 (×2): qty 1

## 2013-04-04 MED ORDER — HYDROCODONE-ACETAMINOPHEN 5-325 MG PO TABS: 0.5000 | ORAL_TABLET | Freq: Four times a day (QID) | ORAL | Status: DC | PRN

## 2013-04-04 MED ORDER — CIPROFLOXACIN IN D5W 400 MG/200ML IV SOLN
INTRAVENOUS | Status: AC
  Filled 2013-04-04: qty 200

## 2013-04-04 MED ORDER — ACETAMINOPHEN 650 MG RE SUPP: 650.0000 mg | Freq: Four times a day (QID) | RECTAL | Status: DC | PRN

## 2013-04-04 MED ORDER — ONDANSETRON HCL 4 MG PO TABS: 4.0000 mg | ORAL_TABLET | Freq: Four times a day (QID) | ORAL | Status: DC | PRN

## 2013-04-04 MED ORDER — ONDANSETRON HCL 4 MG/2ML IJ SOLN: 4.0000 mg | Freq: Four times a day (QID) | INTRAMUSCULAR | Status: DC | PRN

## 2013-04-04 NOTE — ED Provider Notes (Addendum)
History  This chart was scribed for Samantha Hutching, MD, by Yevette Edwards, ED Scribe. This patient was seen in room APA04/APA04 and the patient's care was started at 10:56 AM.  CSN: [id number] Arrival date & time 04/04/13  1033  First MD Initiated Contact with Patient 04/04/13 1055     Chief Complaint  Patient presents with  . Fatigue   LEVEL 5 CAVEAT (Unresponsive)   The history is provided by a relative and medical records. The history is limited by the condition of the patient. No language interpreter was used.   HPI Comments: [name] is a 79 y.o. female with a h/o of Alzheimer's dementia who presents to the Emergency Department complaining of sudden-onset fatigue. The daughter states that her mother was non-responsive this morning when the daughter visited the pt at [facility]. According to the nursing staff at [facility], the pt was restless last night and was administered an Ambien about 3:00 am this morning.The daughter reports that the pt's baseline is usually alert and active. The daughter states that her mother is currently using a walker and is in rehab for a hip surgery which occurred two months ago. The pt is a non-smoker and does not use alcohol.   Dr. Mirna Mires is her PCP  Past Medical History  Diagnosis Date  . Hypertension   . Heart attack   . Parkinson disease   . Cancer     breast  . Alzheimer's dementia    Past Surgical History  Procedure Laterality Date  . Breast surgery    . Abdominal hysterectomy    . Mastectomy    . Small intestine removed    . Tonsillectomy    . Cholecystectomy    . Colon surgery    . Pelvic fracture surgery    . Leg surgery    . Arm surgery    . Hip pinning,cannulated Right 02/01/2013    Procedure: CANNULATED HIP PINNING;  Surgeon: Darreld Mclean, MD;  Location: [facility];  Service: Orthopedics;  Laterality: Right;   Family History  Problem Relation Age of Onset  . Aneurysm Mother   . Diabetes Father    History   Substance Use Topics  . Smoking status: Never Smoker   . Smokeless tobacco: Not on file  . Alcohol Use: No   No OB history provided.  Review of Systems  Unable to perform ROS: Patient unresponsive    Allergies  Penicillins  Home Medications   Current Outpatient Rx  Name  Route  Sig  Dispense  Refill  . acetaminophen (TYLENOL) 500 MG tablet   Oral   Take 500 mg by mouth at bedtime. For pain         . ALPRAZolam (XANAX) 0.5 MG tablet   Oral   Take 1 tablet (0.5 mg total) by mouth 3 (three) times daily as needed for anxiety. For anxiety   10 tablet   0   . aspirin EC 81 MG tablet   Oral   Take 81 mg by mouth every morning.          . benztropine (COGENTIN) 1 MG tablet   Oral   Take 1 mg by mouth daily. At 1400 (2pm)         . bumetanide (BUMEX) 0.5 MG tablet   Oral   Take 0.5 mg by mouth daily.         . citalopram (CELEXA) 20 MG tablet   Oral   Take 20 mg  by mouth every morning.          . diclofenac sodium (VOLTAREN) 1 % GEL   Topical   Apply 2 g topically 2 (two) times daily as needed (arm pain). For arm pain         . enoxaparin (LOVENOX) 40 MG/0.4ML injection   Subcutaneous   Inject 0.4 mLs (40 mg total) into the skin daily. Last dose June 17.   0 Syringe      . HYDROcodone-acetaminophen (NORCO/VICODIN) 5-325 MG per tablet   Oral   Take 1 tablet by mouth every 6 (six) hours as needed (severe pain).   10 tablet   0   . metoprolol (LOPRESSOR) 50 MG tablet   Oral   Take 25 mg by mouth every morning.          . rivastigmine (EXELON) 9.5 mg/24hr   Transdermal   Place 1 patch onto the skin daily. *Remove and discard used patches*         . zolpidem (AMBIEN) 10 MG tablet   Oral   Take 0.5 tablets (5 mg total) by mouth at bedtime.          Triage Vitals: BP 153/79  Pulse 60  Temp(Src) 98.3 F (36.8 C) (Rectal)  Wt 125 lb (56.7 kg)  BMI 21.45 kg/m2  SpO2 91%  Physical Exam  Nursing note and vitals  reviewed. Constitutional:  Demented, does not answer questions.   HENT:  Head: Normocephalic and atraumatic.  Eyes: Conjunctivae and EOM are normal. Pupils are equal, round, and reactive to light.  Neck: Normal range of motion. Neck supple.  Cardiovascular: Normal rate, regular rhythm and normal heart sounds.   Pulmonary/Chest: Effort normal and breath sounds normal.  Abdominal: Soft. Bowel sounds are normal.  Musculoskeletal: Normal range of motion.  Unable to perform.  Neurological:  Unable to perform.    Skin: Skin is warm and dry.  Psychiatric:  Opened eyes to verbal commands, but did not follow commands.    ED Course  Procedures (including critical care time)   COORDINATION OF CARE:  11:12 AM-Discussed treatment plan with patient's daughters which include blood work, a urine analysis,  Imaging, and possible admission. The pt's daughters agreed to the plan.   DIAGNOSTIC STUDIES:  Oxygen Saturation is 91% on room air, low by my interpretation.    Labs Reviewed  URINALYSIS, ROUTINE W REFLEX MICROSCOPIC - Abnormal; Notable for the following:    APPearance CLOUDY (*)    Hgb urine dipstick LARGE (*)    Leukocytes, UA MODERATE (*)    All other components within normal limits  URINE MICROSCOPIC-ADD ON - Abnormal; Notable for the following:    Squamous Epithelial / LPF MANY (*)    Bacteria, UA FEW (*)    All other components within normal limits  COMPREHENSIVE METABOLIC PANEL - Abnormal; Notable for the following:    Glucose, Bld 109 (*)    Total Protein 8.7 (*)    Albumin 3.4 (*)    GFR calc non Af Amer 71 (*)    GFR calc Af Amer 82 (*)    All other components within normal limits  CBC WITH DIFFERENTIAL - Abnormal; Notable for the following:    WBC 11.2 (*)    Hemoglobin 10.4 (*)    HCT 33.6 (*)    MCV 68.2 (*)    MCH 21.1 (*)    RDW 17.3 (*)    Neutrophils Relative % 84 (*)    Neutro Abs  9.4 (*)    Lymphocytes Relative 9 (*)    All other components within  normal limits  TROPONIN I - Abnormal; Notable for the following:    Troponin I 0.30 (*)    All other components within normal limits  URINE CULTURE      Date: 04/04/2013  Rate: 60  Rhythm: normal sinus rhythm  QRS Axis: normal  Intervals: normal  ST/T Wave abnormalities: normal  Conduction Disutrbances: none  Narrative Interpretation: unremarkable PVC   Ct Head Wo Contrast  04/04/2013   *RADIOLOGY REPORT*  Clinical Data: Fatigue, Alzheimer's dementia  CT HEAD WITHOUT CONTRAST  Technique:  Contiguous axial images were obtained from the base of the skull through the vertex without contrast.  Comparison: 01/31/2013  Findings: Motion degraded images.  No evidence of parenchymal hemorrhage or extra-axial fluid collection.  No mass effect or midline shift.  No CT evidence of acute infarction.  Small vessel ischemic changes.  Mild intracranial atherosclerosis.  Global cortical atrophy.  No ventriculomegaly.  Visualized paranasal sinuses and mastoid air cells are essentially clear.  No evidence of calvarial fracture.  IMPRESSION: Motion degraded images.  No evidence of acute intranial abnormality.  Atrophy with small vessel ischemic changes.   Original Report Authenticated By: Charline Bills, M.D.   Dg Chest Portable 1 View  04/04/2013   *RADIOLOGY REPORT*  Clinical Data: 60  PORTABLE CHEST - 1 VIEW  Comparison: 07/12/2012  Findings: Low volume film. Interstitial markings are diffusely coarsened with chronic features.  There is some basilar atelectasis.  No edema or focal airspace consolidation. The cardiopericardial silhouette is enlarged. Bones are diffusely demineralized.  IMPRESSION: Cardiomegaly with chronic interstitial coarsening and basilar atelectasis or scarring.   Original Report Authenticated By: Kennith Center, M.D.   No diagnosis found.     Date: 04/04/2013  Rate: 69  Rhythm: normal sinus rhythm  QRS Axis: normal  Intervals: normal  ST/T Wave abnormalities: normal  Conduction  Disutrbances: none  Narrative Interpretation: unremarkable PVC   MDM  Family reports patient is not at her baseline. Minor evidence of a urinary tract infection. IV Rocephin 1 g administered. Troponin 0.30. EKG normal. Admit for observation  I personally performed the services described in this documentation, which was scribed in my presence. The recorded information has been reviewed and is accurate.    Samantha Hutching, MD 04/04/13 1536  Samantha Hutching, MD 04/08/13 1610  Samantha Hutching, MD 04/08/13 9604  Samantha Hutching, MD 04/08/13 5409  Samantha Hutching, MD 04/08/13 0157  Samantha Hutching, MD 04/08/13 0200  Samantha Hutching, MD 04/09/13 0025  Samantha Hutching, MD 05/17/13 1800  Samantha Hutching, MD 05/27/13 [phone]

## 2013-04-04 NOTE — ED Notes (Signed)
CRITICAL VALUE ALERT  Critical value received:  Troponin  Date of notification:  04/04/13  Time of notification:  1325  Critical value read back:yes  Nurse who received alert:  Lake Bells, RN  MD notified (1st page):  Dr Adriana Simas at [phone]

## 2013-04-04 NOTE — H&P (Signed)
[facility] History and Physical  GAZELLE TOWE Hampton:[id number] DOB: Apr 06, 1920 DOA: 04/04/2013  Referring physician: Dr. Adriana Simas, ER physician PCP: Evlyn Courier, MD  Specialists:   Chief Complaint: Altered mental status  HPI: [name] is a 79 y.o. female who is a resident of a nursing home. She has a history of advanced Alzheimer's disease and is currently undergoing physical therapy after suffering a hip fracture. Family reports that she was relatively well. She started to ambulate with a walker. Family reports that on the day of admission, she was noted to be increasingly lethargic and less responsive. She also was noted to have slurred speech this morning which has since improved. Before arrival to the emergency room, her family noted that she was diaphoretic. Since arrival to the emergency room her mental status has improved. There is no documented fever, cough, shortness of breath. No nausea vomiting or diarrhea. No known dysuria. She was noted the emergency room where a CT scan of the head was found to be unremarkable. Chest x-ray was also negative for acute illness. Her mental status not quite returned to baseline. Troponin was found to be mildly elevated at 0.3. She was clinically dehydrated and urinalysis indicated a possible UTI. She's been referred for admission.  Review of Systems: Limited due to patient's mental status, pertinent positives as per history of present illness, otherwise negative  Past Medical History  Diagnosis Date  . Hypertension   . Heart attack   . Parkinson disease   . Cancer     breast  . Alzheimer's dementia    Past Surgical History  Procedure Laterality Date  . Breast surgery    . Abdominal hysterectomy    . Mastectomy    . Small intestine removed    . Tonsillectomy    . Cholecystectomy    . Colon surgery    . Pelvic fracture surgery    . Leg surgery    . Arm surgery    . Hip pinning,cannulated Right 02/01/2013    Procedure: CANNULATED HIP  PINNING;  Surgeon: Darreld Mclean, MD;  Location: [facility];  Service: Orthopedics;  Laterality: Right;   Social History:  reports that she has never smoked. She does not have any smokeless tobacco history on file. She reports that she does not drink alcohol or use illicit drugs.   Allergies  Allergen Reactions  . Penicillins Swelling and Rash    Family History  Problem Relation Age of Onset  . Aneurysm Mother   . Diabetes Father     Prior to Admission medications   Medication Sig Start Date End Date Taking? Authorizing Provider  acetaminophen (TYLENOL) 500 MG tablet Take 500 mg by mouth at bedtime. For pain   Yes Historical Provider, MD  ALPRAZolam Prudy Feeler) 0.5 MG tablet Take 0.5 mg by mouth every 4 (four) hours as needed for anxiety.   Yes Historical Provider, MD  ALPRAZolam Prudy Feeler) 0.5 MG tablet Take 0.5 mg by mouth 3 (three) times daily. For anxiety 02/03/13  Yes Standley Brooking, MD  benztropine (COGENTIN) 1 MG tablet Take 1 mg by mouth daily.    Yes Historical Provider, MD  bumetanide (BUMEX) 0.5 MG tablet Take 0.5 mg by mouth daily.   Yes Historical Provider, MD  citalopram (CELEXA) 20 MG tablet Take 20 mg by mouth every morning.    Yes Historical Provider, MD  ferrous sulfate 325 (65 FE) MG tablet Take 325 mg by mouth 2 (two) times daily.   Yes Historical Provider,  MD  HYDROcodone-acetaminophen (NORCO/VICODIN) 5-325 MG per tablet Take 0.5 tablets by mouth 4 (four) times daily as needed for pain (Break Through Pain).   Yes Historical Provider, MD  HYDROcodone-acetaminophen (NORCO/VICODIN) 5-325 MG per tablet Take 1 tablet by mouth 3 (three) times daily. 02/03/13  Yes Standley Brooking, MD  metoprolol tartrate (LOPRESSOR) 25 MG tablet Take 25 mg by mouth daily.   Yes Historical Provider, MD  omeprazole (PRILOSEC) 20 MG capsule Take 20 mg by mouth daily.   Yes Historical Provider, MD  rivastigmine (EXELON) 9.5 mg/24hr Place 1 patch onto the skin daily. *Remove and discard used patches*    Yes Historical Provider, MD  zolpidem (AMBIEN) 5 MG tablet Take 5 mg by mouth at bedtime as needed for sleep.   Yes Historical Provider, MD  diclofenac sodium (VOLTAREN) 1 % GEL Apply 2 g topically 2 (two) times daily as needed (arm pain). For arm pain 02/03/13   Standley Brooking, MD   Physical Exam: Filed Vitals:   04/04/13 1051 04/04/13 1408  BP: 153/79 144/99  Pulse: 60 72  Temp: 98.3 F (36.8 C)   TempSrc: Rectal   Resp:  20  Weight: 56.7 kg (125 lb)   SpO2: 91% 92%     General:  No acute distress, confused, combative  Eyes: Pupils are equal round react to light  ENT: Mucous membranes are dry  Neck: Supple  Cardiovascular: S1, S2, regular rate and rhythm  Respiratory: Clear to auscultation bilaterally  Abdomen: Soft, nontender, nondistended, positive bowel sounds  Skin: No visible rashes  Musculoskeletal: No pedal edema bilaterally  Psychiatric: Combative, agitated, confused, does not want to be touched  Neurologic: Grossly intact, nonfocal, exam is limited due to patient's mental status, she is moving all 4 extremities spontaneously, does not have any facial asymmetry.  Labs on Admission:  Basic Metabolic Panel:  Recent Labs Lab 04/04/13 1238  NA 135  K 4.4  CL 99  CO2 30  GLUCOSE 109*  BUN 16  CREATININE 0.78  CALCIUM 9.8   Liver Function Tests:  Recent Labs Lab 04/04/13 1238  AST 17  ALT 10  ALKPHOS 93  BILITOT 0.5  PROT 8.7*  ALBUMIN 3.4*   No results found for this basename: LIPASE, AMYLASE,  in the last 168 hours No results found for this basename: AMMONIA,  in the last 168 hours CBC:  Recent Labs Lab 04/04/13 1238  WBC 11.2*  NEUTROABS 9.4*  HGB 10.4*  HCT 33.6*  MCV 68.2*  PLT 384   Cardiac Enzymes:  Recent Labs Lab 04/04/13 1238  TROPONINI 0.30*    BNP (last 3 results) No results found for this basename: PROBNP,  in the last 8760 hours CBG: No results found for this basename: GLUCAP,  in the last 168  hours  Radiological Exams on Admission: Ct Head Wo Contrast  04/04/2013   *RADIOLOGY REPORT*  Clinical Data: Fatigue, Alzheimer's dementia  CT HEAD WITHOUT CONTRAST  Technique:  Contiguous axial images were obtained from the base of the skull through the vertex without contrast.  Comparison: 01/31/2013  Findings: Motion degraded images.  No evidence of parenchymal hemorrhage or extra-axial fluid collection.  No mass effect or midline shift.  No CT evidence of acute infarction.  Small vessel ischemic changes.  Mild intracranial atherosclerosis.  Global cortical atrophy.  No ventriculomegaly.  Visualized paranasal sinuses and mastoid air cells are essentially clear.  No evidence of calvarial fracture.  IMPRESSION: Motion degraded images.  No evidence of acute  intranial abnormality.  Atrophy with small vessel ischemic changes.   Original Report Authenticated By: Charline Bills, M.D.   Dg Chest Portable 1 View  04/04/2013   *RADIOLOGY REPORT*  Clinical Data: 60  PORTABLE CHEST - 1 VIEW  Comparison: 07/12/2012  Findings: Low volume film. Interstitial markings are diffusely coarsened with chronic features.  There is some basilar atelectasis.  No edema or focal airspace consolidation. The cardiopericardial silhouette is enlarged. Bones are diffusely demineralized.  IMPRESSION: Cardiomegaly with chronic interstitial coarsening and basilar atelectasis or scarring.   Original Report Authenticated By: Kennith Center, M.D.    EKG: Independently reviewed. No acute ST-T changes  Assessment/Plan Active Problems:   Dementia with behavioral disturbance   Parkinson disease   Elevated troponin   UTI (urinary tract infection)   Dehydration   Altered mental status   1. Altered mental status, likely metabolic encephalopathy superimposed on underlying dementia. This appears to be improving with IV fluids and antibiotics. With an episode of slurred speech this morning, we'll try to get an MRI of the brain although she  does not have any focal deficits at this time. Continue supportive treatments. 2. Possible urinary tract infection. Patient has allergy to penicillins and we will give ciprofloxacin. Followup on urine cultures. 3. Elevated troponin. Significance is not entirely clear. She does not appear to have many symptoms and EKG is unremarkable. We'll continue to cycle enzymes. Continue aspirin for now. This may be from a demand ischemia. 4. Dehydration. We'll give IV fluids 5. Dementia. Patient takes frequent Xanax at the nursing home to help control her agitation. This will be continued in the hospital.  Code Status: DNR Family Communication: discussed with family at the bedside Disposition Plan: return to snf if improved, hopefully in am  Time spent:  MEMON,JEHANZEB [facility] Pager [phone]  If 7PM-7AM, please contact night-coverage [email or web address] Password [facility] 04/04/2013, 5:52 PM

## 2013-04-04 NOTE — ED Notes (Signed)
MD at bedside. 

## 2013-04-04 NOTE — Progress Notes (Signed)
Pt came back to floor from MRI. Tech states that they were unable to get MRI because pt would not be still.

## 2013-04-04 NOTE — ED Notes (Signed)
Patient arrives via EMS from [facility] with c/o lethargy that started this morning. Patient alert to verbal stimuli, confused (does have dementia). Nursing staff reports patient had a restless night and was given Ambien at 0300 this morning.

## 2013-04-04 NOTE — ED Notes (Signed)
Patient agitated. Incontinent of urine. Fighting and yelling at techs who are trying to change her depends. Dr Kerry Hough called for medication for agitation. Awaiting order.

## 2013-04-04 NOTE — ED Notes (Signed)
Report given to Shanda Bumps, RN unit 300. Room changed to 329 (closer to Nurse's station) for patient safety.

## 2013-04-04 NOTE — ED Notes (Signed)
Attempted to call report. Jessica, RN to call back. 

## 2013-04-04 NOTE — ED Notes (Signed)
Family at bedside. Patient provided ginger ale and crackers per request.

## 2013-04-05 DIAGNOSIS — D509 Iron deficiency anemia, unspecified: Secondary | ICD-10-CM | POA: Diagnosis present

## 2013-04-05 LAB — BASIC METABOLIC PANEL
BUN: 13 mg/dL (ref 6–23)
CO2: 26 mEq/L (ref 19–32)
Calcium: 9.1 mg/dL (ref 8.4–10.5)
Chloride: 105 mEq/L (ref 96–112)
Creatinine, Ser: 0.7 mg/dL (ref 0.50–1.10)
GFR calc Af Amer: 85 mL/min — ABNORMAL LOW (ref >90)
GFR calc non Af Amer: 73 mL/min — ABNORMAL LOW (ref >90)
Glucose, Bld: 94 mg/dL (ref 70–99)
Potassium: 4.1 mEq/L (ref 3.5–5.1)
Sodium: 137 mEq/L (ref 135–145)

## 2013-04-05 LAB — TROPONIN I
Troponin I: <0.3 ng/mL (ref <0.30)
Troponin I: 0.3 ng/mL (ref <0.30)

## 2013-04-05 LAB — CBC
HCT: 30.9 % — ABNORMAL LOW (ref 36.0–46.0)
Hemoglobin: 9.6 g/dL — ABNORMAL LOW (ref 12.0–15.0)
MCH: 21.3 pg — ABNORMAL LOW (ref 26.0–34.0)
MCHC: 31.1 g/dL (ref 30.0–36.0)
MCV: 68.5 fL — ABNORMAL LOW (ref 78.0–100.0)
Platelets: ADEQUATE 10*3/uL (ref 150–400)
RBC: 4.51 MIL/uL (ref 3.87–5.11)
RDW: 17.5 % — ABNORMAL HIGH (ref 11.5–15.5)
WBC: 9.8 10*3/uL (ref 4.0–10.5)

## 2013-04-05 MED ORDER — PANTOPRAZOLE SODIUM 40 MG PO PACK
40.0000 mg | PACK | Freq: Every day | ORAL | Status: DC
  Administered 2013-04-05: 40 mg
  Filled 2013-04-05 (×3): qty 20

## 2013-04-05 MED ORDER — BUMETANIDE 0.5 MG PO TABS: 0.5000 mg | ORAL_TABLET | Freq: Every day | ORAL | Status: DC | PRN

## 2013-04-05 MED ORDER — ALPRAZOLAM 0.5 MG PO TABS: 0.5000 mg | ORAL_TABLET | Freq: Three times a day (TID) | ORAL | Status: DC

## 2013-04-05 MED ORDER — FERROUS SULFATE 325 (65 FE) MG PO TABS: 325.0000 mg | ORAL_TABLET | Freq: Two times a day (BID) | ORAL | Status: DC

## 2013-04-05 MED ORDER — CITALOPRAM HYDROBROMIDE 20 MG PO TABS: 20.0000 mg | ORAL_TABLET | Freq: Every morning | ORAL | Status: AC

## 2013-04-05 MED ORDER — OMEPRAZOLE 20 MG PO CPDR: 20.0000 mg | DELAYED_RELEASE_CAPSULE | Freq: Every day | ORAL | Status: DC

## 2013-04-05 MED ORDER — HYDROCODONE-ACETAMINOPHEN 5-325 MG PO TABS: 1.0000 | ORAL_TABLET | Freq: Three times a day (TID) | ORAL | Status: DC

## 2013-04-05 MED ORDER — RIVASTIGMINE 9.5 MG/24HR TD PT24: 1.0000 | MEDICATED_PATCH | Freq: Every day | TRANSDERMAL | Status: AC

## 2013-04-05 MED ORDER — ASPIRIN 325 MG PO TABS
325.0000 mg | ORAL_TABLET | Freq: Every day | ORAL | Status: DC
  Administered 2013-04-05: 325 mg via ORAL
  Filled 2013-04-05: qty 1

## 2013-04-05 MED ORDER — CIPROFLOXACIN HCL 500 MG PO TABS: 500.0000 mg | ORAL_TABLET | Freq: Two times a day (BID) | ORAL | Status: DC

## 2013-04-05 MED ORDER — METOPROLOL TARTRATE 25 MG PO TABS: 25.0000 mg | ORAL_TABLET | Freq: Every day | ORAL | Status: DC

## 2013-04-05 MED ORDER — BENZTROPINE MESYLATE 1 MG PO TABS: 1.0000 mg | ORAL_TABLET | Freq: Every day | ORAL | Status: AC

## 2013-04-05 MED ORDER — ASPIRIN EC 81 MG PO TBEC: 81.0000 mg | DELAYED_RELEASE_TABLET | Freq: Every day | ORAL | Status: DC

## 2013-04-05 NOTE — Plan of Care (Signed)
Problem: Phase I Progression Outcomes Goal: Initial discharge plan identified Outcome: Completed/Met Date Met:  04/05/13 Patient's family wants to take patient home, instead of returning to [facility].

## 2013-04-05 NOTE — Progress Notes (Signed)
Patient to be discharged home today with her family.  Patient to have HH follow her.  Patient's family states that they would like [facility].  Patient previously at [facility].  Patient's information (face sheet, face-to-face and home health order) faxed to [facility] for referral.

## 2013-04-05 NOTE — Progress Notes (Signed)
RN spoke with [facility] and provided with three contact numbers for the family.  Spoke with Britta Mccreedy at [facility].  Britta Mccreedy stated that they would be contacting the family for delivery of rolling walker and hospital bed.

## 2013-04-05 NOTE — Progress Notes (Signed)
CRITICAL VALUE ALERT  Critical value received:  Troponin 0.30  Date of notification:  04/05/2013  Time of notification:  0740  Critical value read back:yes  Nurse who received alert:  Fara Chute, RN  MD notified (1st page):  Dr. Kerry Hough  Time of first page:  [phone]  MD notified (2nd page):  Time of second page:  Responding MD:  Dr. Kerry Hough  Time MD responded:  [phone]  No new orders this time.

## 2013-04-05 NOTE — Progress Notes (Signed)
Order for hospital bed and rolling walker faxed to [facility].

## 2013-04-05 NOTE — Discharge Summary (Addendum)
Physician Discharge Summary  [name] WGN:[id number] DOB: Jun 09, 1920 DOA: 04/04/2013  PCP: Evlyn Courier, MD  Admit date: 04/04/2013 Discharge date: 04/05/2013  Time spent:  Recommendations for Outpatient Follow-up:  1. Patient's family has elected to take the patient home rather than return to SNF 2. Home health RN, PT, aide, social worker has been set up, hospital bed and rolling walker have been ordered 3. Follow up with Primary care doctor regarding further work up of microcytic anemia  Discharge Diagnoses:  Active Problems:   Dementia with behavioral disturbance   Parkinson disease   Elevated troponin   UTI (urinary tract infection)   Dehydration   Metabolic encephalopathy due to dehydration and urinary tract infection, Improved   Microcytic anemia   Discharge Condition: improved  Diet recommendation: low salt  Filed Weights   04/04/13 1051 04/04/13 1833  Weight: 56.7 kg (125 lb) 53.298 kg (117 lb 8 oz)    History of present illness:  [name] is a 79 y.o. female who is a resident of a nursing home. She has a history of advanced Alzheimer's disease and is currently undergoing physical therapy after suffering a hip fracture. Family reports that she was relatively well. She started to ambulate with a walker. Family reports that on the day of admission, she was noted to be increasingly lethargic and less responsive. She also was noted to have slurred speech this morning which has since improved. Before arrival to the emergency room, her family noted that she was diaphoretic. Since arrival to the emergency room her mental status has improved. There is no documented fever, cough, shortness of breath. No nausea vomiting or diarrhea. No known dysuria. She was noted the emergency room where a CT scan of the head was found to be unremarkable. Chest x-ray was also negative for acute illness. Her mental status not quite returned to baseline. Troponin was found to be mildly  elevated at 0.3. She was clinically dehydrated and urinalysis indicated a possible UTI. She's been referred for admission.   Hospital Course:  This lady was admitted to the hospital with worsening mental status superimposed on her baseline dementia. She is a resident of a skilled nursing facility. She was found to be unresponsive earlier in the day of admission. This gradually improved after she arrived to the emergency room. It was discovered that she had a mild urinary tract infection and was also clinically dehydrated. CT scan of the head was done which did not show any acute findings. She did not have any focal deficits. MRI of the brain was ordered, but she was unable to tolerate this/remain still for adequate images. She does not have any focal neurologic signs at this point. She was started on antibiotics and IV fluids. Her mental status has significantly improved and now is approaching baseline. She did have a very mildly elevated troponin of 0.3. This may be from a mild demand ischemia. EKG did not show any acute changes. No further workup is recommended, especially with the patient's advanced age and comorbidities. It is recommended that she take an aspirin once a day. She will be continued empirically on a course of ciprofloxacin. The patient appears ready for discharge. The family wishes to take the patient home rather than return to skilled nursing facility. The reasoning behind this was not made clear. She's been set up with home health nursing, physical therapy, social work, Engineer, production. We'll also order a hospital bed as well as a walker. Her microcytic anemia appears  to be stable. This can be further followed up in the outpatient setting with her primary care doctor.  Procedures:  none  Consultations:  none  Discharge Exam: Filed Vitals:   04/04/13 1833 04/04/13 2307 04/05/13 0454 04/05/13 1000  BP: 126/58 143/89 137/66 96/55  Pulse: 84 78  77  Temp: 98.4 F (36.9 C) 99.6 F (37.6 C)  98.3 F (36.8 C)   TempSrc: Oral Oral Oral   Resp:      Height: 5\' 4"  (1.626 m)     Weight: 53.298 kg (117 lb 8 oz)     SpO2: 100% 96% 97%     General: NAD Cardiovascular: S1, S2 RRR Respiratory: CTA B  Discharge Instructions  Discharge Orders   Future Orders Complete By Expires     Call MD for:  temperature >100.4  As directed     Call MD for:  As directed     Comments:      Worsening confusion    DME Hospital bed  As directed     Questions:      Patient has (list medical condition):  alzheimers dementia, parkinson's disease    The above medical condition requires body part(s) to be positioned in ways not feasible with a normal bed: (list body part(s)):  patient is at higher risk for aspiration due to cognitive dysfunction and needs head of bed elevated    Head must be elevated at least:  30 degrees    Diet - low sodium heart healthy  As directed     Face-to-face encounter (required for Medicare/Medicaid patients)  As directed     Comments:      I Tyonna Talerico certify that this patient is under my care and that I, or a nurse practitioner or physician's assistant working with me, had a face-to-face encounter that meets the physician face-to-face encounter requirements with this patient on 04/05/2013. The encounter with the patient was in whole, or in part for the following medical condition(s) which is the primary reason for home health care (List medical condition): admitted with UTI, confusion and dehydration.  As advanced dementia.  Family wishes to take patient home.  Patient would benefit from home health RN, aide for daily activities.  Social work to help if placement is needed.  Physical therapy since she recently had hip fracture and is now starting to walk again.    Questions:      The encounter with the patient was in whole, or in part, for the following medical condition, which is the primary reason for home health care:  uti and dehydration    I certify that, based on my  findings, the following services are medically necessary home health services:  Nursing    Physical therapy    My clinical findings support the need for the above services:  Cognitive impairments, dementia, or mental confusion  that make it unsafe to leave home    Further, I certify that my clinical findings support that this patient is homebound due to:  Unable to leave home safely without assistance    Reason for Medically Necessary Home Health Services:  Skilled Nursing- Skilled Assessment/Observation    Home Health  As directed     Questions:      To provide the following care/treatments:  PT    RN    Home Health Aide    Social work    Increase activity slowly  As directed     Walker rolling  As directed  Medication List         acetaminophen 500 MG tablet  Commonly known as:  TYLENOL  Take 500 mg by mouth at bedtime. For pain     ALPRAZolam 0.5 MG tablet  Commonly known as:  XANAX  Take 1 tablet (0.5 mg total) by mouth 3 (three) times daily. For anxiety     ALPRAZolam 0.5 MG tablet  Commonly known as:  XANAX  Take 0.5 mg by mouth every 4 (four) hours as needed for anxiety.     benztropine 1 MG tablet  Commonly known as:  COGENTIN  Take 1 tablet (1 mg total) by mouth daily.     bumetanide 0.5 MG tablet  Commonly known as:  BUMEX  Take 1 tablet (0.5 mg total) by mouth daily as needed (swelling).     ciprofloxacin 500 MG tablet  Commonly known as:  CIPRO  Take 1 tablet (500 mg total) by mouth 2 (two) times daily.     citalopram 20 MG tablet  Commonly known as:  CELEXA  Take 1 tablet (20 mg total) by mouth every morning.     diclofenac sodium 1 % Gel  Commonly known as:  VOLTAREN  Apply 2 g topically 2 (two) times daily as needed (arm pain). For arm pain     ferrous sulfate 325 (65 FE) MG tablet  Take 1 tablet (325 mg total) by mouth 2 (two) times daily.     HYDROcodone-acetaminophen 5-325 MG per tablet  Commonly known as:  NORCO/VICODIN  Take 1 tablet  by mouth 3 (three) times daily.     HYDROcodone-acetaminophen 5-325 MG per tablet  Commonly known as:  NORCO/VICODIN  Take 0.5 tablets by mouth 4 (four) times daily as needed for pain (Break Through Pain).     metoprolol tartrate 25 MG tablet  Commonly known as:  LOPRESSOR  Take 1 tablet (25 mg total) by mouth daily.     omeprazole 20 MG capsule  Commonly known as:  PRILOSEC  Take 1 capsule (20 mg total) by mouth daily.     rivastigmine 9.5 mg/24hr  Commonly known as:  EXELON  Place 1 patch (9.5 mg total) onto the skin daily. *Remove and discard used patches*     zolpidem 5 MG tablet  Commonly known as:  AMBIEN  Take 5 mg by mouth at bedtime as needed for sleep.       Allergies  Allergen Reactions  . Penicillins Swelling and Rash      The results of significant diagnostics from this hospitalization (including imaging, microbiology, ancillary and laboratory) are listed below for reference.    Significant Diagnostic Studies: Ct Head Wo Contrast  04/04/2013   *RADIOLOGY REPORT*  Clinical Data: Fatigue, Alzheimer's dementia  CT HEAD WITHOUT CONTRAST  Technique:  Contiguous axial images were obtained from the base of the skull through the vertex without contrast.  Comparison: 01/31/2013  Findings: Motion degraded images.  No evidence of parenchymal hemorrhage or extra-axial fluid collection.  No mass effect or midline shift.  No CT evidence of acute infarction.  Small vessel ischemic changes.  Mild intracranial atherosclerosis.  Global cortical atrophy.  No ventriculomegaly.  Visualized paranasal sinuses and mastoid air cells are essentially clear.  No evidence of calvarial fracture.  IMPRESSION: Motion degraded images.  No evidence of acute intranial abnormality.  Atrophy with small vessel ischemic changes.   Original Report Authenticated By: Charline Bills, M.D.   Dg Chest Portable 1 View  04/04/2013   *RADIOLOGY REPORT*  Clinical Data: 60  PORTABLE CHEST - 1 VIEW  Comparison:  07/12/2012  Findings: Low volume film. Interstitial markings are diffusely coarsened with chronic features.  There is some basilar atelectasis.  No edema or focal airspace consolidation. The cardiopericardial silhouette is enlarged. Bones are diffusely demineralized.  IMPRESSION: Cardiomegaly with chronic interstitial coarsening and basilar atelectasis or scarring.   Original Report Authenticated By: Kennith Center, M.D.    Microbiology: No results found for this or any previous visit (from the past 240 hour(s)).   Labs: Basic Metabolic Panel:  Recent Labs Lab 04/04/13 1238 04/05/13 0648  NA 135 137  K 4.4 4.1  CL 99 105  CO2 30 26  GLUCOSE 109* 94  BUN 16 13  CREATININE 0.78 0.70  CALCIUM 9.8 9.1   Liver Function Tests:  Recent Labs Lab 04/04/13 1238  AST 17  ALT 10  ALKPHOS 93  BILITOT 0.5  PROT 8.7*  ALBUMIN 3.4*   No results found for this basename: LIPASE, AMYLASE,  in the last 168 hours No results found for this basename: AMMONIA,  in the last 168 hours CBC:  Recent Labs Lab 04/04/13 1238 04/05/13 0730  WBC 11.2* 9.8  NEUTROABS 9.4*  --   HGB 10.4* 9.6*  HCT 33.6* 30.9*  MCV 68.2* 68.5*  PLT 384 PLATELET CLUMPS NOTED ON SMEAR, COUNT APPEARS ADEQUATE   Cardiac Enzymes:  Recent Labs Lab 04/04/13 1238 04/04/13 1904 04/05/13 0040 04/05/13 0648  TROPONINI 0.30* <0.30 <0.30 0.30*   BNP: BNP (last 3 results) No results found for this basename: PROBNP,  in the last 8760 hours CBG: No results found for this basename: GLUCAP,  in the last 168 hours     Signed:  Faren Florence  [facility] 04/05/2013, 1:15 PM

## 2013-04-05 NOTE — Progress Notes (Signed)
Pt's IV removed.  Site WNL.  AVS reviewed with patient's daughter.  Patient to be discharged home with patient's daughter, per family's request.  Prescriptions provided to patient's daughter.  Patient's daughter educated on what medications remain to be taken for the rest of the day.  Patient's daughter verbalized understanding of discharge instructions.  Pt's family reports all belongings intact and in possession at time of discharge.  Family educated that [facility] will be following the patient at home for the following:  RN, PT, Home Health Aide and Child psychotherapist.  Phone number for [facility] provided to patient's daughter.  Pt transported by NT via w/c to main entrance for discharge.  Pt stable at time of discharge.

## 2013-04-05 NOTE — Progress Notes (Signed)
Called [facility] to follow-up on referral faxed earlier.  Spoke to [place] at [place] office.  Stated that they had received the referral for PT, RN, Aide and Social Work to start 04/07/2013.  Stated that they would call if they had any questions.

## 2013-04-06 LAB — URINE CULTURE

## 2013-09-17 ENCOUNTER — Emergency Department (HOSPITAL_COMMUNITY): Payer: PRIVATE HEALTH INSURANCE

## 2013-09-17 ENCOUNTER — Encounter (HOSPITAL_COMMUNITY): Payer: Self-pay | Admitting: Emergency Medicine

## 2013-09-17 ENCOUNTER — Inpatient Hospital Stay (HOSPITAL_COMMUNITY)
Admission: EM | Admit: 2013-09-17 | Discharge: 2013-09-26 | DRG: 388 | Disposition: A | Discharge status: Home Health | Payer: PRIVATE HEALTH INSURANCE | Attending: Internal Medicine | Admitting: Internal Medicine

## 2013-09-17 DIAGNOSIS — J189 Pneumonia, unspecified organism: Secondary | ICD-10-CM

## 2013-09-17 DIAGNOSIS — K56609 Unspecified intestinal obstruction, unspecified as to partial versus complete obstruction: Principal | ICD-10-CM | POA: Diagnosis present

## 2013-09-17 DIAGNOSIS — Z9089 Acquired absence of other organs: Secondary | ICD-10-CM

## 2013-09-17 DIAGNOSIS — F0281 Dementia in other diseases classified elsewhere with behavioral disturbance: Secondary | ICD-10-CM | POA: Diagnosis present

## 2013-09-17 DIAGNOSIS — D72829 Elevated white blood cell count, unspecified: Secondary | ICD-10-CM

## 2013-09-17 DIAGNOSIS — I119 Hypertensive heart disease without heart failure: Secondary | ICD-10-CM | POA: Diagnosis present

## 2013-09-17 DIAGNOSIS — F02818 Dementia in other diseases classified elsewhere, unspecified severity, with other behavioral disturbance: Secondary | ICD-10-CM | POA: Diagnosis present

## 2013-09-17 DIAGNOSIS — R627 Adult failure to thrive: Secondary | ICD-10-CM | POA: Diagnosis present

## 2013-09-17 DIAGNOSIS — R531 Weakness: Secondary | ICD-10-CM

## 2013-09-17 DIAGNOSIS — I43 Cardiomyopathy in diseases classified elsewhere: Secondary | ICD-10-CM | POA: Diagnosis present

## 2013-09-17 DIAGNOSIS — Z79899 Other long term (current) drug therapy: Secondary | ICD-10-CM

## 2013-09-17 DIAGNOSIS — Z833 Family history of diabetes mellitus: Secondary | ICD-10-CM

## 2013-09-17 DIAGNOSIS — F0390 Unspecified dementia without behavioral disturbance: Secondary | ICD-10-CM

## 2013-09-17 DIAGNOSIS — F0280 Dementia in other diseases classified elsewhere without behavioral disturbance: Secondary | ICD-10-CM | POA: Diagnosis present

## 2013-09-17 DIAGNOSIS — J690 Pneumonitis due to inhalation of food and vomit: Secondary | ICD-10-CM | POA: Diagnosis present

## 2013-09-17 DIAGNOSIS — F0391 Unspecified dementia with behavioral disturbance: Secondary | ICD-10-CM

## 2013-09-17 DIAGNOSIS — I252 Old myocardial infarction: Secondary | ICD-10-CM

## 2013-09-17 DIAGNOSIS — Z9010 Acquired absence of unspecified breast and nipple: Secondary | ICD-10-CM

## 2013-09-17 DIAGNOSIS — E860 Dehydration: Secondary | ICD-10-CM

## 2013-09-17 DIAGNOSIS — R1310 Dysphagia, unspecified: Secondary | ICD-10-CM | POA: Diagnosis present

## 2013-09-17 DIAGNOSIS — G309 Alzheimer's disease, unspecified: Secondary | ICD-10-CM | POA: Diagnosis present

## 2013-09-17 DIAGNOSIS — G20 Parkinson's disease: Secondary | ICD-10-CM

## 2013-09-17 DIAGNOSIS — K769 Liver disease, unspecified: Secondary | ICD-10-CM | POA: Diagnosis present

## 2013-09-17 DIAGNOSIS — R1110 Vomiting, unspecified: Secondary | ICD-10-CM

## 2013-09-17 DIAGNOSIS — A0472 Enterocolitis due to Clostridium difficile, not specified as recurrent: Secondary | ICD-10-CM | POA: Diagnosis present

## 2013-09-17 DIAGNOSIS — E876 Hypokalemia: Secondary | ICD-10-CM | POA: Diagnosis present

## 2013-09-17 DIAGNOSIS — Z66 Do not resuscitate: Secondary | ICD-10-CM | POA: Diagnosis present

## 2013-09-17 DIAGNOSIS — Z7982 Long term (current) use of aspirin: Secondary | ICD-10-CM

## 2013-09-17 DIAGNOSIS — Z853 Personal history of malignant neoplasm of breast: Secondary | ICD-10-CM

## 2013-09-17 DIAGNOSIS — I2510 Atherosclerotic heart disease of native coronary artery without angina pectoris: Secondary | ICD-10-CM | POA: Diagnosis present

## 2013-09-17 DIAGNOSIS — K7689 Other specified diseases of liver: Secondary | ICD-10-CM | POA: Diagnosis present

## 2013-09-17 DIAGNOSIS — E870 Hyperosmolality and hypernatremia: Secondary | ICD-10-CM | POA: Diagnosis present

## 2013-09-17 LAB — BASIC METABOLIC PANEL
BUN: 23 mg/dL (ref 6–23)
CO2: 28 mEq/L (ref 19–32)
Calcium: 10.1 mg/dL (ref 8.4–10.5)
Chloride: 97 mEq/L (ref 96–112)
Creatinine, Ser: 0.83 mg/dL (ref 0.50–1.10)
GFR calc Af Amer: 69 mL/min — ABNORMAL LOW (ref >90)
GFR calc non Af Amer: 60 mL/min — ABNORMAL LOW (ref >90)
Glucose, Bld: 127 mg/dL — ABNORMAL HIGH (ref 70–99)
Potassium: 4.5 mEq/L (ref 3.7–5.3)
Sodium: 141 mEq/L (ref 137–147)

## 2013-09-17 LAB — HEPATIC FUNCTION PANEL
ALT: 28 U/L (ref 0–35)
AST: 23 U/L (ref 0–37)
Albumin: 4 g/dL (ref 3.5–5.2)
Alkaline Phosphatase: 72 U/L (ref 39–117)
Bilirubin, Direct: <0.2 mg/dL (ref 0.0–0.3)
Total Bilirubin: 0.7 mg/dL (ref 0.3–1.2)
Total Protein: 7.5 g/dL (ref 6.0–8.3)

## 2013-09-17 LAB — CBC WITH DIFFERENTIAL/PLATELET
Basophils Absolute: 0 10*3/uL (ref 0.0–0.1)
Basophils Relative: 0 % (ref 0–1)
Eosinophils Absolute: 0 10*3/uL (ref 0.0–0.7)
Eosinophils Relative: 0 % (ref 0–5)
HCT: 42.2 % (ref 36.0–46.0)
Hemoglobin: 13.8 g/dL (ref 12.0–15.0)
Lymphocytes Relative: 7 % — ABNORMAL LOW (ref 12–46)
Lymphs Abs: 1 10*3/uL (ref 0.7–4.0)
MCH: 23.2 pg — ABNORMAL LOW (ref 26.0–34.0)
MCHC: 32.7 g/dL (ref 30.0–36.0)
MCV: 70.9 fL — ABNORMAL LOW (ref 78.0–100.0)
Monocytes Absolute: 1.2 10*3/uL — ABNORMAL HIGH (ref 0.1–1.0)
Monocytes Relative: 8 % (ref 3–12)
Neutro Abs: 12.7 10*3/uL — ABNORMAL HIGH (ref 1.7–7.7)
Neutrophils Relative %: 85 % — ABNORMAL HIGH (ref 43–77)
Platelets: 307 10*3/uL (ref 150–400)
RBC: 5.95 MIL/uL — ABNORMAL HIGH (ref 3.87–5.11)
RDW: 17.8 % — ABNORMAL HIGH (ref 11.5–15.5)
WBC: 14.9 10*3/uL — ABNORMAL HIGH (ref 4.0–10.5)

## 2013-09-17 LAB — URINALYSIS, ROUTINE W REFLEX MICROSCOPIC
Bilirubin Urine: NEGATIVE
Glucose, UA: NEGATIVE mg/dL
Hgb urine dipstick: NEGATIVE
Ketones, ur: NEGATIVE mg/dL
Leukocytes, UA: NEGATIVE
Nitrite: NEGATIVE
Protein, ur: NEGATIVE mg/dL
Specific Gravity, Urine: 1.015 (ref 1.005–1.030)
Urobilinogen, UA: 0.2 mg/dL (ref 0.0–1.0)
pH: 5 (ref 5.0–8.0)

## 2013-09-17 LAB — LIPASE, BLOOD: Lipase: 82 U/L — ABNORMAL HIGH (ref 11–59)

## 2013-09-17 MED ORDER — HALOPERIDOL LACTATE 5 MG/ML IJ SOLN
2.0000 mg | Freq: Four times a day (QID) | INTRAMUSCULAR | Status: DC | PRN
  Administered 2013-09-17 – 2013-09-20 (×2): 2 mg via INTRAVENOUS
  Filled 2013-09-17 (×2): qty 1

## 2013-09-17 MED ORDER — IOHEXOL 300 MG/ML  SOLN: 50.0000 mL | Freq: Once | INTRAMUSCULAR | Status: AC | PRN

## 2013-09-17 MED ORDER — ONDANSETRON HCL 4 MG/2ML IJ SOLN
4.0000 mg | Freq: Four times a day (QID) | INTRAMUSCULAR | Status: DC | PRN
  Administered 2013-09-17: 4 mg via INTRAVENOUS
  Filled 2013-09-17: qty 2

## 2013-09-17 MED ORDER — RIVASTIGMINE 9.5 MG/24HR TD PT24
9.5000 mg | MEDICATED_PATCH | Freq: Every day | TRANSDERMAL | Status: DC
  Administered 2013-09-18 – 2013-09-26 (×9): 9.5 mg via TRANSDERMAL
  Filled 2013-09-17 (×12): qty 1

## 2013-09-17 MED ORDER — ONDANSETRON HCL 4 MG/2ML IJ SOLN
4.0000 mg | Freq: Once | INTRAMUSCULAR | Status: AC
  Administered 2013-09-17: 4 mg via INTRAVENOUS
  Filled 2013-09-17: qty 2

## 2013-09-17 MED ORDER — ONDANSETRON HCL 4 MG PO TABS
4.0000 mg | ORAL_TABLET | Freq: Four times a day (QID) | ORAL | Status: DC | PRN
  Administered 2013-09-23: 4 mg via ORAL
  Filled 2013-09-17 (×2): qty 1

## 2013-09-17 MED ORDER — HYDRALAZINE HCL 20 MG/ML IJ SOLN: 10.0000 mg | Freq: Four times a day (QID) | INTRAMUSCULAR | Status: DC | PRN

## 2013-09-17 MED ORDER — ACETAMINOPHEN 325 MG PO TABS
650.0000 mg | ORAL_TABLET | Freq: Four times a day (QID) | ORAL | Status: DC | PRN
  Administered 2013-09-24: 650 mg via ORAL
  Filled 2013-09-17: qty 2

## 2013-09-17 MED ORDER — SODIUM CHLORIDE 0.9 % IV BOLUS (SEPSIS)
500.0000 mL | Freq: Once | INTRAVENOUS | Status: AC
  Administered 2013-09-17: 500 mL via INTRAVENOUS

## 2013-09-17 MED ORDER — MORPHINE SULFATE 2 MG/ML IJ SOLN
1.0000 mg | INTRAMUSCULAR | Status: DC | PRN
  Administered 2013-09-17 – 2013-09-24 (×4): 1 mg via INTRAVENOUS
  Filled 2013-09-17 (×5): qty 1

## 2013-09-17 MED ORDER — IOHEXOL 300 MG/ML  SOLN: 100.0000 mL | Freq: Once | INTRAMUSCULAR | Status: DC | PRN

## 2013-09-17 MED ORDER — ACETAMINOPHEN 650 MG RE SUPP: 650.0000 mg | Freq: Four times a day (QID) | RECTAL | Status: DC | PRN

## 2013-09-17 MED ORDER — ENOXAPARIN SODIUM 40 MG/0.4ML ~~LOC~~ SOLN: 40.0000 mg | SUBCUTANEOUS | Status: DC

## 2013-09-17 MED ORDER — PANTOPRAZOLE SODIUM 40 MG IV SOLR
40.0000 mg | INTRAVENOUS | Status: DC
  Administered 2013-09-17 – 2013-09-25 (×9): 40 mg via INTRAVENOUS
  Filled 2013-09-17 (×9): qty 40

## 2013-09-17 MED ORDER — SODIUM CHLORIDE 0.9 % IV SOLN
INTRAVENOUS | Status: AC
  Administered 2013-09-17: 20:00:00 via INTRAVENOUS

## 2013-09-17 MED ORDER — ONDANSETRON HCL 4 MG/2ML IJ SOLN
4.0000 mg | Freq: Four times a day (QID) | INTRAMUSCULAR | Status: DC | PRN
  Administered 2013-09-18 – 2013-09-22 (×4): 4 mg via INTRAVENOUS
  Filled 2013-09-17 (×7): qty 2

## 2013-09-17 MED ORDER — SODIUM CHLORIDE 0.9 % IV SOLN
INTRAVENOUS | Status: DC
  Administered 2013-09-17 – 2013-09-20 (×3): via INTRAVENOUS

## 2013-09-17 NOTE — H&P (Signed)
[facility] History and Physical  [name] BMW:[id number] DOB: Dec 27, 1919 DOA: 09/17/2013   PCP: Evlyn Courier, MD  Specialists: None  Chief Complaint: Nausea and vomiting  HPI: [name] is a 79 y.o. female with a past with history of Parkinson's disease, and Alzheimer's dementia, and hypertension, who was in her usual state of health till Sunday, when she started having nausea. Patient has advanced dementia and was unable to provide any history. Most of the history was provided by her daughter. Apparently, the daughter took her home for holidays and she did well. The patient does live in her own home, but she has family support. On Sunday she started having some nausea, but was able to keep her meals down. She subsequently had episodes of diarrhea and vomiting on Monday. On Tuesday she had multiple episodes of vomiting without any diarrhea. She did not have any bleeding in her stool or in the emesis. On Wednesday her daughter felt that she was dehydrated. Patient was not taking anything by mouth. She had chills, but no fever. She was passing urine. Because patient was not improving she decided to bring her to the hospital. She hasn't had any bowel movements since Monday. History is limited due to the patient's dementia.   Home Medications: Prior to Admission medications   Medication Sig Start Date End Date Taking? Authorizing Provider  acetaminophen (TYLENOL) 500 MG tablet Take 500 mg by mouth at bedtime. For pain   Yes Historical Provider, MD  ALPRAZolam Prudy Feeler) 0.5 MG tablet Take 0.5 mg by mouth daily.   Yes Historical Provider, MD  aspirin EC 81 MG tablet Take 1 tablet (81 mg total) by mouth daily. 04/05/13  Yes Erick Blinks, MD  benztropine (COGENTIN) 1 MG tablet Take 1 tablet (1 mg total) by mouth daily. 04/05/13  Yes Erick Blinks, MD  bumetanide (BUMEX) 0.5 MG tablet Take 1 tablet (0.5 mg total) by mouth daily as needed (swelling). 04/05/13  Yes Erick Blinks, MD   citalopram (CELEXA) 20 MG tablet Take 1 tablet (20 mg total) by mouth every morning. 04/05/13  Yes Erick Blinks, MD  haloperidol (HALDOL) 2 MG/ML solution Take 1 mg by mouth 2 (two) times daily as needed for agitation.   Yes Historical Provider, MD  metoprolol (LOPRESSOR) 50 MG tablet Take 25 mg by mouth daily.   Yes Historical Provider, MD  rivastigmine (EXELON) 9.5 mg/24hr Place 1 patch (9.5 mg total) onto the skin daily. *Remove and discard used patches* 04/05/13  Yes Erick Blinks, MD  zolpidem (AMBIEN) 10 MG tablet Take 10 mg by mouth at bedtime as needed for sleep.   Yes Historical Provider, MD    Allergies:  Allergies  Allergen Reactions  . Penicillins Swelling and Rash    Past Medical History: Past Medical History  Diagnosis Date  . Hypertension   . Heart attack   . Parkinson disease   . Cancer     breast  . Alzheimer's dementia     Past Surgical History  Procedure Laterality Date  . Breast surgery    . Abdominal hysterectomy    . Mastectomy    . Small intestine removed    . Tonsillectomy    . Cholecystectomy    . Colon surgery    . Pelvic fracture surgery    . Leg surgery    . Arm surgery    . Hip pinning,cannulated Right 02/01/2013    Procedure: CANNULATED HIP PINNING;  Surgeon: Darreld Mclean, MD;  Location: [facility];  Service: Orthopedics;  Laterality: Right;    Social History: . Patient lives in her own home, but her family stays with her. No alcohol, smoking or illicit drug use. She's able to walk with assistance only. But does not use any assistive devices.    Family History:  Family History  Problem Relation Age of Onset  . Aneurysm Mother   . Diabetes Father      Review of Systems - unable to do due to dementia   Physical Examination  Filed Vitals:   09/17/13 1236 09/17/13 1758 09/17/13 1957  BP: 151/108 141/80 127/73  Pulse: 105 80   Temp: 98 F (36.7 C)  98.6 F (37 C)  TempSrc: Oral  Oral  Resp: 16 19 20   SpO2: 94% 92%     General  appearance: alert, appears stated age, distracted, no distress and slowed mentation Head: Normocephalic, without obvious abnormality, atraumatic Eyes: conjunctivae/corneas clear. PERRL, EOM's intact.  Neck: no adenopathy, no carotid bruit, no JVD, supple, symmetrical, trachea midline and thyroid not enlarged, symmetric, no tenderness/mass/nodules Resp: clear to auscultation bilaterally Cardio: regular rate and rhythm, S1, S2 normal, systolic murmur over apex. No click, rub or gallop GI: soft, tenderness, with guarding is appreciated in the lower abdomen without any rebound or rigidity. Bowel sounds present. No masses or organomegaly. Extremities: extremities normal, atraumatic, no cyanosis or edema Pulses: 2+ and symmetric Skin: Skin color, texture, turgor normal. No rashes or lesions Lymph nodes: Cervical, supraclavicular, and axillary nodes normal. Neurologic: She is alert, confused. No focal neurological deficits are present. Gait was not assessed.  Laboratory Data: Results for orders placed during the hospital encounter of 09/17/13 (from the past 48 hour(s))  CBC WITH DIFFERENTIAL     Status: Abnormal   Collection Time    09/17/13  1:38 PM      Result Value Range   WBC 14.9 (*) 4.0 - 10.5 K/uL   RBC 5.95 (*) 3.87 - 5.11 MIL/uL   Hemoglobin 13.8  12.0 - 15.0 g/dL   HCT 16.1  09.6 - 04.5 %   MCV 70.9 (*) 78.0 - 100.0 fL   MCH 23.2 (*) 26.0 - 34.0 pg   MCHC 32.7  30.0 - 36.0 g/dL   RDW 40.9 (*) 81.1 - 91.4 %   Platelets 307  150 - 400 K/uL   Neutrophils Relative % 85 (*) 43 - 77 %   Lymphocytes Relative 7 (*) 12 - 46 %   Monocytes Relative 8  3 - 12 %   Eosinophils Relative 0  0 - 5 %   Basophils Relative 0  0 - 1 %   Neutro Abs 12.7 (*) 1.7 - 7.7 K/uL   Lymphs Abs 1.0  0.7 - 4.0 K/uL   Monocytes Absolute 1.2 (*) 0.1 - 1.0 K/uL   Eosinophils Absolute 0.0  0.0 - 0.7 K/uL   Basophils Absolute 0.0  0.0 - 0.1 K/uL   WBC Morphology ATYPICAL LYMPHOCYTES     Comment: MILD LEFT SHIFT  (1-5% METAS, OCC MYELO, OCC BANDS)     VACUOLATED NEUTROPHILS   Smear Review PLATELET COUNT CONFIRMED BY SMEAR     Comment: LARGE PLATELETS PRESENT     GIANT PLATELETS SEEN  BASIC METABOLIC PANEL     Status: Abnormal   Collection Time    09/17/13  1:38 PM      Result Value Range   Sodium 141  137 - 147 mEq/L   Comment: Please note change in reference range.  Potassium 4.5  3.7 - 5.3 mEq/L   Comment: Please note change in reference range.   Chloride 97  96 - 112 mEq/L   CO2 28  19 - 32 mEq/L   Glucose, Bld 127 (*) 70 - 99 mg/dL   BUN 23  6 - 23 mg/dL   Creatinine, Ser 7.82  0.50 - 1.10 mg/dL   Calcium 95.6  8.4 - 21.3 mg/dL   GFR calc non Af Amer 60 (*) >90 mL/min   GFR calc Af Amer 69 (*) >90 mL/min   Comment: (NOTE)     The eGFR has been calculated using the CKD EPI equation.     This calculation has not been validated in all clinical situations.     eGFR's persistently <90 mL/min signify possible Chronic Kidney     Disease.  HEPATIC FUNCTION PANEL     Status: None   Collection Time    09/17/13  1:38 PM      Result Value Range   Total Protein 7.5  6.0 - 8.3 g/dL   Albumin 4.0  3.5 - 5.2 g/dL   AST 23  0 - 37 U/L   ALT 28  0 - 35 U/L   Alkaline Phosphatase 72  39 - 117 U/L   Total Bilirubin 0.7  0.3 - 1.2 mg/dL   Bilirubin, Direct <0.8  0.0 - 0.3 mg/dL   Indirect Bilirubin NOT CALCULATED  0.3 - 0.9 mg/dL  LIPASE, BLOOD     Status: Abnormal   Collection Time    09/17/13  1:38 PM      Result Value Range   Lipase 82 (*) 11 - 59 U/L  URINALYSIS, ROUTINE W REFLEX MICROSCOPIC     Status: None   Collection Time    09/17/13  1:55 PM      Result Value Range   Color, Urine YELLOW  YELLOW   APPearance CLEAR  CLEAR   Specific Gravity, Urine 1.015  1.005 - 1.030   pH 5.0  5.0 - 8.0   Glucose, UA NEGATIVE  NEGATIVE mg/dL   Hgb urine dipstick NEGATIVE  NEGATIVE   Bilirubin Urine NEGATIVE  NEGATIVE   Ketones, ur NEGATIVE  NEGATIVE mg/dL   Protein, ur NEGATIVE  NEGATIVE mg/dL    Urobilinogen, UA 0.2  0.0 - 1.0 mg/dL   Nitrite NEGATIVE  NEGATIVE   Leukocytes, UA NEGATIVE  NEGATIVE   Comment: MICROSCOPIC NOT DONE ON URINES WITH NEGATIVE PROTEIN, BLOOD, LEUKOCYTES, NITRITE, OR GLUCOSE <1000 mg/dL.    Radiology Reports: Ct Abdomen Pelvis Wo Contrast  09/17/2013   CLINICAL DATA:  Vomiting and abdominal pain. Diarrhea. History of breast cancer.  EXAM: CT ABDOMEN AND PELVIS WITHOUT CONTRAST  TECHNIQUE: Multidetector CT imaging of the abdomen and pelvis was performed following the standard protocol without intravenous contrast.  COMPARISON:  Multiple exams, including 09/17/2013  FINDINGS: Despite efforts by the technologist and patient, motion artifact is present on today's exam and could not be eliminated. This reduces exam sensitivity and specificity. Mild atelectasis suspected in both lower lobes and posteriorly in the right middle lobe. Trace pericardial fluid anteriorly.  Note is again made of multiple hepatic cysts. The largest of these is primarily in the right hepatic lobe but measures 8.5 x 7.4 cm, significantly enlarged from that reported on the CT abdomen from 06/16/2002. However, the sites the scattered cysts, there also appear to be some dense liver lesions including a 4.8 x 4.6 cm lesion with target cord appearance and segment 2  and a complex 4.8 x 2.1 cm lesion in segment 3 of the liver. Numerous additional scattered hypodensities in the liver technically too small to characterize.  No splenomegaly.  The contour of the pancreas is unremarkable.  2.2 x 1.2 cm mass of the medial limb left adrenal gland measures 8 Hounsfield units in density, compatible with adenoma.  Hypodense left kidney lower pole lesion, 1.9 cm in diameter, fluid density.  Provided history of cholecystectomy, but a structure very likely to represent the gallbladder is shown along the gallbladder fossa and accordingly I doubt that the patient has actually had cholecystectomy. There are some clips adjacent  to The suspected gallbladder.  The colon is nondilated than the patient seems to have a partial right hemicolectomy. There multiple loops of dilated small bowel and multiple loops of nondilated small bowel. The transition point seems to be in the left pelvis where there is frothy material in the small bowel and a sudden transition in bowel caliber. There is a small amount of free pelvic fluid in this vicinity. There is adjacent sigmoid diverticulosis but no compelling findings of active diverticulitis.  Superior endplate compression at T12 noted with anterior wedge compression at L1. The L1 compression fracture has mild posterior bony retropulsion and there is mild grade 1 posterior subluxation at L1-2.  Screws are noted in the right hip. There is deformity in the left pelvis from remote pelvic fractures.  IMPRESSION: 1. Small bowel obstruction, with transition point in the left lower pelvis. Cause for the obstruction is uncertain but the may be adhesion. 2. Complex lesions in the left hepatic lobe could represent metastatic disease or complex cysts. The patient does have a history of polycystic liver disease, with enlargement of some of the cysts, but not of the cysts were reported as complex on the prior CT scan from 2003. 3. Provided history of cholecystectomy, but a candidate structure for gallbladder suggests that this history is wrong. 4. Trace pericardial effusion. 5. Mild bibasilar subsegmental atelectasis. 6. Superior endplate compression fracture T12 with anterior wedge compression fracture of L1, age indeterminate. 7. Left adrenal adenoma.   Electronically Signed   By: Herbie Baltimore M.D.   On: 09/17/2013 18:26   Dg Abd Acute W/chest  09/17/2013   CLINICAL DATA:  Vomiting.  Dehydration.  EXAM: ACUTE ABDOMEN SERIES (ABDOMEN 2 VIEW & CHEST 1 VIEW)  COMPARISON:  One-view chest 04/04/2013  FINDINGS: Mild cardiac enlargement is again noted. There is mild right basilar atelectasis. Mild pulmonary vascular  congestion is present without frank edema. No other focal airspace disease is evident. The patient is rotated to the right.  There is mild prominence of a loop of colon extending from the right lower quadrant to the left upper quadrant. Mild gaseous distention of small bowel is evident in the left lower quadrant. Surgical clips are present in the right lower quadrant.  Leftward curvature is present in the lumbar spine. Postoperative changes are noted at the right hip. Remote fractures are present in the left superior and inferior pubic rami.  IMPRESSION: 1. Dilated loop of colon as described with multiple mildly dilated loops of small bowel in the left lower quadrant. This raises the possibility of the developing sigmoid volvulus. There is no evidence for free air. 2. Postsurgical changes in the right lower quadrant. 3. Stable cardiomegaly without failure. 4. Mild atelectasis at the right lung base.   Electronically Signed   By: Gennette Pac M.D.   On: 09/17/2013 15:15  Problem List  Principal Problem:   SBO (small bowel obstruction) Active Problems:   Dementia   Parkinson disease   Lesion of liver   Assessment: . This is a 79 year old, African American female, who presents with vomiting for the last few days and is found to have a small bowel obstruction. She could have adhesions. She has poor venous access. She is dehydrated as well   Plan:  #1 small bowel obstruction: NG tube is in place. She is fighting it and attempting to pull it out. So, we will have to restrain her. This has been explained and discussed with the daughter. General surgery has been consulted and they will see her in the morning. At this time we will use conservative approach. She'll be kept n.p.o. and given IV fluids.  #2 liver lesions: Most likely cystic lesions. She has had them in the past. Apparently other family member also has same lesions. I would not advocate further workup for this at this time.  #3 history  of Parkinson's disease: Stable. Continue to monitor.  #4 history of dementia: Continue with exelon patch and use Haldol as needed.   #5 history of hypertension: Parenteral hydralazine as needed. Continue to monitor blood pressure.   Will order midline placement for poor venous access.  DVT Prophylaxis:  Lovenox Code Status: CODE STATUS discussed with the patient's daughter and the patient. Will be a DO NOT RESUSCITATE  Family Communication: . Discussed with the daughter   Disposition Plan:  admit to MedSurg    Further management decisions will depend on results of further testing and patient's response to treatment.  [facility]  [facility] Pager [phone]  If 7PM-7AM, please contact night-coverage [email or web address] Password [email or web address]  09/17/2013, 8:21 PM

## 2013-09-17 NOTE — ED Provider Notes (Addendum)
CSN: [id number]     Arrival date & time 09/17/13  1224 History   This chart was scribed for Enid Skeens, MD, by Yevette Edwards, ED Scribe. This patient was seen in room [facility] and the patient's care was started at 1:14 PM.  None    Chief Complaint  Patient presents with  . Emesis  . Diarrhea   Patient is a 79 y.o. female presenting with vomiting and diarrhea. The history is provided by the patient and a relative. The history is limited by the condition of the patient. No language interpreter was used.  Emesis Associated symptoms: abdominal pain and diarrhea   Associated symptoms: no headaches   Diarrhea Associated symptoms: abdominal pain and vomiting   Associated symptoms: no headaches    HPI Comments: [name] is a 79 y.o. female, with a h/o HTN, Parkinson's, and Alzheimers, who presents to the Emergency Department complaining of diarrhea which has persisted for four days. Her daughter reports the pt has also experienced emesis for four days as well; she experienced multiple episodes yesterday. The pt has not been able to tolerate liquids, including ice chips. Additionally, the pt has experienced abdominal pain and a cough. Her daughter denies any extremity swelling. She also denies any new medications including antibiotics,  any history of C-Diff, known sick contacts, or recent travels. The pt has a h/o abdominal surgery including an abdominal hysterectomy, small intestine removal, a cholecystectomy, and colon surgery. Her daughter also reports the pt has a h/o cardiac issues.  The pt is a non-smoker.   Past Medical History  Diagnosis Date  . Hypertension   . Heart attack   . Parkinson disease   . Cancer     breast  . Alzheimer's dementia    Past Surgical History  Procedure Laterality Date  . Breast surgery    . Abdominal hysterectomy    . Mastectomy    . Small intestine removed    . Tonsillectomy    . Cholecystectomy    . Colon surgery    . Pelvic fracture  surgery    . Leg surgery    . Arm surgery    . Hip pinning,cannulated Right 02/01/2013    Procedure: CANNULATED HIP PINNING;  Surgeon: Darreld Mclean, MD;  Location: [facility];  Service: Orthopedics;  Laterality: Right;   Family History  Problem Relation Age of Onset  . Aneurysm Mother   . Diabetes Father    History  Substance Use Topics  . Smoking status: Never Smoker   . Smokeless tobacco: Never Used  . Alcohol Use: No   No OB history provided.  Review of Systems  Constitutional: Positive for appetite change (Decreased) and fatigue.  Eyes: Negative for visual disturbance.  Respiratory: Positive for cough.   Cardiovascular: Negative for leg swelling.  Gastrointestinal: Positive for vomiting, abdominal pain and diarrhea. Negative for blood in stool.  Genitourinary: Negative for difficulty urinating.  Neurological: Positive for weakness. Negative for headaches.  All other systems reviewed and are negative.   Allergies  Penicillins  Home Medications   Current Outpatient Rx  Name  Route  Sig  Dispense  Refill  . acetaminophen (TYLENOL) 500 MG tablet   Oral   Take 500 mg by mouth at bedtime. For pain         . ALPRAZolam (XANAX) 0.5 MG tablet   Oral   Take 0.5 mg by mouth every 4 (four) hours as needed for anxiety.         Marland Kitchen  ALPRAZolam (XANAX) 0.5 MG tablet   Oral   Take 1 tablet (0.5 mg total) by mouth 3 (three) times daily. For anxiety   90 tablet   0   . aspirin EC 81 MG tablet   Oral   Take 1 tablet (81 mg total) by mouth daily.   30 tablet   0   . benztropine (COGENTIN) 1 MG tablet   Oral   Take 1 tablet (1 mg total) by mouth daily.   30 tablet   0   . bumetanide (BUMEX) 0.5 MG tablet   Oral   Take 1 tablet (0.5 mg total) by mouth daily as needed (swelling).   30 tablet   0   . ciprofloxacin (CIPRO) 500 MG tablet   Oral   Take 1 tablet (500 mg total) by mouth 2 (two) times daily.   8 tablet   0   . citalopram (CELEXA) 20 MG tablet   Oral    Take 1 tablet (20 mg total) by mouth every morning.   30 tablet   0   . diclofenac sodium (VOLTAREN) 1 % GEL   Topical   Apply 2 g topically 2 (two) times daily as needed (arm pain). For arm pain         . ferrous sulfate 325 (65 FE) MG tablet   Oral   Take 1 tablet (325 mg total) by mouth 2 (two) times daily.   60 tablet   3   . HYDROcodone-acetaminophen (NORCO/VICODIN) 5-325 MG per tablet   Oral   Take 0.5 tablets by mouth 4 (four) times daily as needed for pain (Break Through Pain).         Marland Kitchen HYDROcodone-acetaminophen (NORCO/VICODIN) 5-325 MG per tablet   Oral   Take 1 tablet by mouth 3 (three) times daily.   30 tablet   0   . metoprolol tartrate (LOPRESSOR) 25 MG tablet   Oral   Take 1 tablet (25 mg total) by mouth daily.   60 tablet   0   . omeprazole (PRILOSEC) 20 MG capsule   Oral   Take 1 capsule (20 mg total) by mouth daily.   30 capsule   0   . rivastigmine (EXELON) 9.5 mg/24hr   Transdermal   Place 1 patch (9.5 mg total) onto the skin daily. *Remove and discard used patches*   30 patch   12   . zolpidem (AMBIEN) 5 MG tablet   Oral   Take 5 mg by mouth at bedtime as needed for sleep.          Triage Vitals: BP 151/108  Pulse 105  Temp(Src) 98 F (36.7 C) (Oral)  Resp 16  SpO2 94%  Physical Exam  Nursing note and vitals reviewed. Constitutional: She is oriented to person, place, and time. No distress.  HENT:  Head: Normocephalic and atraumatic.  Dry mucus membranes.  Eyes: EOM are normal. Pupils are equal, round, and reactive to light.  Chronic changes to sclera.   Neck: Neck supple. No tracheal deviation present.  No significant adenopathy at all.   Cardiovascular:  Murmur heard. A whole systolic murmur, left sternal border.  Regular rate.   Pulmonary/Chest: Effort normal. No respiratory distress. She has rales.  Mild decreased effort.  A few rales to the left.  No respiratory distress.   Abdominal: Soft. She exhibits no  distension. There is tenderness.  No significant distension.  Mild discomfort to right side of abdomen.   Musculoskeletal: Normal range  of motion. She exhibits no edema.  No significant leg swelling at all.   Neurological: She is alert and oriented to person, place, and time.  Skin: Skin is warm and dry. No rash noted.  Do not appreciate any rashes.  Psychiatric:  Pleasant dementia, alert, follows most commands    ED Course  Procedures (including critical care time) Emergency Ultrasound Study:   Angiocath insertion Performed by: Enid Skeens  Consent: Verbal consent obtained. Risks and benefits: risks, benefits and alternatives were discussed Immediately prior to procedure the correct patient, procedure, equipment, support staff and site/side marked as needed.  Indication: difficult IV access Preparation: Patient was prepped and draped in the usual sterile fashion. Vein Location: right AC vein was visualized during assessment for potential access sites and was found to be patent/ easily compressed with linear ultrasound.  The needle was visualized with real-time ultrasound and guided into the vein. Gauge: 20 g  Image saved and stored.  Normal blood return.  Patient tolerance: Patient tolerated the procedure well with no immediate complications.   Emergency Ultrasound Study:   Angiocath insertion Performed by: Enid Skeens  Consent: Verbal consent obtained. Risks and benefits: risks, benefits and alternatives were discussed Immediately prior to procedure the correct patient, procedure, equipment, support staff and site/side marked as needed.  Indication: difficult IV access Preparation: Patient was prepped and draped in the usual sterile fashion. Vein Location: right basilic vein was visualized during assessment for potential access sites and was found to be patent/ easily compressed with linear ultrasound.  The needle was visualized with real-time ultrasound and  guided into the vein. Gauge: 20 g long  Image saved and stored.  Normal blood return.  Patient tolerance: Patient tolerated the procedure well with no immediate complications.      DIAGNOSTIC STUDIES: Oxygen Saturation is 94% on room air, adequate by my interpretation.    COORDINATION OF CARE:  1:19 PM- Discussed treatment plan with patient and her daughter, and they agreed to the plan.   1:23 PM- Ultrasound-guided IV above  Labs Review Labs Reviewed  CBC WITH DIFFERENTIAL - Abnormal; Notable for the following:    WBC 14.9 (*)    RBC 5.95 (*)    MCV 70.9 (*)    MCH 23.2 (*)    RDW 17.8 (*)    Neutrophils Relative % 85 (*)    Lymphocytes Relative 7 (*)    Neutro Abs 12.7 (*)    Monocytes Absolute 1.2 (*)    All other components within normal limits  BASIC METABOLIC PANEL - Abnormal; Notable for the following:    Glucose, Bld 127 (*)    GFR calc non Af Amer 60 (*)    GFR calc Af Amer 69 (*)    All other components within normal limits  LIPASE, BLOOD - Abnormal; Notable for the following:    Lipase 82 (*)    All other components within normal limits  URINALYSIS, ROUTINE W REFLEX MICROSCOPIC  HEPATIC FUNCTION PANEL   Imaging Review Ct Abdomen Pelvis Wo Contrast  09/17/2013   CLINICAL DATA:  Vomiting and abdominal pain. Diarrhea. History of breast cancer.  EXAM: CT ABDOMEN AND PELVIS WITHOUT CONTRAST  TECHNIQUE: Multidetector CT imaging of the abdomen and pelvis was performed following the standard protocol without intravenous contrast.  COMPARISON:  Multiple exams, including 09/17/2013  FINDINGS: Despite efforts by the technologist and patient, motion artifact is present on today's exam and could not be eliminated. This reduces exam sensitivity and specificity.  Mild atelectasis suspected in both lower lobes and posteriorly in the right middle lobe. Trace pericardial fluid anteriorly.  Note is again made of multiple hepatic cysts. The largest of these is primarily in the  right hepatic lobe but measures 8.5 x 7.4 cm, significantly enlarged from that reported on the CT abdomen from 06/16/2002. However, the sites the scattered cysts, there also appear to be some dense liver lesions including a 4.8 x 4.6 cm lesion with target cord appearance and segment 2 and a complex 4.8 x 2.1 cm lesion in segment 3 of the liver. Numerous additional scattered hypodensities in the liver technically too small to characterize.  No splenomegaly.  The contour of the pancreas is unremarkable.  2.2 x 1.2 cm mass of the medial limb left adrenal gland measures 8 Hounsfield units in density, compatible with adenoma.  Hypodense left kidney lower pole lesion, 1.9 cm in diameter, fluid density.  Provided history of cholecystectomy, but a structure very likely to represent the gallbladder is shown along the gallbladder fossa and accordingly I doubt that the patient has actually had cholecystectomy. There are some clips adjacent to The suspected gallbladder.  The colon is nondilated than the patient seems to have a partial right hemicolectomy. There multiple loops of dilated small bowel and multiple loops of nondilated small bowel. The transition point seems to be in the left pelvis where there is frothy material in the small bowel and a sudden transition in bowel caliber. There is a small amount of free pelvic fluid in this vicinity. There is adjacent sigmoid diverticulosis but no compelling findings of active diverticulitis.  Superior endplate compression at T12 noted with anterior wedge compression at L1. The L1 compression fracture has mild posterior bony retropulsion and there is mild grade 1 posterior subluxation at L1-2.  Screws are noted in the right hip. There is deformity in the left pelvis from remote pelvic fractures.  IMPRESSION: 1. Small bowel obstruction, with transition point in the left lower pelvis. Cause for the obstruction is uncertain but the may be adhesion. 2. Complex lesions in the left  hepatic lobe could represent metastatic disease or complex cysts. The patient does have a history of polycystic liver disease, with enlargement of some of the cysts, but not of the cysts were reported as complex on the prior CT scan from 2003. 3. Provided history of cholecystectomy, but a candidate structure for gallbladder suggests that this history is wrong. 4. Trace pericardial effusion. 5. Mild bibasilar subsegmental atelectasis. 6. Superior endplate compression fracture T12 with anterior wedge compression fracture of L1, age indeterminate. 7. Left adrenal adenoma.   Electronically Signed   By: Herbie Baltimore M.D.   On: 09/17/2013 18:26   Dg Abd Acute W/chest  09/17/2013   CLINICAL DATA:  Vomiting.  Dehydration.  EXAM: ACUTE ABDOMEN SERIES (ABDOMEN 2 VIEW & CHEST 1 VIEW)  COMPARISON:  One-view chest 04/04/2013  FINDINGS: Mild cardiac enlargement is again noted. There is mild right basilar atelectasis. Mild pulmonary vascular congestion is present without frank edema. No other focal airspace disease is evident. The patient is rotated to the right.  There is mild prominence of a loop of colon extending from the right lower quadrant to the left upper quadrant. Mild gaseous distention of small bowel is evident in the left lower quadrant. Surgical clips are present in the right lower quadrant.  Leftward curvature is present in the lumbar spine. Postoperative changes are noted at the right hip. Remote fractures are present in the left  superior and inferior pubic rami.  IMPRESSION: 1. Dilated loop of colon as described with multiple mildly dilated loops of small bowel in the left lower quadrant. This raises the possibility of the developing sigmoid volvulus. There is no evidence for free air. 2. Postsurgical changes in the right lower quadrant. 3. Stable cardiomegaly without failure. 4. Mild atelectasis at the right lung base.   Electronically Signed   By: Gennette Pac M.D.   On: 09/17/2013 15:15    EKG  Interpretation   None       MDM   1. SBO (small bowel obstruction)   2. Dehydration   3. Leukocytosis   Liver lesions/ cysts Vomiting  I personally performed the services described in this documentation, which was scribed in my presence. The recorded information has been reviewed and is accurate.  Clinically GE however mild right abdominal pain and wbc elevation to look for colitis/ diverticulitis or less likely appy. Plan for fluids, zofran and likely CT scan, renal function pending. Difficult, IV fluid bolus in ED, US guided twice by myself. CT showed SBO, liver cysts/ lesions. Spoke with g surgery, will see pt in am, recommended NG tube.  Spoke with [facility], will admit to gen med.  Pt lost another IV, infiltrated fluids.  Nurse found 24 g IV for admission. Vomited 4 times in ED.   The patients results and plan were reviewed and discussed.   Any x-rays performed were personally reviewed by myself.   Differential diagnosis were considered with the presenting HPI. Filed Vitals:   09/17/13 1236 09/17/13 1758  BP: 151/108 141/80  Pulse: 105 80  Temp: 98 F (36.7 C)   TempSrc: Oral   Resp: 16 19  SpO2: 94% 92%    Diagnosis: above  Admission/ observation were discussed with the admitting physician, patient and/or family and they are comfortable with the plan.    Enid Skeens, MD 09/17/13 Barry Brunner  Enid Skeens, MD 09/17/13 [phone]

## 2013-09-17 NOTE — ED Notes (Signed)
MD notified that patient's IV infiltrated and IV has been removed. MD states that patient needs IV in place so she can have her CT done with contrast.

## 2013-09-17 NOTE — ED Notes (Signed)
Daughter reports pt has had v/d since Monday evening.

## 2013-09-18 DIAGNOSIS — F03918 Unspecified dementia, unspecified severity, with other behavioral disturbance: Secondary | ICD-10-CM

## 2013-09-18 DIAGNOSIS — F0391 Unspecified dementia with behavioral disturbance: Secondary | ICD-10-CM

## 2013-09-18 MED ORDER — CHLORHEXIDINE GLUCONATE 0.12 % MT SOLN
15.0000 mL | Freq: Two times a day (BID) | OROMUCOSAL | Status: DC
  Administered 2013-09-18 – 2013-09-26 (×15): 15 mL via OROMUCOSAL
  Filled 2013-09-18 (×15): qty 15

## 2013-09-18 MED ORDER — PHENOL 1.4 % MT LIQD
1.0000 | OROMUCOSAL | Status: DC | PRN
  Administered 2013-09-18: 1 via OROMUCOSAL
  Filled 2013-09-18: qty 177

## 2013-09-18 MED ORDER — METOPROLOL TARTRATE 1 MG/ML IV SOLN
2.5000 mg | Freq: Four times a day (QID) | INTRAVENOUS | Status: DC
  Administered 2013-09-18 – 2013-09-22 (×15): 2.5 mg via INTRAVENOUS
  Filled 2013-09-18 (×16): qty 5

## 2013-09-18 MED ORDER — BIOTENE DRY MOUTH MT LIQD
15.0000 mL | Freq: Two times a day (BID) | OROMUCOSAL | Status: DC
  Administered 2013-09-19 – 2013-09-26 (×15): 15 mL via OROMUCOSAL

## 2013-09-18 NOTE — Consult Note (Signed)
Reason for Consult: Small bowel obstruction Referring Physician: [facility]  [name] is an 80 y.o. female.  HPI: Patient is a 80 year old black female who presented to [facility] with worsening abdominal pain, nausea, vomiting. History is limited as the patient has dementia. A CT scan the abdomen and pelvis was performed which revealed a small bowel obstruction with transition point it seemed to be in the left lower quadrant region. Minimal free fluid was noted. Normal caliber distal small bowel and colon was noted. She's had multiple abdominal surgeries in the past. She currently denies any abdominal pain. An NG tube is in place.  Past Medical History  Diagnosis Date  . Hypertension   . Heart attack   . Parkinson disease   . Cancer     breast  . Alzheimer's dementia     Past Surgical History  Procedure Laterality Date  . Breast surgery    . Abdominal hysterectomy    . Mastectomy    . Small intestine removed    . Tonsillectomy    . Cholecystectomy    . Colon surgery    . Pelvic fracture surgery    . Leg surgery    . Arm surgery    . Hip pinning,cannulated Right 02/01/2013    Procedure: CANNULATED HIP PINNING;  Surgeon: Sanjuana Kava, MD;  Location: [facility];  Service: Orthopedics;  Laterality: Right;    Family History  Problem Relation Age of Onset  . Aneurysm Mother   . Diabetes Father     Social History:  reports that she has never smoked. She has never used smokeless tobacco. She reports that she does not drink alcohol or use illicit drugs.  Allergies:  Allergies  Allergen Reactions  . Penicillins Swelling and Rash    Medications: I have reviewed the patient's current medications.  Results for orders placed during the hospital encounter of 09/17/13 (from the past 48 hour(s))  CBC WITH DIFFERENTIAL     Status: Abnormal   Collection Time    09/17/13  1:38 PM      Result Value Range   WBC 14.9 (*) 4.0 - 10.5 K/uL   RBC 5.95 (*) 3.87 - 5.11  MIL/uL   Hemoglobin 13.8  12.0 - 15.0 g/dL   HCT 42.2  36.0 - 46.0 %   MCV 70.9 (*) 78.0 - 100.0 fL   MCH 23.2 (*) 26.0 - 34.0 pg   MCHC 32.7  30.0 - 36.0 g/dL   RDW 17.8 (*) 11.5 - 15.5 %   Platelets 307  150 - 400 K/uL   Neutrophils Relative % 85 (*) 43 - 77 %   Lymphocytes Relative 7 (*) 12 - 46 %   Monocytes Relative 8  3 - 12 %   Eosinophils Relative 0  0 - 5 %   Basophils Relative 0  0 - 1 %   Neutro Abs 12.7 (*) 1.7 - 7.7 K/uL   Lymphs Abs 1.0  0.7 - 4.0 K/uL   Monocytes Absolute 1.2 (*) 0.1 - 1.0 K/uL   Eosinophils Absolute 0.0  0.0 - 0.7 K/uL   Basophils Absolute 0.0  0.0 - 0.1 K/uL   WBC Morphology ATYPICAL LYMPHOCYTES     Comment: MILD LEFT SHIFT (1-5% METAS, OCC MYELO, OCC BANDS)     VACUOLATED NEUTROPHILS   Smear Review PLATELET COUNT CONFIRMED BY SMEAR     Comment: LARGE PLATELETS PRESENT     GIANT PLATELETS SEEN  BASIC METABOLIC PANEL  Status: Abnormal   Collection Time    09/17/13  1:38 PM      Result Value Range   Sodium 141  137 - 147 mEq/L   Comment: Please note change in reference range.   Potassium 4.5  3.7 - 5.3 mEq/L   Comment: Please note change in reference range.   Chloride 97  96 - 112 mEq/L   CO2 28  19 - 32 mEq/L   Glucose, Bld 127 (*) 70 - 99 mg/dL   BUN 23  6 - 23 mg/dL   Creatinine, Ser 0.83  0.50 - 1.10 mg/dL   Calcium 10.1  8.4 - 10.5 mg/dL   GFR calc non Af Amer 60 (*) >90 mL/min   GFR calc Af Amer 69 (*) >90 mL/min   Comment: (NOTE)     The eGFR has been calculated using the CKD EPI equation.     This calculation has not been validated in all clinical situations.     eGFR's persistently <90 mL/min signify possible Chronic Kidney     Disease.  HEPATIC FUNCTION PANEL     Status: None   Collection Time    09/17/13  1:38 PM      Result Value Range   Total Protein 7.5  6.0 - 8.3 g/dL   Albumin 4.0  3.5 - 5.2 g/dL   AST 23  0 - 37 U/L   ALT 28  0 - 35 U/L   Alkaline Phosphatase 72  39 - 117 U/L   Total Bilirubin 0.7  0.3 - 1.2  mg/dL   Bilirubin, Direct <0.2  0.0 - 0.3 mg/dL   Indirect Bilirubin NOT CALCULATED  0.3 - 0.9 mg/dL  LIPASE, BLOOD     Status: Abnormal   Collection Time    09/17/13  1:38 PM      Result Value Range   Lipase 82 (*) 11 - 59 U/L  URINALYSIS, ROUTINE W REFLEX MICROSCOPIC     Status: None   Collection Time    09/17/13  1:55 PM      Result Value Range   Color, Urine YELLOW  YELLOW   APPearance CLEAR  CLEAR   Specific Gravity, Urine 1.015  1.005 - 1.030   pH 5.0  5.0 - 8.0   Glucose, UA NEGATIVE  NEGATIVE mg/dL   Hgb urine dipstick NEGATIVE  NEGATIVE   Bilirubin Urine NEGATIVE  NEGATIVE   Ketones, ur NEGATIVE  NEGATIVE mg/dL   Protein, ur NEGATIVE  NEGATIVE mg/dL   Urobilinogen, UA 0.2  0.0 - 1.0 mg/dL   Nitrite NEGATIVE  NEGATIVE   Leukocytes, UA NEGATIVE  NEGATIVE   Comment: MICROSCOPIC NOT DONE ON URINES WITH NEGATIVE PROTEIN, BLOOD, LEUKOCYTES, NITRITE, OR GLUCOSE <1000 mg/dL.    Ct Abdomen Pelvis Wo Contrast  09/17/2013   CLINICAL DATA:  Vomiting and abdominal pain. Diarrhea. History of breast cancer.  EXAM: CT ABDOMEN AND PELVIS WITHOUT CONTRAST  TECHNIQUE: Multidetector CT imaging of the abdomen and pelvis was performed following the standard protocol without intravenous contrast.  COMPARISON:  Multiple exams, including 09/17/2013  FINDINGS: Despite efforts by the technologist and patient, motion artifact is present on today's exam and could not be eliminated. This reduces exam sensitivity and specificity. Mild atelectasis suspected in both lower lobes and posteriorly in the right middle lobe. Trace pericardial fluid anteriorly.  Note is again made of multiple hepatic cysts. The largest of these is primarily in the right hepatic lobe but measures 8.5 x 7.4 cm, significantly  enlarged from that reported on the CT abdomen from 06/16/2002. However, the sites the scattered cysts, there also appear to be some dense liver lesions including a 4.8 x 4.6 cm lesion with target cord appearance and  segment 2 and a complex 4.8 x 2.1 cm lesion in segment 3 of the liver. Numerous additional scattered hypodensities in the liver technically too small to characterize.  No splenomegaly.  The contour of the pancreas is unremarkable.  2.2 x 1.2 cm mass of the medial limb left adrenal gland measures 8 Hounsfield units in density, compatible with adenoma.  Hypodense left kidney lower pole lesion, 1.9 cm in diameter, fluid density.  Provided history of cholecystectomy, but a structure very likely to represent the gallbladder is shown along the gallbladder fossa and accordingly I doubt that the patient has actually had cholecystectomy. There are some clips adjacent to The suspected gallbladder.  The colon is nondilated than the patient seems to have a partial right hemicolectomy. There multiple loops of dilated small bowel and multiple loops of nondilated small bowel. The transition point seems to be in the left pelvis where there is frothy material in the small bowel and a sudden transition in bowel caliber. There is a small amount of free pelvic fluid in this vicinity. There is adjacent sigmoid diverticulosis but no compelling findings of active diverticulitis.  Superior endplate compression at T12 noted with anterior wedge compression at L1. The L1 compression fracture has mild posterior bony retropulsion and there is mild grade 1 posterior subluxation at L1-2.  Screws are noted in the right hip. There is deformity in the left pelvis from remote pelvic fractures.  IMPRESSION: 1. Small bowel obstruction, with transition point in the left lower pelvis. Cause for the obstruction is uncertain but the may be adhesion. 2. Complex lesions in the left hepatic lobe could represent metastatic disease or complex cysts. The patient does have a history of polycystic liver disease, with enlargement of some of the cysts, but not of the cysts were reported as complex on the prior CT scan from 2003. 3. Provided history of  cholecystectomy, but a candidate structure for gallbladder suggests that this history is wrong. 4. Trace pericardial effusion. 5. Mild bibasilar subsegmental atelectasis. 6. Superior endplate compression fracture T12 with anterior wedge compression fracture of L1, age indeterminate. 7. Left adrenal adenoma.   Electronically Signed   By: Sherryl Barters M.D.   On: 09/17/2013 18:26   Dg Abd Acute W/chest  09/17/2013   CLINICAL DATA:  Vomiting.  Dehydration.  EXAM: ACUTE ABDOMEN SERIES (ABDOMEN 2 VIEW & CHEST 1 VIEW)  COMPARISON:  One-view chest 04/04/2013  FINDINGS: Mild cardiac enlargement is again noted. There is mild right basilar atelectasis. Mild pulmonary vascular congestion is present without frank edema. No other focal airspace disease is evident. The patient is rotated to the right.  There is mild prominence of a loop of colon extending from the right lower quadrant to the left upper quadrant. Mild gaseous distention of small bowel is evident in the left lower quadrant. Surgical clips are present in the right lower quadrant.  Leftward curvature is present in the lumbar spine. Postoperative changes are noted at the right hip. Remote fractures are present in the left superior and inferior pubic rami.  IMPRESSION: 1. Dilated loop of colon as described with multiple mildly dilated loops of small bowel in the left lower quadrant. This raises the possibility of the developing sigmoid volvulus. There is no evidence for free air. 2. Postsurgical  changes in the right lower quadrant. 3. Stable cardiomegaly without failure. 4. Mild atelectasis at the right lung base.   Electronically Signed   By: Lawrence Santiago M.D.   On: 09/17/2013 15:15    ROS: See chart Blood pressure 175/80, pulse 83, temperature 98.4 F (36.9 C), temperature source Axillary, resp. rate 20, height _0  (1.651 m), weight 55.1 kg (121 lb 7.6 oz), SpO2 96.00%. Physical Exam: Pleasant black female in no acute distress. Abdomen is slightly  distended but soft. No tenderness, hernias, or rigidity are noted. Occasional bowel sounds appreciated.  Assessment/Plan: Impression: Small bowel obstruction most likely secondary to adhesive disease. Patient is at increased risk for complications to undergo surgery. She does not need acute surgical intervention at this time. This was discussed with the patient's power of attorney, who understands and agrees. Would continue NG tube decompression for now. Will reassess daily.  Arn Mcomber A 09/18/2013, 11:55 AM

## 2013-09-18 NOTE — Progress Notes (Signed)
[facility] PROGRESS NOTE  LOREAN EKSTRAND OZD:[id number] DOB: 15-Aug-1920 DOA: 09/17/2013 PCP: [name], MD  Assessment/Plan: 1. Small bowel obstruction: Afebrile, hemodynamics stable. General surgery consult pending. 2. Elevated lipase: Likely secondary to nausea and vomiting. Doubt pancreatitis. 3. History of Parkinson's disease, advanced dementia with chronic behavioral disturbances. These issues apparently at baseline. On Haldol, Celexa, Exelon, Cogentin as outpatient. 4. History coronary artery disease. 5. Polycystic liver disease, seen on abdominal CT 2003. 6. Difficult IV access. 7. Placement.  This 80 year old woman has significant chronic comorbidities including advanced dementia with behavioral disturbance, Parkinson's disease,, total assist with all ADLs, history of coronary artery disease. No recent cardiac symptoms noted although history is unreliable. I explained to the daughter the current issues, the patient is critically ill and may not survive this hospitalization.   Remain n.p.o. Continue IV fluids, NG tube. Surgical consult.  CMP, CBC, lipase in the morning  Obtain midline access  Strict I/O.  Continue Lopressor IV  Pending studies:   none  Code Status: DNR DVT prophylaxis: SCD Family Communication:  Disposition Plan: pending  [name], MD  [facility]  Pager [phone] If 7PM-7AM, please contact night-coverage at [email or web address], password [facility] 09/18/2013, 8:52 AM  LOS: 1 day   Summary: 80 year old we will with history of dementia, Parkinson's disease presented with history of recurrent nausea, vomiting, failure to thrive, poor oral intake with last bowel movement 12/29. Initial evaluation revealed small bowel obstruction. Patient admitted for the same, surgical consult.  Consultants:  General surgery  Procedures:    Antibiotics:    HPI/Subjective: Patient removed NG tube overnight. Patient has dementia, no history  provided.  Objective: Filed Vitals:   09/17/13 1957 09/17/13 2041 09/17/13 2317 09/18/13 0512  BP: 127/73  173/97 175/80  Pulse: 79 96 97 83  Temp: 98.6 F (37 C)  98.2 F (36.8 C) 98.4 F (36.9 C)  TempSrc: Oral  Axillary Axillary  Resp: 20 23 21 20   Height:   5\' 5"  (1.651 m)   Weight:   55.1 kg (121 lb 7.6 oz)   SpO2: 99% 95% 97% 96%    Intake/Output Summary (Last 24 hours) at 09/18/13 0852 Last data filed at 09/17/13 2346  Gross per 24 hour  Intake      0 ml  Output     13 ml  Net    -13 ml     Filed Weights   09/17/13 2317  Weight: 55.1 kg (121 lb 7.6 oz)    Exam:   Afebrile, hypertensive, vitals otherwise stable. No hypoxia.  General: Appears calm, comfortable.  Cardiovascular: Regular rate and rhythm. No murmur, rub or gallop. No lower extremity edema. Bilateral feet warm and dry.  Respiratory: Clear to auscultation bilaterally. No wheezes, rales or rhonchi. Normal respiratory effort.  Abdomen: Somewhat firm, nondistended, decreased bowel sounds. Not clearly tender.  Skin: No rash or induration seen.  Eyes: Pupils equal, round, react to light. Normal lids, irises.  ENT: NG tube in place.  Data Reviewed:  Complete metabolic panel unremarkable, normal kidney function on admission.  Lipase 82 chest x-ray unremarkable on admission  CT of the abdomen and pelvis: Small bowel obstruction.  Complex liver lesions with history of polycystic liver disease  Scheduled Meds: . enoxaparin (LOVENOX) injection  40 mg Subcutaneous Q24H  . pantoprazole (PROTONIX) IV  40 mg Intravenous Q24H  . rivastigmine  9.5 mg Transdermal Daily   Continuous Infusions: . sodium chloride 75 mL/hr at 09/17/13 2152    Principal Problem:  SBO (small bowel obstruction) Active Problems:   Dementia   Parkinson disease   Lesion of liver   Time spent 25 minutes

## 2013-09-18 NOTE — Progress Notes (Signed)
Pt removed NG tube while restrained.  Page mid level who stated to leave NG tube out for now unless patient began vomiting again.

## 2013-09-18 NOTE — Progress Notes (Signed)
Utilization Review Complete  

## 2013-09-19 ENCOUNTER — Encounter (HOSPITAL_COMMUNITY): Payer: PRIVATE HEALTH INSURANCE

## 2013-09-19 LAB — COMPREHENSIVE METABOLIC PANEL
ALT: 10 U/L (ref 0–35)
AST: 12 U/L (ref 0–37)
Albumin: 2.8 g/dL — ABNORMAL LOW (ref 3.5–5.2)
Alkaline Phosphatase: 55 U/L (ref 39–117)
BUN: 36 mg/dL — ABNORMAL HIGH (ref 6–23)
CO2: 27 mEq/L (ref 19–32)
Calcium: 8.9 mg/dL (ref 8.4–10.5)
Chloride: 108 mEq/L (ref 96–112)
Creatinine, Ser: 0.99 mg/dL (ref 0.50–1.10)
GFR calc Af Amer: 56 mL/min — ABNORMAL LOW (ref >90)
GFR calc non Af Amer: 48 mL/min — ABNORMAL LOW (ref >90)
Glucose, Bld: 135 mg/dL — ABNORMAL HIGH (ref 70–99)
Potassium: 3.7 mEq/L (ref 3.7–5.3)
Sodium: 146 mEq/L (ref 137–147)
Total Bilirubin: 0.6 mg/dL (ref 0.3–1.2)
Total Protein: 7.3 g/dL (ref 6.0–8.3)

## 2013-09-19 LAB — LIPASE, BLOOD: Lipase: 13 U/L (ref 11–59)

## 2013-09-19 LAB — CBC
HCT: 40.8 % (ref 36.0–46.0)
Hemoglobin: 13.4 g/dL (ref 12.0–15.0)
MCH: 23.4 pg — ABNORMAL LOW (ref 26.0–34.0)
MCHC: 32.8 g/dL (ref 30.0–36.0)
MCV: 71.2 fL — ABNORMAL LOW (ref 78.0–100.0)
Platelets: 308 10*3/uL (ref 150–400)
RBC: 5.73 MIL/uL — ABNORMAL HIGH (ref 3.87–5.11)
RDW: 17.7 % — ABNORMAL HIGH (ref 11.5–15.5)
WBC: 9.6 10*3/uL (ref 4.0–10.5)

## 2013-09-19 MED ORDER — BISACODYL 10 MG RE SUPP
10.0000 mg | Freq: Every morning | RECTAL | Status: DC
  Administered 2013-09-19 – 2013-09-22 (×4): 10 mg via RECTAL
  Filled 2013-09-19 (×5): qty 1

## 2013-09-19 MED ORDER — SODIUM CHLORIDE 0.9 % IJ SOLN
10.0000 mL | Freq: Two times a day (BID) | INTRAMUSCULAR | Status: DC
  Administered 2013-09-19 – 2013-09-21 (×6): 10 mL
  Administered 2013-09-22: 20 mL
  Administered 2013-09-22 – 2013-09-26 (×7): 10 mL

## 2013-09-19 MED ORDER — SODIUM CHLORIDE 0.9 % IJ SOLN
10.0000 mL | INTRAMUSCULAR | Status: DC | PRN
  Administered 2013-09-19: 30 mL

## 2013-09-19 NOTE — Progress Notes (Signed)
Subjective: Nonspecific abdominal pain noted.  Objective: Vital signs in last 24 hours: Temp:  [97.3 F (36.3 C)-98.3 F (36.8 C)] 98.3 F (36.8 C) (01/02 0637) Pulse Rate:  [74-95] 86 (01/02 0637) Resp:  [20] 20 (01/02 0637) BP: (117-146)/(78-90) 123/82 mmHg (01/02 0637) SpO2:  [94 %-97 %] 95 % (01/02 0637) Last BM Date: 09/17/13  Intake/Output from previous day: 01/01 0701 - 01/02 0700 In: 2235 [I.V.:1285; NG/GT:950] Out: 1000 [Emesis/NG output:1000] Intake/Output this shift:    GI: Mildly distended. No specific tenderness noted. Minimal bowel sounds appreciated.  Lab Results:   Recent Labs  09/17/13 1338 09/19/13 0504  WBC 14.9* 9.6  HGB 13.8 13.4  HCT 42.2 40.8  PLT 307 308   BMET  Recent Labs  09/17/13 1338 09/19/13 0504  NA 141 146  K 4.5 3.7  CL 97 108  CO2 28 27  GLUCOSE 127* 135*  BUN 23 36*  CREATININE 0.83 0.99  CALCIUM 10.1 8.9   PT/INR No results found for this basename: LABPROT, INR,  in the last 72 hours  Studies/Results: Ct Abdomen Pelvis Wo Contrast  09/17/2013   CLINICAL DATA:  Vomiting and abdominal pain. Diarrhea. History of breast cancer.  EXAM: CT ABDOMEN AND PELVIS WITHOUT CONTRAST  TECHNIQUE: Multidetector CT imaging of the abdomen and pelvis was performed following the standard protocol without intravenous contrast.  COMPARISON:  Multiple exams, including 09/17/2013  FINDINGS: Despite efforts by the technologist and patient, motion artifact is present on today's exam and could not be eliminated. This reduces exam sensitivity and specificity. Mild atelectasis suspected in both lower lobes and posteriorly in the right middle lobe. Trace pericardial fluid anteriorly.  Note is again made of multiple hepatic cysts. The largest of these is primarily in the right hepatic lobe but measures 8.5 x 7.4 cm, significantly enlarged from that reported on the CT abdomen from 06/16/2002. However, the sites the scattered cysts, there also appear to  be some dense liver lesions including a 4.8 x 4.6 cm lesion with target cord appearance and segment 2 and a complex 4.8 x 2.1 cm lesion in segment 3 of the liver. Numerous additional scattered hypodensities in the liver technically too small to characterize.  No splenomegaly.  The contour of the pancreas is unremarkable.  2.2 x 1.2 cm mass of the medial limb left adrenal gland measures 8 Hounsfield units in density, compatible with adenoma.  Hypodense left kidney lower pole lesion, 1.9 cm in diameter, fluid density.  Provided history of cholecystectomy, but a structure very likely to represent the gallbladder is shown along the gallbladder fossa and accordingly I doubt that the patient has actually had cholecystectomy. There are some clips adjacent to The suspected gallbladder.  The colon is nondilated than the patient seems to have a partial right hemicolectomy. There multiple loops of dilated small bowel and multiple loops of nondilated small bowel. The transition point seems to be in the left pelvis where there is frothy material in the small bowel and a sudden transition in bowel caliber. There is a small amount of free pelvic fluid in this vicinity. There is adjacent sigmoid diverticulosis but no compelling findings of active diverticulitis.  Superior endplate compression at T12 noted with anterior wedge compression at L1. The L1 compression fracture has mild posterior bony retropulsion and there is mild grade 1 posterior subluxation at L1-2.  Screws are noted in the right hip. There is deformity in the left pelvis from remote pelvic fractures.  IMPRESSION: 1. Small bowel obstruction,  with transition point in the left lower pelvis. Cause for the obstruction is uncertain but the may be adhesion. 2. Complex lesions in the left hepatic lobe could represent metastatic disease or complex cysts. The patient does have a history of polycystic liver disease, with enlargement of some of the cysts, but not of the cysts  were reported as complex on the prior CT scan from 2003. 3. Provided history of cholecystectomy, but a candidate structure for gallbladder suggests that this history is wrong. 4. Trace pericardial effusion. 5. Mild bibasilar subsegmental atelectasis. 6. Superior endplate compression fracture T12 with anterior wedge compression fracture of L1, age indeterminate. 7. Left adrenal adenoma.   Electronically Signed   By: Sherryl Barters M.D.   On: 09/17/2013 18:26   Dg Abd Acute W/chest  09/17/2013   CLINICAL DATA:  Vomiting.  Dehydration.  EXAM: ACUTE ABDOMEN SERIES (ABDOMEN 2 VIEW & CHEST 1 VIEW)  COMPARISON:  One-view chest 04/04/2013  FINDINGS: Mild cardiac enlargement is again noted. There is mild right basilar atelectasis. Mild pulmonary vascular congestion is present without frank edema. No other focal airspace disease is evident. The patient is rotated to the right.  There is mild prominence of a loop of colon extending from the right lower quadrant to the left upper quadrant. Mild gaseous distention of small bowel is evident in the left lower quadrant. Surgical clips are present in the right lower quadrant.  Leftward curvature is present in the lumbar spine. Postoperative changes are noted at the right hip. Remote fractures are present in the left superior and inferior pubic rami.  IMPRESSION: 1. Dilated loop of colon as described with multiple mildly dilated loops of small bowel in the left lower quadrant. This raises the possibility of the developing sigmoid volvulus. There is no evidence for free air. 2. Postsurgical changes in the right lower quadrant. 3. Stable cardiomegaly without failure. 4. Mild atelectasis at the right lung base.   Electronically Signed   By: Lawrence Santiago M.D.   On: 09/17/2013 15:15    Anti-infectives: Anti-infectives   None      Assessment/Plan: Impression: Small bowel traction, unchanged from yesterday. She has had over 1 L of NG tube output. We will try Dulcolax  suppository today. No need for acute surgical intervention at this time. We'll continue to follow expectantly.  LOS: 2 days    Bunyan Brier A 09/19/2013

## 2013-09-19 NOTE — Progress Notes (Signed)
[facility] PROGRESS NOTE  [name] TGG:[id number] DOB: 04-Jan-1920 DOA: 09/17/2013 PCP: [name], MD  Assessment/Plan: 1. Small bowel obstruction: remains afebrile and hemodynamically stable. Continue management per general surgery. 2. Dehydration: Secondary to high NG tube output. 3. Elevated lipase, normal today. Initial finding likely secondary to acute illness. 4. History of Parkinson's disease, advanced dementia with chronic behavioral disturbances. Appears to be at baseline. Resume Celexa, Cogentin when improved. 5. History of coronary artery disease   Management of small bowel obstruction per surgery. Abdominal film in the morning. Continue NG tube.  Increase IV fluid rate.  Echocardiogram    CBC and basic metabolic panel the morning    patient remains critically ill, survival in doubt. Long discussion with multiple family members including daughter, we discussed critical illness, guarded prognosis.  Pending studies:   none  Code Status: DNR DVT prophylaxis: SCD Family Communication:  Disposition Plan: pending  [name], MD  [facility]  Pager [phone] If 7PM-7AM, please contact night-coverage at [email or web address], password [facility] 09/19/2013, 5:19 PM  LOS: 2 days   Summary: 80 year old we will with history of dementia, Parkinson's disease presented with history of recurrent nausea, vomiting, failure to thrive, poor oral intake with last bowel movement 12/29. Initial evaluation revealed small bowel obstruction. Patient admitted for the same, surgical consult.  Consultants:  General surgery  Procedures:    Antibiotics:    HPI/Subjective: Less responsive today, but talking to daughter earlier.  Objective: Filed Vitals:   09/18/13 1446 09/18/13 1821 09/18/13 2024 09/19/13 0637  BP: 146/90 117/78 134/88 123/82  Pulse: 74  95 86  Temp: 97.3 F (36.3 C)  97.6 F (36.4 C) 98.3 F (36.8 C)  TempSrc: Oral  Axillary Axillary  Resp: 20   20 20   Height:      Weight:      SpO2: 94%  97% 95%    Intake/Output Summary (Last 24 hours) at 09/19/13 1719 Last data filed at 09/19/13 1248  Gross per 24 hour  Intake    980 ml  Output      0 ml  Net    980 ml     Filed Weights   09/17/13 2317  Weight: 55.1 kg (121 lb 7.6 oz)    Exam:   Afebrile, vital signs stable. No hypoxia.  General: Appears calm and mildly uncomfortable. Appears ill.  CV: RRR, no r/g; 3/6 systolic murmur; no lower extremity edema. Feet warm and dry.  Respiratory: Clear to auscultation bilaterally. No wheezes, rales or rhonchi. Normal respiratory effort.  Abdomen somewhat firm, nondistended, nontender   psychiatric cannot assess  Data Reviewed:  Urine output 1000 mL  Basic metabolic panel notable for elevated BUN of 36  Leukocytosis has resolved, 9.6. Hemoglobin stable. 13.4. Platelet count stable.  Scheduled Meds: . antiseptic oral rinse  15 mL Mouth Rinse q12n4p  . bisacodyl  10 mg Rectal q morning - 10a  . chlorhexidine  15 mL Mouth Rinse BID  . metoprolol  2.5 mg Intravenous Q6H  . pantoprazole (PROTONIX) IV  40 mg Intravenous Q24H  . rivastigmine  9.5 mg Transdermal Daily  . sodium chloride  10-40 mL Intracatheter Q12H   Continuous Infusions: . sodium chloride 75 mL/hr at 09/18/13 1325    Principal Problem:   SBO (small bowel obstruction) Active Problems:   Dementia   Parkinson disease   Lesion of liver   Time spent 25 minutes

## 2013-09-19 NOTE — Progress Notes (Signed)
MD stated that it was okay to renew the restraint order due to the patient being at risk of pulling out IV lines and/or NGT.

## 2013-09-20 DIAGNOSIS — I059 Rheumatic mitral valve disease, unspecified: Secondary | ICD-10-CM

## 2013-09-20 LAB — CBC
HCT: 40.5 % (ref 36.0–46.0)
Hemoglobin: 12.7 g/dL (ref 12.0–15.0)
MCH: 22.4 pg — ABNORMAL LOW (ref 26.0–34.0)
MCHC: 31.4 g/dL (ref 30.0–36.0)
MCV: 71.6 fL — ABNORMAL LOW (ref 78.0–100.0)
Platelets: 249 10*3/uL (ref 150–400)
RBC: 5.66 MIL/uL — ABNORMAL HIGH (ref 3.87–5.11)
RDW: 17.6 % — ABNORMAL HIGH (ref 11.5–15.5)
WBC: 8.5 10*3/uL (ref 4.0–10.5)

## 2013-09-20 LAB — BASIC METABOLIC PANEL
BUN: 37 mg/dL — ABNORMAL HIGH (ref 6–23)
CO2: 27 mEq/L (ref 19–32)
Calcium: 8.7 mg/dL (ref 8.4–10.5)
Chloride: 114 mEq/L — ABNORMAL HIGH (ref 96–112)
Creatinine, Ser: 0.92 mg/dL (ref 0.50–1.10)
GFR calc Af Amer: 61 mL/min — ABNORMAL LOW (ref >90)
GFR calc non Af Amer: 53 mL/min — ABNORMAL LOW (ref >90)
Glucose, Bld: 127 mg/dL — ABNORMAL HIGH (ref 70–99)
Potassium: 3.3 mEq/L — ABNORMAL LOW (ref 3.7–5.3)
Sodium: 151 mEq/L — ABNORMAL HIGH (ref 137–147)

## 2013-09-20 MED ORDER — MILK AND MOLASSES ENEMA
1.0000 | Freq: Once | RECTAL | Status: AC
  Administered 2013-09-20: 250 mL via RECTAL

## 2013-09-20 MED ORDER — DEXTROSE 5 % IV SOLN
INTRAVENOUS | Status: DC
  Administered 2013-09-20: 100 mL via INTRAVENOUS
  Administered 2013-09-21 – 2013-09-22 (×2): 1000 mL via INTRAVENOUS

## 2013-09-20 MED ORDER — POTASSIUM CHLORIDE 10 MEQ/100ML IV SOLN
10.0000 meq | INTRAVENOUS | Status: AC
  Administered 2013-09-20 (×3): 10 meq via INTRAVENOUS
  Filled 2013-09-20: qty 100

## 2013-09-20 NOTE — Progress Notes (Signed)
Bladder scan 175 ml. Pt's fluids running at 150 ml/hr. Nonpitting edema noted at this time to lower extremities. Pt's bed is moderately wet. Order given to insert foley catheter. Will continue to monitor.

## 2013-09-20 NOTE — Progress Notes (Signed)
Notified Dr. Arnoldo Morale of the patient continuing to vomit stool smelling emesis.  New orders placed.

## 2013-09-20 NOTE — Progress Notes (Signed)
  Subjective: Noncommunicative.  Objective: Vital signs in last 24 hours: Temp:  [98.2 F (36.8 C)-98.9 F (37.2 C)] 98.3 F (36.8 C) (01/03 0519) Pulse Rate:  [90-101] 90 (01/03 0519) Resp:  [20] 20 (01/02 2301) BP: (108-170)/(65-78) 170/65 mmHg (01/03 0519) SpO2:  [94 %-96 %] 96 % (01/02 2301) Last BM Date: 10/18/13  Intake/Output from previous day: 01/02 0701 - 01/03 0700 In: 3710 [I.V.:3710] Out: 700 [Urine:200; Emesis/NG output:500] Intake/Output this shift:    GI: Soft, not particularly distended. Minimal bowel sounds appreciated.  Lab Results:   Recent Labs  09/19/13 0504 09/20/13 0610  WBC 9.6 8.5  HGB 13.4 12.7  HCT 40.8 40.5  PLT 308 249   BMET  Recent Labs  09/19/13 0504 09/20/13 0610  NA 146 151*  K 3.7 3.3*  CL 108 114*  CO2 27 27  GLUCOSE 135* 127*  BUN 36* 37*  CREATININE 0.99 0.92  CALCIUM 8.9 8.7   PT/INR No results found for this basename: LABPROT, INR,  in the last 72 hours  Studies/Results: No results found.  Anti-infectives: Anti-infectives   None      Assessment/Plan: Impression: Small bowel obstruction, has not fully resolved. Patient has hypokalemia as well as hypernatremia. 2-D echo was performed, results pending. Will try molasses enema.  LOS: 3 days    Lucien Budney A 09/20/2013

## 2013-09-20 NOTE — Progress Notes (Addendum)
[facility] PROGRESS NOTE  [name] PJK:[id number] DOB: [birth year] DOA: 09/17/2013 PCP: [name], MD  Summary: 80 year old woman with history of dementia, Parkinson's disease presented with history of recurrent nausea, vomiting, failure to thrive, poor oral intake with last bowel movement 12/29. Initial evaluation revealed small bowel obstruction. Patient admitted for the same, surgical consult. There has been no clinical change in her condition, plan repeat abdominal x-ray in the morning, management per surgery. Prognosis is guarded.  Assessment/Plan: 1. Small bowel obstruction: No clinical improvement. Management per general surgery. Continues to have significant NG tube output. 2. Dehydration: Secondary to high NG tube output. 3. History of Parkinson's disease, advanced dementia with chronic behavioral disturbances. Appears to be at baseline. Resume Celexa, Cogentin when improved. 4. History of coronary artery disease 5. Murmur--echocardiogram pending   Management of SBO per surgery  Adjust IVF for dehydration  CBC and BMP in AM  AXR in AM  Updated daughter at bedside. We reviewed current issues and again discussed the patient is critically ill and may not survive this hospitalization.  Code Status: DNR DVT prophylaxis: SCD Family Communication:  Disposition Plan: pending  Samantha Hodgkins, MD  [facility]  Pager [phone] If 7PM-7AM, please contact night-coverage at [email or web address], password [email or web address] 09/20/2013, 1:28 PM  LOS: 3 days   Consultants:  General surgery  Procedures:    Antibiotics:    HPI/Subjective: More responsive today, better per daughter at bedside. Had some vomiting this morning. Patient provides no history.  Objective: Filed Vitals:   09/19/13 1430 09/19/13 1743 09/19/13 2301 09/20/13 0519  BP: 108/69  135/78 170/65  Pulse: 101  93 90  Temp: 98.2 F (36.8 C) 98.2 F (36.8 C) 98.9 F (37.2 C) 98.3 F (36.8 C)  TempSrc:  Axillary Axillary Axillary Axillary  Resp: 20  20   Height:      Weight:      SpO2: 94%  96%     Intake/Output Summary (Last 24 hours) at 09/20/13 1328 Last data filed at 09/20/13 0521  Gross per 24 hour  Intake   3680 ml  Output    700 ml  Net   2980 ml     Filed Weights   09/17/13 2317  Weight: 55.1 kg (121 lb 7.6 oz)    Exam:   Afebrile, vital signs stable. No hypoxia.  General: appears calm, sleeping, non-toxic but appears chronically ill and weak  CV: RRR 2/6 systolic murmur, no m/r/g  Resp: CTA bilaterally no w/r/r  Abd softer, ntnd  Skin no rash or induration  Psych: cannot assess  Neurologic: cannot assess  Data Reviewed:  NG tube output 500  Urine output and completely measured  Sodium 131, potassium 3.1, chloride 114. Creatinine stable 0.92  No leukocytosis. Hemoglobin and platelet count stable.  Scheduled Meds: . antiseptic oral rinse  15 mL Mouth Rinse q12n4p  . bisacodyl  10 mg Rectal q morning - 10a  . chlorhexidine  15 mL Mouth Rinse BID  . metoprolol  2.5 mg Intravenous Q6H  . milk and molasses  1 enema Rectal Once  . pantoprazole (PROTONIX) IV  40 mg Intravenous Q24H  . rivastigmine  9.5 mg Transdermal Daily  . sodium chloride  10-40 mL Intracatheter Q12H   Continuous Infusions: . dextrose      Principal Problem:   SBO (small bowel obstruction) Active Problems:   Dementia   Parkinson disease   Lesion of liver   Time spent 20 minutes

## 2013-09-20 NOTE — Progress Notes (Signed)
Pt's midline on left side will not draw blood. She has had a mastectomy on the right side. Lab needs to draw blood. MD made aware. Dr. Clide Deutscher states that lab may draw blood from wherever they need to.

## 2013-09-20 NOTE — Progress Notes (Signed)
*  PRELIMINARY RESULTS* Echocardiogram 2D Echocardiogram has been performed.  Tera Partridge 09/20/2013, 12:56 PM

## 2013-09-21 ENCOUNTER — Inpatient Hospital Stay (HOSPITAL_COMMUNITY): Payer: PRIVATE HEALTH INSURANCE

## 2013-09-21 DIAGNOSIS — G20 Parkinson's disease: Secondary | ICD-10-CM

## 2013-09-21 LAB — BASIC METABOLIC PANEL
BUN: 27 mg/dL — ABNORMAL HIGH (ref 6–23)
CO2: 25 mEq/L (ref 19–32)
Calcium: 8.6 mg/dL (ref 8.4–10.5)
Chloride: 112 mEq/L (ref 96–112)
Creatinine, Ser: 0.83 mg/dL (ref 0.50–1.10)
GFR calc Af Amer: 69 mL/min — ABNORMAL LOW (ref >90)
GFR calc non Af Amer: 60 mL/min — ABNORMAL LOW (ref >90)
Glucose, Bld: 139 mg/dL — ABNORMAL HIGH (ref 70–99)
Potassium: 3.4 mEq/L — ABNORMAL LOW (ref 3.7–5.3)
Sodium: 148 mEq/L — ABNORMAL HIGH (ref 137–147)

## 2013-09-21 LAB — CBC
HCT: 38.3 % (ref 36.0–46.0)
Hemoglobin: 12.1 g/dL (ref 12.0–15.0)
MCH: 22.5 pg — ABNORMAL LOW (ref 26.0–34.0)
MCHC: 31.6 g/dL (ref 30.0–36.0)
MCV: 71.3 fL — ABNORMAL LOW (ref 78.0–100.0)
Platelets: 252 10*3/uL (ref 150–400)
RBC: 5.37 MIL/uL — ABNORMAL HIGH (ref 3.87–5.11)
RDW: 17.4 % — ABNORMAL HIGH (ref 11.5–15.5)
WBC: 8.8 10*3/uL (ref 4.0–10.5)

## 2013-09-21 LAB — MAGNESIUM: Magnesium: 2.6 mg/dL — ABNORMAL HIGH (ref 1.5–2.5)

## 2013-09-21 LAB — PHOSPHORUS: Phosphorus: 1.3 mg/dL — ABNORMAL LOW (ref 2.3–4.6)

## 2013-09-21 MED ORDER — POTASSIUM CHLORIDE CRYS ER 20 MEQ PO TBCR
40.0000 meq | EXTENDED_RELEASE_TABLET | Freq: Once | ORAL | Status: AC
  Administered 2013-09-21: 40 meq via ORAL
  Filled 2013-09-21: qty 2

## 2013-09-21 NOTE — Progress Notes (Signed)
Pt could not tolerate the food on the dysphagia 3 diet.  She spit the food back up.  Family request to go back to full liquid. Orders placed.

## 2013-09-21 NOTE — Progress Notes (Signed)
New order was given for soft wrist restraint by Dr. Sarajane Jews 09-20-13 at 1600.  Discussed with Dr. Roderic Palau release of the restraints today since the patient no longer had the NGT.  I voiced to him I would place the mittens since she still has a midline and at risk to pull it out. The restraints were discontinued 09/21/2013 at 1350.  Explained the release with the family and they verbalized understanding.

## 2013-09-21 NOTE — Progress Notes (Signed)
Advanced patients diet per order to Dysphagia 3.  Patient had about 75% of her breakfast and about 50% of her lunch.  She tolerated the meals well as evidence of the patient did not vomit and the patients family stated that she did not have any complaints after consuming the meal.  I will continue to monitor her.

## 2013-09-21 NOTE — Progress Notes (Signed)
[facility] PROGRESS NOTE  [name] OZD:[id number] DOB: 12-26-1919 DOA: 09/17/2013 PCP: [name], MD  Summary: 80 year old woman with history of dementia, Parkinson's disease presented with history of recurrent nausea, vomiting, failure to thrive, poor oral intake with last bowel movement 12/29. Initial evaluation revealed small bowel obstruction. Patient admitted for the same, surgical consult. There has been no clinical change in her condition, plan repeat abdominal x-ray in the morning, management per surgery. Prognosis is guarded.  Assessment/Plan: 1. Small bowel obstruction: No clinical improvement. Management per general surgery. NG tube discontinued today and started on diet.  Advance to soft diet as tolerated. 2. Dehydration: Secondary to high NG tube output. Continue IV fluids for now 3. History of Parkinson's disease, advanced dementia with chronic behavioral disturbances. Appears to be at baseline. Resume Celexa, Cogentin when improved. 4. History of coronary artery disease 5. Murmur--echo cardiogram shows some aortic valve sclerosis and hypertrophic/hypertensive cardiomyopathy.  Code Status: DNR DVT prophylaxis: SCD Family Communication: discussed with family at the bedside Disposition Plan: possible discharge home with services in the next 24 hours  Kathie Dike, MD  [facility]  Pager [phone] If 7PM-7AM, please contact night-coverage at [email or web address], password [email or web address] 09/21/2013, 1:55 PM  LOS: 4 days   Consultants:  General surgery  Procedures:    Antibiotics:    HPI/Subjective: NGT discontinued today.  Patient tolerated liquids.  Moving bowels and passing flatus  Objective: Filed Vitals:   09/20/13 0519 09/20/13 1417 09/20/13 2150 09/21/13 0557  BP: 170/65 126/93 154/57 142/63  Pulse: 90 90 80 73  Temp: 98.3 F (36.8 C) 98 F (36.7 C) 98.1 F (36.7 C) 98.5 F (36.9 C)  TempSrc: Axillary Oral Axillary Axillary  Resp:  20 20 19    Height:      Weight:      SpO2:  96% 96% 94%    Intake/Output Summary (Last 24 hours) at 09/21/13 1355 Last data filed at 09/21/13 1339  Gross per 24 hour  Intake    600 ml  Output    400 ml  Net    200 ml     Filed Weights   09/17/13 2317  Weight: 55.1 kg (121 lb 7.6 oz)    Exam:   Afebrile, vital signs stable. No hypoxia.  General: appears calm, awake, non-toxic but appears chronically ill and weak  CV: RRR 2/6 systolic murmur, no m/r/g  Resp: CTA bilaterally no w/r/r  Abd softer, ntnd, bs+  Skin no rash or induration  Psych: answering questions  Neurologic: cannot assess  Scheduled Meds: . antiseptic oral rinse  15 mL Mouth Rinse q12n4p  . bisacodyl  10 mg Rectal q morning - 10a  . chlorhexidine  15 mL Mouth Rinse BID  . metoprolol  2.5 mg Intravenous Q6H  . pantoprazole (PROTONIX) IV  40 mg Intravenous Q24H  . rivastigmine  9.5 mg Transdermal Daily  . sodium chloride  10-40 mL Intracatheter Q12H   Continuous Infusions: . dextrose 1,000 mL (09/21/13 0321)    Principal Problem:   SBO (small bowel obstruction) Active Problems:   Dementia   Parkinson disease   Lesion of liver   Time spent 25 minutes

## 2013-09-21 NOTE — Progress Notes (Signed)
  Subjective: Has had several large bowel movements. NG tube output decreased. Patient resting comfortably.  Objective: Vital signs in last 24 hours: Temp:  [98 F (36.7 C)-98.5 F (36.9 C)] 98.5 F (36.9 C) (01/04 0557) Pulse Rate:  [73-90] 73 (01/04 0557) Resp:  [19-20] 19 (01/04 0557) BP: (126-154)/(57-93) 142/63 mmHg (01/04 0557) SpO2:  [94 %-96 %] 94 % (01/04 0557) Last BM Date: 09/20/13  Intake/Output from previous day: 01/03 0701 - 01/04 0700 In: 420 [I.V.:20; IV Piggyback:400] Out: 400 [Emesis/NG output:400] Intake/Output this shift:    GI: soft, non-tender; bowel sounds normal; no masses,  no organomegaly  Lab Results:   Recent Labs  09/20/13 0610 09/21/13 0554  WBC 8.5 8.8  HGB 12.7 12.1  HCT 40.5 38.3  PLT 249 252   BMET  Recent Labs  09/20/13 0610 09/21/13 0554  NA 151* 148*  K 3.3* 3.4*  CL 114* 112  CO2 27 25  GLUCOSE 127* 139*  BUN 37* 27*  CREATININE 0.92 0.83  CALCIUM 8.7 8.6   PT/INR No results found for this basename: LABPROT, INR,  in the last 72 hours  Studies/Results: No results found.  Anti-infectives: Anti-infectives   None      Assessment/Plan: Impression: Small bowel obstruction, resolving. Plan: Will remove NG tube and advance diet as tolerated. No need for surgical intervention at this point.  LOS: 4 days    Jeb Schloemer A 09/21/2013

## 2013-09-22 ENCOUNTER — Inpatient Hospital Stay (HOSPITAL_COMMUNITY): Payer: PRIVATE HEALTH INSURANCE

## 2013-09-22 DIAGNOSIS — R1110 Vomiting, unspecified: Secondary | ICD-10-CM

## 2013-09-22 LAB — BASIC METABOLIC PANEL
BUN: 15 mg/dL (ref 6–23)
CO2: 21 mEq/L (ref 19–32)
Calcium: 8.3 mg/dL — ABNORMAL LOW (ref 8.4–10.5)
Chloride: 110 mEq/L (ref 96–112)
Creatinine, Ser: 0.72 mg/dL (ref 0.50–1.10)
GFR calc Af Amer: 84 mL/min — ABNORMAL LOW (ref >90)
GFR calc non Af Amer: 73 mL/min — ABNORMAL LOW (ref >90)
Glucose, Bld: 124 mg/dL — ABNORMAL HIGH (ref 70–99)
Potassium: 3.5 mEq/L — ABNORMAL LOW (ref 3.7–5.3)
Sodium: 141 mEq/L (ref 137–147)

## 2013-09-22 LAB — CBC
HCT: 37.2 % (ref 36.0–46.0)
Hemoglobin: 12.1 g/dL (ref 12.0–15.0)
MCH: 23 pg — ABNORMAL LOW (ref 26.0–34.0)
MCHC: 32.5 g/dL (ref 30.0–36.0)
MCV: 70.7 fL — ABNORMAL LOW (ref 78.0–100.0)
Platelets: 332 10*3/uL (ref 150–400)
RBC: 5.26 MIL/uL — ABNORMAL HIGH (ref 3.87–5.11)
RDW: 17.2 % — ABNORMAL HIGH (ref 11.5–15.5)
WBC: 11.5 10*3/uL — ABNORMAL HIGH (ref 4.0–10.5)

## 2013-09-22 MED ORDER — ALPRAZOLAM 0.5 MG PO TABS
0.5000 mg | ORAL_TABLET | Freq: Every day | ORAL | Status: DC
  Administered 2013-09-23 – 2013-09-26 (×4): 0.5 mg via ORAL
  Filled 2013-09-22 (×4): qty 1

## 2013-09-22 MED ORDER — METOPROLOL TARTRATE 25 MG PO TABS
25.0000 mg | ORAL_TABLET | Freq: Two times a day (BID) | ORAL | Status: DC
  Administered 2013-09-22 – 2013-09-26 (×8): 25 mg via ORAL
  Filled 2013-09-22 (×8): qty 1

## 2013-09-22 MED ORDER — METOPROLOL TARTRATE 1 MG/ML IV SOLN
2.5000 mg | Freq: Once | INTRAVENOUS | Status: AC
  Administered 2013-09-22: 2.5 mg via INTRAVENOUS

## 2013-09-22 NOTE — Progress Notes (Signed)
Subjective: Only tolerated for liquid diet. Still having multiple bowel movements. May have difficulties eating more solid food.  Objective: Vital signs in last 24 hours: Temp:  [98.4 F (36.9 C)-98.8 F (37.1 C)] 98.8 F (37.1 C) (01/05 0653) Pulse Rate:  [81-90] 81 (01/05 0653) Resp:  [19-20] 19 (01/05 0653) BP: (112-137)/(60-88) 136/88 mmHg (01/05 0653) SpO2:  [93 %-98 %] 98 % (01/05 0653) Last BM Date: 09/21/13  Intake/Output from previous day: 01/04 0701 - 01/05 0700 In: 180 [P.O.:180] Out: -  Intake/Output this shift:    GI: soft, non-tender; bowel sounds normal; no masses,  no organomegaly  Lab Results:   Recent Labs  09/21/13 0554 09/22/13 0455  WBC 8.8 11.5*  HGB 12.1 12.1  HCT 38.3 37.2  PLT 252 332   BMET  Recent Labs  09/21/13 0554 09/22/13 0455  NA 148* 141  K 3.4* 3.5*  CL 112 110  CO2 25 21  GLUCOSE 139* 124*  BUN 27* 15  CREATININE 0.83 0.72  CALCIUM 8.6 8.3*   PT/INR No results found for this basename: LABPROT, INR,  in the last 72 hours  Studies/Results: US Venous Img Upper Uni Right  09/22/2013   CLINICAL DATA:  Right arm swelling  EXAM: Right UPPER EXTREMITY VENOUS DOPPLER ULTRASOUND  TECHNIQUE: Gray-scale sonography with graded compression, as well as color Doppler and duplex ultrasound were performed to evaluate the upper extremity deep venous system from the level of the subclavian vein and including the jugular, axillary, basilic and upper cephalic vein. Spectral Doppler was utilized to evaluate flow at rest and with distal augmentation maneuvers.  COMPARISON:  None.  FINDINGS: Thrombus within deep veins:  None visualized.  Compressibility of deep veins:  Normal.  Duplex waveform respiratory phasicity:  Normal.  Duplex waveform response to augmentation:  Normal.  Venous reflux:  None visualized.  Other findings:  Mild subcutaneous edema is noted.  IMPRESSION: No evidence of deep venous thrombosis or superficial venous thrombosis. Exam  is somewhat limited due to overlying bandage within the upper arm.   Electronically Signed   By: Inez Catalina M.D.   On: 09/22/2013 11:36   Dg Abd Portable 1v  09/21/2013   CLINICAL DATA:  History of small bowel obstruction.  Followup study.  EXAM: PORTABLE ABDOMEN - 1 VIEW  COMPARISON:  CT of the abdomen and pelvis 09/17/2013.  FINDINGS: Tubing is seen projecting over the lower thorax, with the tip in the expected location of the distal esophagus just proximal to the gastroesophageal junction. Gas is noted throughout the colon, including some distal rectal gas. No pathologic distention of small bowel. No gross evidence of pneumoperitoneum on this single supine view of the abdomen. Numerous surgical clips and sutures are seen in the right side of the abdomen. Postoperative changes of ORIF in the right proximal femur are incompletely visualized.  IMPRESSION: 1. Nonobstructive bowel gas pattern. 2. No pneumoperitoneum. 3. Postoperative changes and support apparatus, as above. Please take note of the tubing that projects over the lower thorax which likely represents a nasogastric tube, tip of which is just proximal to the gastroesophageal junction.   Electronically Signed   By: Vinnie Langton M.D.   On: 09/21/2013 10:33    Anti-infectives: Anti-infectives   None      Assessment/Plan: Impression: Small bowel obstruction, resolved Plan: Would continue full liquid diet with supplementation as needed. We'll advance to soft diet as patient tolerates. No need for acute surgical intervention. We'll follow peripherally with you.  LOS:  5 days    [name] A 09/22/2013

## 2013-09-22 NOTE — Progress Notes (Signed)
Pt's PICC site was edematous, leaking and will not draw blood. Infusion stopped. Dr. [place] Pink notified and stated to notify IV team. Will pass message on to day shift to notify IV team. Will continue to monitor pt closely.

## 2013-09-22 NOTE — Progress Notes (Signed)
[facility] PROGRESS NOTE  [name] HCW:[id number] DOB: 02-Apr-1920 DOA: 09/17/2013 PCP: [name], MD  Summary: 80 year old woman with history of dementia, Parkinson's disease presented with history of recurrent nausea, vomiting, failure to thrive, poor oral intake with last bowel movement 12/29. Initial evaluation revealed small bowel obstruction. Patient admitted for the same, surgical consult. There has been no clinical change in her condition, plan repeat abdominal x-ray in the morning, management per surgery. Prognosis is guarded.  Assessment/Plan: 1. Small bowel obstruction: NG tube discontinued and patient on full liquids. She is moving bowels.  She was having some nausea today and questionable vomiting.  Will continue to monitor for another 24 hours. 2. Dehydration: Secondary to high NG tube output. Continue IV fluids for now 3. History of Parkinson's disease, advanced dementia with chronic behavioral disturbances. Appears to be at baseline. Resume Celexa, Cogentin when improved. 4. History of coronary artery disease 5. Murmur--echo cardiogram shows some aortic valve sclerosis and hypertrophic/hypertensive cardiomyopathy. 6. HTN.  Resume home dose of betablockers 7. Tachycardia.  Likely reflex due to not receiving beta blockers.  Will resume home dose metoprolol 8. Hypernatremia.  Improved with IV fluids 9. Left arm swelling.  No DVT on imaging.  Likely related to infiltrated IV.   Code Status: DNR DVT prophylaxis: SCD Family Communication: discussed with family at the bedside Disposition Plan: possible discharge home with services in the next 24 hours  Kathie Dike, MD  [facility]  Pager [phone] If 7PM-7AM, please contact night-coverage at [email or web address], password [facility] 09/22/2013, 7:24 PM  LOS: 5 days   Consultants:  General surgery  Procedures:    Antibiotics:    HPI/Subjective: Continues to move bowels.  Has some nausea today.  Family  reports that she was "spitting up" today. Patient says she does not feel well.  Objective: Filed Vitals:   09/21/13 2040 09/22/13 0109 09/22/13 0653 09/22/13 1720  BP: 132/68 112/74 136/88 180/79  Pulse: 81 90 81 118  Temp: 98.4 F (36.9 C)  98.8 F (37.1 C) 97.8 F (36.6 C)  TempSrc: Oral  Oral Oral  Resp: 20  19   Height:      Weight:      SpO2: 96%  98%    No intake or output data in the 24 hours ending 09/22/13 1924   Filed Weights   09/17/13 2317  Weight: 55.1 kg (121 lb 7.6 oz)    Exam:   Afebrile, vital signs stable. No hypoxia.  General: appears calm, awake, non-toxic but appears chronically ill and weak  CV: RRR 2/6 systolic murmur, no m/r/g  Resp: CTA bilaterally no w/r/r  Abd softer, ntnd, bs+  Skin no rash or induration  Psych: answering questions  Neurologic: cannot assess  Scheduled Meds: . antiseptic oral rinse  15 mL Mouth Rinse q12n4p  . bisacodyl  10 mg Rectal q morning - 10a  . chlorhexidine  15 mL Mouth Rinse BID  . metoprolol tartrate  25 mg Oral BID  . pantoprazole (PROTONIX) IV  40 mg Intravenous Q24H  . rivastigmine  9.5 mg Transdermal Daily  . sodium chloride  10-40 mL Intracatheter Q12H   Continuous Infusions: . dextrose 1,000 mL (09/22/13 0043)    Principal Problem:   SBO (small bowel obstruction) Active Problems:   Dementia   Parkinson disease   Lesion of liver   Time spent 25 minutes

## 2013-09-22 NOTE — Care Management Note (Addendum)
    Page 1 of 2   09/26/2013     3:02:02 PM   CARE MANAGEMENT NOTE 09/26/2013  Patient:  [name], [name]   Account Number:  [id number]  Date Initiated:  09/22/2013  Documentation initiated by:  Theophilus Kinds  Subjective/Objective Assessment:   Pt admitted from home with SBO. Pt lives with her daughter and will return home at discharge. Pt is totally dependent for ADL's. Pt has hospital bed, w/c, and walker for home use.     Action/Plan:   Pts daughter would like [place] RN and aide and chose [facility] for [facility]. Romualdo Bolk of [facility] is aware and will collect the pts information from the chart. [place] services to start within 48 hours of d/c.   Anticipated DC Date:  09/23/2013   Anticipated DC Plan:  [place]  CM consult      [facility] Choice  HOME HEALTH   Choice offered to / List presented to:  C-4 Adult Children        HH arranged  HH-1 RN  [place] PT      [place].   Status of service:  Completed, signed off Medicare Important Message given?   (If response is "NO", the following Medicare IM given date fields will be blank) Date Medicare IM given:   Date Additional Medicare IM given:    Discharge Disposition:  [place]  Per UR Regulation:    If discussed at Long Length of Stay Meetings, dates discussed:   09/23/2013  09/25/2013    Comments:  09/26/13 [place], RN BSN CM Pt to be discharged home today with [facility] HH. Romualdo Bolk of [facility] is aware and will collect the pts information from the chart. [place] services to start within 48 hours. Pts daughter and pts nurse aware of discharge arrangements.  09/24/13 [place], RN BSN CM Pt discharged home today with [facility] RN and Pt, aide. Romualdo Bolk of [facility] is aware and will collect the pts information from the chart. [place] services to start within 48 hours of discharge. No DME needs noted. Pts daugher and pts nurse aware of discharge  arrangements.  09/22/13 [place], RN BSN CM

## 2013-09-23 ENCOUNTER — Inpatient Hospital Stay (HOSPITAL_COMMUNITY): Payer: PRIVATE HEALTH INSURANCE

## 2013-09-23 DIAGNOSIS — A0472 Enterocolitis due to Clostridium difficile, not specified as recurrent: Secondary | ICD-10-CM | POA: Diagnosis present

## 2013-09-23 DIAGNOSIS — R5383 Other fatigue: Secondary | ICD-10-CM

## 2013-09-23 DIAGNOSIS — R5381 Other malaise: Secondary | ICD-10-CM

## 2013-09-23 LAB — CBC
HCT: 37 % (ref 36.0–46.0)
Hemoglobin: 11.9 g/dL — ABNORMAL LOW (ref 12.0–15.0)
MCH: 22.8 pg — ABNORMAL LOW (ref 26.0–34.0)
MCHC: 32.2 g/dL (ref 30.0–36.0)
MCV: 70.7 fL — ABNORMAL LOW (ref 78.0–100.0)
Platelets: 327 10*3/uL (ref 150–400)
RBC: 5.23 MIL/uL — ABNORMAL HIGH (ref 3.87–5.11)
RDW: 17.4 % — ABNORMAL HIGH (ref 11.5–15.5)
WBC: 14.5 10*3/uL — ABNORMAL HIGH (ref 4.0–10.5)

## 2013-09-23 LAB — BASIC METABOLIC PANEL
BUN: 13 mg/dL (ref 6–23)
CO2: 20 mEq/L (ref 19–32)
Calcium: 8.1 mg/dL — ABNORMAL LOW (ref 8.4–10.5)
Chloride: 107 mEq/L (ref 96–112)
Creatinine, Ser: 0.72 mg/dL (ref 0.50–1.10)
GFR calc Af Amer: 84 mL/min — ABNORMAL LOW (ref >90)
GFR calc non Af Amer: 73 mL/min — ABNORMAL LOW (ref >90)
Glucose, Bld: 103 mg/dL — ABNORMAL HIGH (ref 70–99)
Potassium: 3.7 mEq/L (ref 3.7–5.3)
Sodium: 139 mEq/L (ref 137–147)

## 2013-09-23 LAB — CLOSTRIDIUM DIFFICILE BY PCR: Toxigenic C. Difficile by PCR: POSITIVE — AB

## 2013-09-23 MED ORDER — VANCOMYCIN 50 MG/ML ORAL SOLUTION
125.0000 mg | Freq: Four times a day (QID) | ORAL | Status: DC
  Administered 2013-09-23 – 2013-09-26 (×12): 125 mg via ORAL
  Filled 2013-09-23 (×24): qty 2.5

## 2013-09-23 MED ORDER — LEVOFLOXACIN 750 MG PO TABS
750.0000 mg | ORAL_TABLET | Freq: Every day | ORAL | Status: DC
  Administered 2013-09-23: 750 mg via ORAL
  Filled 2013-09-23: qty 1

## 2013-09-23 MED ORDER — VANCOMYCIN 50 MG/ML ORAL SOLUTION
ORAL | Status: AC
  Filled 2013-09-23: qty 15

## 2013-09-23 NOTE — Progress Notes (Signed)
  Subjective: Resting comfortably. No abdominal pain.  Objective: Vital signs in last 24 hours: Temp:  [97.6 F (36.4 C)-97.9 F (36.6 C)] 97.6 F (36.4 C) (01/06 0452) Pulse Rate:  [89-118] 89 (01/06 0452) Resp:  [20] 20 (01/06 0452) BP: (123-180)/(74-83) 128/74 mmHg (01/06 0452) SpO2:  [95 %-96 %] 96 % (01/06 0452) Last BM Date: 09/22/13  Intake/Output from previous day:   Intake/Output this shift:    GI: soft, non-tender; bowel sounds normal; no masses,  no organomegaly  Lab Results:   Recent Labs  09/22/13 0455 09/23/13 0506  WBC 11.5* 14.5*  HGB 12.1 11.9*  HCT 37.2 37.0  PLT 332 327   BMET  Recent Labs  09/22/13 0455 09/23/13 0506  NA 141 139  K 3.5* 3.7  CL 110 107  CO2 21 20  GLUCOSE 124* 103*  BUN 15 13  CREATININE 0.72 0.72  CALCIUM 8.3* 8.1*   PT/INR No results found for this basename: LABPROT, INR,  in the last 72 hours  Studies/Results: US Venous Img Upper Uni Right  09/22/2013   CLINICAL DATA:  Right arm swelling  EXAM: Right UPPER EXTREMITY VENOUS DOPPLER ULTRASOUND  TECHNIQUE: Gray-scale sonography with graded compression, as well as color Doppler and duplex ultrasound were performed to evaluate the upper extremity deep venous system from the level of the subclavian vein and including the jugular, axillary, basilic and upper cephalic vein. Spectral Doppler was utilized to evaluate flow at rest and with distal augmentation maneuvers.  COMPARISON:  None.  FINDINGS: Thrombus within deep veins:  None visualized.  Compressibility of deep veins:  Normal.  Duplex waveform respiratory phasicity:  Normal.  Duplex waveform response to augmentation:  Normal.  Venous reflux:  None visualized.  Other findings:  Mild subcutaneous edema is noted.  IMPRESSION: No evidence of deep venous thrombosis or superficial venous thrombosis. Exam is somewhat limited due to overlying bandage within the upper arm.   Electronically Signed   By: Inez Catalina M.D.   On:  09/22/2013 11:36    Anti-infectives: Anti-infectives   None      Assessment/Plan: Impression: Small bowel obstruction, resolved. I do not know why patient has leukocytosis. No apparent intra-abdominal source. Will follow peripherally with you.  LOS: 6 days    Lomax Poehler A 09/23/2013

## 2013-09-23 NOTE — Progress Notes (Signed)
Pt had one episode of emesis. MD is aware. Antiemetic will be given. Will continue to monitor.

## 2013-09-23 NOTE — Progress Notes (Signed)
[facility] PROGRESS NOTE  [name] SJG:[id number] DOB: February 21, 1920 DOA: 09/17/2013 PCP: [name], MD  Summary: 80 year old woman with history of dementia, Parkinson's disease presented with history of recurrent nausea, vomiting, failure to thrive, poor oral intake with last bowel movement 12/29. Initial evaluation revealed small bowel obstruction. Patient admitted for the same, surgical consult. There has been no clinical change in her condition, plan repeat abdominal x-ray in the morning, management per surgery. Prognosis is guarded.  Assessment/Plan: 1. Small bowel obstruction: NG tube discontinued and patient on full liquids. She is moving bowels.  Tolerating full liquids.  Advance diet as tolerated. 2. Dehydration: improved 3. History of Parkinson's disease, advanced dementia with chronic behavioral disturbances. Appears to be at baseline. Resume Celexa, Cogentin when improved. 4. History of coronary artery disease 5. Murmur--echo cardiogram shows some aortic valve sclerosis and hypertrophic/hypertensive cardiomyopathy. 6. HTN.  Resume home dose of betablockers 7. Tachycardia.  Likely reflex due to not receiving beta blockers.  Will resume home dose metoprolol 8. Hypernatremia.  Improved with IV fluids 9. Left arm swelling.  No DVT on imaging.  Likely related to infiltrated IV. 10. C diff diarrhea.  Stool c diff pcr tested positive.  Will start the patient on oral vancomycin.   Code Status: DNR DVT prophylaxis: SCD Family Communication: discussed with family at the bedside Disposition Plan: possible discharge home with services in the next 24 hours  Kathie Dike, MD  [facility]  Pager [phone] If 7PM-7AM, please contact night-coverage at [email or web address], password [email or web address] 09/23/2013, 8:11 PM  LOS: 6 days   Consultants:  General surgery  Procedures:    Antibiotics:    HPI/Subjective: Daughter reports that the patient is doing better today.  Tolerated  breakfast.  Still having frequent stools.   Objective: Filed Vitals:   09/22/13 2055 09/23/13 0452 09/23/13 1216 09/23/13 1415  BP: 123/76 128/74 135/91 139/80  Pulse: 109 89 90 66  Temp: 97.9 F (36.6 C) 97.6 F (36.4 C)  98.3 F (36.8 C)  TempSrc: Oral Oral  Oral  Resp: 20 20  20   Height:      Weight:      SpO2: 95% 96%  100%    Intake/Output Summary (Last 24 hours) at 09/23/13 2011 Last data filed at 09/23/13 1300  Gross per 24 hour  Intake    370 ml  Output      0 ml  Net    370 ml     Filed Weights   09/17/13 2317  Weight: 55.1 kg (121 lb 7.6 oz)    Exam:   Afebrile, vital signs stable. No hypoxia.  General: appears calm, awake, non-toxic but appears chronically ill and weak  CV: RRR 2/6 systolic murmur, no m/r/g  Resp: CTA bilaterally no w/r/r  Abd softer, ntnd, bs+  Skin no rash or induration  Psych: answering questions  Neurologic: cannot assess  Scheduled Meds: . ALPRAZolam  0.5 mg Oral Daily  . antiseptic oral rinse  15 mL Mouth Rinse q12n4p  . bisacodyl  10 mg Rectal q morning - 10a  . chlorhexidine  15 mL Mouth Rinse BID  . metoprolol tartrate  25 mg Oral BID  . pantoprazole (PROTONIX) IV  40 mg Intravenous Q24H  . rivastigmine  9.5 mg Transdermal Daily  . sodium chloride  10-40 mL Intracatheter Q12H  . vancomycin  125 mg Oral Q6H   Continuous Infusions: . dextrose 1,000 mL (09/22/13 0043)    Principal Problem:   SBO (small bowel obstruction)  Active Problems:   Dementia   Parkinson disease   Lesion of liver   Time spent 25 minutes

## 2013-09-24 ENCOUNTER — Inpatient Hospital Stay (HOSPITAL_COMMUNITY): Payer: PRIVATE HEALTH INSURANCE

## 2013-09-24 DIAGNOSIS — J189 Pneumonia, unspecified organism: Secondary | ICD-10-CM | POA: Diagnosis present

## 2013-09-24 LAB — CBC
HCT: 36.1 % (ref 36.0–46.0)
Hemoglobin: 12 g/dL (ref 12.0–15.0)
MCH: 23.6 pg — ABNORMAL LOW (ref 26.0–34.0)
MCHC: 33.2 g/dL (ref 30.0–36.0)
MCV: 71.1 fL — ABNORMAL LOW (ref 78.0–100.0)
Platelets: 266 10*3/uL (ref 150–400)
RBC: 5.08 MIL/uL (ref 3.87–5.11)
RDW: 17.4 % — ABNORMAL HIGH (ref 11.5–15.5)
WBC: 11.7 10*3/uL — ABNORMAL HIGH (ref 4.0–10.5)

## 2013-09-24 LAB — BASIC METABOLIC PANEL
BUN: 10 mg/dL (ref 6–23)
CO2: 21 mEq/L (ref 19–32)
Calcium: 8.4 mg/dL (ref 8.4–10.5)
Chloride: 108 mEq/L (ref 96–112)
Creatinine, Ser: 0.66 mg/dL (ref 0.50–1.10)
GFR calc Af Amer: 87 mL/min — ABNORMAL LOW (ref >90)
GFR calc non Af Amer: 75 mL/min — ABNORMAL LOW (ref >90)
Glucose, Bld: 103 mg/dL — ABNORMAL HIGH (ref 70–99)
Potassium: 4.5 mEq/L (ref 3.7–5.3)
Sodium: 140 mEq/L (ref 137–147)

## 2013-09-24 MED ORDER — POLYETHYLENE GLYCOL 3350 17 G PO PACK
17.0000 g | PACK | Freq: Every day | ORAL | Status: DC
  Administered 2013-09-24: 17 g via ORAL
  Filled 2013-09-24: qty 1

## 2013-09-24 MED ORDER — VANCOMYCIN 50 MG/ML ORAL SOLUTION: 125.0000 mg | Freq: Four times a day (QID) | ORAL | Status: DC

## 2013-09-24 MED ORDER — LEVOFLOXACIN 750 MG PO TABS: 750.0000 mg | ORAL_TABLET | Freq: Every day | ORAL | Status: DC

## 2013-09-24 MED ORDER — ONDANSETRON HCL 4 MG/2ML IJ SOLN
4.0000 mg | Freq: Four times a day (QID) | INTRAMUSCULAR | Status: DC
  Administered 2013-09-24 – 2013-09-26 (×9): 4 mg via INTRAVENOUS
  Filled 2013-09-24 (×9): qty 2

## 2013-09-24 MED ORDER — METOCLOPRAMIDE HCL 5 MG/ML IJ SOLN
5.0000 mg | Freq: Four times a day (QID) | INTRAMUSCULAR | Status: DC
  Administered 2013-09-24 – 2013-09-26 (×9): 5 mg via INTRAVENOUS
  Filled 2013-09-24 (×9): qty 2

## 2013-09-24 MED ORDER — METOPROLOL TARTRATE 50 MG PO TABS: 25.0000 mg | ORAL_TABLET | Freq: Two times a day (BID) | ORAL | Status: DC

## 2013-09-24 MED ORDER — LEVOFLOXACIN IN D5W 750 MG/150ML IV SOLN
750.0000 mg | INTRAVENOUS | Status: DC
Start: 2013-09-24 — End: 2013-09-26
  Administered 2013-09-24 – 2013-09-25 (×2): 750 mg via INTRAVENOUS
  Filled 2013-09-24 (×5): qty 150

## 2013-09-24 MED ORDER — SACCHAROMYCES BOULARDII 250 MG PO CAPS
250.0000 mg | ORAL_CAPSULE | Freq: Two times a day (BID) | ORAL | Status: DC
  Administered 2013-09-24 – 2013-09-26 (×4): 250 mg via ORAL
  Filled 2013-09-24 (×4): qty 1

## 2013-09-24 NOTE — Progress Notes (Addendum)
Patient had order to be discharged. Patient had multiply emesis MD was notified and patient will not be discharged today. Also notified MD about patients Midline IV not having any blood return. MD stated it would be fine to use because there is no clot present and it is in the correct placement. Midline site flushed with no problem. Will continue to monitor patient.

## 2013-09-24 NOTE — Progress Notes (Signed)
[facility] PROGRESS NOTE  SIMMONE CAPE GGY:[id number] DOB: [birth year] DOA: 09/17/2013 PCP: [name], MD  Summary: 80 year old woman with history of dementia, Parkinson's disease presented with history of recurrent nausea, vomiting, failure to thrive, poor oral intake with last bowel movement 12/29. Initial evaluation revealed small bowel obstruction. Patient admitted for the same, surgical consult. There has been no clinical change in her condition, plan repeat abdominal x-ray in the morning, management per surgery. Prognosis is guarded.  Assessment/Plan: 1. Small bowel obstruction: NG tube had been discontinued and she is currently on full liquids.  She continues to have nausea and vomiting.  Abdominal xray was repeated and shows nonspeicific bowel gas pattern, possibly mild ileus. Will start the patient on reglan as well as miralax 2. Possible pneumonia.  Chest xray indicates pneumonia vs. Atelectasis.  Will start the patient on a course of levofloxacin. 3. Dehydration: improved 4. History of Parkinson's disease, advanced dementia with chronic behavioral disturbances. Appears to be at baseline. Resume Celexa, Cogentin when improved. 5. History of coronary artery disease 6. Murmur--echo cardiogram shows some aortic valve sclerosis and hypertrophic/hypertensive cardiomyopathy. 7. HTN.  Resume home dose of betablockers 8. Tachycardia.  Resolved with resumption of Beta blocker 9. Hypernatremia.  Improved with IV fluids 10. Left arm swelling.  No DVT on imaging.  Likely related to infiltrated IV. 11. C diff diarrhea.  Stool c diff pcr tested positive.  Started on vancomycin.  Will need to complete a total of 7 days.   Code Status: DNR DVT prophylaxis: SCD Family Communication: discussed with family at the bedside Disposition Plan: possible discharge home with services in the next 24 hours  Kathie Dike, MD  [facility]  Pager [phone] If 7PM-7AM, please contact  night-coverage at [email or web address], password [facility] 09/24/2013, 8:26 PM  LOS: 7 days   Consultants:  General surgery  Procedures:    Antibiotics:  Vancomycin 1/6  levaquin 1/7  HPI/Subjective: Patient's loose stools have been improving. Her daughter feels that she is coughing more and is increasingly short of breath.   Objective: Filed Vitals:   09/23/13 1415 09/23/13 2202 09/24/13 0531 09/24/13 1414  BP: 139/80 118/77 137/85 151/69  Pulse: 66 91 84 85  Temp: 98.3 F (36.8 C) 97.7 F (36.5 C) 98 F (36.7 C) 97.6 F (36.4 C)  TempSrc: Oral   Oral  Resp: 20 18 16 18   Height:      Weight:      SpO2: 100% 98% 98% 100%    Intake/Output Summary (Last 24 hours) at 09/24/13 2026 Last data filed at 09/24/13 1727  Gross per 24 hour  Intake    390 ml  Output      0 ml  Net    390 ml     Filed Weights   09/17/13 2317  Weight: 55.1 kg (121 lb 7.6 oz)    Exam:   Afebrile, vital signs stable. No hypoxia.  General: appears calm, awake, non-toxic but appears chronically ill and weak  CV: RRR 2/6 systolic murmur, no m/r/g  Resp: coarse crackles at bases  Abd soft, ntnd, bs+  Skin no rash or induration  Psych: answering questions  Neurologic: cannot assess  Scheduled Meds: . ALPRAZolam  0.5 mg Oral Daily  . antiseptic oral rinse  15 mL Mouth Rinse q12n4p  . bisacodyl  10 mg Rectal q morning - 10a  . chlorhexidine  15 mL Mouth Rinse BID  . levofloxacin (LEVAQUIN) IV  750 mg Intravenous Q24H  . metoCLOPramide (REGLAN)  injection  5 mg Intravenous Q6H  . metoprolol tartrate  25 mg Oral BID  . ondansetron (ZOFRAN) IV  4 mg Intravenous Q6H  . pantoprazole (PROTONIX) IV  40 mg Intravenous Q24H  . rivastigmine  9.5 mg Transdermal Daily  . sodium chloride  10-40 mL Intracatheter Q12H  . vancomycin  125 mg Oral Q6H   Continuous Infusions:    Principal Problem:   SBO (small bowel obstruction) Active Problems:   Dementia   Parkinson disease   Lesion of liver    Enteritis due to Clostridium difficile   Time spent 25 minutes

## 2013-09-25 DIAGNOSIS — J189 Pneumonia, unspecified organism: Secondary | ICD-10-CM

## 2013-09-25 LAB — BASIC METABOLIC PANEL
BUN: 5 mg/dL — ABNORMAL LOW (ref 6–23)
CO2: 20 mEq/L (ref 19–32)
Calcium: 8.2 mg/dL — ABNORMAL LOW (ref 8.4–10.5)
Chloride: 107 mEq/L (ref 96–112)
Creatinine, Ser: 0.62 mg/dL (ref 0.50–1.10)
GFR calc Af Amer: 89 mL/min — ABNORMAL LOW (ref >90)
GFR calc non Af Amer: 76 mL/min — ABNORMAL LOW (ref >90)
Glucose, Bld: 102 mg/dL — ABNORMAL HIGH (ref 70–99)
Potassium: 3.8 mEq/L (ref 3.7–5.3)
Sodium: 139 mEq/L (ref 137–147)

## 2013-09-25 LAB — CBC
HCT: 34.2 % — ABNORMAL LOW (ref 36.0–46.0)
Hemoglobin: 11.2 g/dL — ABNORMAL LOW (ref 12.0–15.0)
MCH: 23 pg — ABNORMAL LOW (ref 26.0–34.0)
MCHC: 32.7 g/dL (ref 30.0–36.0)
MCV: 70.4 fL — ABNORMAL LOW (ref 78.0–100.0)
Platelets: 330 10*3/uL (ref 150–400)
RBC: 4.86 MIL/uL (ref 3.87–5.11)
RDW: 17.1 % — ABNORMAL HIGH (ref 11.5–15.5)
WBC: 12.3 10*3/uL — ABNORMAL HIGH (ref 4.0–10.5)

## 2013-09-25 MED ORDER — MILK AND MOLASSES ENEMA
1.0000 | Freq: Once | RECTAL | Status: AC
  Administered 2013-09-25: 250 mL via RECTAL

## 2013-09-25 MED ORDER — BUMETANIDE 1 MG PO TABS
0.5000 mg | ORAL_TABLET | Freq: Every day | ORAL | Status: DC
  Administered 2013-09-25 – 2013-09-26 (×2): 0.5 mg via ORAL
  Filled 2013-09-25 (×2): qty 1

## 2013-09-25 NOTE — Progress Notes (Signed)
[facility] PROGRESS NOTE  [name] IRJ:[id number] DOB: 03/15/1920 DOA: 09/17/2013 PCP: [name], MD  Summary: 80 year old woman with history of dementia, Parkinson's disease presented with history of recurrent nausea, vomiting, failure to thrive, poor oral intake with last bowel movement 12/29. Initial evaluation revealed small bowel obstruction. Patient admitted for the same, surgical consult. There has been no clinical change in her condition, plan repeat abdominal x-ray in the morning, management per surgery. Prognosis is guarded.  Assessment/Plan: 1. Small bowel obstruction: NG tube had been discontinued and she is currently on full liquids.  She continues to have nausea and vomiting.  Abdominal xray was repeated and shows nonspeicific bowel gas pattern, possibly mild ileus. She is now on reglan and miralax.  Will give one enema to promote bowel function 2. Possible aspiration pneumonia.  Chest xray indicates pneumonia vs. Atelectasis.  Patient on a course of levofloxacin. She is being seen by speech therapy and started on a modified diet. 3. Dehydration: improved 4. History of Parkinson's disease, advanced dementia with chronic behavioral disturbances. Appears to be at baseline. Resume Celexa, Cogentin when improved. 5. History of coronary artery disease 6. Murmur--echo cardiogram shows some aortic valve sclerosis and hypertrophic/hypertensive cardiomyopathy. 7. HTN.  Resume home dose of betablockers 8. Tachycardia.  Resolved with resumption of Beta blocker 9. Hypernatremia.  Improved with IV fluids 10. Left arm swelling.  No DVT on imaging.  Likely related to infiltrated peripheral IV which has since been removed. 11. C diff diarrhea.  Stool c diff pcr tested positive.  Started on vancomycin.  Will need to complete a total of 14 days.   Code Status: DNR DVT prophylaxis: SCD Family Communication: discussed with family at the bedside Disposition Plan: possible discharge  home with services in the next 24 hours  Kathie Dike, MD  [facility]  Pager [phone] If 7PM-7AM, please contact night-coverage at [email or web address], password [facility] 09/25/2013, 3:37 PM  LOS: 8 days   Consultants:  General surgery  Procedures:    Antibiotics:  Vancomycin 1/6  levaquin 1/7  HPI/Subjective: Only had 2 stools yesterday.  Still has "spitting up" around meals, does not appear as short of breath today  Objective: Filed Vitals:   09/24/13 1414 09/24/13 2142 09/25/13 1051 09/25/13 1500  BP: 151/69 160/72 131/77 124/73  Pulse: 85 82 93 89  Temp: 97.6 F (36.4 C) 98 F (36.7 C)  98.8 F (37.1 C)  TempSrc: Oral Oral  Oral  Resp: 18 18  18   Height:      Weight:      SpO2: 100% 98%  96%    Intake/Output Summary (Last 24 hours) at 09/25/13 1537 Last data filed at 09/25/13 1302  Gross per 24 hour  Intake    390 ml  Output      0 ml  Net    390 ml     Filed Weights   09/17/13 2317  Weight: 55.1 kg (121 lb 7.6 oz)    Exam:   Afebrile, vital signs stable. No hypoxia.  General: appears calm, awake, non-toxic but appears chronically ill and weak  CV: RRR 2/6 systolic murmur, no m/r/g  Resp: poor inspiratory effort, diminished breath sounds b/l  Abd soft, ntnd, bs+  Skin no rash or induration  Psych: answering questions  Neurologic: cannot assess  Scheduled Meds: . ALPRAZolam  0.5 mg Oral Daily  . antiseptic oral rinse  15 mL Mouth Rinse q12n4p  . bisacodyl  10 mg Rectal q morning - 10a  .  chlorhexidine  15 mL Mouth Rinse BID  . levofloxacin (LEVAQUIN) IV  750 mg Intravenous Q24H  . metoCLOPramide (REGLAN) injection  5 mg Intravenous Q6H  . metoprolol tartrate  25 mg Oral BID  . ondansetron (ZOFRAN) IV  4 mg Intravenous Q6H  . pantoprazole (PROTONIX) IV  40 mg Intravenous Q24H  . polyethylene glycol  17 g Oral Daily  . rivastigmine  9.5 mg Transdermal Daily  . saccharomyces boulardii  250 mg Oral BID  . sodium chloride  10-40 mL  Intracatheter Q12H  . vancomycin  125 mg Oral Q6H   Continuous Infusions:    Principal Problem:   SBO (small bowel obstruction) Active Problems:   Dementia   Parkinson disease   Lesion of liver   Enteritis due to Clostridium difficile   Pneumonia   Time spent 25 minutes

## 2013-09-25 NOTE — Evaluation (Signed)
Clinical/Bedside Swallow Evaluation  Patient Details  Name: [name] MRN: [id number] Date of Birth: 1919/11/10  Today's Date: 09/25/2013 Time: 1100-1140 SLP Time Calculation (min): 40 min  Past Medical History:  Past Medical History  Diagnosis Date  . Hypertension   . Heart attack   . Parkinson disease   . Cancer     breast  . Alzheimer's dementia    Past Surgical History:  Past Surgical History  Procedure Laterality Date  . Breast surgery    . Abdominal hysterectomy    . Mastectomy    . Small intestine removed    . Tonsillectomy    . Cholecystectomy    . Colon surgery    . Pelvic fracture surgery    . Leg surgery    . Arm surgery    . Hip pinning,cannulated Right 02/01/2013    Procedure: CANNULATED HIP PINNING;  Surgeon: Sanjuana Kava, MD;  Location: [facility];  Service: Orthopedics;  Laterality: Right;   HPI:  80 year old woman with history of dementia, Parkinson's disease presented with history of recurrent nausea, vomiting, failure to thrive, poor oral intake with last bowel movement 12/29. Initial evaluation revealed small bowel obstruction.   Assessment / Plan / Recommendation Clinical Impression  [name] was evaluated with daughter at bedside who reports concerns that her mother has been "spitting up"/"throwing up". This has not happened yet today and she reportedly consumed A.M. meal (thin liquids, oatmeal) without incident. She denies dysphagia at home and reports that typically her mother is quite active, but needs feeding assist due to tremor (Parkinson's) and chopped food (poor dentition); has good appetite. Pt with intermittent coughing after thin liquids and sherbet today and seemed related to cognition and likely premature spillage into pharynx. Pt clenches teeth at times while attempting to speak with pooled secretions in oral cavity. Pt assesed with nectar-thick liquids and she tolerated without incident. Frequent belching throughout the evaluation. Pt shows  signs of fatigue (was given Morphene) earlier with intermittent alertness. Recommend D1 (puree) and nectar-thick liquids until general alertness improves with SLP to follow for upgrades. Plan discussed with pt's daughter and she is supportive of recommendations. SLP will follow.    Aspiration Risk  Mild    Diet Recommendation Dysphagia 1 (Puree);Nectar-thick liquid   Liquid Administration via: Cup;Straw Medication Administration: Crushed with puree Supervision: Trained caregiver to feed patient;Full supervision/cueing for compensatory strategies Compensations: Slow rate;Small sips/bites;Multiple dry swallows after each bite/sip Postural Changes and/or Swallow Maneuvers: Seated upright 90 degrees;Upright 30-60 min after meal    Other  Recommendations Oral Care Recommendations: Oral care BID Other Recommendations: Order thickener from pharmacy;Clarify dietary restrictions   Follow Up Recommendations  Home health SLP    Frequency and Duration min 2x/week  1 week       SLP Swallow Goals  Pt will demonstrate safe and efficient consumption of D1/puree and nectar-thick liquids with mod cues.   Swallow Study Prior Functional Status   Pt lives at home with daughter and is reportedly mobile/active; dementia    General Date of Onset: 09/17/13 HPI: 80 year old woman with history of dementia, Parkinson's disease presented with history of recurrent nausea, vomiting, failure to thrive, poor oral intake with last bowel movement 12/29. Initial evaluation revealed small bowel obstruction. Type of Study: Bedside swallow evaluation Diet Prior to this Study: Other (Comment) (Full liquids) Temperature Spikes Noted: No Respiratory Status: Room air History of Recent Intubation: No Behavior/Cognition: Cooperative;Pleasant mood;Requires cueing Oral Cavity - Dentition: Poor condition Self-Feeding Abilities: Total assist  Patient Positioning: Upright in bed Baseline Vocal Quality: Clear Volitional  Cough: Cognitively unable to elicit Volitional Swallow: Able to elicit    Oral/Motor/Sensory Function Overall Oral Motor/Sensory Function: Impaired Labial ROM: Within Functional Limits Labial Symmetry: Within Functional Limits Labial Strength: Within Functional Limits Lingual ROM: Reduced right;Reduced left Lingual Symmetry: Within Functional Limits Lingual Strength: Reduced Facial ROM: Within Functional Limits Facial Symmetry: Within Functional Limits Facial Strength: Within Functional Limits Facial Sensation: Within Functional Limits Velum: Impaired right;Impaired left Mandible: Within Functional Limits   Ice Chips Ice chips: Within functional limits Presentation: Spoon   Thin Liquid Thin Liquid: Impaired Presentation: Cup;Straw;Spoon Oral Phase Impairments: Reduced labial seal Pharyngeal  Phase Impairments: Suspected delayed Swallow;Cough - Delayed;Cough - Immediate    Nectar Thick Nectar Thick Liquid: Within functional limits Presentation: Cup;Spoon;Straw   Honey Thick Honey Thick Liquid: Not tested   Puree Puree: Within functional limits Presentation: Spoon   Solid   GO    Solid: Impaired Presentation: Spoon Oral Phase Impairments: Reduced lingual movement/coordination Oral Phase Functional Implications: Other (comment) (mildly prolnged oral phase) Pharyngeal Phase Impairments: Cough - Delayed      Thank you,  Genene Churn, [place]  Kawanna Christley 09/25/2013,1:34 PM

## 2013-09-25 NOTE — Progress Notes (Signed)
Nutrition Brief Note  Patient identified on the Malnutrition Screening Tool (MST) Report, as well as LOS (day 8).  Wt Readings from Last 15 Encounters:  09/17/13 121 lb 7.6 oz (55.1 kg)  04/04/13 117 lb 8 oz (53.298 kg)  01/31/13 126 lb 8.7 oz (57.4 kg)  01/31/13 126 lb 8.7 oz (57.4 kg)  07/11/12 121 lb 7.6 oz (55.1 kg)  07/01/12 122 lb 4.8 oz (55.475 kg)  05/07/12 132 lb (59.875 kg)  08/14/11 123 lb (55.792 kg)  08/13/11 123 lb 9.6 oz (56.065 kg)   Pt admitted for SBO. Ng tube has been d/c. Pt was just upgraded to a Dysphagia 1 diet for lunch, previously on a full liquid diet. Tolerating well. Weight has been stable.   Body mass index is 20.21 kg/(m^2). Patient meets criteria for normal weight based on current BMI.   Current diet order is Dysphagia 1, patient is consuming approximately 50-100% of meals at this time. Labs and medications reviewed.   No nutrition interventions warranted at this time. If nutrition issues arise, please consult RD.   Latania Bascomb A. Jimmye Norman, RD, LDN Pager: [phone]

## 2013-09-26 LAB — CBC
HCT: 34.5 % — ABNORMAL LOW (ref 36.0–46.0)
Hemoglobin: 11.1 g/dL — ABNORMAL LOW (ref 12.0–15.0)
MCH: 22.5 pg — ABNORMAL LOW (ref 26.0–34.0)
MCHC: 32.2 g/dL (ref 30.0–36.0)
MCV: 69.8 fL — ABNORMAL LOW (ref 78.0–100.0)
Platelets: 396 10*3/uL (ref 150–400)
RBC: 4.94 MIL/uL (ref 3.87–5.11)
RDW: 17.2 % — ABNORMAL HIGH (ref 11.5–15.5)
WBC: 13.3 10*3/uL — ABNORMAL HIGH (ref 4.0–10.5)

## 2013-09-26 LAB — BASIC METABOLIC PANEL
BUN: 6 mg/dL (ref 6–23)
CO2: 22 mEq/L (ref 19–32)
Calcium: 8.4 mg/dL (ref 8.4–10.5)
Chloride: 107 mEq/L (ref 96–112)
Creatinine, Ser: 0.7 mg/dL (ref 0.50–1.10)
GFR calc Af Amer: 85 mL/min — ABNORMAL LOW (ref >90)
GFR calc non Af Amer: 73 mL/min — ABNORMAL LOW (ref >90)
Glucose, Bld: 109 mg/dL — ABNORMAL HIGH (ref 70–99)
Potassium: 3.5 mEq/L — ABNORMAL LOW (ref 3.7–5.3)
Sodium: 142 mEq/L (ref 137–147)

## 2013-09-26 LAB — PRO B NATRIURETIC PEPTIDE: Pro B Natriuretic peptide (BNP): 386.6 pg/mL (ref 0–450)

## 2013-09-26 MED ORDER — POLYETHYLENE GLYCOL 3350 17 G PO PACK: 17.0000 g | PACK | Freq: Every day | ORAL | Status: DC

## 2013-09-26 NOTE — Progress Notes (Signed)
UR chart review completed.  

## 2013-09-26 NOTE — Progress Notes (Signed)
Pt is alert with no complaints of any distress. Daughter at bedside. Discharge instructions and prescriptions given and discussed with pt daughter. The daughter verbalized understanding of instructions.

## 2013-09-26 NOTE — Discharge Summary (Signed)
Physician Discharge Summary  SIBYLLA SOLEM [id number] DOB: [birth year] DOA: 09/17/2013  PCP: [name], MD  Admit date: 09/17/2013 Discharge date: 09/26/2013  Time spent: 45 minutes  Recommendations for Outpatient Follow-up:  1. Patient has been set up with home health services 2. Follow up with primary care doctor in 1 week  Discharge Diagnoses:  Principal Problem:   SBO (small bowel obstruction) Active Problems:   Dementia   Parkinson disease   Lesion of liver   Enteritis due to Clostridium difficile   Pneumonia   Discharge Condition: improved  Diet recommendation: Dysphagia 1 with nectar thick liquid  Filed Weights   09/17/13 2317  Weight: 55.1 kg (121 lb 7.6 oz)    History of present illness:  [name] is a 80 y.o. female with a past with history of Parkinson's disease, and Alzheimer's dementia, and hypertension, who was in her usual state of health till Sunday, when she started having nausea. Patient has advanced dementia and was unable to provide any history. Most of the history was provided by her daughter. Apparently, the daughter took her home for holidays and she did well. The patient does live in her own home, but she has family support. On Sunday she started having some nausea, but was able to keep her meals down. She subsequently had episodes of diarrhea and vomiting on Monday. On Tuesday she had multiple episodes of vomiting without any diarrhea. She did not have any bleeding in her stool or in the emesis. On Wednesday her daughter felt that she was dehydrated. Patient was not taking anything by mouth. She had chills, but no fever. She was passing urine. Because patient was not improving she decided to bring her to the hospital. She hasn't had any bowel movements since Monday. History is limited due to the patient's dementia.   Hospital Course:  This is a 80 year old woman with history of dementia and Parkinson's disease who was admitted to the hospital with  small bowel obstruction. Patient was treated conservatively with NG tube and bowel rest. She was followed by general surgery. With conservative management her bowel obstruction since improved. She's been maintained on MiraLAX daily to promote regular bowel movements. Her hospital course was complicated by development of Clostridium difficile infection. She's been started on oral vancomycin with good effect and will complete 14 days. Her diarrhea appears to be improving. The patient was also noted to have significant dysphagia and was seen by speech therapy. Recommendations were for pureed Diet with nectar thick liquids. Unfortunately, it does appears that she started to develop an aspiration pneumonia based on development of cough and RLL infiltrate on chest xray. She's also been started on a course of levofloxacin. She is now afebrile, breathing comfortably, her cough has improved. Patient's daughter feels that the patient is approaching her functional baseline. She feels comfortable taking the patient home today. We'll have her followup with her primary care physician in one week.  Procedures:  none  Consultations:  General surgery  Discharge Exam: Filed Vitals:   09/26/13 0628  BP: 158/61  Pulse: 92  Temp: 98.6 F (37 C)  Resp: 18    General: NAD, converses Cardiovascular: S1, S2 RRR Respiratory: poor inspiratory effort, otherwise clear  Discharge Instructions  Discharge Orders   Future Orders Complete By Expires   Call MD for:  difficulty breathing, headache or visual disturbances  As directed    Call MD for:  persistant nausea and vomiting  As directed    Call MD  for:  temperature >100.4  As directed    Diet - low sodium heart healthy  As directed    Diet - low sodium heart healthy  As directed    Increase activity slowly  As directed    Increase activity slowly  As directed        Medication List         acetaminophen 500 MG tablet  Commonly known as:  TYLENOL  Take  500 mg by mouth at bedtime. For pain     ALPRAZolam 0.5 MG tablet  Commonly known as:  XANAX  Take 0.5 mg by mouth daily.     aspirin EC 81 MG tablet  Take 1 tablet (81 mg total) by mouth daily.     benztropine 1 MG tablet  Commonly known as:  COGENTIN  Take 1 tablet (1 mg total) by mouth daily.     bumetanide 0.5 MG tablet  Commonly known as:  BUMEX  Take 1 tablet (0.5 mg total) by mouth daily as needed (swelling).     citalopram 20 MG tablet  Commonly known as:  CELEXA  Take 1 tablet (20 mg total) by mouth every morning.     haloperidol 2 MG/ML solution  Commonly known as:  HALDOL  Take 1 mg by mouth 2 (two) times daily as needed for agitation.     levofloxacin 750 MG tablet  Commonly known as:  LEVAQUIN  Take 1 tablet (750 mg total) by mouth daily.     metoprolol 50 MG tablet  Commonly known as:  LOPRESSOR  Take 0.5 tablets (25 mg total) by mouth 2 (two) times daily.     polyethylene glycol packet  Commonly known as:  MIRALAX / GLYCOLAX  Take 17 g by mouth daily.     rivastigmine 9.5 mg/24hr  Commonly known as:  EXELON  Place 1 patch (9.5 mg total) onto the skin daily. *Remove and discard used patches*     vancomycin 50 mg/mL oral solution  Commonly known as:  VANCOCIN  Take 2.5 mLs (125 mg total) by mouth every 6 (six) hours. For 14 days     zolpidem 10 MG tablet  Commonly known as:  AMBIEN  Take 10 mg by mouth at bedtime as needed for sleep.       Allergies  Allergen Reactions  . Penicillins Swelling and Rash       Follow-up Information   Follow up with [facility].   Contact information:   [place] [id number] [phone]      Follow up with [facility] K, MD. Schedule an appointment as soon as possible for a visit in 1 week.   Specialty:  Family Medicine   Contact information:   [address] [place] [address] [phone]        The results of significant diagnostics from this hospitalization  (including imaging, microbiology, ancillary and laboratory) are listed below for reference.    Significant Diagnostic Studies: Ct Abdomen Pelvis Wo Contrast  09/17/2013   CLINICAL DATA:  Vomiting and abdominal pain. Diarrhea. History of breast cancer.  EXAM: CT ABDOMEN AND PELVIS WITHOUT CONTRAST  TECHNIQUE: Multidetector CT imaging of the abdomen and pelvis was performed following the standard protocol without intravenous contrast.  COMPARISON:  Multiple exams, including 09/17/2013  FINDINGS: Despite efforts by the technologist and patient, motion artifact is present on today's exam and could not be eliminated. This reduces exam sensitivity and specificity. Mild atelectasis suspected  in both lower lobes and posteriorly in the right middle lobe. Trace pericardial fluid anteriorly.  Note is again made of multiple hepatic cysts. The largest of these is primarily in the right hepatic lobe but measures 8.5 x 7.4 cm, significantly enlarged from that reported on the CT abdomen from 06/16/2002. However, the sites the scattered cysts, there also appear to be some dense liver lesions including a 4.8 x 4.6 cm lesion with target cord appearance and segment 2 and a complex 4.8 x 2.1 cm lesion in segment 3 of the liver. Numerous additional scattered hypodensities in the liver technically too small to characterize.  No splenomegaly.  The contour of the pancreas is unremarkable.  2.2 x 1.2 cm mass of the medial limb left adrenal gland measures 8 Hounsfield units in density, compatible with adenoma.  Hypodense left kidney lower pole lesion, 1.9 cm in diameter, fluid density.  Provided history of cholecystectomy, but a structure very likely to represent the gallbladder is shown along the gallbladder fossa and accordingly I doubt that the patient has actually had cholecystectomy. There are some clips adjacent to The suspected gallbladder.  The colon is nondilated than the patient seems to have a partial right hemicolectomy.  There multiple loops of dilated small bowel and multiple loops of nondilated small bowel. The transition point seems to be in the left pelvis where there is frothy material in the small bowel and a sudden transition in bowel caliber. There is a small amount of free pelvic fluid in this vicinity. There is adjacent sigmoid diverticulosis but no compelling findings of active diverticulitis.  Superior endplate compression at T12 noted with anterior wedge compression at L1. The L1 compression fracture has mild posterior bony retropulsion and there is mild grade 1 posterior subluxation at L1-2.  Screws are noted in the right hip. There is deformity in the left pelvis from remote pelvic fractures.  IMPRESSION: 1. Small bowel obstruction, with transition point in the left lower pelvis. Cause for the obstruction is uncertain but the may be adhesion. 2. Complex lesions in the left hepatic lobe could represent metastatic disease or complex cysts. The patient does have a history of polycystic liver disease, with enlargement of some of the cysts, but not of the cysts were reported as complex on the prior CT scan from 2003. 3. Provided history of cholecystectomy, but a candidate structure for gallbladder suggests that this history is wrong. 4. Trace pericardial effusion. 5. Mild bibasilar subsegmental atelectasis. 6. Superior endplate compression fracture T12 with anterior wedge compression fracture of L1, age indeterminate. 7. Left adrenal adenoma.   Electronically Signed   By: Sherryl Barters M.D.   On: 09/17/2013 18:26   US Venous Img Upper Uni Right  09/22/2013   CLINICAL DATA:  Right arm swelling  EXAM: Right UPPER EXTREMITY VENOUS DOPPLER ULTRASOUND  TECHNIQUE: Gray-scale sonography with graded compression, as well as color Doppler and duplex ultrasound were performed to evaluate the upper extremity deep venous system from the level of the subclavian vein and including the jugular, axillary, basilic and upper cephalic  vein. Spectral Doppler was utilized to evaluate flow at rest and with distal augmentation maneuvers.  COMPARISON:  None.  FINDINGS: Thrombus within deep veins:  None visualized.  Compressibility of deep veins:  Normal.  Duplex waveform respiratory phasicity:  Normal.  Duplex waveform response to augmentation:  Normal.  Venous reflux:  None visualized.  Other findings:  Mild subcutaneous edema is noted.  IMPRESSION: No evidence of deep venous thrombosis or  superficial venous thrombosis. Exam is somewhat limited due to overlying bandage within the upper arm.   Electronically Signed   By: Inez Catalina M.D.   On: 09/22/2013 11:36   Dg Chest Port 1 View  09/24/2013   CLINICAL DATA:  Cough and dyspnea  EXAM: PORTABLE CHEST - 1 VIEW  COMPARISON:  Portable chest x-ray of September 23, 2013.  FINDINGS: The lungs are less well inflated today. The pulmonary interstitial markings remain increased and there have developed confluent lung markings above the right hemidiaphragm. The cardiopericardial silhouette is mildly enlarged. The pulmonary vascularity demonstrate some crowding and mild engorgement. There is no significant pleural effusion. The bony structures appear osteopenic diffusely.  IMPRESSION: Since the earlier study the appearance of the chest as deteriorated somewhat. The findings are consistent with bibasilar atelectasis and possible early pneumonia on the right. There is an element of low-grade interstitial edema likely secondary to CHF. There is no significant pleural effusion. When the patient can tolerate the procedure, a PA and lateral chest x-ray would be of value.   Electronically Signed   By: David  Martinique   On: 09/24/2013 15:05   Dg Chest Port 1 View  09/23/2013   CLINICAL DATA:  Evaluate for pneumonia.  EXAM: PORTABLE CHEST - 1 VIEW  COMPARISON:  Chest radiograph 09/17/2013.  FINDINGS: Patient is rotated. Stable cardiac and mediastinal contours given patient rotation. Minimal heterogeneous opacities  bilateral lung bases. No definite pleural effusion or pneumothorax. Regional skeleton is unremarkable.  IMPRESSION: Bibasilar heterogeneous pulmonary opacities favored to represent atelectasis. Infection not excluded. Recommend short-term followup radiograph to ensure resolution.   Electronically Signed   By: Lovey Newcomer M.D.   On: 09/23/2013 10:24   Dg Abd Acute W/chest  09/17/2013   CLINICAL DATA:  Vomiting.  Dehydration.  EXAM: ACUTE ABDOMEN SERIES (ABDOMEN 2 VIEW & CHEST 1 VIEW)  COMPARISON:  One-view chest 04/04/2013  FINDINGS: Mild cardiac enlargement is again noted. There is mild right basilar atelectasis. Mild pulmonary vascular congestion is present without frank edema. No other focal airspace disease is evident. The patient is rotated to the right.  There is mild prominence of a loop of colon extending from the right lower quadrant to the left upper quadrant. Mild gaseous distention of small bowel is evident in the left lower quadrant. Surgical clips are present in the right lower quadrant.  Leftward curvature is present in the lumbar spine. Postoperative changes are noted at the right hip. Remote fractures are present in the left superior and inferior pubic rami.  IMPRESSION: 1. Dilated loop of colon as described with multiple mildly dilated loops of small bowel in the left lower quadrant. This raises the possibility of the developing sigmoid volvulus. There is no evidence for free air. 2. Postsurgical changes in the right lower quadrant. 3. Stable cardiomegaly without failure. 4. Mild atelectasis at the right lung base.   Electronically Signed   By: Lawrence Santiago M.D.   On: 09/17/2013 15:15   Dg Abd Portable 1v  09/24/2013   CLINICAL DATA:  Nausea and vomiting.  EXAM: PORTABLE ABDOMEN - 1 VIEW  COMPARISON:  Portable supine abdominal film dated September 21, 2012  FINDINGS: The lower chest and upper abdomen are not included in the field of view of this study. There are several loops of mildly  distended gas-filled small bowel stacked to the left of midline. There is a small amount of gas within colon to the right of midline. There it is likely a small  amount of gas in the rectosigmoid. The volume of gas overall has not dramatically changed since the previous study. There are numerous vascular clips present in the right mid and lower abdomen. The patient has undergone previous ORIF for a right femoral neck fracture  IMPRESSION: The bowel gas pattern is nonspecific but may reflect a mild ileus or partial distal small bowel obstruction though I favor the former.   Electronically Signed   By: David  [place]   On: 09/24/2013 15:47   Dg Abd Portable 1v  09/21/2013   CLINICAL DATA:  History of small bowel obstruction.  Followup study.  EXAM: PORTABLE ABDOMEN - 1 VIEW  COMPARISON:  CT of the abdomen and pelvis 09/17/2013.  FINDINGS: Tubing is seen projecting over the lower thorax, with the tip in the expected location of the distal esophagus just proximal to the gastroesophageal junction. Gas is noted throughout the colon, including some distal rectal gas. No pathologic distention of small bowel. No gross evidence of pneumoperitoneum on this single supine view of the abdomen. Numerous surgical clips and sutures are seen in the right side of the abdomen. Postoperative changes of ORIF in the right proximal femur are incompletely visualized.  IMPRESSION: 1. Nonobstructive bowel gas pattern. 2. No pneumoperitoneum. 3. Postoperative changes and support apparatus, as above. Please take note of the tubing that projects over the lower thorax which likely represents a nasogastric tube, tip of which is just proximal to the gastroesophageal junction.   Electronically Signed   By: Vinnie Langton M.D.   On: 09/21/2013 10:33    Microbiology: Recent Results (from the past 240 hour(s))  CLOSTRIDIUM DIFFICILE BY PCR     Status: Abnormal   Collection Time    09/23/13  4:00 PM      Result Value Range Status   C  difficile by pcr POSITIVE (*) NEGATIVE Final   Comment: CRITICAL RESULT CALLED TO, READ BACK BY AND VERIFIED WITH:     BULLOCK,T ON 09/23/13 AT 1959 BY LOY,C     Labs: Basic Metabolic Panel:  Recent Labs Lab 09/20/13 0610 09/21/13 0554 09/22/13 0455 09/23/13 0506 09/24/13 0459 09/25/13 0506 09/26/13 0458  NA 151* 148* 141 139 140 139 142  K 3.3* 3.4* 3.5* 3.7 4.5 3.8 3.5*  CL 114* 112 110 107 108 107 107  CO2 27 25 21 20 21 20 22   GLUCOSE 127* 139* 124* 103* 103* 102* 109*  BUN 37* 27* 15 13 10  5* 6  CREATININE 0.92 0.83 0.72 0.72 0.66 0.62 0.70  CALCIUM 8.7 8.6 8.3* 8.1* 8.4 8.2* 8.4  MG  --  2.6*  --   --   --   --   --   PHOS  --  1.3*  --   --   --   --   --    Liver Function Tests: No results found for this basename: AST, ALT, ALKPHOS, BILITOT, PROT, ALBUMIN,  in the last 168 hours No results found for this basename: LIPASE, AMYLASE,  in the last 168 hours No results found for this basename: AMMONIA,  in the last 168 hours CBC:  Recent Labs Lab 09/22/13 0455 09/23/13 0506 09/24/13 0459 09/25/13 0506 09/26/13 0458  WBC 11.5* 14.5* 11.7* 12.3* 13.3*  HGB 12.1 11.9* 12.0 11.2* 11.1*  HCT 37.2 37.0 36.1 34.2* 34.5*  MCV 70.7* 70.7* 71.1* 70.4* 69.8*  PLT 332 327 266 330 396   Cardiac Enzymes: No results found for this basename: CKTOTAL, CKMB, CKMBINDEX, TROPONINI,  in  the last 168 hours BNP: BNP (last 3 results)  Recent Labs  09/26/13 0458  PROBNP 386.6   CBG: No results found for this basename: GLUCAP,  in the last 168 hours     Signed:  Annessa Satre  [facility] 09/26/2013, 2:45 PM

## 2013-09-26 NOTE — Progress Notes (Addendum)
Speech Language Pathology Treatment: Dysphagia   Patient Details Name: [name] MRN: [id number] DOB: 11-29-1919 Today's Date: 09/26/2013 Time: 9628-3662 SLP Time Calculation (min): 20 min  Assessment / Plan / Recommendation Clinical Impression  Ms. Tolliver was seen this AM with daughter in room. Her daughter reports that pt tolerated breakfast well, with mild occasional cough. She was observed with mech soft and thins today with variable delayed cough. Recommend continuing diet as ordered (puree and nectars) and follow up with home health SLP for upgrades if indicated.   HPI HPI: 80 year old woman with history of dementia, Parkinson's disease presented with history of recurrent nausea, vomiting, failure to thrive, poor oral intake with last bowel movement 12/29. Initial evaluation revealed small bowel obstruction.      SLP Plan  Continue with current plan of care    Recommendations Diet recommendations: Dysphagia 1/puree and nectar-thick liquids. Medication Administration: Crushed with puree Supervision: Trained caregiver to feed patient;Full supervision/cueing for compensatory strategies Compensations: Slow rate;Small sips/bites;Multiple dry swallows after each bite/sip Postural Changes and/or Swallow Maneuvers: Seated upright 90 degrees;Upright 30-60 min after meal              Oral Care Recommendations: Oral care BID Follow up Recommendations: 24 hour supervision/assistance;Home Health Plan: Continue with current plan of care    GO    Thank you,  Genene Churn, [place]  [place] 09/26/2013, 4:31 PM

## 2013-10-02 ENCOUNTER — Emergency Department (HOSPITAL_COMMUNITY)
Admission: EM | Admit: 2013-10-02 | Discharge: 2013-10-02 | Disposition: A | Discharge status: Home / Self Care | Payer: PRIVATE HEALTH INSURANCE | Attending: Emergency Medicine | Admitting: Emergency Medicine

## 2013-10-02 ENCOUNTER — Emergency Department (HOSPITAL_COMMUNITY): Payer: PRIVATE HEALTH INSURANCE

## 2013-10-02 ENCOUNTER — Encounter (HOSPITAL_COMMUNITY): Payer: Self-pay | Admitting: Emergency Medicine

## 2013-10-02 DIAGNOSIS — G20 Parkinson's disease: Secondary | ICD-10-CM | POA: Insufficient documentation

## 2013-10-02 DIAGNOSIS — F0280 Dementia in other diseases classified elsewhere without behavioral disturbance: Secondary | ICD-10-CM | POA: Insufficient documentation

## 2013-10-02 DIAGNOSIS — K5900 Constipation, unspecified: Secondary | ICD-10-CM | POA: Insufficient documentation

## 2013-10-02 DIAGNOSIS — Z7982 Long term (current) use of aspirin: Secondary | ICD-10-CM | POA: Insufficient documentation

## 2013-10-02 DIAGNOSIS — I10 Essential (primary) hypertension: Secondary | ICD-10-CM | POA: Insufficient documentation

## 2013-10-02 DIAGNOSIS — R1084 Generalized abdominal pain: Secondary | ICD-10-CM | POA: Insufficient documentation

## 2013-10-02 DIAGNOSIS — R109 Unspecified abdominal pain: Secondary | ICD-10-CM

## 2013-10-02 DIAGNOSIS — Z792 Long term (current) use of antibiotics: Secondary | ICD-10-CM | POA: Insufficient documentation

## 2013-10-02 DIAGNOSIS — R630 Anorexia: Secondary | ICD-10-CM | POA: Insufficient documentation

## 2013-10-02 DIAGNOSIS — G309 Alzheimer's disease, unspecified: Secondary | ICD-10-CM | POA: Insufficient documentation

## 2013-10-02 DIAGNOSIS — Z880 Allergy status to penicillin: Secondary | ICD-10-CM | POA: Insufficient documentation

## 2013-10-02 DIAGNOSIS — R110 Nausea: Secondary | ICD-10-CM | POA: Insufficient documentation

## 2013-10-02 DIAGNOSIS — G20A1 Parkinson's disease without dyskinesia, without mention of fluctuations: Secondary | ICD-10-CM | POA: Insufficient documentation

## 2013-10-02 DIAGNOSIS — Z79899 Other long term (current) drug therapy: Secondary | ICD-10-CM | POA: Insufficient documentation

## 2013-10-02 DIAGNOSIS — I252 Old myocardial infarction: Secondary | ICD-10-CM | POA: Insufficient documentation

## 2013-10-02 DIAGNOSIS — Z853 Personal history of malignant neoplasm of breast: Secondary | ICD-10-CM | POA: Insufficient documentation

## 2013-10-02 DIAGNOSIS — H6120 Impacted cerumen, unspecified ear: Secondary | ICD-10-CM | POA: Insufficient documentation

## 2013-10-02 LAB — CBC WITH DIFFERENTIAL/PLATELET
Basophils Absolute: 0 10*3/uL (ref 0.0–0.1)
Basophils Relative: 0 % (ref 0–1)
Eosinophils Absolute: 0 10*3/uL (ref 0.0–0.7)
Eosinophils Relative: 0 % (ref 0–5)
HCT: 30.8 % — ABNORMAL LOW (ref 36.0–46.0)
Hemoglobin: 10.3 g/dL — ABNORMAL LOW (ref 12.0–15.0)
Lymphocytes Relative: 13 % (ref 12–46)
Lymphs Abs: 1.4 10*3/uL (ref 0.7–4.0)
MCH: 23.5 pg — ABNORMAL LOW (ref 26.0–34.0)
MCHC: 33.4 g/dL (ref 30.0–36.0)
MCV: 70.2 fL — ABNORMAL LOW (ref 78.0–100.0)
Monocytes Absolute: 0.6 10*3/uL (ref 0.1–1.0)
Monocytes Relative: 6 % (ref 3–12)
Neutro Abs: 8.5 10*3/uL — ABNORMAL HIGH (ref 1.7–7.7)
Neutrophils Relative %: 81 % — ABNORMAL HIGH (ref 43–77)
Platelets: 404 10*3/uL — ABNORMAL HIGH (ref 150–400)
RBC: 4.39 MIL/uL (ref 3.87–5.11)
RDW: 17.1 % — ABNORMAL HIGH (ref 11.5–15.5)
WBC: 10.6 10*3/uL — ABNORMAL HIGH (ref 4.0–10.5)

## 2013-10-02 LAB — URINALYSIS, ROUTINE W REFLEX MICROSCOPIC
Bilirubin Urine: NEGATIVE
Glucose, UA: NEGATIVE mg/dL
Hgb urine dipstick: NEGATIVE
Ketones, ur: NEGATIVE mg/dL
Leukocytes, UA: NEGATIVE
Nitrite: NEGATIVE
Protein, ur: NEGATIVE mg/dL
Specific Gravity, Urine: 1.02 (ref 1.005–1.030)
Urobilinogen, UA: 0.2 mg/dL (ref 0.0–1.0)
pH: 5.5 (ref 5.0–8.0)

## 2013-10-02 LAB — OCCULT BLOOD, POC DEVICE: Fecal Occult Bld: NEGATIVE

## 2013-10-02 LAB — BASIC METABOLIC PANEL
BUN: 6 mg/dL (ref 6–23)
CO2: 28 mEq/L (ref 19–32)
Calcium: 8.1 mg/dL — ABNORMAL LOW (ref 8.4–10.5)
Chloride: 105 mEq/L (ref 96–112)
Creatinine, Ser: 0.9 mg/dL (ref 0.50–1.10)
GFR calc Af Amer: 63 mL/min — ABNORMAL LOW (ref >90)
GFR calc non Af Amer: 54 mL/min — ABNORMAL LOW (ref >90)
Glucose, Bld: 98 mg/dL (ref 70–99)
Potassium: 3.6 mEq/L — ABNORMAL LOW (ref 3.7–5.3)
Sodium: 143 mEq/L (ref 137–147)

## 2013-10-02 LAB — CG4 I-STAT (LACTIC ACID): Lactic Acid, Venous: 1.5 mmol/L (ref 0.5–2.2)

## 2013-10-02 MED ORDER — ALPRAZOLAM 0.5 MG PO TABS
0.5000 mg | ORAL_TABLET | Freq: Once | ORAL | Status: AC
  Administered 2013-10-02: 0.5 mg via ORAL
  Filled 2013-10-02: qty 1

## 2013-10-02 MED ORDER — CARBAMIDE PEROXIDE 6.5 % OT SOLN: 5.0000 [drp] | Freq: Two times a day (BID) | OTIC | Status: DC

## 2013-10-02 MED ORDER — LACTULOSE 10 GM/15ML PO SOLN: 10.0000 g | Freq: Two times a day (BID) | ORAL | Status: DC | PRN

## 2013-10-02 NOTE — ED Provider Notes (Signed)
CSN: [id number]     Arrival date & time 10/02/13  1343 History   First MD Initiated Contact with Patient 10/02/13 1357     Chief Complaint  Patient presents with  . Abdominal Pain   Patient is a 80 y.o. female presenting with abdominal pain. The history is provided by the patient and a relative. The history is limited by the condition of the patient.  Abdominal Pain Pain location:  Generalized Pain quality: aching   Pain severity:  Moderate Onset quality:  Gradual Duration: past several days. Timing:  Constant Progression:  Worsening Chronicity:  Recurrent Relieved by:  Nothing Worsened by:  Nothing tried Associated symptoms: constipation and nausea   Associated symptoms: no fever and no vomiting    Pt presents from home for abd pain and nausea Per daughter, pt has had increased nausea, decreased PO and constipation for past 2 days No vomiting No fever No new medications have been started She has h/o parkinsons and is at her baseline mental status Past Medical History  Diagnosis Date  . Hypertension   . Heart attack   . Parkinson disease   . Cancer     breast  . Alzheimer's dementia    Past Surgical History  Procedure Laterality Date  . Breast surgery    . Abdominal hysterectomy    . Mastectomy    . Small intestine removed    . Tonsillectomy    . Cholecystectomy    . Colon surgery    . Pelvic fracture surgery    . Leg surgery    . Arm surgery    . Hip pinning,cannulated Right 02/01/2013    Procedure: CANNULATED HIP PINNING;  Surgeon: Sanjuana Kava, MD;  Location: [facility];  Service: Orthopedics;  Laterality: Right;   Family History  Problem Relation Age of Onset  . Aneurysm Mother   . Diabetes Father    History  Substance Use Topics  . Smoking status: Never Smoker   . Smokeless tobacco: Never Used  . Alcohol Use: No   OB History   Grav Para Term Preterm Abortions TAB SAB Ect Mult Living                 Review of Systems  Unable to perform ROS: Dementia   Constitutional: Negative for fever.  Gastrointestinal: Positive for nausea, abdominal pain and constipation. Negative for vomiting.    Allergies  Penicillins  Home Medications   Current Outpatient Rx  Name  Route  Sig  Dispense  Refill  . acetaminophen (TYLENOL) 500 MG tablet   Oral   Take 500 mg by mouth at bedtime. For pain         . ALPRAZolam (XANAX) 0.5 MG tablet   Oral   Take 0.5 mg by mouth daily.         Marland Kitchen aspirin EC 81 MG tablet   Oral   Take 1 tablet (81 mg total) by mouth daily.   30 tablet   0   . benztropine (COGENTIN) 1 MG tablet   Oral   Take 1 tablet (1 mg total) by mouth daily.   30 tablet   0   . bumetanide (BUMEX) 0.5 MG tablet   Oral   Take 1 tablet (0.5 mg total) by mouth daily as needed (swelling).   30 tablet   0   . citalopram (CELEXA) 20 MG tablet   Oral   Take 1 tablet (20 mg total) by mouth every morning.   30 tablet  0   . clotrimazole (MYCELEX) 10 MG troche   Oral   Take 10 mg by mouth 5 (five) times daily. Starting 09/30/13 x 5 days.         . haloperidol (HALDOL) 2 MG/ML solution   Oral   Take 1 mg by mouth 2 (two) times daily as needed for agitation.         Marland Kitchen levofloxacin (LEVAQUIN) 750 MG tablet   Oral   Take 1 tablet (750 mg total) by mouth daily.   5 tablet   0   . metoprolol (LOPRESSOR) 50 MG tablet   Oral   Take 0.5 tablets (25 mg total) by mouth 2 (two) times daily.   60 tablet   1   . polyethylene glycol (MIRALAX / GLYCOLAX) packet   Oral   Take 17 g by mouth daily.   14 each   0   . rivastigmine (EXELON) 9.5 mg/24hr   Transdermal   Place 1 patch (9.5 mg total) onto the skin daily. *Remove and discard used patches*   30 patch   12   . vancomycin (VANCOCIN) 50 mg/mL oral solution   Oral   Take 2.5 mLs (125 mg total) by mouth every 6 (six) hours. For 14 days   140 mL   0   . zolpidem (AMBIEN) 10 MG tablet   Oral   Take 10 mg by mouth at bedtime as needed for sleep.          BP 95/54   Pulse 105  Temp(Src) 97.9 F (36.6 C) (Oral)  Resp 20  SpO2 98% Physical Exam CONSTITUTIONAL: elderly, frail HEAD: Normocephalic/atraumatic EYES: EOMI ENMT: Mucous membranes moist NECK: supple no meningeal signs CV: no murmurs/rubs/gallops noted LUNGS: Lungs are clear to auscultation bilaterally, no apparent distress ABDOMEN: soft, mild diffuse tenderness, +BS throughout GU:no cva tenderness Rectal - small amt of stool in vault, no blood or melena noted, no stool impaction noted, chaperone present NEURO: Pt is awake/alert, moves all extremitiesx4, she has tremor noted and appears confused (baseline) EXTREMITIES: pulses normal, full ROM SKIN: warm, color normal PSYCH: no abnormalities of mood noted  ED Course  Procedures (including critical care time)  3:36 PM Family reports they are concerned for bowel obstruction though she is not as ill as previous evaluation around new years Pt currently stable D/w dr Betsey Holiday, will f/u on imaging/urinalysis.  If negative and she can take PO, stable for d/c home at that time  [place]ner - Abnormal; Notable for the following:    Potassium 3.6 (*)    Calcium 8.1 (*)    GFR calc non Af Amer 54 (*)    GFR calc Af Amer 63 (*)    All other components within normal limits  CBC WITH DIFFERENTIAL - Abnormal; Notable for the following:    WBC 10.6 (*)    Hemoglobin 10.3 (*)    HCT 30.8 (*)    MCV 70.2 (*)    MCH 23.5 (*)    RDW 17.1 (*)    Platelets 404 (*)    Neutrophils Relative % 81 (*)    Neutro Abs 8.5 (*)    All other components within normal limits  URINALYSIS, ROUTINE W REFLEX MICROSCOPIC  CG4 I-STAT (LACTIC ACID)   Imaging Review No results found.  EKG Interpretation    Date/Time:  Thursday October 02 2013 14:19:41 EST Ventricular Rate:  67 PR Interval:    QRS Duration: 74 QT  Interval:  446 QTC Calculation: 471 R Axis:   22 Text Interpretation:  indeterminate, artifact noted  Septal infarct , age undetermined Abnormal ECG Confirmed by Christy Gentles  MD, Amaury Kuzel [phone]) on 10/02/2013 2:29:28 PM            MDM  No diagnosis found. Nursing notes including past medical history and social history reviewed and considered in documentation Previous records reviewed and considered - recent admission for bowel obstruction Labs/vital reviewed and considered     Sharyon Cable, MD 10/02/13 1537

## 2013-10-02 NOTE — ED Notes (Signed)
Patient's family, patient has been unable to go to the bathroom since Thursday. She has been complaining of abdominal pain, ear pain, and her mouth. Daughter states, "she has tried to make stool, but every time she goes to the bathroom she is unable to make stool." Daughter also states patient is drinking lots of water, but she is only urinating once a day. Patient has a history of bowel obstruction per daughter's statement.

## 2013-10-02 NOTE — Discharge Instructions (Signed)
Constipation, Adult Constipation is when a person has fewer than 3 bowel movements a week; has difficulty having a bowel movement; or has stools that are dry, hard, or larger than normal. As people grow older, constipation is more common. If you try to fix constipation with medicines that make you have a bowel movement (laxatives), the problem may get worse. Long-term laxative use may cause the muscles of the colon to become weak. A low-fiber diet, not taking in enough fluids, and taking certain medicines may make constipation worse. CAUSES   Certain medicines, such as antidepressants, pain medicine, iron supplements, antacids, and water pills.   Certain diseases, such as diabetes, irritable bowel syndrome (IBS), thyroid disease, or depression.   Not drinking enough water.   Not eating enough fiber-rich foods.   Stress or travel.  Lack of physical activity or exercise.  Not going to the restroom when there is the urge to have a bowel movement.  Ignoring the urge to have a bowel movement.  Using laxatives too much. SYMPTOMS   Having fewer than 3 bowel movements a week.   Straining to have a bowel movement.   Having hard, dry, or larger than normal stools.   Feeling full or bloated.   Pain in the lower abdomen.  Not feeling relief after having a bowel movement. DIAGNOSIS  Your caregiver will take a medical history and perform a physical exam. Further testing may be done for severe constipation. Some tests may include:   A barium enema X-ray to examine your rectum, colon, and sometimes, your small intestine.  A sigmoidoscopy to examine your lower colon.  A colonoscopy to examine your entire colon. TREATMENT  Treatment will depend on the severity of your constipation and what is causing it. Some dietary treatments include drinking more fluids and eating more fiber-rich foods. Lifestyle treatments may include regular exercise. If these diet and lifestyle recommendations  do not help, your caregiver may recommend taking over-the-counter laxative medicines to help you have bowel movements. Prescription medicines may be prescribed if over-the-counter medicines do not work.  HOME CARE INSTRUCTIONS   Increase dietary fiber in your diet, such as fruits, vegetables, whole grains, and beans. Limit high-fat and processed sugars in your diet, such as [place] fries, hamburgers, cookies, candies, and soda.   A fiber supplement may be added to your diet if you cannot get enough fiber from foods.   Drink enough fluids to keep your urine clear or pale yellow.   Exercise regularly or as directed by your caregiver.   Go to the restroom when you have the urge to go. Do not hold it.  Only take medicines as directed by your caregiver. Do not take other medicines for constipation without talking to your caregiver first. [place] IF:   You have bright red blood in your stool.   Your constipation lasts for more than 4 days or gets worse.   You have abdominal or rectal pain.   You have thin, pencil-like stools.  You have unexplained weight loss. MAKE SURE YOU:   Understand these instructions.  Will watch your condition.  Will get help right away if you are not doing well or get worse. Document Released: 06/02/2004 Document Revised: 11/27/2011 Document Reviewed: 06/16/2013 [facility] Patient Information 2014 [place], [place].  Cerumen Impaction A cerumen impaction is when the wax in your ear forms a plug. This plug usually causes reduced hearing. Sometimes it also causes an earache or dizziness. Removing a cerumen impaction can be  difficult and painful. The wax sticks to the ear canal. The canal is sensitive and bleeds easily. If you try to remove a heavy wax buildup with a cotton tipped swab, you may push it in further. Irrigation with water, suction, and small ear curettes may be used to clear out the wax. If the impaction is fixed to the skin in  the ear canal, ear drops may be needed for a few days to loosen the wax. People who build up a lot of wax frequently can use ear wax removal products available in your local drugstore. SEEK MEDICAL CARE IF:  You develop an earache, increased hearing loss, or marked dizziness. Document Released: 10/12/2004 Document Revised: 11/27/2011 Document Reviewed: 12/02/2009 [facility] Patient Information 2014 [place], [place].

## 2013-10-02 NOTE — ED Provider Notes (Signed)
Patient signed out to me to followup on imaging and urinalysis. The patient seen for abdominal discomfort and possible constipation. As she does have a recent history of bowel obstruction, x-ray was obtained. There is no evidence of obstruction, but patient does have moderate stool burden. Patient has been using MiraLax without improvement. This will be changed to lactulose.  Family also indicates to me that the patient has been complaining of right ear discomfort. I did examine the ear and she has a cerumen impaction in the right ear. She will use Debrox in the ear to help soften the heart and very dry cerumen that will not likely respond to irrigation.  Orpah Greek, MD 10/02/13 [phone]

## 2013-10-02 NOTE — ED Notes (Signed)
Pt c/o left side abd pain and "trouble using the bathroom", pt daughter reports "muddy stool yesterday".

## 2013-10-02 NOTE — ED Notes (Signed)
Patient transported to X-ray 

## 2013-10-02 NOTE — ED Notes (Signed)
Patient becoming anxious "picking at blankets and IV site." IV wrapped with kerlix. Per family patt has not had home medication of xanax today of 0.5mg . Dr Betsey Holiday made aware.

## 2014-02-11 ENCOUNTER — Other Ambulatory Visit (HOSPITAL_COMMUNITY): Payer: Self-pay | Admitting: Family Medicine

## 2014-02-11 ENCOUNTER — Ambulatory Visit (HOSPITAL_COMMUNITY)
Admission: RE | Admit: 2014-02-11 | Discharge: 2014-02-11 | Disposition: A | Discharge status: Home / Self Care | Payer: PRIVATE HEALTH INSURANCE | Source: Ambulatory Visit | Attending: Family Medicine | Admitting: Family Medicine

## 2014-02-11 DIAGNOSIS — S0083XA Contusion of other part of head, initial encounter: Secondary | ICD-10-CM | POA: Insufficient documentation

## 2014-02-11 DIAGNOSIS — W19XXXA Unspecified fall, initial encounter: Secondary | ICD-10-CM | POA: Insufficient documentation

## 2014-02-11 DIAGNOSIS — K029 Dental caries, unspecified: Secondary | ICD-10-CM | POA: Insufficient documentation

## 2014-02-11 DIAGNOSIS — S0003XA Contusion of scalp, initial encounter: Secondary | ICD-10-CM | POA: Insufficient documentation

## 2014-02-11 DIAGNOSIS — S1093XA Contusion of unspecified part of neck, initial encounter: Principal | ICD-10-CM

## 2014-02-11 DIAGNOSIS — G319 Degenerative disease of nervous system, unspecified: Secondary | ICD-10-CM | POA: Insufficient documentation

## 2014-02-11 DIAGNOSIS — Y929 Unspecified place or not applicable: Secondary | ICD-10-CM | POA: Insufficient documentation

## 2014-05-11 IMAGING — CT CT HEAD W/O CM
1 of 3 series · 16 of 30 positions shown, 20 images · non-contrast
Comparison: 02/16/2011

CLINICAL DATA: Dizziness and weakness.

CT HEAD WITHOUT CONTRAST
TECHNIQUE: Contiguous axial images were obtained from the base of
the skull through the vertex without contrast.

[Series 4: headtrauma 2.4 h60s · axial · 0.43mm/px · z∈[+100,+226]mm · 16 of 60 slices shown, 20 images]
[im 4/60  brain]
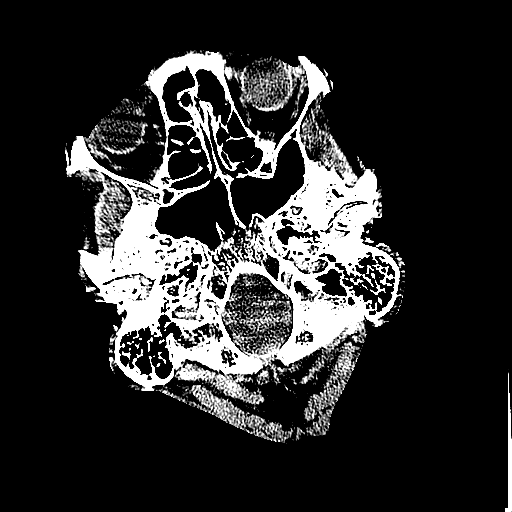
[im 4/60  bone]
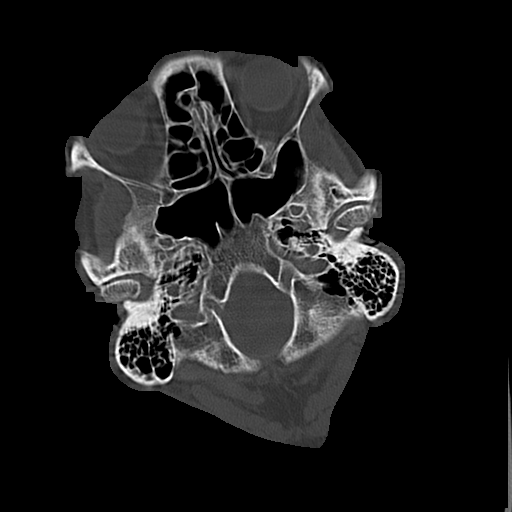
[im 7/60  brain]
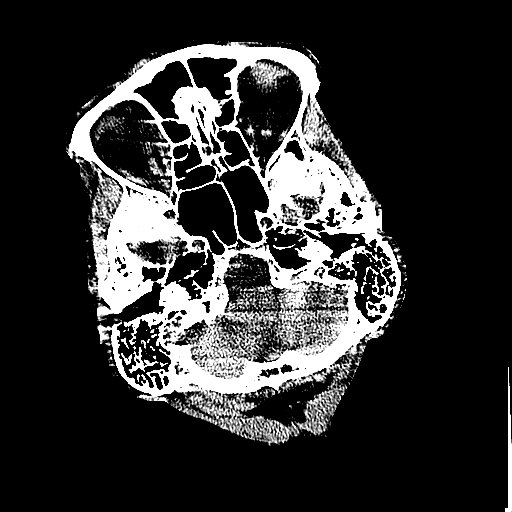
[im 11/60  brain]
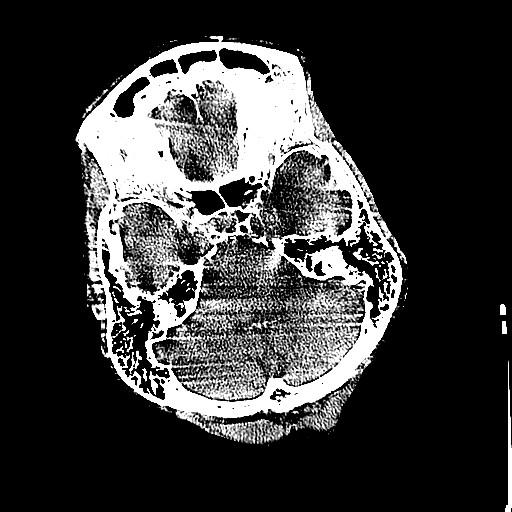
[im 14/60  brain]
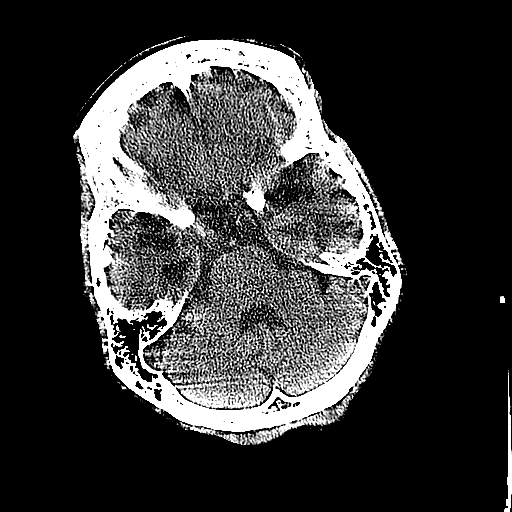
[im 18/60  brain]
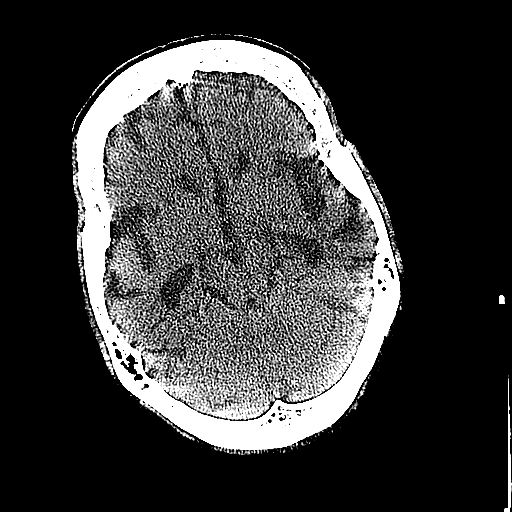
[im 18/60  bone]
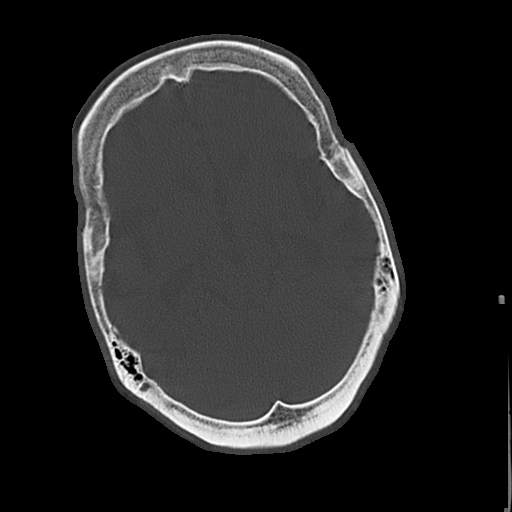
[im 21/60  brain]
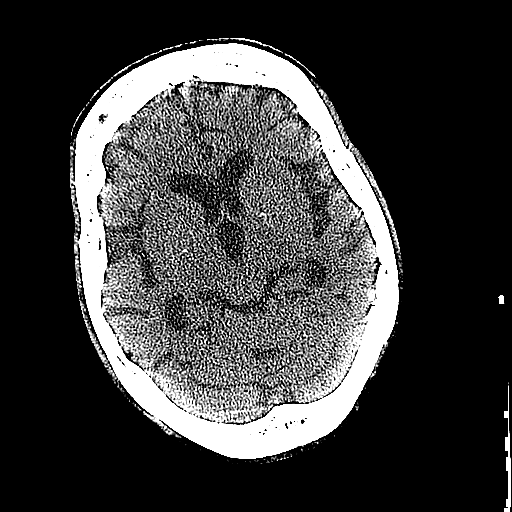
[im 25/60  brain]
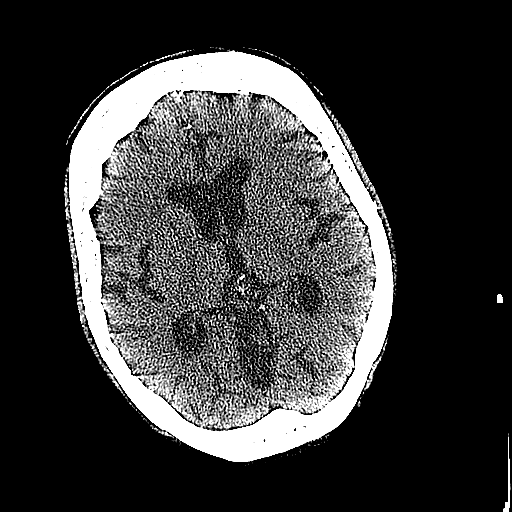
[im 28/60  brain]
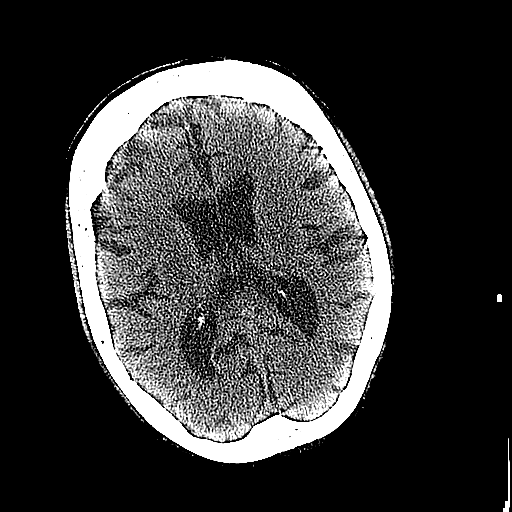
[im 32/60  brain]
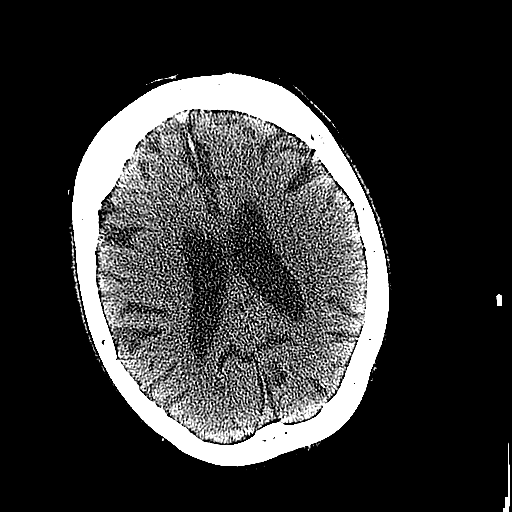
[im 32/60  bone]
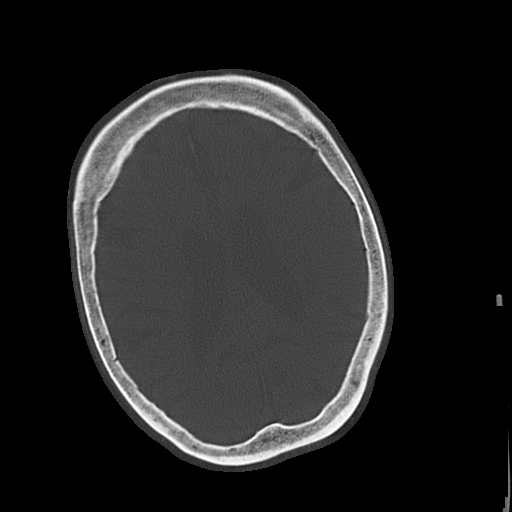
[im 35/60  brain]
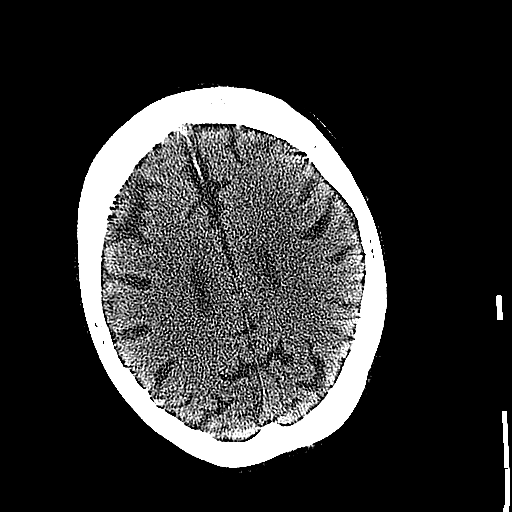
[im 39/60  brain]
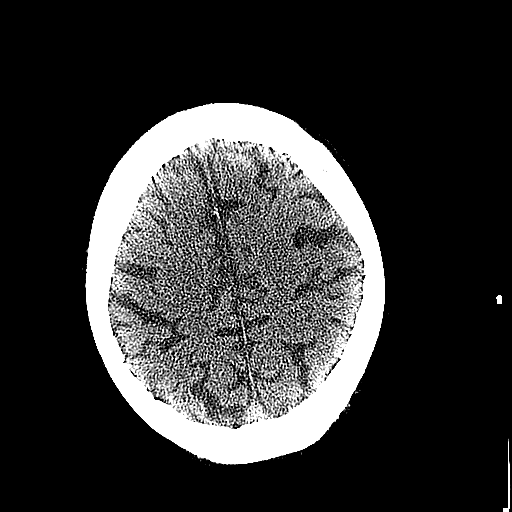
[im 42/60  brain]
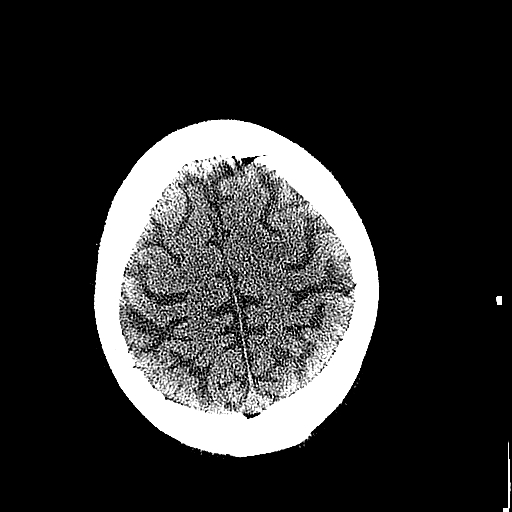
[im 46/60  brain]
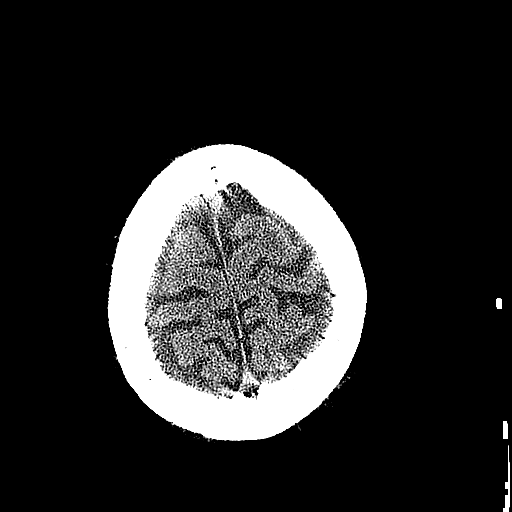
[im 46/60  bone]
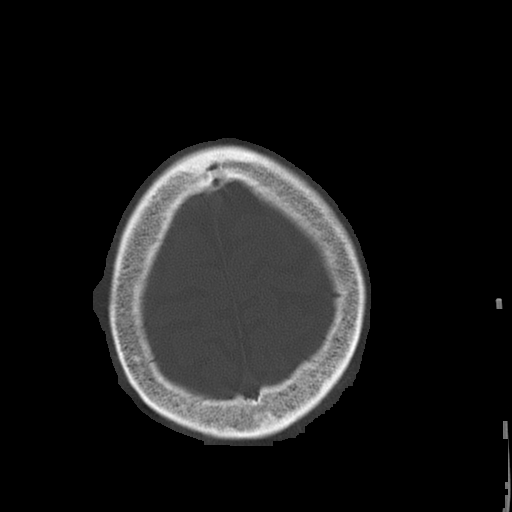
[im 49/60  brain]
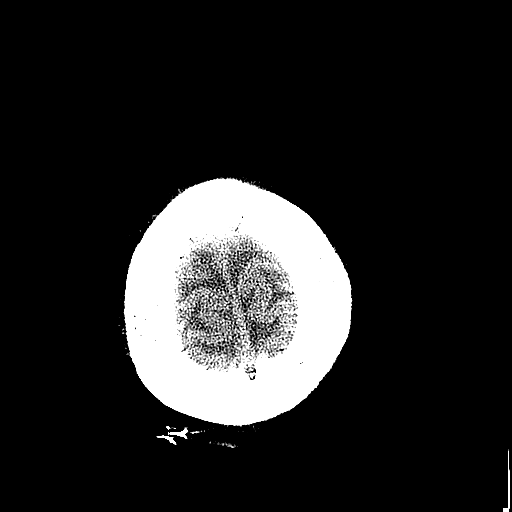
[im 53/60  brain]
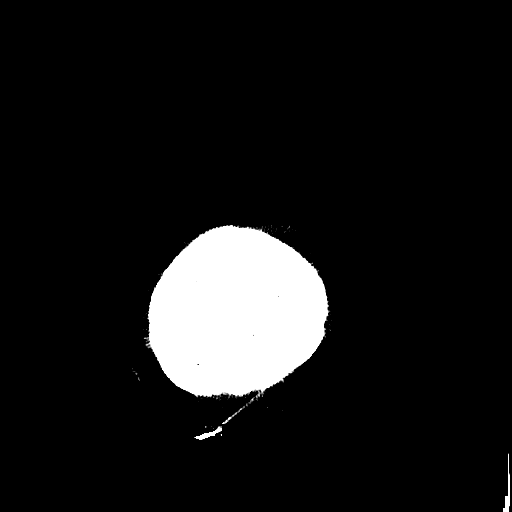
[im 56/60  brain]
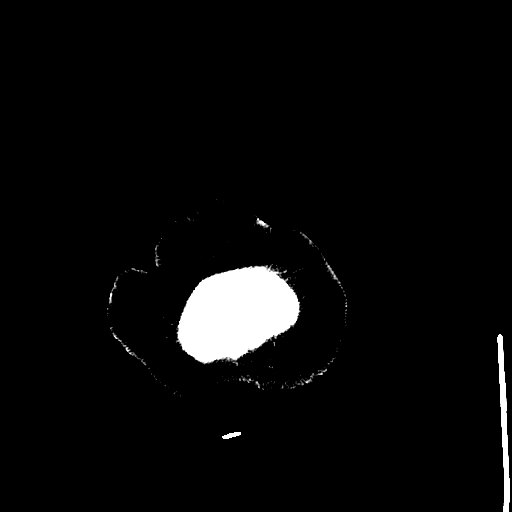

[16 of 30 positions shown; findings below may reference images not displayed]

FINDINGS: Motion artifact limits the study.  No obvious mass
effect, midline shift, or acute intracranial hemorrhage.  Mastoid
air cells and visualized paranasal sinuses are clear.  Cranium is
intact.  Global atrophy and chronic ischemic changes are not
significantly changed.
IMPRESSION: No acute intracranial pathology.

## 2014-05-11 IMAGING — CR DG CHEST 1V
1 series · 1 of 1 positions shown · non-contrast
Comparison: 02/16/2011.

CLINICAL DATA: Cough.  Infiltrates.  CHF.  Free air.

CHEST - 1 VIEW

[view not recorded]
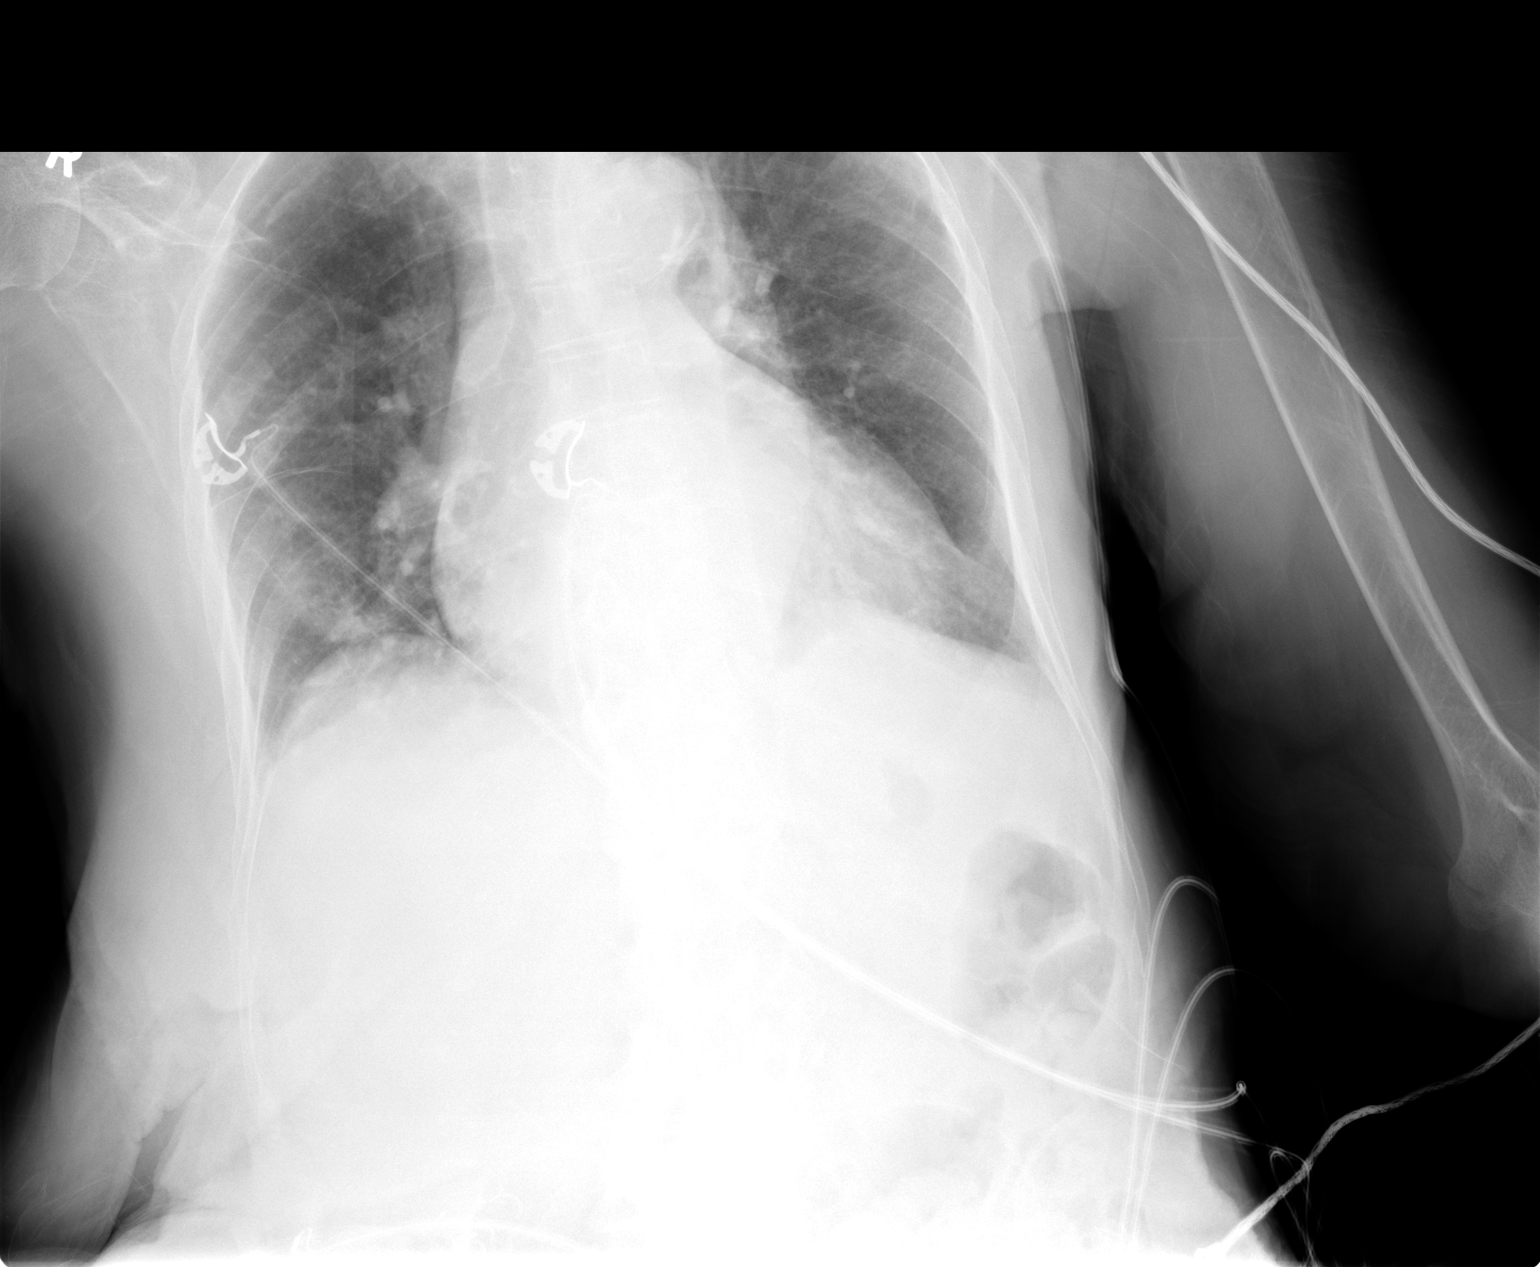

[1 of 1 positions shown; findings below may reference images not displayed]

FINDINGS: Low volume chest.  Patchy density is present at the right
lung base which may represent atelectasis or airspace disease.  No
free air underneath the hemidiaphragms identified.  Low volumes
accentuate the size of the cardiopericardial silhouette.  Tortuous
thoracic aorta with aortic atherosclerosis.  Left lung apex
excluded from view.
IMPRESSION: Low volume chest with patchy right basilar density which may
represent airspace disease or atelectasis.

## 2014-05-21 IMAGING — CR DG CHEST 1V
1 series · 1 of 1 positions shown · non-contrast
Comparison: Chest x-ray 06/30/2012.

CLINICAL DATA: Fatigue.

CHEST - 1 VIEW

[view not recorded]
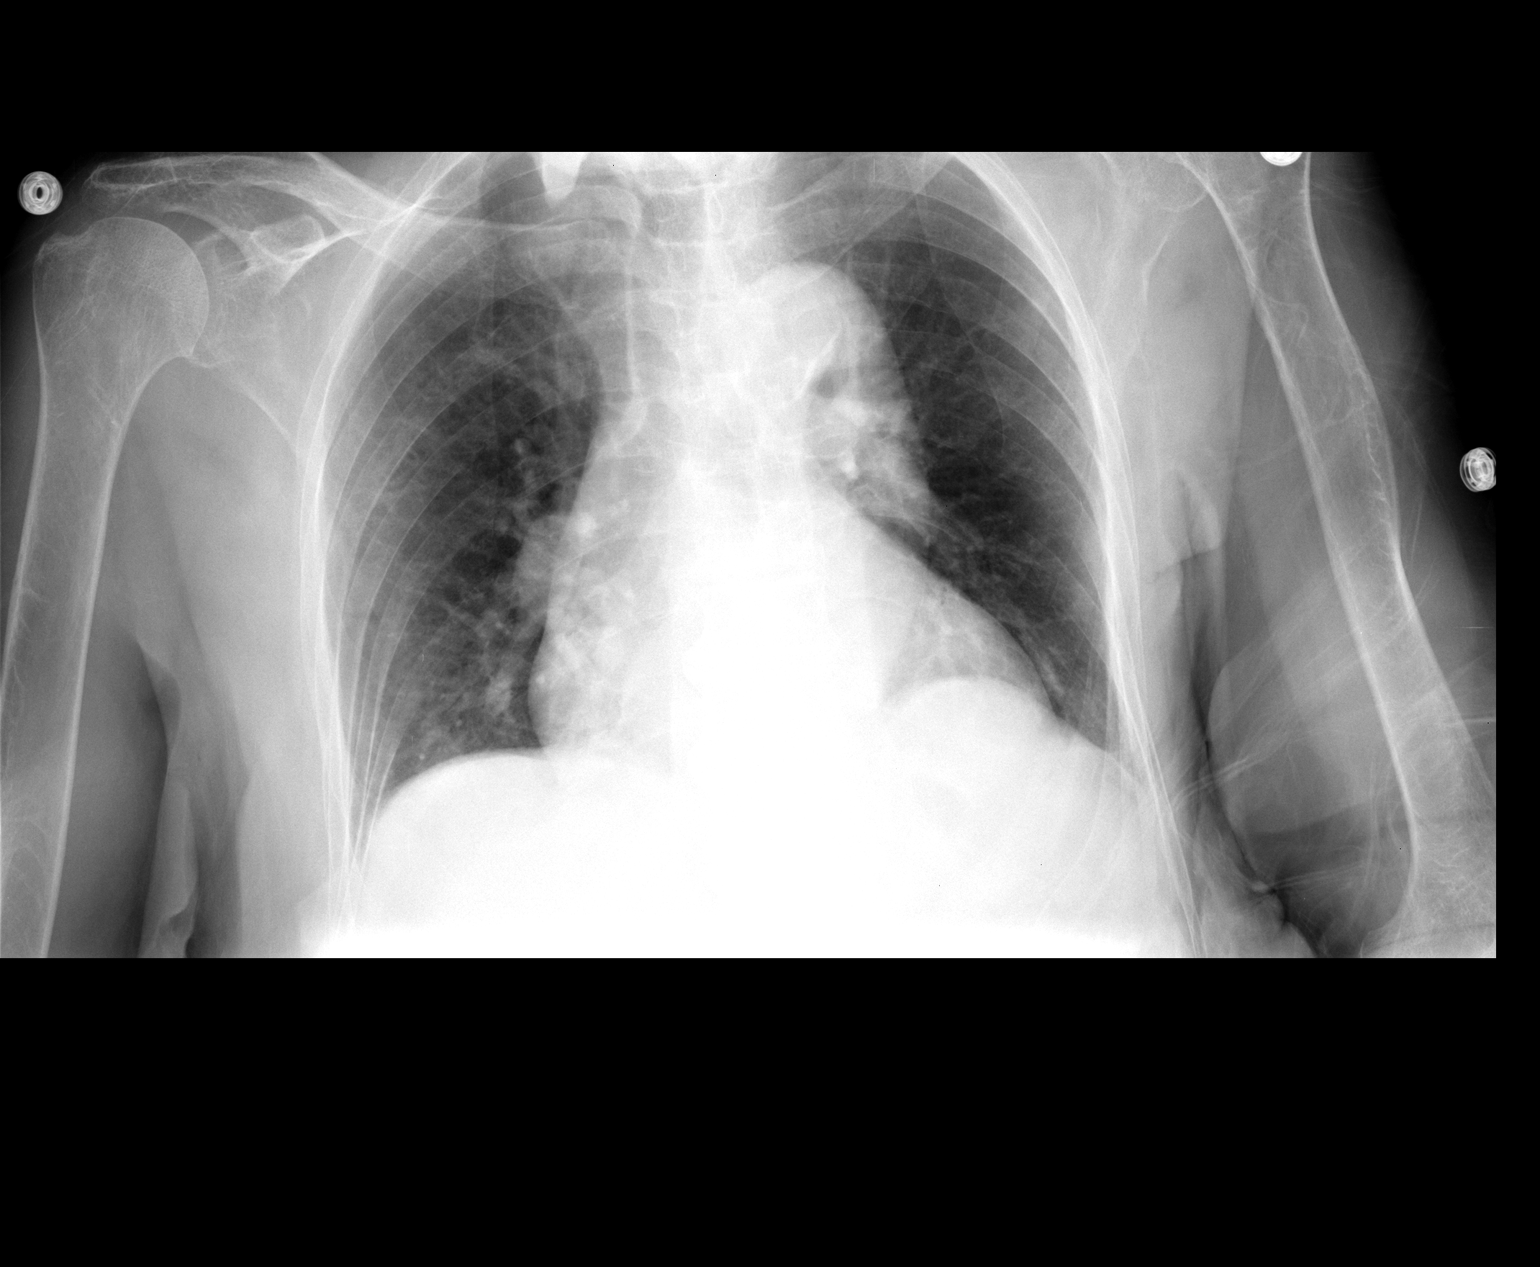

[1 of 1 positions shown; findings below may reference images not displayed]

FINDINGS: Lung volumes are low. Ill-defined medial right basilar
opacity.  No pleural effusions.  Pulmonary vasculature is normal.
Mild cardiomegaly is unchanged. The patient is rotated to the right
on today's exam, resulting in distortion of the mediastinal
contours and reduced diagnostic sensitivity and specificity for
mediastinal pathology.  Atherosclerosis in the thoracic aorta.
Unusual opacity projecting over the right apex is favored to
represent overlying soft tissues.
IMPRESSION: 1.  Low lung volumes with ill-defined medial right basilar opacity
which may represent atelectasis and/or consolidation.
2.  Mild cardiomegaly is unchanged.
3.  Atherosclerosis.

## 2014-12-11 DIAGNOSIS — C50912 Malignant neoplasm of unspecified site of left female breast: Secondary | ICD-10-CM | POA: Diagnosis not present

## 2014-12-14 DIAGNOSIS — G309 Alzheimer's disease, unspecified: Secondary | ICD-10-CM | POA: Diagnosis not present

## 2014-12-14 DIAGNOSIS — S0990XA Unspecified injury of head, initial encounter: Secondary | ICD-10-CM | POA: Diagnosis not present

## 2014-12-14 DIAGNOSIS — Z0000 Encounter for general adult medical examination without abnormal findings: Secondary | ICD-10-CM | POA: Diagnosis not present

## 2014-12-14 DIAGNOSIS — I890 Lymphedema, not elsewhere classified: Secondary | ICD-10-CM | POA: Diagnosis not present

## 2015-01-06 ENCOUNTER — Observation Stay (HOSPITAL_COMMUNITY): Payer: Medicare Other

## 2015-01-06 ENCOUNTER — Inpatient Hospital Stay (HOSPITAL_COMMUNITY)
Admission: EM | Admit: 2015-01-06 | Discharge: 2015-01-08 | DRG: 293 | Disposition: A | Discharge status: Home / Self Care | Payer: Medicare Other | Attending: Internal Medicine | Admitting: Internal Medicine

## 2015-01-06 ENCOUNTER — Encounter (HOSPITAL_COMMUNITY): Payer: Self-pay

## 2015-01-06 ENCOUNTER — Emergency Department (HOSPITAL_COMMUNITY): Payer: Medicare Other

## 2015-01-06 DIAGNOSIS — G4700 Insomnia, unspecified: Secondary | ICD-10-CM | POA: Diagnosis not present

## 2015-01-06 DIAGNOSIS — G20 Parkinson's disease: Secondary | ICD-10-CM | POA: Diagnosis present

## 2015-01-06 DIAGNOSIS — D509 Iron deficiency anemia, unspecified: Secondary | ICD-10-CM | POA: Diagnosis not present

## 2015-01-06 DIAGNOSIS — F0391 Unspecified dementia with behavioral disturbance: Secondary | ICD-10-CM | POA: Diagnosis not present

## 2015-01-06 DIAGNOSIS — R0600 Dyspnea, unspecified: Secondary | ICD-10-CM | POA: Diagnosis not present

## 2015-01-06 DIAGNOSIS — I509 Heart failure, unspecified: Secondary | ICD-10-CM

## 2015-01-06 DIAGNOSIS — F03918 Unspecified dementia, unspecified severity, with other behavioral disturbance: Secondary | ICD-10-CM | POA: Diagnosis present

## 2015-01-06 DIAGNOSIS — Z90710 Acquired absence of both cervix and uterus: Secondary | ICD-10-CM

## 2015-01-06 DIAGNOSIS — G309 Alzheimer's disease, unspecified: Secondary | ICD-10-CM | POA: Diagnosis present

## 2015-01-06 DIAGNOSIS — I5033 Acute on chronic diastolic (congestive) heart failure: Principal | ICD-10-CM | POA: Diagnosis present

## 2015-01-06 DIAGNOSIS — J988 Other specified respiratory disorders: Secondary | ICD-10-CM | POA: Diagnosis not present

## 2015-01-06 DIAGNOSIS — I252 Old myocardial infarction: Secondary | ICD-10-CM | POA: Diagnosis not present

## 2015-01-06 DIAGNOSIS — I10 Essential (primary) hypertension: Secondary | ICD-10-CM

## 2015-01-06 DIAGNOSIS — R0602 Shortness of breath: Secondary | ICD-10-CM | POA: Diagnosis not present

## 2015-01-06 DIAGNOSIS — F418 Other specified anxiety disorders: Secondary | ICD-10-CM

## 2015-01-06 DIAGNOSIS — F0280 Dementia in other diseases classified elsewhere without behavioral disturbance: Secondary | ICD-10-CM | POA: Diagnosis not present

## 2015-01-06 DIAGNOSIS — Z853 Personal history of malignant neoplasm of breast: Secondary | ICD-10-CM

## 2015-01-06 DIAGNOSIS — Z9049 Acquired absence of other specified parts of digestive tract: Secondary | ICD-10-CM | POA: Diagnosis not present

## 2015-01-06 DIAGNOSIS — Z880 Allergy status to penicillin: Secondary | ICD-10-CM

## 2015-01-06 DIAGNOSIS — G20A1 Parkinson's disease without dyskinesia, without mention of fluctuations: Secondary | ICD-10-CM | POA: Diagnosis present

## 2015-01-06 LAB — TROPONIN I: Troponin I: <0.03 ng/mL (ref <0.031)

## 2015-01-06 LAB — LACTIC ACID, PLASMA
Lactic Acid, Venous: 1.3 mmol/L (ref 0.5–2.0)
Lactic Acid, Venous: 1.1 mmol/L (ref 0.5–2.0)

## 2015-01-06 LAB — BASIC METABOLIC PANEL
Anion gap: 5 (ref 5–15)
BUN: 21 mg/dL (ref 6–23)
CO2: 29 mmol/L (ref 19–32)
Calcium: 8.8 mg/dL (ref 8.4–10.5)
Chloride: 109 mmol/L (ref 96–112)
Creatinine, Ser: 0.95 mg/dL (ref 0.50–1.10)
GFR calc Af Amer: 58 mL/min — ABNORMAL LOW (ref >90)
GFR calc non Af Amer: 50 mL/min — ABNORMAL LOW (ref >90)
Glucose, Bld: 87 mg/dL (ref 70–99)
Potassium: 3.9 mmol/L (ref 3.5–5.1)
Sodium: 143 mmol/L (ref 135–145)

## 2015-01-06 LAB — URINALYSIS, ROUTINE W REFLEX MICROSCOPIC
Bilirubin Urine: NEGATIVE
Glucose, UA: NEGATIVE mg/dL
Ketones, ur: NEGATIVE mg/dL
Leukocytes, UA: NEGATIVE
Nitrite: NEGATIVE
Protein, ur: NEGATIVE mg/dL
Specific Gravity, Urine: 1.025 (ref 1.005–1.030)
Urobilinogen, UA: 0.2 mg/dL (ref 0.0–1.0)
pH: 5.5 (ref 5.0–8.0)

## 2015-01-06 LAB — CBC WITH DIFFERENTIAL/PLATELET
Basophils Absolute: 0 10*3/uL (ref 0.0–0.1)
Basophils Relative: 0 % (ref 0–1)
Eosinophils Absolute: 0.2 10*3/uL (ref 0.0–0.7)
Eosinophils Relative: 2 % (ref 0–5)
HCT: 35.2 % — ABNORMAL LOW (ref 36.0–46.0)
Hemoglobin: 10.6 g/dL — ABNORMAL LOW (ref 12.0–15.0)
Lymphocytes Relative: 24 % (ref 12–46)
Lymphs Abs: 1.9 10*3/uL (ref 0.7–4.0)
MCH: 23 pg — ABNORMAL LOW (ref 26.0–34.0)
MCHC: 30.1 g/dL (ref 30.0–36.0)
MCV: 76.5 fL — ABNORMAL LOW (ref 78.0–100.0)
Monocytes Absolute: 0.7 10*3/uL (ref 0.1–1.0)
Monocytes Relative: 8 % (ref 3–12)
Neutro Abs: 5.3 10*3/uL (ref 1.7–7.7)
Neutrophils Relative %: 66 % (ref 43–77)
Platelets: 281 10*3/uL (ref 150–400)
RBC: 4.6 MIL/uL (ref 3.87–5.11)
RDW: 17.2 % — ABNORMAL HIGH (ref 11.5–15.5)
WBC: 8.1 10*3/uL (ref 4.0–10.5)

## 2015-01-06 LAB — URINE MICROSCOPIC-ADD ON

## 2015-01-06 LAB — BRAIN NATRIURETIC PEPTIDE: B Natriuretic Peptide: 691 pg/mL — ABNORMAL HIGH (ref 0.0–100.0)

## 2015-01-06 LAB — RAPID STREP SCREEN (MED CTR MEBANE ONLY): Streptococcus, Group A Screen (Direct): NEGATIVE

## 2015-01-06 MED ORDER — ONDANSETRON HCL 4 MG PO TABS: 4.0000 mg | ORAL_TABLET | Freq: Four times a day (QID) | ORAL | Status: DC | PRN

## 2015-01-06 MED ORDER — LACTULOSE 10 GM/15ML PO SOLN: 10.0000 g | Freq: Two times a day (BID) | ORAL | Status: DC | PRN

## 2015-01-06 MED ORDER — HALOPERIDOL LACTATE 2 MG/ML PO CONC
1.0000 mg | Freq: Two times a day (BID) | ORAL | Status: DC | PRN
  Filled 2015-01-06: qty 0.5

## 2015-01-06 MED ORDER — SODIUM CHLORIDE 0.9 % IV BOLUS (SEPSIS)
250.0000 mL | Freq: Once | INTRAVENOUS | Status: AC
  Administered 2015-01-06: 250 mL via INTRAVENOUS

## 2015-01-06 MED ORDER — ZOLPIDEM TARTRATE 5 MG PO TABS
5.0000 mg | ORAL_TABLET | Freq: Every evening | ORAL | Status: DC | PRN
  Administered 2015-01-06 – 2015-01-07 (×2): 5 mg via ORAL
  Filled 2015-01-06 (×2): qty 1

## 2015-01-06 MED ORDER — ALPRAZOLAM 0.5 MG PO TABS
0.5000 mg | ORAL_TABLET | Freq: Every day | ORAL | Status: DC
  Administered 2015-01-06 – 2015-01-08 (×3): 0.5 mg via ORAL
  Filled 2015-01-06 (×3): qty 1

## 2015-01-06 MED ORDER — ASPIRIN EC 81 MG PO TBEC
81.0000 mg | DELAYED_RELEASE_TABLET | Freq: Every day | ORAL | Status: DC
  Administered 2015-01-06 – 2015-01-08 (×3): 81 mg via ORAL
  Filled 2015-01-06 (×3): qty 1

## 2015-01-06 MED ORDER — ENSURE PUDDING PO PUDG
1.0000 | Freq: Three times a day (TID) | ORAL | Status: DC
  Administered 2015-01-07: 1 via ORAL

## 2015-01-06 MED ORDER — ACETAMINOPHEN 650 MG RE SUPP: 650.0000 mg | Freq: Four times a day (QID) | RECTAL | Status: DC | PRN

## 2015-01-06 MED ORDER — ONDANSETRON HCL 4 MG/2ML IJ SOLN: 4.0000 mg | Freq: Four times a day (QID) | INTRAMUSCULAR | Status: DC | PRN

## 2015-01-06 MED ORDER — METOPROLOL TARTRATE 25 MG PO TABS
25.0000 mg | ORAL_TABLET | Freq: Two times a day (BID) | ORAL | Status: DC
  Administered 2015-01-06 – 2015-01-08 (×4): 25 mg via ORAL
  Filled 2015-01-06 (×4): qty 1

## 2015-01-06 MED ORDER — SODIUM CHLORIDE 0.9 % IV SOLN
INTRAVENOUS | Status: DC
  Administered 2015-01-06 – 2015-01-07 (×2): via INTRAVENOUS

## 2015-01-06 MED ORDER — BENZTROPINE MESYLATE 1 MG PO TABS
1.0000 mg | ORAL_TABLET | Freq: Every day | ORAL | Status: DC
  Administered 2015-01-06 – 2015-01-08 (×3): 1 mg via ORAL
  Filled 2015-01-06 (×3): qty 1

## 2015-01-06 MED ORDER — SODIUM CHLORIDE 0.9 % IJ SOLN
3.0000 mL | Freq: Two times a day (BID) | INTRAMUSCULAR | Status: DC
Start: 2015-01-06 — End: 2015-01-08
  Administered 2015-01-07 – 2015-01-08 (×2): 3 mL via INTRAVENOUS

## 2015-01-06 MED ORDER — ACETAMINOPHEN 325 MG PO TABS: 650.0000 mg | ORAL_TABLET | Freq: Four times a day (QID) | ORAL | Status: DC | PRN

## 2015-01-06 MED ORDER — METHYLPREDNISOLONE SODIUM SUCC 125 MG IJ SOLR: 60.0000 mg | INTRAMUSCULAR | Status: DC

## 2015-01-06 MED ORDER — SODIUM CHLORIDE 0.9 % IJ SOLN: 3.0000 mL | Freq: Two times a day (BID) | INTRAMUSCULAR | Status: DC

## 2015-01-06 MED ORDER — METHYLPREDNISOLONE SODIUM SUCC 125 MG IJ SOLR
60.0000 mg | INTRAMUSCULAR | Status: DC
  Administered 2015-01-06 – 2015-01-07 (×2): 60 mg via INTRAVENOUS
  Filled 2015-01-06 (×2): qty 2

## 2015-01-06 MED ORDER — FUROSEMIDE 10 MG/ML IJ SOLN
20.0000 mg | Freq: Two times a day (BID) | INTRAMUSCULAR | Status: DC
  Administered 2015-01-07 – 2015-01-08 (×3): 20 mg via INTRAVENOUS
  Filled 2015-01-06 (×3): qty 2

## 2015-01-06 MED ORDER — IPRATROPIUM-ALBUTEROL 0.5-2.5 (3) MG/3ML IN SOLN
3.0000 mL | RESPIRATORY_TRACT | Status: DC
  Administered 2015-01-06 – 2015-01-07 (×3): 3 mL via RESPIRATORY_TRACT
  Filled 2015-01-06 (×3): qty 3

## 2015-01-06 MED ORDER — CITALOPRAM HYDROBROMIDE 20 MG PO TABS
20.0000 mg | ORAL_TABLET | Freq: Every morning | ORAL | Status: DC
  Administered 2015-01-07 – 2015-01-08 (×2): 20 mg via ORAL
  Filled 2015-01-06 (×2): qty 1

## 2015-01-06 MED ORDER — ZOLPIDEM TARTRATE 5 MG PO TABS: 10.0000 mg | ORAL_TABLET | Freq: Every evening | ORAL | Status: DC | PRN

## 2015-01-06 MED ORDER — SODIUM CHLORIDE 0.9 % IJ SOLN: 3.0000 mL | INTRAMUSCULAR | Status: DC | PRN

## 2015-01-06 MED ORDER — RIVASTIGMINE 9.5 MG/24HR TD PT24
9.5000 mg | MEDICATED_PATCH | Freq: Every day | TRANSDERMAL | Status: DC
  Administered 2015-01-07 – 2015-01-08 (×2): 9.5 mg via TRANSDERMAL
  Filled 2015-01-06 (×5): qty 1

## 2015-01-06 MED ORDER — SODIUM CHLORIDE 0.9 % IV SOLN: 250.0000 mL | INTRAVENOUS | Status: DC | PRN

## 2015-01-06 MED ORDER — SODIUM CHLORIDE 0.9 % IV SOLN: INTRAVENOUS | Status: DC

## 2015-01-06 MED ORDER — HEPARIN SODIUM (PORCINE) 5000 UNIT/ML IJ SOLN
5000.0000 [IU] | Freq: Three times a day (TID) | INTRAMUSCULAR | Status: DC
  Administered 2015-01-06 – 2015-01-08 (×5): 5000 [IU] via SUBCUTANEOUS
  Filled 2015-01-06 (×4): qty 1

## 2015-01-06 NOTE — ED Notes (Signed)
Patient family states that patient was c/o of sore throat and headache today. No difficulty swallowing eating without problem. No resp distress noted. Family at bedside Patient has dementia per family

## 2015-01-06 NOTE — ED Provider Notes (Signed)
CSN: [id number]     Arrival date & time 01/06/15  1526 History   First MD Initiated Contact with Patient 01/06/15 1540     Chief Complaint  Patient presents with  . Shortness of Breath      Patient is a 81 y.o. female presenting with shortness of breath. The history is provided by a caregiver and a relative. The history is limited by the condition of the patient (Hx dementia).  Shortness of Breath Pt was seen at 1545. Per pt's family: States pt has c/o sore throat, "chills," SOB, and been "congested" since yesterday. States they hear pt "wheezing." Denies objective fevers, no rash, no N/V/D. Pt has significant hx of dementia.   Past Medical History  Diagnosis Date  . Hypertension   . Heart attack   . Parkinson disease   . Cancer     breast  . Alzheimer's dementia    Past Surgical History  Procedure Laterality Date  . Breast surgery    . Abdominal hysterectomy    . Mastectomy    . Small intestine removed    . Tonsillectomy    . Cholecystectomy    . Colon surgery    . Pelvic fracture surgery    . Leg surgery    . Arm surgery    . Hip pinning,cannulated Right 02/01/2013    Procedure: CANNULATED HIP PINNING;  Surgeon: Sanjuana Kava, MD;  Location: [facility];  Service: Orthopedics;  Laterality: Right;   Family History  Problem Relation Age of Onset  . Aneurysm Mother   . Diabetes Father    History  Substance Use Topics  . Smoking status: Never Smoker   . Smokeless tobacco: Never Used  . Alcohol Use: No    Review of Systems  Unable to perform ROS: Dementia  Respiratory: Positive for shortness of breath.       Allergies  Penicillins  Home Medications   Prior to Admission medications   Medication Sig Start Date End Date Taking? Authorizing Provider  acetaminophen (TYLENOL) 500 MG tablet Take 500 mg by mouth at bedtime. For pain    Historical Provider, MD  ALPRAZolam Duanne Moron) 0.5 MG tablet Take 0.5 mg by mouth daily.    Historical Provider, MD  aspirin EC 81 MG  tablet Take 1 tablet (81 mg total) by mouth daily. 04/05/13   Kathie Dike, MD  benztropine (COGENTIN) 1 MG tablet Take 1 tablet (1 mg total) by mouth daily. 04/05/13   Kathie Dike, MD  bumetanide (BUMEX) 0.5 MG tablet Take 1 tablet (0.5 mg total) by mouth daily as needed (swelling). 04/05/13   Kathie Dike, MD  carbamide peroxide (DEBROX) 6.5 % otic solution Place 5 drops into the right ear 2 (two) times daily. 10/02/13   Orpah Greek, MD  citalopram (CELEXA) 20 MG tablet Take 1 tablet (20 mg total) by mouth every morning. 04/05/13   Kathie Dike, MD  clotrimazole (MYCELEX) 10 MG troche Take 10 mg by mouth 5 (five) times daily. Starting 09/30/13 x 5 days.    Historical Provider, MD  haloperidol (HALDOL) 2 MG/ML solution Take 1 mg by mouth 2 (two) times daily as needed for agitation.    Historical Provider, MD  lactulose (CHRONULAC) 10 GM/15ML solution Take 15 mLs (10 g total) by mouth 2 (two) times daily as needed for mild constipation, moderate constipation or severe constipation. 10/02/13   Orpah Greek, MD  levofloxacin (LEVAQUIN) 750 MG tablet Take 1 tablet (750 mg total) by mouth daily. 09/24/13  Kathie Dike, MD  metoprolol (LOPRESSOR) 50 MG tablet Take 0.5 tablets (25 mg total) by mouth 2 (two) times daily. 09/24/13   Kathie Dike, MD  polyethylene glycol (MIRALAX / GLYCOLAX) packet Take 17 g by mouth daily. 09/26/13   Kathie Dike, MD  rivastigmine (EXELON) 9.5 mg/24hr Place 1 patch (9.5 mg total) onto the skin daily. *Remove and discard used patches* 04/05/13   Kathie Dike, MD  vancomycin (VANCOCIN) 50 mg/mL oral solution Take 2.5 mLs (125 mg total) by mouth every 6 (six) hours. For 14 days 09/24/13   Kathie Dike, MD  zolpidem (AMBIEN) 10 MG tablet Take 10 mg by mouth at bedtime as needed for sleep.    Historical Provider, MD   BP 92/62 mmHg  Pulse 73  Temp(Src) 98 F (36.7 C) (Oral)  Resp 18  Ht 5' 4.5" (1.638 m)  Wt 160 lb (72.576 kg)  BMI 27.05 kg/m2  SpO2  98%  Filed Vitals:   01/06/15 1532 01/06/15 1924 01/06/15 2002  BP: 92/62 151/89   Pulse: 73 70   Temp: 98 F (36.7 C)  97.9 F (36.6 C)  TempSrc: Oral  Rectal  Resp: 18 21   Height: 5' 4.5" (1.638 m)    Weight: 160 lb (72.576 kg)    SpO2: 98% 97%     Physical Exam 1550: Physical examination:  Nursing notes reviewed; Vital signs and O2 SAT reviewed;  Constitutional: Well developed, Well nourished, In no acute distress; Head:  Normocephalic, atraumatic; Eyes: EOMI, PERRL, No scleral icterus; ENMT: TM's clear bilat. +edemetous nasal turbinates bilat with clear rhinorrhea. Mouth and pharynx normal, Mucous membranes dry; Neck: Supple, Full range of motion, No lymphadenopathy; Cardiovascular: Regular rate and rhythm, No gallop; Respiratory: Breath sounds clear & equal bilaterally, No wheezes.  Speaking full sentences with ease, Normal respiratory effort/excursion; Chest: Nontender, Movement normal; Abdomen: Soft, Nontender, Nondistended, Normal bowel sounds; Genitourinary: No CVA tenderness; Extremities: Pulses normal, No tenderness, No edema, No calf edema or asymmetry.; Neuro: Awake, alert, confused per hx dementia. No facial droop. Speech clear. Moves all extremities spontaneously without apparent gross focal motor deficits.; Skin: Color normal, Warm, Dry.   ED Course  Procedures     EKG Interpretation None      MDM  MDM Reviewed: previous chart, nursing note and vitals Reviewed previous: labs and ECG Interpretation: labs, ECG and x-ray     Results for orders placed or performed during the hospital encounter of 01/06/15  Rapid strep screen  Result Value Ref Range   Streptococcus, Group A Screen (Direct) NEGATIVE NEGATIVE  Urinalysis, Routine w reflex microscopic  Result Value Ref Range   Color, Urine YELLOW YELLOW   APPearance CLEAR CLEAR   Specific Gravity, Urine 1.025 1.005 - 1.030   pH 5.5 5.0 - 8.0   Glucose, UA NEGATIVE NEGATIVE mg/dL   Hgb urine dipstick SMALL (A)  NEGATIVE   Bilirubin Urine NEGATIVE NEGATIVE   Ketones, ur NEGATIVE NEGATIVE mg/dL   Protein, ur NEGATIVE NEGATIVE mg/dL   Urobilinogen, UA 0.2 0.0 - 1.0 mg/dL   Nitrite NEGATIVE NEGATIVE   Leukocytes, UA NEGATIVE NEGATIVE  Basic metabolic panel  Result Value Ref Range   Sodium 143 135 - 145 mmol/L   Potassium 3.9 3.5 - 5.1 mmol/L   Chloride 109 96 - 112 mmol/L   CO2 29 19 - 32 mmol/L   Glucose, Bld 87 70 - 99 mg/dL   BUN 21 6 - 23 mg/dL   Creatinine, Ser 0.95 0.50 - 1.10 mg/dL  Calcium 8.8 8.4 - 10.5 mg/dL   GFR calc non Af Amer 50 (L) >90 mL/min   GFR calc Af Amer 58 (L) >90 mL/min   Anion gap 5 5 - 15  Troponin I  Result Value Ref Range   Troponin I <0.03 <0.031 ng/mL  CBC with Differential  Result Value Ref Range   WBC 8.1 4.0 - 10.5 K/uL   RBC 4.60 3.87 - 5.11 MIL/uL   Hemoglobin 10.6 (L) 12.0 - 15.0 g/dL   HCT 35.2 (L) 36.0 - 46.0 %   MCV 76.5 (L) 78.0 - 100.0 fL   MCH 23.0 (L) 26.0 - 34.0 pg   MCHC 30.1 30.0 - 36.0 g/dL   RDW 17.2 (H) 11.5 - 15.5 %   Platelets 281 150 - 400 K/uL   Neutrophils Relative % 66 43 - 77 %   Neutro Abs 5.3 1.7 - 7.7 K/uL   Lymphocytes Relative 24 12 - 46 %   Lymphs Abs 1.9 0.7 - 4.0 K/uL   Monocytes Relative 8 3 - 12 %   Monocytes Absolute 0.7 0.1 - 1.0 K/uL   Eosinophils Relative 2 0 - 5 %   Eosinophils Absolute 0.2 0.0 - 0.7 K/uL   Basophils Relative 0 0 - 1 %   Basophils Absolute 0.0 0.0 - 0.1 K/uL  Brain natriuretic peptide  Result Value Ref Range   B Natriuretic Peptide 691.0 (H) 0.0 - 100.0 pg/mL  Lactic acid, plasma  Result Value Ref Range   Lactic Acid, Venous 1.3 0.5 - 2.0 mmol/L  Urine microscopic-add on  Result Value Ref Range   Squamous Epithelial / LPF FEW (A) RARE   RBC / HPF 0-2 <3 RBC/hpf   Bacteria, UA RARE RARE   Dg Chest Port 1 View 01/06/2015   CLINICAL DATA:  Shortness of breath.  Chest congestion.  Dementia.  EXAM: PORTABLE CHEST - 1 VIEW  COMPARISON:  09/24/2013 and 10/02/2013 heart size and pulmonary  vascularity are normal. There is tortuosity and calcification of the thoracic aorta. Minimal scarring in the right midzone and right base. No acute infiltrates or effusions. No acute osseous abnormality.  FINDINGS: No acute disease.  IMPRESSION: No active disease.   Electronically Signed   By: Lorriane Shire M.D.   On: 01/06/2015 16:22    2000:  Pt orthostatic; judicious IVF given. BNP elevated, no old to compare; no overt CHF on CXR. Will admit.  T/C to [facility] Dr. Marily Memos, case discussed, including:  HPI, pertinent PM/SHx, VS/PE, dx testing, ED course and treatment:  Agreeable to admit, requests to write temporary orders, obtain tele bed to team [facility].   Francine Graven, DO 01/07/15 2230

## 2015-01-06 NOTE — H&P (Addendum)
[facility] History and Physical  [name] ZOX:[id number] DOB: 1920-05-23 DOA: 01/06/2015  Referring physician: Dr. Thurnell Garbe - [facility] PCP: [name], MD   Chief Complaint: SOB  HPI: [name] is a 81 y.o. female  Level 5 Caveat: pt demented and unable to give reliable history.  Pt lives at home w/ daughter.  Pt began complaining of throat pain, HA, and back pain. Started today. To the family she appeared to be wheezing and stuggling to breath. To make her symptoms better on Sunday giving her her normal daily medications. Family denies any complaint of chest pain, fever, recent dysuria, abdominal pain, diarrhea, constipation. Patient has to eat with assistance and there is no reported aspiration. Sleeps lying down. No home O2. nml state of health prior to today.  Nothing was tried to make her symptoms better.  When asked about home medications, family was unfamiliar with her diuretic but states that she takes her fluid pill every day although it is prescribed only as needed.  Review of Systems:  Patient demented and unable to participate in history. History obtained from family members and listed in history of present illness.   Past Medical History  Diagnosis Date  . Hypertension   . Heart attack   . Parkinson disease   . Cancer     breast  . Alzheimer's dementia    Past Surgical History  Procedure Laterality Date  . Breast surgery    . Abdominal hysterectomy    . Mastectomy    . Small intestine removed    . Tonsillectomy    . Cholecystectomy    . Colon surgery    . Pelvic fracture surgery    . Leg surgery    . Arm surgery    . Hip pinning,cannulated Right 02/01/2013    Procedure: CANNULATED HIP PINNING;  Surgeon: Sanjuana Kava, MD;  Location: [facility];  Service: Orthopedics;  Laterality: Right;   Social History:  reports that she has never smoked. She has never used smokeless tobacco. She reports that she does not drink alcohol or use illicit  drugs.  Allergies  Allergen Reactions  . Penicillins Swelling and Rash    Family History  Problem Relation Age of Onset  . Aneurysm Mother   . Diabetes Father      Prior to Admission medications   Medication Sig Start Date End Date Taking? Authorizing Provider  acetaminophen (TYLENOL) 500 MG tablet Take 500 mg by mouth at bedtime. For pain    Historical Provider, MD  ALPRAZolam Duanne Moron) 0.5 MG tablet Take 0.5 mg by mouth daily.    Historical Provider, MD  aspirin EC 81 MG tablet Take 1 tablet (81 mg total) by mouth daily. 04/05/13   Kathie Dike, MD  benztropine (COGENTIN) 1 MG tablet Take 1 tablet (1 mg total) by mouth daily. 04/05/13   Kathie Dike, MD  bumetanide (BUMEX) 0.5 MG tablet Take 1 tablet (0.5 mg total) by mouth daily as needed (swelling). 04/05/13   Kathie Dike, MD  carbamide peroxide (DEBROX) 6.5 % otic solution Place 5 drops into the right ear 2 (two) times daily. 10/02/13   Orpah Greek, MD  citalopram (CELEXA) 20 MG tablet Take 1 tablet (20 mg total) by mouth every morning. 04/05/13   Kathie Dike, MD  clotrimazole (MYCELEX) 10 MG troche Take 10 mg by mouth 5 (five) times daily. Starting 09/30/13 x 5 days.    Historical Provider, MD  haloperidol (HALDOL) 2 MG/ML solution Take 1 mg by  mouth 2 (two) times daily as needed for agitation.    Historical Provider, MD  lactulose (CHRONULAC) 10 GM/15ML solution Take 15 mLs (10 g total) by mouth 2 (two) times daily as needed for mild constipation, moderate constipation or severe constipation. 10/02/13   Orpah Greek, MD  levofloxacin (LEVAQUIN) 750 MG tablet Take 1 tablet (750 mg total) by mouth daily. 09/24/13   Kathie Dike, MD  metoprolol (LOPRESSOR) 50 MG tablet Take 0.5 tablets (25 mg total) by mouth 2 (two) times daily. 09/24/13   Kathie Dike, MD  polyethylene glycol (MIRALAX / GLYCOLAX) packet Take 17 g by mouth daily. 09/26/13   Kathie Dike, MD  rivastigmine (EXELON) 9.5 mg/24hr Place 1 patch (9.5 mg  total) onto the skin daily. *Remove and discard used patches* 04/05/13   Kathie Dike, MD  vancomycin (VANCOCIN) 50 mg/mL oral solution Take 2.5 mLs (125 mg total) by mouth every 6 (six) hours. For 14 days 09/24/13   Kathie Dike, MD  zolpidem (AMBIEN) 10 MG tablet Take 10 mg by mouth at bedtime as needed for sleep.    Historical Provider, MD   Physical Exam: Filed Vitals:   01/06/15 1924 01/06/15 1930 01/06/15 2000 01/06/15 2002  BP: 151/89 133/81 152/98   Pulse: 70 63 72   Temp:    97.9 F (36.6 C)  TempSrc:    Rectal  Resp: 21 20 20    Height:      Weight:      SpO2: 97% 96% 98%     Wt Readings from Last 3 Encounters:  01/06/15 72.576 kg (160 lb)  09/17/13 55.1 kg (121 lb 7.6 oz)  04/04/13 53.298 kg (117 lb 8 oz)    General:  Appears to be intermittently calm to minimally agitated.  Eyes: PERRL, normal lids, irises & conjunctiva ENT: Dry mucous membranes Neck:  no LAD, masses or thyromegaly Cardiovascular: Faint heart sounds, RRR, no appreciable murmur rub or gallop. No LE edema. Telemetry:  SR, no arrhythmias  Respiratory: Diffuse intermittent wheezing with crackles and diminished breath sounds in the bases bilaterally, normal respiratory effort, on room air Abdomen:  soft, ntnd Skin:  no rash or induration seen on limited exam Musculoskeletal:  grossly normal tone BUE/BLE Psychiatric: Patient intermittently follows basic commands but unable to give any reliable answers to questions. Neurologic: Cranial nerves II through XII grossly intact, moves all extremities equal needed fashion. grossly non-focal.          Labs on Admission:  Basic Metabolic Panel:  Recent Labs Lab 01/06/15 1658  NA 143  K 3.9  CL 109  CO2 29  GLUCOSE 87  BUN 21  CREATININE 0.95  CALCIUM 8.8   Liver Function Tests: No results for input(s): AST, ALT, ALKPHOS, BILITOT, PROT, ALBUMIN in the last 168 hours. No results for input(s): LIPASE, AMYLASE in the last 168 hours. No results for  input(s): AMMONIA in the last 168 hours. CBC:  Recent Labs Lab 01/06/15 1658  WBC 8.1  NEUTROABS 5.3  HGB 10.6*  HCT 35.2*  MCV 76.5*  PLT 281   Cardiac Enzymes:  Recent Labs Lab 01/06/15 1658  TROPONINI <0.03    BNP (last 3 results)  Recent Labs  01/06/15 1658  BNP 691.0*    ProBNP (last 3 results) No results for input(s): PROBNP in the last 8760 hours.  CBG: No results for input(s): GLUCAP in the last 168 hours.  Radiological Exams on Admission: Dg Chest Port 1 View  01/06/2015   CLINICAL DATA:  Shortness of breath.  Chest congestion.  Dementia.  EXAM: PORTABLE CHEST - 1 VIEW  COMPARISON:  09/24/2013 and 10/02/2013 heart size and pulmonary vascularity are normal. There is tortuosity and calcification of the thoracic aorta. Minimal scarring in the right midzone and right base. No acute infiltrates or effusions. No acute osseous abnormality.  FINDINGS: No acute disease.  IMPRESSION: No active disease.   Electronically Signed   By: Lorriane Shire M.D.   On: 01/06/2015 16:22    EKG: Independently reviewed. NSR, PACs, nonspecific T-wave changes compared to previous in the lateral leads   Assessment/Plan Principal Problem:   Dyspnea Active Problems:   Dementia with behavioral disturbance   Parkinson disease   Microcytic anemia   Essential hypertension   Depression with anxiety   Insomnia   Acute on chronic diastolic congestive heart failure  Shortness of breath: Likely multifactorial including mild CHF exacerbation, and likely viral respiratory infection causing bronchospasm. BNP 691, lactic acid 1.1, WBC 8.1, on room air without increased work of breathing, rapid strep test negative. No history of asthma or COPD. May have early pneumonia but difficult to tell at this time. - Telemetry - CHF workup and diuresis as below -  Duonebs Q4hrs - Solu-Medrol 60 mg daily - O2 when necessary - Repeat chest x-ray in the a.m. - Viral respiratory panel - f/u Strep  Cx  Acute on chronic diastolic congestive heart failure: Last echo from 09/20/2013 showing EF 70-75% and grade 1 diastolic dysfunction. Patient's home diuretic is written as when necessary the family states that they give her a fluid pill daily but are unsure what medicine it is. Home medication is Bumex 0.5 mg daily when necessary. He EKG with nonspecific ST changes in lateral leads, also may point that some of the patient's symptoms are from CHF exacerbation. Troponin negative. - Lasix IV 20 twice a day - Strict I's and O's - Daily weights - Continue beta blocker - Repeat echo  Hypertension: Normotensive - Continue metoprolol  Anemia: Hgb 10.6, at baseline. Microcytic. - Anemia panel  Depression/anxiety: - Continue Xanax, Celexa  Dementia: Advanced - Continue Haldol when necessary -   Parkinson's: At baseline - Continue Exelon patch, Cogentin  Insomnia: - Continue Ambien   Code Status: DNI DVT Prophylaxis: Hep Family Communication: Daughter and sister Disposition Plan: pending improvement    Kash Mothershead J, MD Family Medicine [facility] [email or web address] Password [email or web address]

## 2015-01-06 NOTE — ED Notes (Signed)
Sister reports that pt has appeared to be sob since yesterday, c/o sore throat, and fever.

## 2015-01-07 ENCOUNTER — Encounter (HOSPITAL_COMMUNITY): Payer: Self-pay | Admitting: *Deleted

## 2015-01-07 ENCOUNTER — Observation Stay (HOSPITAL_COMMUNITY): Payer: Medicare Other

## 2015-01-07 DIAGNOSIS — I5033 Acute on chronic diastolic (congestive) heart failure: Secondary | ICD-10-CM | POA: Diagnosis not present

## 2015-01-07 DIAGNOSIS — F418 Other specified anxiety disorders: Secondary | ICD-10-CM | POA: Diagnosis not present

## 2015-01-07 DIAGNOSIS — G309 Alzheimer's disease, unspecified: Secondary | ICD-10-CM | POA: Diagnosis present

## 2015-01-07 DIAGNOSIS — G20 Parkinson's disease: Secondary | ICD-10-CM | POA: Diagnosis present

## 2015-01-07 DIAGNOSIS — R0600 Dyspnea, unspecified: Secondary | ICD-10-CM | POA: Diagnosis not present

## 2015-01-07 DIAGNOSIS — F0280 Dementia in other diseases classified elsewhere without behavioral disturbance: Secondary | ICD-10-CM | POA: Diagnosis present

## 2015-01-07 DIAGNOSIS — G4700 Insomnia, unspecified: Secondary | ICD-10-CM | POA: Diagnosis present

## 2015-01-07 DIAGNOSIS — D509 Iron deficiency anemia, unspecified: Secondary | ICD-10-CM | POA: Diagnosis present

## 2015-01-07 DIAGNOSIS — I10 Essential (primary) hypertension: Secondary | ICD-10-CM | POA: Diagnosis present

## 2015-01-07 DIAGNOSIS — R0602 Shortness of breath: Secondary | ICD-10-CM | POA: Diagnosis not present

## 2015-01-07 DIAGNOSIS — Z90710 Acquired absence of both cervix and uterus: Secondary | ICD-10-CM | POA: Diagnosis not present

## 2015-01-07 DIAGNOSIS — I252 Old myocardial infarction: Secondary | ICD-10-CM | POA: Diagnosis not present

## 2015-01-07 DIAGNOSIS — Z880 Allergy status to penicillin: Secondary | ICD-10-CM | POA: Diagnosis not present

## 2015-01-07 DIAGNOSIS — J988 Other specified respiratory disorders: Secondary | ICD-10-CM | POA: Diagnosis present

## 2015-01-07 DIAGNOSIS — F0391 Unspecified dementia with behavioral disturbance: Secondary | ICD-10-CM | POA: Diagnosis not present

## 2015-01-07 DIAGNOSIS — Z9049 Acquired absence of other specified parts of digestive tract: Secondary | ICD-10-CM | POA: Diagnosis present

## 2015-01-07 DIAGNOSIS — Z853 Personal history of malignant neoplasm of breast: Secondary | ICD-10-CM | POA: Diagnosis not present

## 2015-01-07 LAB — CBC
HCT: 35.1 % — ABNORMAL LOW (ref 36.0–46.0)
Hemoglobin: 10.7 g/dL — ABNORMAL LOW (ref 12.0–15.0)
MCH: 22.9 pg — ABNORMAL LOW (ref 26.0–34.0)
MCHC: 30.5 g/dL (ref 30.0–36.0)
MCV: 75 fL — ABNORMAL LOW (ref 78.0–100.0)
Platelets: 270 10*3/uL (ref 150–400)
RBC: 4.68 MIL/uL (ref 3.87–5.11)
RDW: 16.9 % — ABNORMAL HIGH (ref 11.5–15.5)
WBC: 9.3 10*3/uL (ref 4.0–10.5)

## 2015-01-07 LAB — FERRITIN: Ferritin: 40 ng/mL (ref 10–291)

## 2015-01-07 LAB — COMPREHENSIVE METABOLIC PANEL
ALT: 11 U/L (ref 0–35)
AST: 19 U/L (ref 0–37)
Albumin: 3.1 g/dL — ABNORMAL LOW (ref 3.5–5.2)
Alkaline Phosphatase: 60 U/L (ref 39–117)
Anion gap: 7 (ref 5–15)
BUN: 17 mg/dL (ref 6–23)
CO2: 24 mmol/L (ref 19–32)
Calcium: 8.4 mg/dL (ref 8.4–10.5)
Chloride: 110 mmol/L (ref 96–112)
Creatinine, Ser: 0.73 mg/dL (ref 0.50–1.10)
GFR calc Af Amer: 83 mL/min — ABNORMAL LOW (ref >90)
GFR calc non Af Amer: 71 mL/min — ABNORMAL LOW (ref >90)
Glucose, Bld: 147 mg/dL — ABNORMAL HIGH (ref 70–99)
Potassium: 4.1 mmol/L (ref 3.5–5.1)
Sodium: 141 mmol/L (ref 135–145)
Total Bilirubin: 0.6 mg/dL (ref 0.3–1.2)
Total Protein: 7.2 g/dL (ref 6.0–8.3)

## 2015-01-07 LAB — RETICULOCYTES
RBC.: 4.65 MIL/uL (ref 3.87–5.11)
Retic Count, Absolute: 88.4 10*3/uL (ref 19.0–186.0)
Retic Ct Pct: 1.9 % (ref 0.4–3.1)

## 2015-01-07 LAB — FOLATE: Folate: 18.3 ng/mL

## 2015-01-07 LAB — IRON AND TIBC
Iron: 60 ug/dL (ref 42–145)
Saturation Ratios: 21 % (ref 20–55)
TIBC: 283 ug/dL (ref 250–470)
UIBC: 223 ug/dL (ref 125–400)

## 2015-01-07 LAB — VITAMIN B12: Vitamin B-12: 337 pg/mL (ref 211–911)

## 2015-01-07 LAB — MAGNESIUM: Magnesium: 2 mg/dL (ref 1.5–2.5)

## 2015-01-07 MED ORDER — HALOPERIDOL 2 MG PO TABS
1.0000 mg | ORAL_TABLET | Freq: Two times a day (BID) | ORAL | Status: DC | PRN
  Administered 2015-01-07: 1 mg via ORAL
  Filled 2015-01-07 (×2): qty 1

## 2015-01-07 MED ORDER — IPRATROPIUM-ALBUTEROL 0.5-2.5 (3) MG/3ML IN SOLN
3.0000 mL | Freq: Four times a day (QID) | RESPIRATORY_TRACT | Status: DC
  Administered 2015-01-07 (×4): 3 mL via RESPIRATORY_TRACT
  Filled 2015-01-07 (×4): qty 3

## 2015-01-07 MED ORDER — IPRATROPIUM-ALBUTEROL 0.5-2.5 (3) MG/3ML IN SOLN
3.0000 mL | Freq: Three times a day (TID) | RESPIRATORY_TRACT | Status: DC
  Administered 2015-01-08: 3 mL via RESPIRATORY_TRACT
  Filled 2015-01-07: qty 3

## 2015-01-07 NOTE — Care Management Note (Addendum)
    Page 1 of 1   01/08/2015     9:31:17 AM CARE MANAGEMENT NOTE 01/08/2015  Patient:  [name], [name]   Account Number:  [id number]  Date Initiated:  01/07/2015  Documentation initiated by:  Jolene Provost  Subjective/Objective Assessment:   Pt is from home, lives with daughter. Pt severly demented. Pt requires 24/7 care from daughter. Pt has walker but cant use it. Pt has wheelchair for PRN use. Discussed needs with pt's daughter. Pt in proces of getting CAP aid. Wi     Action/Plan:   Will be accepted soon, per pt's daughter. Will arrange for [facility] RN and aid at discharge. Pt has used [facility] in the past and is agreeable to using them again. Romualdo Bolk of [facility] notified and will obtain info from pt chart.   Anticipated DC Date:  01/08/2015   Anticipated DC Plan:  [place]  CM consult      Choice offered to / List presented to:             Status of service:  Completed, signed off Medicare Important Message given?   (If response is "NO", the following Medicare IM given date fields will be blank) Date Medicare IM given:   Medicare IM given by:   Date Additional Medicare IM given:   Additional Medicare IM given by:    Discharge Disposition:  [place]  Per UR Regulation:    If discussed at Long Length of Stay Meetings, dates discussed:    Comments:  01/08/2015 0920 Jolene Provost, RN, MSN, CM MD anticiaptes discharge home today. [facility] aware of discharge plan. No further CM needs.  01/07/2015 [place], RN, MSN, CM

## 2015-01-07 NOTE — Progress Notes (Signed)
PT Cancellation Note  Patient Details Name: [name] MRN: [id number] DOB: 08-02-1920   Cancelled Treatment:    Reason Eval/Treat Not Completed: PT screened, no needs identified, will sign off.  Pt has advanced dementia and is unable to interact with therapist.  Daughter states that she is managing as well as could be expected and expressed no needs at this time.  Mobility for pt is provided primarily by w/c.   Demetrios Isaacs L 01/07/2015, 1:02 PM

## 2015-01-07 NOTE — Progress Notes (Signed)
  Echocardiogram 2D Echocardiogram has been performed.  [name] 01/07/2015, 9:45 AM

## 2015-01-07 NOTE — Progress Notes (Signed)
[facility]                                                                              Patient Demographics  [name], is a 81 y.o. female, DOB - [birth year], AST:[id number]  Admit date - 01/06/2015   Admitting Physician Waldemar Dickens, MD  Outpatient Primary MD for the patient is [name], MD  LOS -    Chief Complaint  Patient presents with  . Shortness of Breath      HPI on 01/06/2015 by Dr. Linna Darner [name] is a 81 y.o. Female. Level 5 Caveat: pt demented and unable to give reliable history. Pt lives at home w/ daughter. Pt began complaining of throat pain, HA, and back pain. Started today. To the family she appeared to be wheezing and stuggling to breath. To make her symptoms better on Sunday giving her her normal daily medications. Family denies any complaint of chest pain, fever, recent dysuria, abdominal pain, diarrhea, constipation. Patient has to eat with assistance and there is no reported aspiration. Sleeps lying down. No home O2. nml state of health prior to today.  Nothing was tried to make her symptoms better. When asked about home medications, family was unfamiliar with her diuretic but states that she takes her fluid pill every day although it is prescribed only as needed.  Assessment & Plan   Acute Diastolic CHF  -Last echocardiogram 09/30/2013 shows EF of 9892%, grade 1 diastolic dysfunction -Echocardiogram: EF 11-94%, grade 2 diastolic dysfunction -Patient does use diuretic, Bumex, as needed -BNP 691 upon admission -Per daughter, at bedside, patient's breathing has improved -Continue to monitor daily weights, intake and output, diuresis  Dyspnea -Possibly secondary to CHF versus viral respiratory infection -Patient does not have history of asthma or COPD -Repeat chest x-ray: Central vascular congestion, chronic changes with superimposed interstitial edema or atypical/viral infection. Mild lung base opacities atelectasis versus  pneumonia. -Dyspnea improving -Respiratory viral panel pending, strep culture pending -Continue nebulizer treatments, Solu-Medrol, supplemental oxygen to maintain saturations above 92% -Patient currently afebrile with no leukocytosis -Will attempt to obtain a PA and lateral views  Essential hypertension -Currently stable, continue metoprolol  Anxiety/depression -Continue Xanax, Celexa  Dementia/Parkinson disease -Continue Haldol PRN, Exelon patch, Cogentin  Chronic microcytic anemia -Hemoglobin appears to be at baseline, continue to monitor CBC  Insomnia -Continue ambien  Code Status: Parital  Family Communication: Daughter at bedside  Disposition Plan: Admitted.  Continue diuresis  Time Spent in minutes   30 minutes  Procedures  Echocardiogram  Consults   None  DVT Prophylaxis  Heparin  Lab Results  Component Value Date   PLT 270 01/07/2015    Medications  Scheduled Meds: . sodium chloride   Intravenous STAT  . ALPRAZolam  0.5 mg Oral Daily  . aspirin EC  81 mg Oral Daily  . benztropine  1 mg Oral Daily  . citalopram  20 mg Oral q morning - 10a  . feeding supplement (ENSURE)  1 Container Oral TID BM  . furosemide  20 mg Intravenous BID  . heparin  5,000 Units Subcutaneous 3 times per day  . ipratropium-albuterol  3 mL Nebulization QID  . methylPREDNISolone (  SOLU-MEDROL) injection  60 mg Intravenous Q24H  . metoprolol  25 mg Oral BID  . rivastigmine  9.5 mg Transdermal Daily  . sodium chloride  3 mL Intravenous Q12H  . sodium chloride  3 mL Intravenous Q12H   Continuous Infusions: . sodium chloride 75 mL/hr at 01/06/15 1730   PRN Meds:.sodium chloride, acetaminophen **OR** acetaminophen, haloperidol, lactulose, ondansetron **OR** ondansetron (ZOFRAN) IV, sodium chloride, zolpidem  Antibiotics    Anti-infectives    None      Subjective:   [name] seen and examined today. Patient has advanced dementia.  Does not respond to questions or  follow commands.  Per daughter at bedside, patient's breathing has improved since being admitted.    Objective:   Filed Vitals:   01/07/15 0029 01/07/15 0338 01/07/15 0615 01/07/15 0844  BP:   130/58   Pulse:   73   Temp:   97.8 F (36.6 C)   TempSrc:   Axillary   Resp:   20   Height:      Weight:   65.046 kg (143 lb 6.4 oz)   SpO2: 95% 97%  94%    Wt Readings from Last 3 Encounters:  01/07/15 65.046 kg (143 lb 6.4 oz)  09/17/13 55.1 kg (121 lb 7.6 oz)  04/04/13 53.298 kg (117 lb 8 oz)     Intake/Output Summary (Last 24 hours) at 01/07/15 1147 Last data filed at 01/07/15 0830  Gross per 24 hour  Intake      0 ml  Output    400 ml  Net   -400 ml    Exam  General: Well developed, well nourished  HEENT: NCAT, mucous membranes moist.   Cardiovascular: S1 S2 auscultated, RRR  Respiratory: Normal respiratory effort, no wheezing or rales noted  Abdomen: Soft, nontender, nondistended, + bowel sounds  Extremities: warm dry without cyanosis clubbing or edema  Neuro: Unable to assess due to dementia, moves all extremities sporadically   Data Review   Micro Results Recent Results (from the past 240 hour(s))  Rapid strep screen     Status: None   Collection Time: 01/06/15  6:04 PM  Result Value Ref Range Status   Streptococcus, Group A Screen (Direct) NEGATIVE NEGATIVE Final    Comment: (NOTE) A Rapid Antigen test may result negative if the antigen level in the sample is below the detection level of this test. The FDA has not cleared this test as a stand-alone test therefore the rapid antigen negative result has reflexed to a Group A Strep culture.     Radiology Reports Dg Chest Port 1 View  01/07/2015   CLINICAL DATA:  Shortness of breath  EXAM: PORTABLE CHEST - 1 VIEW  COMPARISON:  01/06/2015  FINDINGS: Prominent cardiomediastinal contours. Aortic atherosclerotic calcifications. Central vascular congestion. Interstitial prominence. Mild lung base/retrocardiac  opacity. Small effusions not excluded. No pneumothorax. Osteopenia. Multilevel degenerative change. Mandible obscures the apices.  IMPRESSION: Central vascular congestion. Interstitial prominence may reflect chronic changes with superimposed interstitial edema or atypical/ viral infection.  Mild lung base opacities; atelectasis versus pneumonia.  Consider PA and lateral views when the patient can tolerate for better characterization.   Electronically Signed   By: Carlos Levering M.D.   On: 01/07/2015 06:38   Dg Chest Port 1 View  01/06/2015   CLINICAL DATA:  Shortness of breath.  Chest congestion.  Dementia.  EXAM: PORTABLE CHEST - 1 VIEW  COMPARISON:  09/24/2013 and 10/02/2013 heart size and pulmonary vascularity are normal. There  is tortuosity and calcification of the thoracic aorta. Minimal scarring in the right midzone and right base. No acute infiltrates or effusions. No acute osseous abnormality.  FINDINGS: No acute disease.  IMPRESSION: No active disease.   Electronically Signed   By: Lorriane Shire M.D.   On: 01/06/2015 16:22    CBC  Recent Labs Lab 01/06/15 1658 01/07/15 0708  WBC 8.1 9.3  HGB 10.6* 10.7*  HCT 35.2* 35.1*  PLT 281 270  MCV 76.5* 75.0*  MCH 23.0* 22.9*  MCHC 30.1 30.5  RDW 17.2* 16.9*  LYMPHSABS 1.9  --   MONOABS 0.7  --   EOSABS 0.2  --   BASOSABS 0.0  --     Chemistries   Recent Labs Lab 01/06/15 1658 01/07/15 0708  NA 143 141  K 3.9 4.1  CL 109 110  CO2 29 24  GLUCOSE 87 147*  BUN 21 17  CREATININE 0.95 0.73  CALCIUM 8.8 8.4  MG  --  2.0  AST  --  19  ALT  --  11  ALKPHOS  --  60  BILITOT  --  0.6   ------------------------------------------------------------------------------------------------------------------ estimated creatinine clearance is 38.9 mL/min (by C-G formula based on Cr of 0.73). ------------------------------------------------------------------------------------------------------------------ No results for input(s): HGBA1C  in the last 72 hours. ------------------------------------------------------------------------------------------------------------------ No results for input(s): CHOL, HDL, LDLCALC, TRIG, CHOLHDL, LDLDIRECT in the last 72 hours. ------------------------------------------------------------------------------------------------------------------ No results for input(s): TSH, T4TOTAL, T3FREE, THYROIDAB in the last 72 hours.  Invalid input(s): FREET3 ------------------------------------------------------------------------------------------------------------------  Recent Labs  01/07/15 0708  RETICCTPCT 1.9    Coagulation profile No results for input(s): INR, PROTIME in the last 168 hours.  No results for input(s): DDIMER in the last 72 hours.  Cardiac Enzymes  Recent Labs Lab 01/06/15 1658  TROPONINI <0.03   ------------------------------------------------------------------------------------------------------------------ Invalid input(s): POCBNP    Toddy Boyd D.O. on 01/07/2015 at 11:47 AM  Between 7am to 7pm - Pager - [phone]  After 7pm go to [email or web address] - password [email or web address]  And look for the night coverage person covering for me after hours  [facility] Office  [phone]

## 2015-01-08 LAB — CULTURE, GROUP A STREP: Strep A Culture: NEGATIVE

## 2015-01-08 LAB — URINE CULTURE
Colony Count: NO GROWTH
Culture: NO GROWTH

## 2015-01-08 MED ORDER — PREDNISONE 10 MG (21) PO TBPK: 10.0000 mg | ORAL_TABLET | Freq: Every day | ORAL | Status: DC

## 2015-01-08 MED ORDER — ENSURE PUDDING PO PUDG: 1.0000 | Freq: Three times a day (TID) | ORAL | Status: DC

## 2015-01-08 NOTE — Discharge Instructions (Signed)

## 2015-01-08 NOTE — Discharge Summary (Signed)
Physician Discharge Summary  [name] UKG:[id number] DOB: 29-Nov-1919 DOA: 01/06/2015  PCP: Samantha Font, MD  Admit date: 01/06/2015 Discharge date: 01/08/2015  Time spent: 45 minutes  Recommendations for Outpatient Follow-up:  Patient will be discharged to home with home health nursing and aid.  Patient will need to follow up with primary care provider within one week of discharge.  Patient should continue medications as prescribed.  Patient should follow a soft diet with 1511ml Fluid restriction per day.  Discharge Diagnoses:  Acute diastolic heart failure Dyspnea Essential hypertension Anxiety/depression Dementia/Parkinson's disease Chronic microcytic anemia Insomnia   Discharge Condition: Stable  Diet recommendation: soft diet with 1557ml Fluid restriction per day.  Filed Weights   01/06/15 2305 01/07/15 0615 01/08/15 0541  Weight: 66.316 kg (146 lb 3.2 oz) 65.046 kg (143 lb 6.4 oz) 64 kg (141 lb 1.5 oz)    History of present illness:  on 01/06/2015 by Dr. Linna Darner Ethelle Hampton is a 81 y.o. Female. Level 5 Caveat: pt demented and unable to give reliable history. Pt lives at home w/ daughter. Pt began complaining of throat pain, HA, and back pain. Started today. To the family she appeared to be wheezing and stuggling to breath. To make her symptoms better on Sunday giving her her normal daily medications. Family denies any complaint of chest pain, fever, recent dysuria, abdominal pain, diarrhea, constipation. Patient has to eat with assistance and there is no reported aspiration. Sleeps lying down. No home O2. nml state of health prior to today.  Nothing was tried to make her symptoms better. When asked about home medications, family was unfamiliar with her diuretic but states that she takes her fluid pill every day although it is prescribed only as needed.  Hospital Course:  Acute Diastolic CHF  -Last echocardiogram 09/30/2013 shows EF of 7628%, grade 1 diastolic  dysfunction -12/01/1759 Echocardiogram: EF 60-73%, grade 2 diastolic dysfunction -Patient does use diuretic, Bumex, as needed- follow up with cardiology -BNP 691 upon admission -Per daughter, at bedside, patient's breathing has improved -Monitored daily weights, intake and output, diuresis -UO over past 24rs 2300  Dyspnea -Possibly secondary to CHF versus viral respiratory infection -Patient does not have history of asthma or COPD -Repeat chest x-ray: Central vascular congestion, chronic changes with superimposed interstitial edema or atypical/viral infection. Mild lung base opacities atelectasis versus pneumonia. -Dyspnea improving -Respiratory viral panel pending, strep culture pending -Was placed on nebulizer treatments, Solu-Medrol, supplemental oxygen to maintain saturations above 92% -Patient currently afebrile with no leukocytosis -oxygen weaned to room air.   -Will discharge with medrol dose pack   Essential hypertension -Currently stable, continue metoprolol  Anxiety/depression -Continue Xanax, Celexa  Dementia/Parkinson disease -Continue Haldol PRN, Exelon patch, Cogentin  Chronic microcytic anemia -Hemoglobin appears to be at baseline, continue to monitor CBC  Insomnia -Continue ambien  Code status: Partial, DNI  Procedures  Echocardiogram  Consults  None  Discharge Exam: Filed Vitals:   01/08/15 0501  BP: 142/72  Pulse: 61  Temp: 98 F (36.7 C)  Resp:    Exam  General: Well developed, well nourished, NAD  HEENT: NCAT, mucous membranes moist.   Cardiovascular: S1 S2 auscultated, RRR  Respiratory: Normal respiratory effort, good air movement, no wheezing, rales, or rhonchi noted  Abdomen: Soft, nontender, nondistended, + bowel sounds  Extremities: warm dry without cyanosis clubbing or edema  Discharge Instructions      Discharge Instructions    Discharge instructions    Complete by:  As directed  Patient will be discharged to home  with home health nursing and aid.  Patient will need to follow up with primary care provider within one week of discharge.  Patient should continue medications as prescribed.  Patient should follow a soft diet with 1537ml Fluid restriction per day.            Medication List    STOP taking these medications        carbamide peroxide 6.5 % otic solution  Commonly known as:  DEBROX     levofloxacin 750 MG tablet  Commonly known as:  LEVAQUIN     vancomycin 50 mg/mL oral solution  Commonly known as:  VANCOCIN      TAKE these medications        acetaminophen 500 MG tablet  Commonly known as:  TYLENOL  Take 500 mg by mouth at bedtime. For pain     ALPRAZolam 0.5 MG tablet  Commonly known as:  XANAX  Take 0.5 mg by mouth 3 (three) times daily.     aspirin EC 81 MG tablet  Take 1 tablet (81 mg total) by mouth daily.     benztropine 1 MG tablet  Commonly known as:  COGENTIN  Take 1 tablet (1 mg total) by mouth daily.     bumetanide 0.5 MG tablet  Commonly known as:  BUMEX  Take 1 tablet (0.5 mg total) by mouth daily as needed (swelling).     citalopram 20 MG tablet  Commonly known as:  CELEXA  Take 1 tablet (20 mg total) by mouth every morning.     feeding supplement (ENSURE) Pudg  Take 1 Container by mouth 3 (three) times daily between meals.     haloperidol 2 MG/ML solution  Commonly known as:  HALDOL  Take 1 mg by mouth 2 (two) times daily as needed for agitation.     lactulose 10 GM/15ML solution  Commonly known as:  CHRONULAC  Take 15 mLs (10 g total) by mouth 2 (two) times daily as needed for mild constipation, moderate constipation or severe constipation.     metoprolol 50 MG tablet  Commonly known as:  LOPRESSOR  Take 0.5 tablets (25 mg total) by mouth 2 (two) times daily.     polyethylene glycol packet  Commonly known as:  MIRALAX / GLYCOLAX  Take 17 g by mouth daily.     predniSONE 10 MG (21) Tbpk tablet  Commonly known as:  STERAPRED UNI-PAK 21 TAB    Take 1 tablet (10 mg total) by mouth daily.     rivastigmine 9.5 mg/24hr  Commonly known as:  EXELON  Place 1 patch (9.5 mg total) onto the skin daily. *Remove and discard used patches*     zolpidem 10 MG tablet  Commonly known as:  AMBIEN  Take 10 mg by mouth at bedtime as needed for sleep.       Allergies  Allergen Reactions  . Penicillins Swelling and Rash   Follow-up Information    Follow up with [facility].   Contact information:   [address] [place] [address] [phone]       Follow up with [facility] K, MD. Schedule an appointment as soon as possible for a visit in 1 week.   Specialty:  Family Medicine   Why:  Hospital follow up   Contact information:   [address] [place] [address] [phone]        The results of significant  diagnostics from this hospitalization (including imaging, microbiology, ancillary and laboratory) are listed below for reference.    Significant Diagnostic Studies: Dg Chest Port 1 View  01/07/2015   CLINICAL DATA:  Shortness of breath  EXAM: PORTABLE CHEST - 1 VIEW  COMPARISON:  01/06/2015  FINDINGS: Prominent cardiomediastinal contours. Aortic atherosclerotic calcifications. Central vascular congestion. Interstitial prominence. Mild lung base/retrocardiac opacity. Small effusions not excluded. No pneumothorax. Osteopenia. Multilevel degenerative change. Mandible obscures the apices.  IMPRESSION: Central vascular congestion. Interstitial prominence may reflect chronic changes with superimposed interstitial edema or atypical/ viral infection.  Mild lung base opacities; atelectasis versus pneumonia.  Consider PA and lateral views when the patient can tolerate for better characterization.   Electronically Signed   By: Carlos Levering M.D.   On: 01/07/2015 06:38   Dg Chest Port 1 View  01/06/2015   CLINICAL DATA:  Shortness of breath.  Chest congestion.  Dementia.  EXAM: PORTABLE CHEST - 1 VIEW   COMPARISON:  09/24/2013 and 10/02/2013 heart size and pulmonary vascularity are normal. There is tortuosity and calcification of the thoracic aorta. Minimal scarring in the right midzone and right base. No acute infiltrates or effusions. No acute osseous abnormality.  FINDINGS: No acute disease.  IMPRESSION: No active disease.   Electronically Signed   By: Lorriane Shire M.D.   On: 01/06/2015 16:22    Microbiology: Recent Results (from the past 240 hour(s))  Rapid strep screen     Status: None   Collection Time: 01/06/15  6:04 PM  Result Value Ref Range Status   Streptococcus, Group A Screen (Direct) NEGATIVE NEGATIVE Final    Comment: (NOTE) A Rapid Antigen test may result negative if the antigen level in the sample is below the detection level of this test. The FDA has not cleared this test as a stand-alone test therefore the rapid antigen negative result has reflexed to a Group A Strep culture.      Labs: Basic Metabolic Panel:  Recent Labs Lab 01/06/15 1658 01/07/15 0708  NA 143 141  K 3.9 4.1  CL 109 110  CO2 29 24  GLUCOSE 87 147*  BUN 21 17  CREATININE 0.95 0.73  CALCIUM 8.8 8.4  MG  --  2.0   Liver Function Tests:  Recent Labs Lab 01/07/15 0708  AST 19  ALT 11  ALKPHOS 60  BILITOT 0.6  PROT 7.2  ALBUMIN 3.1*   No results for input(s): LIPASE, AMYLASE in the last 168 hours. No results for input(s): AMMONIA in the last 168 hours. CBC:  Recent Labs Lab 01/06/15 1658 01/07/15 0708  WBC 8.1 9.3  NEUTROABS 5.3  --   HGB 10.6* 10.7*  HCT 35.2* 35.1*  MCV 76.5* 75.0*  PLT 281 270   Cardiac Enzymes:  Recent Labs Lab 01/06/15 1658  TROPONINI <0.03   BNP: BNP (last 3 results)  Recent Labs  01/06/15 1658  BNP 691.0*    ProBNP (last 3 results) No results for input(s): PROBNP in the last 8760 hours.  CBG: No results for input(s): GLUCAP in the last 168 hours.   SignedCristal Ford  [facility] 01/08/2015, 10:32 AM

## 2015-01-08 NOTE — Progress Notes (Signed)
Pt discharged home today per Dr. Ree Kida. Pt's IV site D/C'd and WDL. Pt's VSS. Pt's daughter provided with home medication list, discharge instructions and prescriptions. Verbalized understanding. Pt left floor via WC in stable condition accompanied by NT.

## 2015-01-09 DIAGNOSIS — F329 Major depressive disorder, single episode, unspecified: Secondary | ICD-10-CM | POA: Diagnosis not present

## 2015-01-09 DIAGNOSIS — I5031 Acute diastolic (congestive) heart failure: Secondary | ICD-10-CM | POA: Diagnosis not present

## 2015-01-09 DIAGNOSIS — I10 Essential (primary) hypertension: Secondary | ICD-10-CM | POA: Diagnosis not present

## 2015-01-09 DIAGNOSIS — D649 Anemia, unspecified: Secondary | ICD-10-CM | POA: Diagnosis not present

## 2015-01-09 DIAGNOSIS — G20 Parkinson's disease: Secondary | ICD-10-CM | POA: Diagnosis not present

## 2015-01-09 DIAGNOSIS — F419 Anxiety disorder, unspecified: Secondary | ICD-10-CM | POA: Diagnosis not present

## 2015-01-11 DIAGNOSIS — F419 Anxiety disorder, unspecified: Secondary | ICD-10-CM | POA: Diagnosis not present

## 2015-01-11 DIAGNOSIS — F329 Major depressive disorder, single episode, unspecified: Secondary | ICD-10-CM | POA: Diagnosis not present

## 2015-01-11 DIAGNOSIS — I5031 Acute diastolic (congestive) heart failure: Secondary | ICD-10-CM | POA: Diagnosis not present

## 2015-01-11 DIAGNOSIS — G20 Parkinson's disease: Secondary | ICD-10-CM | POA: Diagnosis not present

## 2015-01-11 DIAGNOSIS — D649 Anemia, unspecified: Secondary | ICD-10-CM | POA: Diagnosis not present

## 2015-01-11 DIAGNOSIS — I10 Essential (primary) hypertension: Secondary | ICD-10-CM | POA: Diagnosis not present

## 2015-01-12 LAB — RESPIRATORY VIRUS PANEL
Adenovirus: NEGATIVE
Influenza A: NEGATIVE
Influenza B: NEGATIVE
Metapneumovirus: NEGATIVE
Parainfluenza 1: NEGATIVE
Parainfluenza 2: NEGATIVE
Parainfluenza 3: NEGATIVE
Respiratory Syncytial Virus A: NEGATIVE
Respiratory Syncytial Virus B: NEGATIVE
Rhinovirus: NEGATIVE

## 2015-01-13 DIAGNOSIS — F419 Anxiety disorder, unspecified: Secondary | ICD-10-CM | POA: Diagnosis not present

## 2015-01-13 DIAGNOSIS — I10 Essential (primary) hypertension: Secondary | ICD-10-CM | POA: Diagnosis not present

## 2015-01-13 DIAGNOSIS — I5031 Acute diastolic (congestive) heart failure: Secondary | ICD-10-CM | POA: Diagnosis not present

## 2015-01-13 DIAGNOSIS — G20 Parkinson's disease: Secondary | ICD-10-CM | POA: Diagnosis not present

## 2015-01-13 DIAGNOSIS — F329 Major depressive disorder, single episode, unspecified: Secondary | ICD-10-CM | POA: Diagnosis not present

## 2015-01-13 DIAGNOSIS — D649 Anemia, unspecified: Secondary | ICD-10-CM | POA: Diagnosis not present

## 2015-01-15 DIAGNOSIS — D649 Anemia, unspecified: Secondary | ICD-10-CM | POA: Diagnosis not present

## 2015-01-15 DIAGNOSIS — F419 Anxiety disorder, unspecified: Secondary | ICD-10-CM | POA: Diagnosis not present

## 2015-01-15 DIAGNOSIS — F329 Major depressive disorder, single episode, unspecified: Secondary | ICD-10-CM | POA: Diagnosis not present

## 2015-01-15 DIAGNOSIS — G20 Parkinson's disease: Secondary | ICD-10-CM | POA: Diagnosis not present

## 2015-01-15 DIAGNOSIS — I5031 Acute diastolic (congestive) heart failure: Secondary | ICD-10-CM | POA: Diagnosis not present

## 2015-01-15 DIAGNOSIS — I10 Essential (primary) hypertension: Secondary | ICD-10-CM | POA: Diagnosis not present

## 2015-01-18 DIAGNOSIS — F419 Anxiety disorder, unspecified: Secondary | ICD-10-CM | POA: Diagnosis not present

## 2015-01-18 DIAGNOSIS — I10 Essential (primary) hypertension: Secondary | ICD-10-CM | POA: Diagnosis not present

## 2015-01-18 DIAGNOSIS — G20 Parkinson's disease: Secondary | ICD-10-CM | POA: Diagnosis not present

## 2015-01-18 DIAGNOSIS — I5031 Acute diastolic (congestive) heart failure: Secondary | ICD-10-CM | POA: Diagnosis not present

## 2015-01-18 DIAGNOSIS — F329 Major depressive disorder, single episode, unspecified: Secondary | ICD-10-CM | POA: Diagnosis not present

## 2015-01-18 DIAGNOSIS — D649 Anemia, unspecified: Secondary | ICD-10-CM | POA: Diagnosis not present

## 2015-01-19 DIAGNOSIS — I5030 Unspecified diastolic (congestive) heart failure: Secondary | ICD-10-CM | POA: Diagnosis not present

## 2015-01-19 DIAGNOSIS — F0390 Unspecified dementia without behavioral disturbance: Secondary | ICD-10-CM | POA: Diagnosis not present

## 2015-01-22 DIAGNOSIS — F329 Major depressive disorder, single episode, unspecified: Secondary | ICD-10-CM | POA: Diagnosis not present

## 2015-01-22 DIAGNOSIS — I5031 Acute diastolic (congestive) heart failure: Secondary | ICD-10-CM | POA: Diagnosis not present

## 2015-01-22 DIAGNOSIS — D649 Anemia, unspecified: Secondary | ICD-10-CM | POA: Diagnosis not present

## 2015-01-22 DIAGNOSIS — I10 Essential (primary) hypertension: Secondary | ICD-10-CM | POA: Diagnosis not present

## 2015-01-22 DIAGNOSIS — F419 Anxiety disorder, unspecified: Secondary | ICD-10-CM | POA: Diagnosis not present

## 2015-01-22 DIAGNOSIS — G20 Parkinson's disease: Secondary | ICD-10-CM | POA: Diagnosis not present

## 2015-03-15 DIAGNOSIS — F0390 Unspecified dementia without behavioral disturbance: Secondary | ICD-10-CM | POA: Diagnosis not present

## 2015-03-15 DIAGNOSIS — J029 Acute pharyngitis, unspecified: Secondary | ICD-10-CM | POA: Diagnosis not present

## 2015-03-24 DIAGNOSIS — I5031 Acute diastolic (congestive) heart failure: Secondary | ICD-10-CM | POA: Diagnosis not present

## 2015-03-24 DIAGNOSIS — C50912 Malignant neoplasm of unspecified site of left female breast: Secondary | ICD-10-CM | POA: Diagnosis not present

## 2015-03-24 DIAGNOSIS — R2681 Unsteadiness on feet: Secondary | ICD-10-CM | POA: Diagnosis not present

## 2015-03-24 DIAGNOSIS — F0391 Unspecified dementia with behavioral disturbance: Secondary | ICD-10-CM | POA: Diagnosis not present

## 2015-04-12 DIAGNOSIS — S60455A Superficial foreign body of left ring finger, initial encounter: Secondary | ICD-10-CM | POA: Diagnosis not present

## 2015-04-12 DIAGNOSIS — I10 Essential (primary) hypertension: Secondary | ICD-10-CM | POA: Diagnosis not present

## 2015-04-12 DIAGNOSIS — F0391 Unspecified dementia with behavioral disturbance: Secondary | ICD-10-CM | POA: Diagnosis not present

## 2015-04-24 DIAGNOSIS — C50912 Malignant neoplasm of unspecified site of left female breast: Secondary | ICD-10-CM | POA: Diagnosis not present

## 2015-04-24 DIAGNOSIS — I5031 Acute diastolic (congestive) heart failure: Secondary | ICD-10-CM | POA: Diagnosis not present

## 2015-04-24 DIAGNOSIS — F0391 Unspecified dementia with behavioral disturbance: Secondary | ICD-10-CM | POA: Diagnosis not present

## 2015-04-24 DIAGNOSIS — R2681 Unsteadiness on feet: Secondary | ICD-10-CM | POA: Diagnosis not present

## 2015-05-25 DIAGNOSIS — C50912 Malignant neoplasm of unspecified site of left female breast: Secondary | ICD-10-CM | POA: Diagnosis not present

## 2015-05-25 DIAGNOSIS — F0391 Unspecified dementia with behavioral disturbance: Secondary | ICD-10-CM | POA: Diagnosis not present

## 2015-05-25 DIAGNOSIS — I5031 Acute diastolic (congestive) heart failure: Secondary | ICD-10-CM | POA: Diagnosis not present

## 2015-05-25 DIAGNOSIS — R2681 Unsteadiness on feet: Secondary | ICD-10-CM | POA: Diagnosis not present

## 2015-06-24 DIAGNOSIS — I5031 Acute diastolic (congestive) heart failure: Secondary | ICD-10-CM | POA: Diagnosis not present

## 2015-06-24 DIAGNOSIS — C50912 Malignant neoplasm of unspecified site of left female breast: Secondary | ICD-10-CM | POA: Diagnosis not present

## 2015-06-24 DIAGNOSIS — R2681 Unsteadiness on feet: Secondary | ICD-10-CM | POA: Diagnosis not present

## 2015-06-24 DIAGNOSIS — F0391 Unspecified dementia with behavioral disturbance: Secondary | ICD-10-CM | POA: Diagnosis not present

## 2015-07-20 DIAGNOSIS — H6123 Impacted cerumen, bilateral: Secondary | ICD-10-CM | POA: Diagnosis not present

## 2015-07-20 DIAGNOSIS — Z23 Encounter for immunization: Secondary | ICD-10-CM | POA: Diagnosis not present

## 2015-07-20 DIAGNOSIS — I10 Essential (primary) hypertension: Secondary | ICD-10-CM | POA: Diagnosis not present

## 2015-07-25 DIAGNOSIS — R2681 Unsteadiness on feet: Secondary | ICD-10-CM | POA: Diagnosis not present

## 2015-07-25 DIAGNOSIS — F0391 Unspecified dementia with behavioral disturbance: Secondary | ICD-10-CM | POA: Diagnosis not present

## 2015-07-25 DIAGNOSIS — C50912 Malignant neoplasm of unspecified site of left female breast: Secondary | ICD-10-CM | POA: Diagnosis not present

## 2015-07-25 DIAGNOSIS — I5031 Acute diastolic (congestive) heart failure: Secondary | ICD-10-CM | POA: Diagnosis not present

## 2015-08-24 DIAGNOSIS — F0391 Unspecified dementia with behavioral disturbance: Secondary | ICD-10-CM | POA: Diagnosis not present

## 2015-08-24 DIAGNOSIS — C50912 Malignant neoplasm of unspecified site of left female breast: Secondary | ICD-10-CM | POA: Diagnosis not present

## 2015-08-24 DIAGNOSIS — I5031 Acute diastolic (congestive) heart failure: Secondary | ICD-10-CM | POA: Diagnosis not present

## 2015-08-24 DIAGNOSIS — R2681 Unsteadiness on feet: Secondary | ICD-10-CM | POA: Diagnosis not present

## 2015-09-24 DIAGNOSIS — I5031 Acute diastolic (congestive) heart failure: Secondary | ICD-10-CM | POA: Diagnosis not present

## 2015-09-24 DIAGNOSIS — C50912 Malignant neoplasm of unspecified site of left female breast: Secondary | ICD-10-CM | POA: Diagnosis not present

## 2015-09-24 DIAGNOSIS — R2681 Unsteadiness on feet: Secondary | ICD-10-CM | POA: Diagnosis not present

## 2015-10-04 DIAGNOSIS — C50012 Malignant neoplasm of nipple and areola, left female breast: Secondary | ICD-10-CM | POA: Diagnosis not present

## 2015-10-18 DIAGNOSIS — I10 Essential (primary) hypertension: Secondary | ICD-10-CM | POA: Diagnosis not present

## 2015-11-22 DIAGNOSIS — C50912 Malignant neoplasm of unspecified site of left female breast: Secondary | ICD-10-CM | POA: Diagnosis not present

## 2015-11-22 DIAGNOSIS — I5031 Acute diastolic (congestive) heart failure: Secondary | ICD-10-CM | POA: Diagnosis not present

## 2015-11-22 DIAGNOSIS — R2681 Unsteadiness on feet: Secondary | ICD-10-CM | POA: Diagnosis not present

## 2015-12-23 DIAGNOSIS — I5031 Acute diastolic (congestive) heart failure: Secondary | ICD-10-CM | POA: Diagnosis not present

## 2015-12-23 DIAGNOSIS — C50912 Malignant neoplasm of unspecified site of left female breast: Secondary | ICD-10-CM | POA: Diagnosis not present

## 2015-12-23 DIAGNOSIS — R2681 Unsteadiness on feet: Secondary | ICD-10-CM | POA: Diagnosis not present

## 2016-01-22 DIAGNOSIS — R2681 Unsteadiness on feet: Secondary | ICD-10-CM | POA: Diagnosis not present

## 2016-01-22 DIAGNOSIS — I5031 Acute diastolic (congestive) heart failure: Secondary | ICD-10-CM | POA: Diagnosis not present

## 2016-01-22 DIAGNOSIS — C50912 Malignant neoplasm of unspecified site of left female breast: Secondary | ICD-10-CM | POA: Diagnosis not present

## 2016-02-07 DIAGNOSIS — C50012 Malignant neoplasm of nipple and areola, left female breast: Secondary | ICD-10-CM | POA: Diagnosis not present

## 2016-02-08 DIAGNOSIS — Z0000 Encounter for general adult medical examination without abnormal findings: Secondary | ICD-10-CM | POA: Diagnosis not present

## 2016-02-08 DIAGNOSIS — I10 Essential (primary) hypertension: Secondary | ICD-10-CM | POA: Diagnosis not present

## 2016-02-22 DIAGNOSIS — C50912 Malignant neoplasm of unspecified site of left female breast: Secondary | ICD-10-CM | POA: Diagnosis not present

## 2016-02-22 DIAGNOSIS — R2681 Unsteadiness on feet: Secondary | ICD-10-CM | POA: Diagnosis not present

## 2016-02-22 DIAGNOSIS — I5031 Acute diastolic (congestive) heart failure: Secondary | ICD-10-CM | POA: Diagnosis not present

## 2016-03-23 DIAGNOSIS — R2681 Unsteadiness on feet: Secondary | ICD-10-CM | POA: Diagnosis not present

## 2016-03-23 DIAGNOSIS — C50912 Malignant neoplasm of unspecified site of left female breast: Secondary | ICD-10-CM | POA: Diagnosis not present

## 2016-03-23 DIAGNOSIS — I5031 Acute diastolic (congestive) heart failure: Secondary | ICD-10-CM | POA: Diagnosis not present

## 2016-06-20 ENCOUNTER — Observation Stay (HOSPITAL_COMMUNITY)
Admission: EM | Admit: 2016-06-20 | Discharge: 2016-06-22 | Disposition: A | Discharge status: Home / Self Care | Payer: Medicare Other | Attending: Internal Medicine | Admitting: Internal Medicine

## 2016-06-20 ENCOUNTER — Emergency Department (HOSPITAL_COMMUNITY): Payer: Medicare Other

## 2016-06-20 ENCOUNTER — Encounter (HOSPITAL_COMMUNITY): Payer: Self-pay | Admitting: Emergency Medicine

## 2016-06-20 DIAGNOSIS — I10 Essential (primary) hypertension: Secondary | ICD-10-CM | POA: Insufficient documentation

## 2016-06-20 DIAGNOSIS — M6281 Muscle weakness (generalized): Secondary | ICD-10-CM

## 2016-06-20 DIAGNOSIS — Z7982 Long term (current) use of aspirin: Secondary | ICD-10-CM | POA: Diagnosis not present

## 2016-06-20 DIAGNOSIS — G9340 Encephalopathy, unspecified: Principal | ICD-10-CM

## 2016-06-20 DIAGNOSIS — G309 Alzheimer's disease, unspecified: Secondary | ICD-10-CM | POA: Insufficient documentation

## 2016-06-20 DIAGNOSIS — R4182 Altered mental status, unspecified: Secondary | ICD-10-CM

## 2016-06-20 DIAGNOSIS — F0280 Dementia in other diseases classified elsewhere without behavioral disturbance: Secondary | ICD-10-CM | POA: Diagnosis not present

## 2016-06-20 DIAGNOSIS — F0390 Unspecified dementia without behavioral disturbance: Secondary | ICD-10-CM | POA: Diagnosis present

## 2016-06-20 DIAGNOSIS — G20 Parkinson's disease: Secondary | ICD-10-CM | POA: Diagnosis present

## 2016-06-20 DIAGNOSIS — Z79899 Other long term (current) drug therapy: Secondary | ICD-10-CM | POA: Insufficient documentation

## 2016-06-20 LAB — URINALYSIS, ROUTINE W REFLEX MICROSCOPIC
Bilirubin Urine: NEGATIVE
Glucose, UA: NEGATIVE mg/dL
Ketones, ur: NEGATIVE mg/dL
Leukocytes, UA: NEGATIVE
Nitrite: NEGATIVE
Protein, ur: NEGATIVE mg/dL
Specific Gravity, Urine: <1.005 — ABNORMAL LOW (ref 1.005–1.030)
pH: 5.5 (ref 5.0–8.0)

## 2016-06-20 LAB — COMPREHENSIVE METABOLIC PANEL
ALT: 17 U/L (ref 14–54)
AST: 22 U/L (ref 15–41)
Albumin: 3.5 g/dL (ref 3.5–5.0)
Alkaline Phosphatase: 80 U/L (ref 38–126)
Anion gap: 5 (ref 5–15)
BUN: 17 mg/dL (ref 6–20)
CO2: 28 mmol/L (ref 22–32)
Calcium: 8.9 mg/dL (ref 8.9–10.3)
Chloride: 104 mmol/L (ref 101–111)
Creatinine, Ser: 1.03 mg/dL — ABNORMAL HIGH (ref 0.44–1.00)
GFR calc Af Amer: 52 mL/min — ABNORMAL LOW (ref >60)
GFR calc non Af Amer: 45 mL/min — ABNORMAL LOW (ref >60)
Glucose, Bld: 127 mg/dL — ABNORMAL HIGH (ref 65–99)
Potassium: 4.4 mmol/L (ref 3.5–5.1)
Sodium: 137 mmol/L (ref 135–145)
Total Bilirubin: 0.3 mg/dL (ref 0.3–1.2)
Total Protein: 7.8 g/dL (ref 6.5–8.1)

## 2016-06-20 LAB — TSH: TSH: 3.653 u[IU]/mL (ref 0.350–4.500)

## 2016-06-20 LAB — URINE MICROSCOPIC-ADD ON

## 2016-06-20 LAB — CBC WITH DIFFERENTIAL/PLATELET
Basophils Absolute: 0 10*3/uL (ref 0.0–0.1)
Basophils Relative: 0 %
Eosinophils Absolute: 0.2 10*3/uL (ref 0.0–0.7)
Eosinophils Relative: 2 %
HCT: 38.4 % (ref 36.0–46.0)
Hemoglobin: 11.9 g/dL — ABNORMAL LOW (ref 12.0–15.0)
Lymphocytes Relative: 14 %
Lymphs Abs: 1.3 10*3/uL (ref 0.7–4.0)
MCH: 23.1 pg — ABNORMAL LOW (ref 26.0–34.0)
MCHC: 31 g/dL (ref 30.0–36.0)
MCV: 74.6 fL — ABNORMAL LOW (ref 78.0–100.0)
Monocytes Absolute: 0.8 10*3/uL (ref 0.1–1.0)
Monocytes Relative: 8 %
Neutro Abs: 6.9 10*3/uL (ref 1.7–7.7)
Neutrophils Relative %: 76 %
Platelets: 227 10*3/uL (ref 150–400)
RBC: 5.15 MIL/uL — ABNORMAL HIGH (ref 3.87–5.11)
RDW: 16.3 % — ABNORMAL HIGH (ref 11.5–15.5)
WBC: 9.1 10*3/uL (ref 4.0–10.5)

## 2016-06-20 LAB — AMMONIA: Ammonia: 13 umol/L (ref 9–35)

## 2016-06-20 MED ORDER — DEXTROSE-NACL 5-0.9 % IV SOLN
INTRAVENOUS | Status: DC
  Administered 2016-06-20: 19:00:00 via INTRAVENOUS

## 2016-06-20 MED ORDER — BENZTROPINE MESYLATE 1 MG PO TABS
1.0000 mg | ORAL_TABLET | Freq: Every day | ORAL | Status: DC
  Administered 2016-06-21 – 2016-06-22 (×2): 1 mg via ORAL
  Filled 2016-06-20 (×2): qty 1

## 2016-06-20 MED ORDER — ASPIRIN EC 81 MG PO TBEC
81.0000 mg | DELAYED_RELEASE_TABLET | Freq: Every day | ORAL | Status: DC
  Administered 2016-06-21 – 2016-06-22 (×2): 81 mg via ORAL
  Filled 2016-06-20 (×2): qty 1

## 2016-06-20 MED ORDER — LORAZEPAM 2 MG/ML IJ SOLN
0.2500 mg | Freq: Once | INTRAMUSCULAR | Status: AC
  Administered 2016-06-20: 0.25 mg via INTRAVENOUS
  Filled 2016-06-20: qty 1

## 2016-06-20 MED ORDER — ACETAMINOPHEN 500 MG PO TABS
500.0000 mg | ORAL_TABLET | Freq: Every day | ORAL | Status: DC
  Administered 2016-06-20 – 2016-06-21 (×2): 500 mg via ORAL
  Filled 2016-06-20 (×2): qty 1

## 2016-06-20 MED ORDER — IPRATROPIUM-ALBUTEROL 0.5-2.5 (3) MG/3ML IN SOLN
3.0000 mL | RESPIRATORY_TRACT | Status: DC
  Administered 2016-06-20 – 2016-06-21 (×7): 3 mL via RESPIRATORY_TRACT
  Filled 2016-06-20 (×7): qty 3

## 2016-06-20 MED ORDER — ALBUTEROL SULFATE (2.5 MG/3ML) 0.083% IN NEBU: 2.5000 mg | INHALATION_SOLUTION | RESPIRATORY_TRACT | Status: DC | PRN

## 2016-06-20 MED ORDER — METOPROLOL TARTRATE 25 MG PO TABS
25.0000 mg | ORAL_TABLET | Freq: Two times a day (BID) | ORAL | Status: DC
  Administered 2016-06-20 – 2016-06-22 (×4): 25 mg via ORAL
  Filled 2016-06-20 (×4): qty 1

## 2016-06-20 MED ORDER — HEPARIN SODIUM (PORCINE) 5000 UNIT/ML IJ SOLN
5000.0000 [IU] | Freq: Three times a day (TID) | INTRAMUSCULAR | Status: DC
  Administered 2016-06-20 – 2016-06-22 (×5): 5000 [IU] via SUBCUTANEOUS
  Filled 2016-06-20 (×5): qty 1

## 2016-06-20 MED ORDER — CITALOPRAM HYDROBROMIDE 20 MG PO TABS
20.0000 mg | ORAL_TABLET | Freq: Every morning | ORAL | Status: DC
  Administered 2016-06-21 – 2016-06-22 (×2): 20 mg via ORAL
  Filled 2016-06-20 (×2): qty 1

## 2016-06-20 MED ORDER — HALOPERIDOL LACTATE 2 MG/ML PO CONC
1.0000 mg | Freq: Two times a day (BID) | ORAL | Status: DC | PRN
  Filled 2016-06-20: qty 0.5

## 2016-06-20 MED ORDER — SODIUM CHLORIDE 0.9 % IV SOLN
Freq: Once | INTRAVENOUS | Status: AC
  Administered 2016-06-20: 12:00:00 via INTRAVENOUS

## 2016-06-20 MED ORDER — ALPRAZOLAM 0.5 MG PO TABS
0.5000 mg | ORAL_TABLET | Freq: Three times a day (TID) | ORAL | Status: DC
  Administered 2016-06-20 – 2016-06-22 (×5): 0.5 mg via ORAL
  Filled 2016-06-20 (×5): qty 1

## 2016-06-20 MED ORDER — ALBUTEROL SULFATE (2.5 MG/3ML) 0.083% IN NEBU
2.5000 mg | INHALATION_SOLUTION | Freq: Four times a day (QID) | RESPIRATORY_TRACT | Status: DC | PRN
  Administered 2016-06-20: 2.5 mg via RESPIRATORY_TRACT
  Filled 2016-06-20: qty 3

## 2016-06-20 MED ORDER — RIVASTIGMINE 9.5 MG/24HR TD PT24
9.5000 mg | MEDICATED_PATCH | Freq: Every day | TRANSDERMAL | Status: DC
  Administered 2016-06-21 – 2016-06-22 (×2): 9.5 mg via TRANSDERMAL
  Filled 2016-06-20 (×4): qty 1

## 2016-06-20 MED ORDER — HALOPERIDOL LACTATE 5 MG/ML IJ SOLN
1.0000 mg | Freq: Once | INTRAMUSCULAR | Status: AC
  Administered 2016-06-20: 1 mg via INTRAVENOUS
  Filled 2016-06-20: qty 1

## 2016-06-20 NOTE — ED Provider Notes (Signed)
[name]   CSN: [id number] Arrival date & time: 06/20/16  1124  By signing my name below, I, [name], attest that this documentation has been prepared under the direction and in the presence of Samantha Morrison, MD. Electronically Signed: Rayna Hampton, ED Scribe. 06/20/16. 12:05 PM.   History   Chief Complaint Chief Complaint  Patient presents with  . Altered Mental Status    HPI HPI Comments: [name] is a 82 y.o. female with a h/o dementia who presents to the Emergency Department complaining of AMS onset this morning. Her relative states that she was acting normally around 4:00 am and used the bathroom. Around 8:30 am she became less responsive and since then she would not wake and has been increasingly unresponsive.They Hampton she has recently experienced congestion and fell two days ago striking her head, back and legs. She normally speaks clearly and is responsive to questioning. No new medications. Pt ambulates with the aid of her relatives. Her relative denies she has experienced fevers, chills, n/v or other associated symptoms at this time.   The history is provided by a relative and medical records. The history is limited by the condition of the patient. No language interpreter was used.    Past Medical History:  Diagnosis Date  . Alzheimer's dementia   . Cancer (HCC)    breast  . Heart attack   . Hypertension   . Parkinson disease [facility])     Patient Active Problem List   Diagnosis Date Noted  . Dyspnea 01/06/2015  . Essential hypertension 01/06/2015  . Depression with anxiety 01/06/2015  . Insomnia 01/06/2015  . Acute on chronic diastolic congestive heart failure ([facility])   . Pneumonia 09/24/2013  . Enteritis due to Clostridium difficile 09/23/2013  . SBO (small bowel obstruction) 09/17/2013  . Lesion of liver 09/17/2013  . Microcytic anemia 04/05/2013  . Elevated troponin 04/04/2013  . UTI (urinary tract infection) 04/04/2013  .  Dehydration 04/04/2013  . Altered mental status 04/04/2013  . Closed right hip fracture ([facility]) 01/31/2013  . Fall 01/31/2013  . Dementia with behavioral disturbance 01/31/2013  . Parkinson disease ([facility]) 01/31/2013  . Community acquired pneumonia 06/30/2012  . Weakness generalized 06/30/2012  . Dementia 06/30/2012  . Proximal phalanx fracture of finger 08/15/2011    Past Surgical History:  Procedure Laterality Date  . ABDOMINAL HYSTERECTOMY    . arm surgery    . BREAST SURGERY    . CHOLECYSTECTOMY    . COLON SURGERY    . HIP PINNING,CANNULATED Right 02/01/2013   Procedure: CANNULATED HIP PINNING;  Surgeon: Sanjuana Kava, MD;  Location: [facility];  Service: Orthopedics;  Laterality: Right;  . LEG SURGERY    . MASTECTOMY    . PELVIC FRACTURE SURGERY    . small intestine removed    . TONSILLECTOMY      OB History    No data available       Home Medications    Prior to Admission medications   Medication Sig Start Date End Date Taking? Authorizing Provider  acetaminophen (TYLENOL) 500 MG tablet Take 500 mg by mouth at bedtime. For pain   Yes Historical Provider, MD  albuterol (PROVENTIL) (2.5 MG/3ML) 0.083% nebulizer solution Take 2.5 mg by nebulization every 6 (six) hours as needed for wheezing or shortness of breath.   Yes Historical Provider, MD  ALPRAZolam Duanne Moron) 0.5 MG tablet Take 0.5 mg by mouth 3 (three) times daily.    Yes Historical Provider, MD  aspirin EC 81 MG tablet Take 1 tablet (81 mg total) by mouth daily. 04/05/13  Yes Kathie Dike, MD  benztropine (COGENTIN) 1 MG tablet Take 1 tablet (1 mg total) by mouth daily. 04/05/13  Yes Kathie Dike, MD  citalopram (CELEXA) 20 MG tablet Take 1 tablet (20 mg total) by mouth every morning. 04/05/13  Yes Kathie Dike, MD  metoprolol (LOPRESSOR) 50 MG tablet Take 0.5 tablets (25 mg total) by mouth 2 (two) times daily. 09/24/13  Yes Kathie Dike, MD  rivastigmine (EXELON) 9.5 mg/24hr Place 1 patch (9.5 mg total) onto the skin  daily. *Remove and discard used patches* 04/05/13  Yes Kathie Dike, MD  zolpidem (AMBIEN) 10 MG tablet Take 10 mg by mouth at bedtime as needed for sleep.   Yes Historical Provider, MD  haloperidol (HALDOL) 2 MG/ML solution Take 1 mg by mouth 2 (two) times daily as needed for agitation.    Historical Provider, MD    Family History Family History  Problem Relation Age of Onset  . Aneurysm Mother   . Diabetes Father     Social History Social History  Substance Use Topics  . Smoking status: Never Smoker  . Smokeless tobacco: Never Used  . Alcohol use No     Allergies   Ensure pudding [nutritional supplements] and Penicillins   Review of Systems Review of Systems  Constitutional: Positive for fatigue. Negative for chills and fever.  Gastrointestinal: Negative for nausea and vomiting.  Neurological: Positive for speech difficulty and weakness.  All other systems reviewed and are negative.  Physical Exam Updated Vital Signs BP 149/82   Pulse (!) 57   Temp 97.5 F (36.4 C) (Oral)   Resp 18   Ht 5\' 7"  (1.702 m)   Wt 145 lb (65.8 kg)   SpO2 96%   BMI 22.71 kg/m   Physical Exam  Constitutional: She is oriented to person, place, and time.  Difficult to exam due to inability to follow commands.   HENT:  Head: Normocephalic and atraumatic.  Eyes: Pupils are equal, round, and reactive to light.  Neck: Normal range of motion. Neck supple.  Cardiovascular: Normal rate.   Pulmonary/Chest: Effort normal. No respiratory distress.  Sparse crackles noted but pt not exhibiting significant effort.   Abdominal: Soft. There is no tenderness.  No signs of tenderness with palpation.   Musculoskeletal: Normal range of motion.  Neurological: She is alert and oriented to person, place, and time.  Left arm weaker than right on exam.   Skin: Skin is warm and dry.  Psychiatric: She has a normal mood and affect.  Nursing Hampton and vitals reviewed.  ED Treatments / Results  Labs (all  labs ordered are listed, but only abnormal results are displayed) Labs Reviewed  CBC WITH DIFFERENTIAL/PLATELET - Abnormal; Notable for the following:       Result Value   RBC 5.15 (*)    Hemoglobin 11.9 (*)    MCV 74.6 (*)    MCH 23.1 (*)    RDW 16.3 (*)    All other components within normal limits  COMPREHENSIVE METABOLIC PANEL - Abnormal; Notable for the following:    Glucose, Bld 127 (*)    Creatinine, Ser 1.03 (*)    GFR calc non Af Amer 45 (*)    GFR calc Af Amer 52 (*)    All other components within normal limits  URINALYSIS, ROUTINE W REFLEX MICROSCOPIC (NOT AT [facility]) - Abnormal; Notable for the following:    Color,  Urine STRAW (*)    Specific Gravity, Urine <1.005 (*)    Hgb urine dipstick LARGE (*)    All other components within normal limits  URINE MICROSCOPIC-ADD ON - Abnormal; Notable for the following:    Squamous Epithelial / LPF 0-5 (*)    Bacteria, UA RARE (*)    All other components within normal limits  URINE CULTURE  AMMONIA    EKG  EKG Interpretation None       Radiology Dg Chest 1 View  Result Date: 06/20/2016 CLINICAL DATA:  Altered mental status. EXAM: CHEST 1 VIEW COMPARISON:  01/06/2015 FINDINGS: Cardiomegaly. No overt edema. No confluent opacities or effusions. No acute bony abnormality. IMPRESSION: Cardiomegaly.  No active disease. Electronically Signed   By: Rolm Baptise M.D.   On: 06/20/2016 13:27   Ct Head Wo Contrast  Result Date: 06/20/2016 CLINICAL DATA:  82 year old hypertensive female presenting with lethargy. Dementia. Initial encounter. EXAM: CT HEAD WITHOUT CONTRAST TECHNIQUE: Contiguous axial images were obtained from the base of the skull through the vertex without intravenous contrast. COMPARISON:  04/04/2013 head CT.  02/11/2014 face CT. FINDINGS: Brain: No intracranial hemorrhage or CT evidence of large acute infarct. Chronic microvascular changes. Moderate to marked global atrophy without hydrocephalus. No intracranial mass lesion  noted on this unenhanced exam. Vascular: Vascular calcifications. Skull: No acute abnormality. Sinuses/Orbits: No acute orbital abnormality. Visualized paranasal sinuses and mastoid air cells are clear with the exception minimal mucosal thickening left sphenoid sinus. Other: Negative IMPRESSION: No acute intracranial abnormality. Marked to marked global atrophy without hydrocephalus. Chronic microvascular changes. Electronically Signed   By: Genia Del M.D.   On: 06/20/2016 13:43    Procedures Procedures  Emergency Ultrasound Study:   Angiocath insertion Performed by: Mariea Clonts  Consent: Verbal consent obtained. Risks and benefits: risks, benefits and alternatives were discussed Immediately prior to procedure the correct patient, procedure, equipment, support staff and site/side marked as needed.  Indication: difficult IV access Preparation: Patient was prepped and draped in the usual sterile fashion. Vein Location: righ tac vein was visualized during assessment for potential access sites and was found to be patent/ easily compressed with linear ultrasound.  The needle was visualized with real-time ultrasound and guided into the vein. Gauge: 20 g long  Image saved and stored.  Normal blood return.  Patient tolerance: Patient tolerated the procedure well with no immediate complications.     DIAGNOSTIC STUDIES: Oxygen Saturation is 97% on RA, normal by my interpretation.    COORDINATION OF CARE: 11:46 AM Discuss next steps with her family. They verbalized understanding and are agreeable with the plan.   Medications Ordered in ED Medications  0.9 %  sodium chloride infusion ( Intravenous New Bag/Given 06/20/16 1222)  LORazepam (ATIVAN) injection 0.25 mg (0.25 mg Intravenous Given 06/20/16 1430)     Initial Impression / Assessment and Plan / ED Course  I have reviewed the triage vital signs and the nursing notes.  Pertinent labs & imaging results that were available  during my care of the patient were reviewed by me and considered in my medical decision making (see chart for details).  Clinical Course   Patient presents with altered mental status since this morning. Patient's blood work, urinalysis and CT head unremarkable. Concern for possible stroke. No fevers or signs infection on exam. Plan for admission to the hospitalist for further workup and evaluation.  The patients results and plan were reviewed and discussed.   Any x-rays performed were independently reviewed  by myself.   Differential diagnosis were considered with the presenting HPI.  Medications  0.9 %  sodium chloride infusion ( Intravenous New Bag/Given 06/20/16 1222)  LORazepam (ATIVAN) injection 0.25 mg (0.25 mg Intravenous Given 06/20/16 1430)    Vitals:   06/20/16 1246 06/20/16 1249 06/20/16 1300 06/20/16 1330  BP:  (!) 135/110 132/74 149/82  Pulse:  (!) 58 (!) 58 (!) 57  Resp:  15 23 18   Temp: 97.5 F (36.4 C)     TempSrc: Oral     SpO2:  100% 95% 96%  Weight:      Height:        Final diagnoses:  Acute encephalopathy    Admission/ observation were discussed with the admitting physician, patient and/or family and they are comfortable with the plan.   Final Clinical Impressions(s) / ED Diagnoses   Final diagnoses:  Acute encephalopathy    New Prescriptions New Prescriptions   No medications on file     Samantha Morrison, MD 06/20/16 1455

## 2016-06-20 NOTE — H&P (Signed)
History and Physical    [name] [id number] DOB: September 07, 1920 DOA: 06/20/2016  PCP: [name], MD  Patient coming from: Home   Chief Complaint: Altered mental status   HPI: [name] is a 82 y.o. female with a past medical history significant for HTN, depression and anxiety, dementia, and parkinson disease, presents with complaints of altered mental status that began this morning around 4. Per family she was completely fine yesterday. Per sister her daughter did not give her Exelon that she was prescribed for one week. Patient lives at home with her relatives. She was given a dose of Haldol and admitted for further evaluation of acute encephalopathy.   ED Course: Creatinine 1.03. Hemoglobin 11.9.   Review of Systems: As per HPI otherwise 10 point review of systems negative.   Past Medical History:  Diagnosis Date  . Alzheimer's dementia   . Cancer (HCC)    breast  . Heart attack   . Hypertension   . Parkinson disease [facility])     Past Surgical History:  Procedure Laterality Date  . ABDOMINAL HYSTERECTOMY    . arm surgery    . BREAST SURGERY    . CHOLECYSTECTOMY    . COLON SURGERY    . HIP PINNING,CANNULATED Right 02/01/2013   Procedure: CANNULATED HIP PINNING;  Surgeon: Sanjuana Kava, MD;  Location: [facility];  Service: Orthopedics;  Laterality: Right;  . LEG SURGERY    . MASTECTOMY    . PELVIC FRACTURE SURGERY    . small intestine removed    . TONSILLECTOMY       reports that she has never smoked. She has never used smokeless tobacco. She reports that she does not drink alcohol or use drugs.  Allergies  Allergen Reactions  . Ensure Pudding [Nutritional Supplements] Swelling and Rash    Throat swelling  . Penicillins Swelling and Rash    Has patient had a PCN reaction causing immediate rash, facial/tongue/throat swelling, SOB or lightheadedness with hypotension: Yes Has patient had a PCN reaction causing severe rash involving mucus membranes or skin necrosis:  Yes Has patient had a PCN reaction that required hospitalization No Has patient had a PCN reaction occurring within the last 10 years: No If all of the above answers are "NO", then may proceed with Cephalosporin use.    Family History  Problem Relation Age of Onset  . Aneurysm Mother   . Diabetes Father      Prior to Admission medications   Medication Sig Start Date End Date Taking? Authorizing Provider  acetaminophen (TYLENOL) 500 MG tablet Take 500 mg by mouth at bedtime. For pain   Yes Historical Provider, MD  albuterol (PROVENTIL) (2.5 MG/3ML) 0.083% nebulizer solution Take 2.5 mg by nebulization every 6 (six) hours as needed for wheezing or shortness of breath.   Yes Historical Provider, MD  ALPRAZolam Duanne Moron) 0.5 MG tablet Take 0.5 mg by mouth 3 (three) times daily.    Yes Historical Provider, MD  aspirin EC 81 MG tablet Take 1 tablet (81 mg total) by mouth daily. 04/05/13  Yes Kathie Dike, MD  benztropine (COGENTIN) 1 MG tablet Take 1 tablet (1 mg total) by mouth daily. 04/05/13  Yes Kathie Dike, MD  citalopram (CELEXA) 20 MG tablet Take 1 tablet (20 mg total) by mouth every morning. 04/05/13  Yes Kathie Dike, MD  metoprolol (LOPRESSOR) 50 MG tablet Take 0.5 tablets (25 mg total) by mouth 2 (two) times daily. 09/24/13  Yes Kathie Dike, MD  rivastigmine (EXELON) 9.5 mg/24hr Place 1 patch (9.5 mg total) onto the skin daily. *Remove and discard used patches* 04/05/13  Yes Kathie Dike, MD  zolpidem (AMBIEN) 10 MG tablet Take 10 mg by mouth at bedtime as needed for sleep.   Yes Historical Provider, MD  haloperidol (HALDOL) 2 MG/ML solution Take 1 mg by mouth 2 (two) times daily as needed for agitation.    Historical Provider, MD    Physical Exam: Vitals:   06/20/16 1246 06/20/16 1249 06/20/16 1300 06/20/16 1330  BP:  (!) 135/110 132/74 149/82  Pulse:  (!) 58 (!) 58 (!) 57  Resp:  15 23 18   Temp: 97.5 F (36.4 C)     TempSrc: Oral     SpO2:  100% 95% 96%  Weight:        Height:          Constitutional: NAD, calm, comfortable Vitals:   06/20/16 1246 06/20/16 1249 06/20/16 1300 06/20/16 1330  BP:  (!) 135/110 132/74 149/82  Pulse:  (!) 58 (!) 58 (!) 57  Resp:  15 23 18   Temp: 97.5 F (36.4 C)     TempSrc: Oral     SpO2:  100% 95% 96%  Weight:      Height:       Eyes: PERRL, lids and conjunctivae normal ENMT: Mucous membranes are moist. Posterior pharynx clear of any exudate or lesions.Normal dentition.  Neck: normal, supple, no masses, no thyromegaly Respiratory: clear to auscultation bilaterally, no wheezing, no crackles. Normal respiratory effort. No accessory muscle use.  Cardiovascular: Regular rate and rhythm, no murmurs / rubs / gallops. No extremity edema. 2+ pedal pulses. No carotid bruits.  Abdomen: no tenderness, no masses palpated. No hepatosplenomegaly. Bowel sounds positive.  Musculoskeletal: no clubbing / cyanosis. No joint deformity upper and lower extremities. Good ROM, no contractures. Normal muscle tone.  Skin: no rashes, lesions, ulcers. No induration Neurologic: CN 2-12 grossly intact. Sensation intact, DTR normal. Strength 5/5 in all 4.  Psychiatric: Normal judgment and insight. Alert and oriented x 3. Normal mood.    Labs on Admission: I have personally reviewed following labs and imaging studies  CBC:  Recent Labs Lab 06/20/16 1157  WBC 9.1  NEUTROABS 6.9  HGB 11.9*  HCT 38.4  MCV 74.6*  PLT [id number]   Basic Metabolic Panel:  Recent Labs Lab 06/20/16 1157  NA 137  K 4.4  CL 104  CO2 28  GLUCOSE 127*  BUN 17  CREATININE 1.03*  CALCIUM 8.9   Liver Function Tests:  Recent Labs Lab 06/20/16 1157  AST 22  ALT 17  ALKPHOS 80  BILITOT 0.3  PROT 7.8  ALBUMIN 3.5    Recent Labs Lab 06/20/16 1157  AMMONIA 13   Urine analysis:    Component Value Date/Time   COLORURINE STRAW (A) 06/20/2016 1158   APPEARANCEUR CLEAR 06/20/2016 1158   LABSPEC <1.005 (L) 06/20/2016 1158   PHURINE 5.5 06/20/2016  1158   GLUCOSEU NEGATIVE 06/20/2016 1158   HGBUR LARGE (A) 06/20/2016 1158   BILIRUBINUR NEGATIVE 06/20/2016 1158   KETONESUR NEGATIVE 06/20/2016 1158   PROTEINUR NEGATIVE 06/20/2016 1158   UROBILINOGEN 0.2 01/06/2015 1842   NITRITE NEGATIVE 06/20/2016 1158   LEUKOCYTESUR NEGATIVE 06/20/2016 1158     Radiological Exams on Admission: Dg Chest 1 View  Result Date: 06/20/2016 CLINICAL DATA:  Altered mental status. EXAM: CHEST 1 VIEW COMPARISON:  01/06/2015 FINDINGS: Cardiomegaly. No overt edema. No confluent opacities or effusions. No acute bony abnormality.  IMPRESSION: Cardiomegaly.  No active disease. Electronically Signed   By: Rolm Baptise M.D.   On: 06/20/2016 13:27   Ct Head Wo Contrast  Result Date: 06/20/2016 CLINICAL DATA:  82 year old hypertensive female presenting with lethargy. Dementia. Initial encounter. EXAM: CT HEAD WITHOUT CONTRAST TECHNIQUE: Contiguous axial images were obtained from the base of the skull through the vertex without intravenous contrast. COMPARISON:  04/04/2013 head CT.  02/11/2014 face CT. FINDINGS: Brain: No intracranial hemorrhage or CT evidence of large acute infarct. Chronic microvascular changes. Moderate to marked global atrophy without hydrocephalus. No intracranial mass lesion noted on this unenhanced exam. Vascular: Vascular calcifications. Skull: No acute abnormality. Sinuses/Orbits: No acute orbital abnormality. Visualized paranasal sinuses and mastoid air cells are clear with the exception minimal mucosal thickening left sphenoid sinus. Other: Negative IMPRESSION: No acute intracranial abnormality. Marked to marked global atrophy without hydrocephalus. Chronic microvascular changes. Electronically Signed   By: Genia Del M.D.   On: 06/20/2016 13:43    EKG: Independently reviewed.   Assessment/Plan Active Problems:   * No active hospital problems. *  1. Acute encephalopathy. Head CT negative. Per family she has not had her medication for one  week. Continue Haldol.  2. HTN. Pressures are elevated. On lopressor.  3. Parkinson's disease. Continue current medication.  4. Dementia. Noted.   DVT prophylaxis: SCDs  Code Status: Partial - no intubation  Family Communication: Sister and daughter bedside Disposition Plan: Discharge home once improved.  Consults called: None  Admission status: Inpatient    Orvan Falconer, MD FACP [facility] If 7PM-7AM, please contact night-coverage [email or web address] Password [email or web address]  06/20/2016, 2:56 PM    By signing my name below, I, Collene Leyden, attest that this documentation has been prepared under the direction and in the presence of Orvan Falconer, MD. Electronically signed: Collene Leyden, Scribe. 06/20/16 2:50 PM

## 2016-06-20 NOTE — ED Triage Notes (Signed)
Per family, pt has not been communicating as well as usual today and has been very lethargic.  Unable to get a clear story as to when it began.  Pt has dementia.

## 2016-06-20 NOTE — ED Notes (Signed)
This nurse made one attempt at IV. This was unsuccessful. Pt has left arm restriction and surgical scars in the right arm.

## 2016-06-21 DIAGNOSIS — F0280 Dementia in other diseases classified elsewhere without behavioral disturbance: Secondary | ICD-10-CM

## 2016-06-21 DIAGNOSIS — G9340 Encephalopathy, unspecified: Secondary | ICD-10-CM | POA: Diagnosis not present

## 2016-06-21 DIAGNOSIS — F0390 Unspecified dementia without behavioral disturbance: Secondary | ICD-10-CM | POA: Diagnosis not present

## 2016-06-21 MED ORDER — ALBUTEROL SULFATE (2.5 MG/3ML) 0.083% IN NEBU
2.5000 mg | INHALATION_SOLUTION | Freq: Four times a day (QID) | RESPIRATORY_TRACT | Status: DC | PRN
Start: 2016-06-21 — End: 2016-06-22

## 2016-06-21 MED ORDER — HALOPERIDOL 2 MG PO TABS: 1.0000 mg | ORAL_TABLET | Freq: Two times a day (BID) | ORAL | Status: DC | PRN

## 2016-06-21 MED ORDER — INFLUENZA VAC SPLIT QUAD 0.5 ML IM SUSY
0.5000 mL | PREFILLED_SYRINGE | INTRAMUSCULAR | Status: AC
  Administered 2016-06-22: 0.5 mL via INTRAMUSCULAR
  Filled 2016-06-21: qty 0.5

## 2016-06-21 MED ORDER — HALOPERIDOL LACTATE 5 MG/ML IJ SOLN: 1.0000 mg | Freq: Two times a day (BID) | INTRAMUSCULAR | Status: DC | PRN

## 2016-06-21 NOTE — Evaluation (Signed)
Physical Therapy Evaluation Patient Details Name: [name] MRN: [id number] DOB: 07-11-1920 Today's Date: 06/21/2016   History of Present Illness  [name] is a 82 y.o. female with a past medical history significant for HTN, depression and anxiety, dementia, and parkinson disease, presents with complaints of altered mental status that began this morning around 4. Per family she was completely fine yesterday. Per sister her daughter did not give her Exelon that she was prescribed for one week. Patient lives at home with her relatives. She was given a dose of Haldol and admitted for further evaluation of acute encephalopathy.   Clinical Impression  Pt found sitting on [facility] with family members present upon arrival. She was disoriented x4. She has been living at home with her daughter and receiving 24/7 assistance with all ADLs, along with all necessary medical equipment to aid in her care. She requires ModA and 2 person assist to transition sit to stand from [facility] and was able to perform stand pivot transfer with ModA to prevent LOB and crashing onto the bed. She was able to take a couple of steps along side of her bed, however it was unsafe to trial ambulation further due to pt keeping her eyes closed and with noted posterior lean. She requires MaxA from sit to supine back in the bed and x2 total assist with scooting to [facility]. The biggest area of concern at this time is 3 STE her home. Currently, she is largely impaired by her altered mental status. I am recommending skilled PT at SNF to address her limitations and improve her strength/mobility for return home with her daughter, pending there is no improvement in mental status. I discussed this with her daughter who is in not in agreement and feels she can get her up the steps with more help.     Follow Up Recommendations SNF    Equipment Recommendations  None recommended by PT    Recommendations for Other Services       Precautions / Restrictions  Precautions Precautions: None      Mobility  Bed Mobility Overal bed mobility: +2 for physical assistance             General bed mobility comments: unable to follow commands to scoot to [facility]. x2 total assist with foot of bed elevated   Transfers Overall transfer level: Needs assistance Equipment used: None Transfers: Sit to/from [facility] Sit to Stand: Mod assist;+2 safety/equipment Stand pivot transfers: Mod assist;+2 safety/equipment       General transfer comment: Posterior lean noted; able to guide pt from sit to stand with HHA and 2nd person assist to guide hips towards bed and prevent LOB backwards  Ambulation/Gait Ambulation/Gait assistance: Mod assist Ambulation Distance (Feet): 3 Feet Assistive device: 2 person hand held assist Gait Pattern/deviations: Decreased step length - right;Decreased step length - left;Leaning posteriorly;Trunk flexed   Gait velocity interpretation: <1.8 ft/sec, indicative of risk for recurrent falls General Gait Details: unable to attempt ambulation secondary to poor safety awarenss and pt keeping her eyes closed  Stairs            Wheelchair Mobility    Modified Rankin (Stroke Patients Only)       Balance Overall balance assessment: Needs assistance Sitting-balance support: Bilateral upper extremity supported;Feet unsupported Sitting balance-Leahy Scale: Fair     Standing balance support: Bilateral upper extremity supported Standing balance-Leahy Scale: Poor Standing balance comment: posterior lean  Pertinent Vitals/Pain Pain Assessment: Faces Faces Pain Scale: Hurts even more Pain Location: unsure of pain location. Pt began crying several times during evaluation.     Home Living Family/patient expects to be discharged to:: Private residence Living Arrangements: Children Available Help at Discharge: Family;Available 24 hours/day Type of Home: House Home  Access: Stairs to enter Entrance Stairs-Rails: Psychiatric nurse of Steps: 3 Home Layout: One level Home Equipment: Walker - 2 wheels;Wheelchair - manual;Hospital bed;Other (comment) (hoyer lift) Additional Comments: daughter reports pt occasionally "sits on the floor" so they use the hoyer to get her back up.    Prior Function Level of Independence: Needs assistance   Gait / Transfers Assistance Needed: Needs assistance out of some chair, but not others  ADL's / Homemaking Assistance Needed: Aide comes 8hr/day to perform light cleaning, bathing, feeding and other personal hygiene tasks        Hand Dominance        Extremity/Trunk Assessment   Upper Extremity Assessment: Generalized weakness           Lower Extremity Assessment: Generalized weakness         Communication   Communication: Other (comment) (AMS, minimal interraction with therapist)  Cognition Arousal/Alertness: Lethargic Behavior During Therapy: Flat affect;Restless (periods of flat affect and restlessness) Overall Cognitive Status: Impaired/Different from baseline Area of Impairment: Orientation;Attention;Memory;Following commands;Safety/judgement Orientation Level: Disoriented to;Place;Time;Situation;Person     Following Commands: Follows one step commands inconsistently;Follows multi-step commands inconsistently;Follows one step commands with increased time Safety/Judgement: Decreased awareness of safety;Decreased awareness of deficits     General Comments: family members report this is improved from yesterday    General Comments      Exercises     Assessment/Plan    PT Assessment Patient needs continued PT services  PT Problem List Decreased strength;Decreased activity tolerance;Decreased balance;Decreased mobility;Decreased safety awareness;Pain;Decreased knowledge of use of DME          PT Treatment Interventions Gait training;Stair training;Functional mobility  training;Therapeutic activities;Therapeutic exercise;Balance training;Manual techniques;Patient/family education;Neuromuscular re-education    PT Goals (Current goals can be found in the Care Plan section)  Acute Rehab PT Goals Patient Stated Goal: improve mobility and strength  PT Goal Formulation: With patient/family Time For Goal Achievement: 07/12/16 Potential to Achieve Goals: Fair    Frequency Min 4X/week   Barriers to discharge Inaccessible home environment Pt has 3 STE and at this time is unsafe to attempt due to inability to ambulate more than 2-3 ft with ModA and 2 person assist    Co-evaluation               End of Session Equipment Utilized During Treatment: Gait belt Activity Tolerance: Treatment limited secondary to medical complications (Comment) Patient left: in bed;with family/visitor present;with call bell/phone within reach      Functional Assessment Tool Used: Clinical judgement based on assessment of fucntional strength and mobility  Functional Limitation: Mobility: Walking and moving around Mobility: Walking and Moving Around Current Status [id number]): At least 60 percent but less than 80 percent impaired, limited or restricted Mobility: Walking and Moving Around Goal Status [phone]): At least 40 percent but less than 60 percent impaired, limited or restricted    Time: 1540-1606 PT Time Calculation (min) (ACUTE ONLY): 26 min   Charges:   PT Evaluation $PT Eval Low Complexity: 1 Procedure PT Treatments $Therapeutic Activity: 8-22 mins $Self Care/Home Management: 8-22   PT G Codes:   PT G-Codes **NOT FOR INPATIENT CLASS** Functional Assessment Tool Used: Clinical judgement  based on assessment of fucntional strength and mobility  Functional Limitation: Mobility: Walking and moving around Mobility: Walking and Moving Around Current Status [phone]): At least 60 percent but less than 80 percent impaired, limited or restricted Mobility: Walking and Moving  Around Goal Status [phone]): At least 40 percent but less than 60 percent impaired, limited or restricted    4:37 PM,06/21/16 Elly Modena PT, [facility] [phone]

## 2016-06-21 NOTE — Progress Notes (Addendum)
RT in to administer scheduled neb tx. Pt appears increasingly more agitated as treatment is given. Tx finished, pt assessed to PRN per RT protocol assessment and Pt's home reg. Mostly clear BBS, diminished in bases. Humidity added to O2 for pt comfort. RT will administer PRN tx if necessary for SOB/wheezing. RT will continue to monitor.

## 2016-06-21 NOTE — Care Management Note (Signed)
Case Management Note  Patient Details  Name: [name] MRN: [id number] Date of Birth: 31-Dec-1919  Subjective/Objective:                  Pt admitted with AMS. She is from home, lives with her daughter and has advanced dementia. Pt requires assistance with ADL's at baseline. Pt is active with [facility]. They are aware of her admission and will be made aware of DC when that happens. Her daughter provides 24/7 care for pt. She ambulates with stand by assist.  Action/Plan: Will cont to follow for DC planning needs.   Expected Discharge Date:      06/22/2016            Expected Discharge Plan:  [place]  In-House Referral:  NA  Discharge planning Services  CM Consult  Post Acute Care Choice:  Home Health, Resumption of Svcs/PTA Provider Choice offered to:  Patient  DME Arranged:    DME Agency:     HH Arranged:  RN, PT, Nurse's Aide [place] Agency:  [facility]  Status of Service:  In process, will continue to follow  If discussed at Long Length of Stay Meetings, dates discussed:    Additional Comments:  Sherald Barge, RN 06/21/2016, 10:45 AM

## 2016-06-21 NOTE — Care Management Obs Status (Signed)
[facility] NOTIFICATION   Patient Details  Name: [name] MRN: [id number] Date of Birth: May 18, 1920   Medicare Observation Status Notification Given:  Yes    Sherald Barge, RN 06/21/2016, 10:44 AM

## 2016-06-21 NOTE — Progress Notes (Signed)
PROGRESS NOTE                                                                                                                                                                                                             Patient Demographics:    [name], is a 82 y.o. female, DOB - 1920/04/16, [id number]  Admit date - 06/20/2016   Admitting Physician Orvan Falconer, MD  Outpatient Primary MD for the patient is [name], MD  LOS - 0  Chief Complaint  Patient presents with  . Altered Mental Status       Brief Narrative   [name] is a 82 y.o. female with a past medical history significant for HTN, depression and anxiety, dementia, and parkinson disease, presents with complaints of altered mental status that began this morning around 4. Per family she was completely fine yesterday. Per sister her daughter did not give her Exelon that she was prescribed for one week. Patient lives at home with her relatives. She was given a dose of Haldol and admitted for further evaluation of acute encephalopathy.     Subjective:    [name] today has, No headache, No chest pain, No abdominal pain - No Nausea, No new weakness tingling or numbness, No Cough - SOB.     Assessment  & Plan :     1.Encephalopathy due to missing of medications at home. No focal deficits, head CT nonacute and patient's family not interested in any further extensive workup, her family she is already close to her baseline and with her age they want minimal workup done. I concur, home medications resumed with good effect, will do PT eval, increase activity if stable discharge in the morning, family wants to take her home.  2. Essential hypertension. Continue Lopressor blood pressure is stable.  3. History of Parkinson's disease with dementia. Currently at baseline home medications resumed. We'll continue to monitor.   Family Communication  :  daughter  Code Status :  No Intubation  Diet : Soft  Disposition Plan  :  Home in am  Consults  :  None  Procedures  :    CT Head - non acute  DVT Prophylaxis  :   Heparin    Lab Results  Component Value Date   PLT 227 06/20/2016  Inpatient Medications  Scheduled Meds: . acetaminophen  500 mg Oral QHS  . ALPRAZolam  0.5 mg Oral TID  . aspirin EC  81 mg Oral Daily  . benztropine  1 mg Oral Daily  . citalopram  20 mg Oral q morning - 10a  . heparin  5,000 Units Subcutaneous Q8H  . [START ON 06/22/2016] Influenza vac split quadrivalent PF  0.5 mL Intramuscular Tomorrow-1000  . ipratropium-albuterol  3 mL Nebulization Q4H  . metoprolol  25 mg Oral BID  . rivastigmine  9.5 mg Transdermal Daily   Continuous Infusions: . dextrose 5 % and 0.9% NaCl 50 mL/hr at 06/20/16 1915   PRN Meds:.albuterol, haloperidol **OR** haloperidol lactate  Antibiotics  :    Anti-infectives    None         Objective:   Vitals:   06/21/16 0435 06/21/16 0452 06/21/16 0739 06/21/16 1209  BP: (!) 108/92     Pulse: 64     Resp: 19     Temp: 97.6 F (36.4 C)     TempSrc: Oral     SpO2: 97% 93% (!) 71% 90%  Weight:      Height:        Wt Readings from Last 3 Encounters:  06/20/16 64.9 kg (143 lb)  01/08/15 64 kg (141 lb 1.5 oz)  09/17/13 55.1 kg (121 lb 7.6 oz)     Intake/Output Summary (Last 24 hours) at 06/21/16 1223 Last data filed at 06/21/16 0300  Gross per 24 hour  Intake            387.5 ml  Output              365 ml  Net             22.5 ml     Physical Exam  Awake , pleasantly confused, No new F.N deficits, Normal affect [place].AT,PERRAL Supple Neck,No JVD, No cervical lymphadenopathy appriciated.  Symmetrical Chest wall movement, Good air movement bilaterally, CTAB RRR,No Gallops,Rubs or new Murmurs, No Parasternal Heave +ve B.Sounds, Abd Soft, No tenderness, No organomegaly appriciated, No rebound - guarding or rigidity. No Cyanosis, Clubbing or edema, No  new Rash or bruise       Data Review:    CBC  Recent Labs Lab 06/20/16 1157  WBC 9.1  HGB 11.9*  HCT 38.4  PLT 227  MCV 74.6*  MCH 23.1*  MCHC 31.0  RDW 16.3*  LYMPHSABS 1.3  MONOABS 0.8  EOSABS 0.2  BASOSABS 0.0    Chemistries   Recent Labs Lab 06/20/16 1157  NA 137  K 4.4  CL 104  CO2 28  GLUCOSE 127*  BUN 17  CREATININE 1.03*  CALCIUM 8.9  AST 22  ALT 17  ALKPHOS 80  BILITOT 0.3   ------------------------------------------------------------------------------------------------------------------ No results for input(s): CHOL, HDL, LDLCALC, TRIG, CHOLHDL, LDLDIRECT in the last 72 hours.  No results found for: HGBA1C ------------------------------------------------------------------------------------------------------------------  Recent Labs  06/20/16 1157  TSH 3.653   ------------------------------------------------------------------------------------------------------------------ No results for input(s): VITAMINB12, FOLATE, FERRITIN, TIBC, IRON, RETICCTPCT in the last 72 hours.  Coagulation profile No results for input(s): INR, PROTIME in the last 168 hours.  No results for input(s): DDIMER in the last 72 hours.  Cardiac Enzymes No results for input(s): CKMB, TROPONINI, MYOGLOBIN in the last 168 hours.  Invalid input(s): CK ------------------------------------------------------------------------------------------------------------------    Component Value Date/Time   BNP 691.0 (H) 01/06/2015 1658    Micro Results No results found for this or any  previous visit (from the past 240 hour(s)).  Radiology Reports Dg Chest 1 View  Result Date: 06/20/2016 CLINICAL DATA:  Altered mental status. EXAM: CHEST 1 VIEW COMPARISON:  01/06/2015 FINDINGS: Cardiomegaly. No overt edema. No confluent opacities or effusions. No acute bony abnormality. IMPRESSION: Cardiomegaly.  No active disease. Electronically Signed   By: Rolm Baptise M.D.   On: 06/20/2016  13:27   Ct Head Wo Contrast  Result Date: 06/20/2016 CLINICAL DATA:  82 year old hypertensive female presenting with lethargy. Dementia. Initial encounter. EXAM: CT HEAD WITHOUT CONTRAST TECHNIQUE: Contiguous axial images were obtained from the base of the skull through the vertex without intravenous contrast. COMPARISON:  04/04/2013 head CT.  02/11/2014 face CT. FINDINGS: Brain: No intracranial hemorrhage or CT evidence of large acute infarct. Chronic microvascular changes. Moderate to marked global atrophy without hydrocephalus. No intracranial mass lesion noted on this unenhanced exam. Vascular: Vascular calcifications. Skull: No acute abnormality. Sinuses/Orbits: No acute orbital abnormality. Visualized paranasal sinuses and mastoid air cells are clear with the exception minimal mucosal thickening left sphenoid sinus. Other: Negative IMPRESSION: No acute intracranial abnormality. Marked to marked global atrophy without hydrocephalus. Chronic microvascular changes. Electronically Signed   By: Genia Del M.D.   On: 06/20/2016 13:43    Time Spent in minutes  30   Genevive Printup K M.D on 06/21/2016 at 12:23 PM  Between 7am to 7pm - Pager - [phone]  After 7pm go to [email or web address] - password [email or web address]  [facility] -  Office  [phone]

## 2016-06-21 NOTE — Clinical Social Work Note (Signed)
CSW received consult for possible SNF. Awaiting PT evaluation. Will follow up when appropriate.  [name], [place]

## 2016-06-22 DIAGNOSIS — G9340 Encephalopathy, unspecified: Secondary | ICD-10-CM | POA: Diagnosis not present

## 2016-06-22 DIAGNOSIS — F0390 Unspecified dementia without behavioral disturbance: Secondary | ICD-10-CM | POA: Diagnosis not present

## 2016-06-22 LAB — URINE CULTURE: Culture: NO GROWTH

## 2016-06-22 MED ORDER — FUROSEMIDE 40 MG PO TABS
40.0000 mg | ORAL_TABLET | Freq: Once | ORAL | Status: AC
  Administered 2016-06-22: 40 mg via ORAL
  Filled 2016-06-22: qty 1

## 2016-06-22 MED ORDER — ALBUTEROL SULFATE (2.5 MG/3ML) 0.083% IN NEBU
2.5000 mg | INHALATION_SOLUTION | Freq: Once | RESPIRATORY_TRACT | Status: AC
  Administered 2016-06-22: 2.5 mg via RESPIRATORY_TRACT
  Filled 2016-06-22: qty 3

## 2016-06-22 NOTE — Care Management (Signed)
CM notified by [facility] they will not be able to provide nursing and PT. Pt's daughter would like to use [facility] as they have also used them in the past. Romualdo Bolk, of [facility], aware of referral and will obtain pt info from chart. Pt's daughter aware they have 48hrs to make first visit.

## 2016-06-22 NOTE — Progress Notes (Signed)
[name] discharged Home per MD order.  Discharge instructions reviewed and discussed with the pt's daughter, all questions and concerns answered. Copy of instructions given to daughter.    Medication List    TAKE these medications   acetaminophen 500 MG tablet Commonly known as:  TYLENOL Take 500 mg by mouth at bedtime. For pain   albuterol (2.5 MG/3ML) 0.083% nebulizer solution Commonly known as:  PROVENTIL Take 2.5 mg by nebulization every 6 (six) hours as needed for wheezing or shortness of breath.   ALPRAZolam 0.5 MG tablet Commonly known as:  XANAX Take 0.5 mg by mouth 3 (three) times daily.   aspirin EC 81 MG tablet Take 1 tablet (81 mg total) by mouth daily.   benztropine 1 MG tablet Commonly known as:  COGENTIN Take 1 tablet (1 mg total) by mouth daily.   citalopram 20 MG tablet Commonly known as:  CELEXA Take 1 tablet (20 mg total) by mouth every morning.   haloperidol 2 MG/ML solution Commonly known as:  HALDOL Take 1 mg by mouth 2 (two) times daily as needed for agitation.   metoprolol 50 MG tablet Commonly known as:  LOPRESSOR Take 0.5 tablets (25 mg total) by mouth 2 (two) times daily.   rivastigmine 9.5 mg/24hr Commonly known as:  EXELON Place 1 patch (9.5 mg total) onto the skin daily. *Remove and discard used patches*   zolpidem 10 MG tablet Commonly known as:  AMBIEN Take 10 mg by mouth at bedtime as needed for sleep.        Patient escorted to car in a wheelchair,  no distress noted upon discharge.  Ralene Muskrat Dezzie Badilla 06/22/2016 12:52 PM

## 2016-06-22 NOTE — Care Management Note (Signed)
Case Management Note  Patient Details  Name: ARIANNY PRISBREY MRN: [id number] Date of Birth: 01/07/1920  Expected Discharge Date:     06/22/2016             Expected Discharge Plan:  [place]  In-House Referral:  NA  Discharge planning Services  CM Consult  Post Acute Care Choice:  Home Health, Resumption of Svcs/PTA Provider Choice offered to:  Patient  DME Arranged:    DME Agency:     HH Arranged:  RN, PT, Nurse's Aide [place] Agency:  [facility]  Status of Service:  Completed, signed off  If discussed at [place] of Stay Meetings, dates discussed:    Additional Comments: Pt discharging home today. Pt family has refused SNF. Pt will return home with [facility] services RN/PT through [facility]. Towanda Malkin, of [facility], is aware of referral and will obtain info from chart. Family aware HH has 48hrs to initiate services.   Sherald Barge, RN 06/22/2016, 9:19 AM

## 2016-06-22 NOTE — Discharge Summary (Signed)
[name] [id number] DOB: December 20, 1919 DOA: 06/20/2016  PCP: [name], MD  Admit date: 06/20/2016  Discharge date: 06/22/2016  Admitted From: Home   Disposition:  Home, family refused SNF   Recommendations for Outpatient Follow-up:   Follow up with PCP in 1-2 weeks  PCP Please obtain BMP/CBC, 2 view CXR in 1week,  (see Discharge instructions)   PCP Please follow up on the following pending results: None   Home Health: Aide   Equipment/Devices: None  Consultations: None Discharge Condition: Fair   CODE STATUS: No Intubation   Diet Recommendation: Soft diet with full feeding assistance and aspiration precautions   Chief Complaint  Patient presents with  . Altered Mental Status     Brief history of present illness from the day of admission and additional interim summary    [name] a 82 y.o.femalewith a past medical history significant for HTN, depression and anxiety, dementia, and parkinson disease, presents with complaints of altered mental status that began this morning around 4. Per family she was completely fine yesterday. Per sister her daughter did not give her Exelon that she was prescribed for one week. Patient lives at home with her relatives. She was given a dose of Haldol and admitted for further evaluation of acute encephalopathy.   Hospital issues addressed       1.Encephalopathy due to missing of medications at home. No focal deficits, head CT nonacute and patient's family not interested in any further extensive workup, her family she is already close to her baseline and with her age they want minimal workup done. I concur, home medications resumed with good effect, She is now back to her baseline, seen by PT but patient and family refused SNF, they have a nursing aide coming to  her home which will be resumed, resume all home medications, note patient also takes Lasix at home which is not listed in the medication list and the daughter does not recall the dose, she has been verbally told to continue the home dose unchanged, patient should follow with PCP within a week to get repeat CBC, BMP and a 2 view chest x-ray done. Per family she is back to her baseline at this time.  2. Essential hypertension. Continue Lopressor blood pressure is stable.  3. History of Parkinson's disease with dementia. Currently at baseline home medications resumed. Follow with PCP within a week.   Discharge diagnosis     Principal Problem:   Altered mental status Active Problems:   Dementia   Parkinson disease [facility])   Essential hypertension    Discharge instructions    Discharge Instructions    Discharge instructions    Complete by:  As directed    Follow with Primary MD [name], MD in 7 days   Get CBC, CMP, 2 view Chest X ray checked  by Primary MD or SNF MD in 5-7 days ( we routinely change or add medications that can affect your baseline labs and fluid status, therefore we recommend that you  get the mentioned basic workup next visit with your PCP, your PCP may decide not to get them or add new tests based on their clinical decision)   Activity: As tolerated with Full fall precautions use walker/cane & assistance as needed   Disposition Home     Diet:   Soft with feeding assistance and aspiration precautions.  For Heart failure patients - Check your Weight same time everyday, if you gain over 2 pounds, or you develop in leg swelling, experience more shortness of breath or chest pain, call your Primary MD immediately. Follow Cardiac Low Salt Diet and 1.5 lit/day fluid restriction.   On your next visit with your primary care physician please Get Medicines reviewed and adjusted.   Please request your Prim.MD to go over all Hospital Tests and Procedure/Radiological  results at the follow up, please get all Hospital records sent to your Prim MD by signing hospital release before you go home.   If you experience worsening of your admission symptoms, develop shortness of breath, life threatening emergency, suicidal or homicidal thoughts you must seek medical attention immediately by calling 911 or calling your MD immediately  if symptoms less severe.  You Must read complete instructions/literature along with all the possible adverse reactions/side effects for all the Medicines you take and that have been prescribed to you. Take any new Medicines after you have completely understood and accpet all the possible adverse reactions/side effects.   Do not drive, operate heavy machinery, perform activities at heights, swimming or participation in water activities or provide baby sitting services if your were admitted for syncope or siezures until you have seen by Primary MD or a Neurologist and advised to do so again.  Do not drive when taking Pain medications.    Do not take more than prescribed Pain, Sleep and Anxiety Medications  Special Instructions: If you have smoked or chewed Tobacco  in the last 2 yrs please stop smoking, stop any regular Alcohol  and or any Recreational drug use.  Wear Seat belts while driving.   Please note  You were cared for by a hospitalist during your hospital stay. If you have any questions about your discharge medications or the care you received while you were in the hospital after you are discharged, you can call the unit and asked to speak with the hospitalist on call if the hospitalist that took care of you is not available. Once you are discharged, your primary care physician will handle any further medical issues. Please note that NO REFILLS for any discharge medications will be authorized once you are discharged, as it is imperative that you return to your primary care physician (or establish a relationship with a primary care  physician if you do not have one) for your aftercare needs so that they can reassess your need for medications and monitor your lab values.   Increase activity slowly    Complete by:  As directed       Discharge Medications     Medication List    TAKE these medications   acetaminophen 500 MG tablet Commonly known as:  TYLENOL Take 500 mg by mouth at bedtime. For pain   albuterol (2.5 MG/3ML) 0.083% nebulizer solution Commonly known as:  PROVENTIL Take 2.5 mg by nebulization every 6 (six) hours as needed for wheezing or shortness of breath.   ALPRAZolam 0.5 MG tablet Commonly known as:  XANAX Take 0.5 mg by mouth 3 (three) times daily.   aspirin EC 81  MG tablet Take 1 tablet (81 mg total) by mouth daily.   benztropine 1 MG tablet Commonly known as:  COGENTIN Take 1 tablet (1 mg total) by mouth daily.   citalopram 20 MG tablet Commonly known as:  CELEXA Take 1 tablet (20 mg total) by mouth every morning.   haloperidol 2 MG/ML solution Commonly known as:  HALDOL Take 1 mg by mouth 2 (two) times daily as needed for agitation.   metoprolol 50 MG tablet Commonly known as:  LOPRESSOR Take 0.5 tablets (25 mg total) by mouth 2 (two) times daily.   rivastigmine 9.5 mg/24hr Commonly known as:  EXELON Place 1 patch (9.5 mg total) onto the skin daily. *Remove and discard used patches*   zolpidem 10 MG tablet Commonly known as:  AMBIEN Take 10 mg by mouth at bedtime as needed for sleep.       Follow-up Information    [name], MD. Schedule an appointment as soon as possible for a visit in 1 week(s).   Specialty:  Family Medicine Contact information: [address] [place] [address] [phone]           Major procedures and Radiology Reports - PLEASE review detailed and final reports thoroughly  -         Dg Chest 1 View  Result Date: 06/20/2016 CLINICAL DATA:  Altered mental status. EXAM: CHEST 1 VIEW COMPARISON:  01/06/2015 FINDINGS:  Cardiomegaly. No overt edema. No confluent opacities or effusions. No acute bony abnormality. IMPRESSION: Cardiomegaly.  No active disease. Electronically Signed   By: Rolm Baptise M.D.   On: 06/20/2016 13:27   Ct Head Wo Contrast  Result Date: 06/20/2016 CLINICAL DATA:  82 year old hypertensive female presenting with lethargy. Dementia. Initial encounter. EXAM: CT HEAD WITHOUT CONTRAST TECHNIQUE: Contiguous axial images were obtained from the base of the skull through the vertex without intravenous contrast. COMPARISON:  04/04/2013 head CT.  02/11/2014 face CT. FINDINGS: Brain: No intracranial hemorrhage or CT evidence of large acute infarct. Chronic microvascular changes. Moderate to marked global atrophy without hydrocephalus. No intracranial mass lesion noted on this unenhanced exam. Vascular: Vascular calcifications. Skull: No acute abnormality. Sinuses/Orbits: No acute orbital abnormality. Visualized paranasal sinuses and mastoid air cells are clear with the exception minimal mucosal thickening left sphenoid sinus. Other: Negative IMPRESSION: No acute intracranial abnormality. Marked to marked global atrophy without hydrocephalus. Chronic microvascular changes. Electronically Signed   By: Genia Del M.D.   On: 06/20/2016 13:43    Micro Results     Recent Results (from the past 240 hour(s))  Urine culture     Status: None   Collection Time: 06/20/16 11:58 AM  Result Value Ref Range Status   Specimen Description URINE, CATHETERIZED  Final   Special Requests NONE  Final   Culture NO GROWTH Performed at [facility]   Final   Report Status 06/22/2016 FINAL  Final    Today   Subjective    [name] today has no headache,no chest abdominal pain,no new weakness tingling or numbness, feels much better wants to go home today.     Objective   Blood pressure (!) 154/82, pulse 65, temperature 98.2 F (36.8 C), temperature source Oral, resp. rate 18, height 5\' 4"  (1.626 m), weight  64.9 kg (143 lb), SpO2 100 %.   Intake/Output Summary (Last 24 hours) at 06/22/16 0903 Last data filed at 06/21/16 1809  Gross per 24 hour  Intake  240 ml  Output                0 ml  Net              240 ml    Exam Awake , pleasantly confused, No new F.N deficits, Normal affect [place].AT,PERRAL Supple Neck,No JVD, No cervical lymphadenopathy appriciated.  Symmetrical Chest wall movement, Good air movement bilaterally, CTAB RRR,No Gallops,Rubs or new Murmurs, No Parasternal Heave +ve B.Sounds, Abd Soft, Non tender, No organomegaly appriciated, No rebound -guarding or rigidity. No Cyanosis, Clubbing or edema, No new Rash or bruise   Data Review   CBC w Diff: Lab Results  Component Value Date   WBC 9.1 06/20/2016   HGB 11.9 (L) 06/20/2016   HCT 38.4 06/20/2016   PLT 227 06/20/2016   LYMPHOPCT 14 06/20/2016   MONOPCT 8 06/20/2016   EOSPCT 2 06/20/2016   BASOPCT 0 06/20/2016    CMP: Lab Results  Component Value Date   NA 137 06/20/2016   K 4.4 06/20/2016   CL 104 06/20/2016   CO2 28 06/20/2016   BUN 17 06/20/2016   CREATININE 1.03 (H) 06/20/2016   PROT 7.8 06/20/2016   ALBUMIN 3.5 06/20/2016   BILITOT 0.3 06/20/2016   ALKPHOS 80 06/20/2016   AST 22 06/20/2016   ALT 17 06/20/2016  .   Total Time in preparing paper work, data evaluation and todays exam - 35 minutes  Thurnell Lose M.D on 06/22/2016 at 9:03 AM  [facility]   Office  [phone]

## 2016-06-22 NOTE — Discharge Instructions (Signed)
Follow with Primary MD [name], MD in 7 days   Get CBC, CMP, 2 view Chest X ray checked  by Primary MD or SNF MD in 5-7 days ( we routinely change or add medications that can affect your baseline labs and fluid status, therefore we recommend that you get the mentioned basic workup next visit with your PCP, your PCP may decide not to get them or add new tests based on their clinical decision)   Activity: As tolerated with Full fall precautions use walker/cane & assistance as needed   Disposition Home     Diet:   Soft with feeding assistance and aspiration precautions.  For Heart failure patients - Check your Weight same time everyday, if you gain over 2 pounds, or you develop in leg swelling, experience more shortness of breath or chest pain, call your Primary MD immediately. Follow Cardiac Low Salt Diet and 1.5 lit/day fluid restriction.   On your next visit with your primary care physician please Get Medicines reviewed and adjusted.   Please request your Prim.MD to go over all Hospital Tests and Procedure/Radiological results at the follow up, please get all Hospital records sent to your Prim MD by signing hospital release before you go home.   If you experience worsening of your admission symptoms, develop shortness of breath, life threatening emergency, suicidal or homicidal thoughts you must seek medical attention immediately by calling 911 or calling your MD immediately  if symptoms less severe.  You Must read complete instructions/literature along with all the possible adverse reactions/side effects for all the Medicines you take and that have been prescribed to you. Take any new Medicines after you have completely understood and accpet all the possible adverse reactions/side effects.   Do not drive, operate heavy machinery, perform activities at heights, swimming or participation in water activities or provide baby sitting services if your were admitted for syncope or siezures  until you have seen by Primary MD or a Neurologist and advised to do so again.  Do not drive when taking Pain medications.    Do not take more than prescribed Pain, Sleep and Anxiety Medications  Special Instructions: If you have smoked or chewed Tobacco  in the last 2 yrs please stop smoking, stop any regular Alcohol  and or any Recreational drug use.  Wear Seat belts while driving.   Please note  You were cared for by a hospitalist during your hospital stay. If you have any questions about your discharge medications or the care you received while you were in the hospital after you are discharged, you can call the unit and asked to speak with the hospitalist on call if the hospitalist that took care of you is not available. Once you are discharged, your primary care physician will handle any further medical issues. Please note that NO REFILLS for any discharge medications will be authorized once you are discharged, as it is imperative that you return to your primary care physician (or establish a relationship with a primary care physician if you do not have one) for your aftercare needs so that they can reassess your need for medications and monitor your lab values.

## 2016-06-28 DIAGNOSIS — I110 Hypertensive heart disease with heart failure: Secondary | ICD-10-CM | POA: Diagnosis not present

## 2016-06-28 DIAGNOSIS — I5033 Acute on chronic diastolic (congestive) heart failure: Secondary | ICD-10-CM | POA: Diagnosis not present

## 2016-06-28 DIAGNOSIS — Z7951 Long term (current) use of inhaled steroids: Secondary | ICD-10-CM | POA: Diagnosis not present

## 2016-06-28 DIAGNOSIS — Z7982 Long term (current) use of aspirin: Secondary | ICD-10-CM | POA: Diagnosis not present

## 2016-06-28 DIAGNOSIS — G20 Parkinson's disease: Secondary | ICD-10-CM | POA: Diagnosis not present

## 2016-06-28 DIAGNOSIS — G301 Alzheimer's disease with late onset: Secondary | ICD-10-CM | POA: Diagnosis not present

## 2016-06-28 DIAGNOSIS — Z8673 Personal history of transient ischemic attack (TIA), and cerebral infarction without residual deficits: Secondary | ICD-10-CM | POA: Diagnosis not present

## 2016-06-29 DIAGNOSIS — Z7982 Long term (current) use of aspirin: Secondary | ICD-10-CM | POA: Diagnosis not present

## 2016-06-29 DIAGNOSIS — G20 Parkinson's disease: Secondary | ICD-10-CM | POA: Diagnosis not present

## 2016-06-29 DIAGNOSIS — I5033 Acute on chronic diastolic (congestive) heart failure: Secondary | ICD-10-CM | POA: Diagnosis not present

## 2016-06-29 DIAGNOSIS — G301 Alzheimer's disease with late onset: Secondary | ICD-10-CM | POA: Diagnosis not present

## 2016-06-29 DIAGNOSIS — I110 Hypertensive heart disease with heart failure: Secondary | ICD-10-CM | POA: Diagnosis not present

## 2016-06-29 DIAGNOSIS — Z8673 Personal history of transient ischemic attack (TIA), and cerebral infarction without residual deficits: Secondary | ICD-10-CM | POA: Diagnosis not present

## 2016-06-29 DIAGNOSIS — Z7951 Long term (current) use of inhaled steroids: Secondary | ICD-10-CM | POA: Diagnosis not present

## 2016-07-03 DIAGNOSIS — I10 Essential (primary) hypertension: Secondary | ICD-10-CM | POA: Diagnosis not present

## 2016-07-03 DIAGNOSIS — S8011XA Contusion of right lower leg, initial encounter: Secondary | ICD-10-CM | POA: Diagnosis not present

## 2016-07-04 DIAGNOSIS — I110 Hypertensive heart disease with heart failure: Secondary | ICD-10-CM | POA: Diagnosis not present

## 2016-07-04 DIAGNOSIS — G20 Parkinson's disease: Secondary | ICD-10-CM | POA: Diagnosis not present

## 2016-07-04 DIAGNOSIS — Z7982 Long term (current) use of aspirin: Secondary | ICD-10-CM | POA: Diagnosis not present

## 2016-07-04 DIAGNOSIS — Z7951 Long term (current) use of inhaled steroids: Secondary | ICD-10-CM | POA: Diagnosis not present

## 2016-07-04 DIAGNOSIS — I5033 Acute on chronic diastolic (congestive) heart failure: Secondary | ICD-10-CM | POA: Diagnosis not present

## 2016-07-04 DIAGNOSIS — Z8673 Personal history of transient ischemic attack (TIA), and cerebral infarction without residual deficits: Secondary | ICD-10-CM | POA: Diagnosis not present

## 2016-07-04 DIAGNOSIS — G301 Alzheimer's disease with late onset: Secondary | ICD-10-CM | POA: Diagnosis not present

## 2016-07-06 DIAGNOSIS — Z7982 Long term (current) use of aspirin: Secondary | ICD-10-CM | POA: Diagnosis not present

## 2016-07-06 DIAGNOSIS — I110 Hypertensive heart disease with heart failure: Secondary | ICD-10-CM | POA: Diagnosis not present

## 2016-07-06 DIAGNOSIS — G301 Alzheimer's disease with late onset: Secondary | ICD-10-CM | POA: Diagnosis not present

## 2016-07-06 DIAGNOSIS — G20 Parkinson's disease: Secondary | ICD-10-CM | POA: Diagnosis not present

## 2016-07-06 DIAGNOSIS — Z7951 Long term (current) use of inhaled steroids: Secondary | ICD-10-CM | POA: Diagnosis not present

## 2016-07-06 DIAGNOSIS — I5033 Acute on chronic diastolic (congestive) heart failure: Secondary | ICD-10-CM | POA: Diagnosis not present

## 2016-07-06 DIAGNOSIS — Z8673 Personal history of transient ischemic attack (TIA), and cerebral infarction without residual deficits: Secondary | ICD-10-CM | POA: Diagnosis not present

## 2016-07-11 DIAGNOSIS — G20 Parkinson's disease: Secondary | ICD-10-CM | POA: Diagnosis not present

## 2016-07-11 DIAGNOSIS — I110 Hypertensive heart disease with heart failure: Secondary | ICD-10-CM | POA: Diagnosis not present

## 2016-07-11 DIAGNOSIS — G301 Alzheimer's disease with late onset: Secondary | ICD-10-CM | POA: Diagnosis not present

## 2016-07-11 DIAGNOSIS — I5033 Acute on chronic diastolic (congestive) heart failure: Secondary | ICD-10-CM | POA: Diagnosis not present

## 2016-07-11 DIAGNOSIS — Z7982 Long term (current) use of aspirin: Secondary | ICD-10-CM | POA: Diagnosis not present

## 2016-07-11 DIAGNOSIS — Z7951 Long term (current) use of inhaled steroids: Secondary | ICD-10-CM | POA: Diagnosis not present

## 2016-07-11 DIAGNOSIS — Z8673 Personal history of transient ischemic attack (TIA), and cerebral infarction without residual deficits: Secondary | ICD-10-CM | POA: Diagnosis not present

## 2016-07-21 DIAGNOSIS — G20 Parkinson's disease: Secondary | ICD-10-CM | POA: Diagnosis not present

## 2016-07-21 DIAGNOSIS — I110 Hypertensive heart disease with heart failure: Secondary | ICD-10-CM | POA: Diagnosis not present

## 2016-07-21 DIAGNOSIS — I5033 Acute on chronic diastolic (congestive) heart failure: Secondary | ICD-10-CM | POA: Diagnosis not present

## 2016-07-21 DIAGNOSIS — G301 Alzheimer's disease with late onset: Secondary | ICD-10-CM | POA: Diagnosis not present

## 2016-07-21 DIAGNOSIS — Z7982 Long term (current) use of aspirin: Secondary | ICD-10-CM | POA: Diagnosis not present

## 2016-07-21 DIAGNOSIS — Z8673 Personal history of transient ischemic attack (TIA), and cerebral infarction without residual deficits: Secondary | ICD-10-CM | POA: Diagnosis not present

## 2016-07-21 DIAGNOSIS — Z7951 Long term (current) use of inhaled steroids: Secondary | ICD-10-CM | POA: Diagnosis not present

## 2016-07-26 DIAGNOSIS — Z8673 Personal history of transient ischemic attack (TIA), and cerebral infarction without residual deficits: Secondary | ICD-10-CM | POA: Diagnosis not present

## 2016-07-26 DIAGNOSIS — I110 Hypertensive heart disease with heart failure: Secondary | ICD-10-CM | POA: Diagnosis not present

## 2016-07-26 DIAGNOSIS — I5033 Acute on chronic diastolic (congestive) heart failure: Secondary | ICD-10-CM | POA: Diagnosis not present

## 2016-07-26 DIAGNOSIS — G301 Alzheimer's disease with late onset: Secondary | ICD-10-CM | POA: Diagnosis not present

## 2016-07-26 DIAGNOSIS — G20 Parkinson's disease: Secondary | ICD-10-CM | POA: Diagnosis not present

## 2016-07-26 DIAGNOSIS — Z7951 Long term (current) use of inhaled steroids: Secondary | ICD-10-CM | POA: Diagnosis not present

## 2016-07-26 DIAGNOSIS — Z7982 Long term (current) use of aspirin: Secondary | ICD-10-CM | POA: Diagnosis not present

## 2016-09-02 ENCOUNTER — Emergency Department (HOSPITAL_COMMUNITY)
Admission: EM | Admit: 2016-09-02 | Discharge: 2016-09-02 | Disposition: A | Discharge status: Home / Self Care | Payer: Medicare Other | Attending: Emergency Medicine | Admitting: Emergency Medicine

## 2016-09-02 ENCOUNTER — Emergency Department (HOSPITAL_COMMUNITY): Payer: Medicare Other

## 2016-09-02 ENCOUNTER — Encounter (HOSPITAL_COMMUNITY): Payer: Self-pay | Admitting: Emergency Medicine

## 2016-09-02 DIAGNOSIS — W182XXA Fall in (into) shower or empty bathtub, initial encounter: Secondary | ICD-10-CM | POA: Diagnosis not present

## 2016-09-02 DIAGNOSIS — R531 Weakness: Secondary | ICD-10-CM | POA: Diagnosis not present

## 2016-09-02 DIAGNOSIS — Y999 Unspecified external cause status: Secondary | ICD-10-CM | POA: Diagnosis not present

## 2016-09-02 DIAGNOSIS — T148XXA Other injury of unspecified body region, initial encounter: Secondary | ICD-10-CM | POA: Diagnosis not present

## 2016-09-02 DIAGNOSIS — Z23 Encounter for immunization: Secondary | ICD-10-CM | POA: Insufficient documentation

## 2016-09-02 DIAGNOSIS — S39022A Laceration of muscle, fascia and tendon of lower back, initial encounter: Secondary | ICD-10-CM | POA: Diagnosis not present

## 2016-09-02 DIAGNOSIS — Y9389 Activity, other specified: Secondary | ICD-10-CM | POA: Diagnosis not present

## 2016-09-02 DIAGNOSIS — S31010A Laceration without foreign body of lower back and pelvis without penetration into retroperitoneum, initial encounter: Secondary | ICD-10-CM | POA: Diagnosis not present

## 2016-09-02 DIAGNOSIS — Z7982 Long term (current) use of aspirin: Secondary | ICD-10-CM | POA: Diagnosis not present

## 2016-09-02 DIAGNOSIS — Y929 Unspecified place or not applicable: Secondary | ICD-10-CM | POA: Insufficient documentation

## 2016-09-02 DIAGNOSIS — G309 Alzheimer's disease, unspecified: Secondary | ICD-10-CM | POA: Diagnosis not present

## 2016-09-02 DIAGNOSIS — S3992XA Unspecified injury of lower back, initial encounter: Secondary | ICD-10-CM | POA: Diagnosis not present

## 2016-09-02 DIAGNOSIS — S31000A Unspecified open wound of lower back and pelvis without penetration into retroperitoneum, initial encounter: Secondary | ICD-10-CM | POA: Diagnosis not present

## 2016-09-02 DIAGNOSIS — G20 Parkinson's disease: Secondary | ICD-10-CM | POA: Diagnosis not present

## 2016-09-02 DIAGNOSIS — Z79899 Other long term (current) drug therapy: Secondary | ICD-10-CM | POA: Insufficient documentation

## 2016-09-02 DIAGNOSIS — S3663XA Laceration of rectum, initial encounter: Secondary | ICD-10-CM | POA: Diagnosis not present

## 2016-09-02 DIAGNOSIS — I110 Hypertensive heart disease with heart failure: Secondary | ICD-10-CM | POA: Insufficient documentation

## 2016-09-02 DIAGNOSIS — I5033 Acute on chronic diastolic (congestive) heart failure: Secondary | ICD-10-CM | POA: Insufficient documentation

## 2016-09-02 DIAGNOSIS — S31829A Unspecified open wound of left buttock, initial encounter: Secondary | ICD-10-CM | POA: Diagnosis not present

## 2016-09-02 HISTORY — DX: Heart failure, unspecified: I50.9

## 2016-09-02 LAB — CBC WITH DIFFERENTIAL/PLATELET
Basophils Absolute: 0 10*3/uL (ref 0.0–0.1)
Basophils Relative: 0 %
Eosinophils Absolute: 0.1 10*3/uL (ref 0.0–0.7)
Eosinophils Relative: 1 %
HCT: 35.6 % — ABNORMAL LOW (ref 36.0–46.0)
Hemoglobin: 11 g/dL — ABNORMAL LOW (ref 12.0–15.0)
Lymphocytes Relative: 16 %
Lymphs Abs: 1.4 10*3/uL (ref 0.7–4.0)
MCH: 23.4 pg — ABNORMAL LOW (ref 26.0–34.0)
MCHC: 30.9 g/dL (ref 30.0–36.0)
MCV: 75.6 fL — ABNORMAL LOW (ref 78.0–100.0)
Monocytes Absolute: 0.4 10*3/uL (ref 0.1–1.0)
Monocytes Relative: 4 %
Neutro Abs: 6.7 10*3/uL (ref 1.7–7.7)
Neutrophils Relative %: 79 %
Platelets: 236 10*3/uL (ref 150–400)
RBC: 4.71 MIL/uL (ref 3.87–5.11)
RDW: 16.7 % — ABNORMAL HIGH (ref 11.5–15.5)
WBC: 8.5 10*3/uL (ref 4.0–10.5)

## 2016-09-02 LAB — COMPREHENSIVE METABOLIC PANEL
ALT: 13 U/L — ABNORMAL LOW (ref 14–54)
AST: 22 U/L (ref 15–41)
Albumin: 3.6 g/dL (ref 3.5–5.0)
Alkaline Phosphatase: 76 U/L (ref 38–126)
Anion gap: 5 (ref 5–15)
BUN: 15 mg/dL (ref 6–20)
CO2: 28 mmol/L (ref 22–32)
Calcium: 8.7 mg/dL — ABNORMAL LOW (ref 8.9–10.3)
Chloride: 108 mmol/L (ref 101–111)
Creatinine, Ser: 0.95 mg/dL (ref 0.44–1.00)
GFR calc Af Amer: 58 mL/min — ABNORMAL LOW (ref >60)
GFR calc non Af Amer: 50 mL/min — ABNORMAL LOW (ref >60)
Glucose, Bld: 101 mg/dL — ABNORMAL HIGH (ref 65–99)
Potassium: 5.2 mmol/L — ABNORMAL HIGH (ref 3.5–5.1)
Sodium: 141 mmol/L (ref 135–145)
Total Bilirubin: 0.4 mg/dL (ref 0.3–1.2)
Total Protein: 7.5 g/dL (ref 6.5–8.1)

## 2016-09-02 MED ORDER — HYDROCODONE-ACETAMINOPHEN 5-325 MG PO TABS: 2.0000 | ORAL_TABLET | ORAL | 0 refills | Status: DC | PRN

## 2016-09-02 MED ORDER — CEPHALEXIN 500 MG PO CAPS: 500.0000 mg | ORAL_CAPSULE | Freq: Four times a day (QID) | ORAL | 0 refills | Status: DC

## 2016-09-02 MED ORDER — LIDOCAINE HCL (PF) 2 % IJ SOLN
10.0000 mL | Freq: Once | INTRAMUSCULAR | Status: AC
  Administered 2016-09-02: 10 mL
  Filled 2016-09-02: qty 10

## 2016-09-02 MED ORDER — ONDANSETRON HCL 4 MG/2ML IJ SOLN
4.0000 mg | Freq: Once | INTRAMUSCULAR | Status: AC
  Administered 2016-09-02: 4 mg via INTRAVENOUS
  Filled 2016-09-02: qty 2

## 2016-09-02 MED ORDER — IOPAMIDOL (ISOVUE-300) INJECTION 61%
100.0000 mL | Freq: Once | INTRAVENOUS | Status: AC | PRN
Start: 2016-09-02 — End: 2016-09-02
  Administered 2016-09-02: 100 mL via INTRAVENOUS

## 2016-09-02 MED ORDER — MORPHINE SULFATE (PF) 4 MG/ML IV SOLN
4.0000 mg | Freq: Once | INTRAVENOUS | Status: AC
Start: 2016-09-02 — End: 2016-09-02
  Administered 2016-09-02: 4 mg via INTRAVENOUS
  Filled 2016-09-02: qty 1

## 2016-09-02 MED ORDER — TETANUS-DIPHTH-ACELL PERTUSSIS 5-2.5-18.5 LF-MCG/0.5 IM SUSP
0.5000 mL | Freq: Once | INTRAMUSCULAR | Status: AC
  Administered 2016-09-02: 0.5 mL via INTRAMUSCULAR
  Filled 2016-09-02: qty 0.5

## 2016-09-02 MED ORDER — DEXTROSE 5 % IV SOLN
1.0000 g | Freq: Once | INTRAVENOUS | Status: AC
  Administered 2016-09-02: 1 g via INTRAVENOUS
  Filled 2016-09-02: qty 10

## 2016-09-02 NOTE — ED Notes (Signed)
Unable to obtain labs with IV stick. EDP aware.

## 2016-09-02 NOTE — ED Notes (Signed)
Attempted IV access x 1. Pt confused and began yelling. Family at bedside getting angry at staff for "hurting" patient. Explained to family that IV access was needed for her care and to give her pain medication. Agricultural consultant and EDP notified.

## 2016-09-02 NOTE — ED Notes (Signed)
RT and lab at bedside.

## 2016-09-02 NOTE — ED Triage Notes (Signed)
Pt brought in by ems for fall in bathtub.  Pt sat down on the bathtub spout and laceration occurred to buttocks/rectal area.

## 2016-09-02 NOTE — ED Provider Notes (Signed)
[facility] DEPT Provider Note   CSN: [id number] Arrival date & time: 09/02/16  1006     History   Chief Complaint Chief Complaint  Patient presents with  . Laceration  . Fall    HPI [name] is a 82 y.o. female.  The history is provided by the EMS personnel. No language interpreter was used.  Laceration   The incident occurred less than 1 hour ago. The laceration is located on the back. The laceration is 7 cm in size. The laceration mechanism was a a blunt object. The pain is moderate. The pain has been constant since onset. She reports no foreign bodies present.  Fall   Pt slipped and fell in the tuib  and slid down hitting the handle of tub drain  and tearing skin on her bottom.  No impact of head no loss of consciousness.  EMS reports bleeding controlled with pressure.   Pt lives with family.   Past Medical History:  Diagnosis Date  . Alzheimer's dementia   . Cancer (HCC)    breast  . CHF (congestive heart failure) ([place])   . Heart attack   . Hypertension   . Parkinson disease [facility])     Patient Active Problem List   Diagnosis Date Noted  . Dyspnea 01/06/2015  . Essential hypertension 01/06/2015  . Depression with anxiety 01/06/2015  . Insomnia 01/06/2015  . Acute on chronic diastolic congestive heart failure ([place])   . Pneumonia 09/24/2013  . Enteritis due to Clostridium difficile 09/23/2013  . SBO (small bowel obstruction) 09/17/2013  . Lesion of liver 09/17/2013  . Microcytic anemia 04/05/2013  . Elevated troponin 04/04/2013  . UTI (urinary tract infection) 04/04/2013  . Dehydration 04/04/2013  . Altered mental status 04/04/2013  . Closed right hip fracture ([place]) 01/31/2013  . Fall 01/31/2013  . Dementia with behavioral disturbance 01/31/2013  . Parkinson disease ([place]) 01/31/2013  . Community acquired pneumonia 06/30/2012  . Weakness generalized 06/30/2012  . Dementia 06/30/2012  . Proximal phalanx fracture of finger 08/15/2011    Past  Surgical History:  Procedure Laterality Date  . ABDOMINAL HYSTERECTOMY    . arm surgery    . BREAST SURGERY    . CHOLECYSTECTOMY    . COLON SURGERY    . HIP PINNING,CANNULATED Right 02/01/2013   Procedure: CANNULATED HIP PINNING;  Surgeon: Sanjuana Kava, MD;  Location: [facility] ORS;  Service: Orthopedics;  Laterality: Right;  . LEG SURGERY    . MASTECTOMY    . PELVIC FRACTURE SURGERY    . small intestine removed    . TONSILLECTOMY      OB History    No data available       Home Medications    Prior to Admission medications   Medication Sig Start Date End Date Taking? Authorizing Provider  acetaminophen (TYLENOL) 500 MG tablet Take 500 mg by mouth at bedtime. For pain    Historical Provider, MD  albuterol (PROVENTIL) (2.5 MG/3ML) 0.083% nebulizer solution Take 2.5 mg by nebulization every 6 (six) hours as needed for wheezing or shortness of breath.    Historical Provider, MD  ALPRAZolam Duanne Moron) 0.5 MG tablet Take 0.5 mg by mouth 3 (three) times daily.     Historical Provider, MD  aspirin EC 81 MG tablet Take 1 tablet (81 mg total) by mouth daily. 04/05/13   Kathie Dike, MD  benztropine (COGENTIN) 1 MG tablet Take 1 tablet (1 mg total) by mouth daily. 04/05/13   Kathie Dike, MD  citalopram (CELEXA) 20 MG tablet Take 1 tablet (20 mg total) by mouth every morning. 04/05/13   Kathie Dike, MD  haloperidol (HALDOL) 2 MG/ML solution Take 1 mg by mouth 2 (two) times daily as needed for agitation.    Historical Provider, MD  metoprolol (LOPRESSOR) 50 MG tablet Take 0.5 tablets (25 mg total) by mouth 2 (two) times daily. 09/24/13   Kathie Dike, MD  rivastigmine (EXELON) 9.5 mg/24hr Place 1 patch (9.5 mg total) onto the skin daily. *Remove and discard used patches* 04/05/13   Kathie Dike, MD  zolpidem (AMBIEN) 10 MG tablet Take 10 mg by mouth at bedtime as needed for sleep.    Historical Provider, MD    Family History Family History  Problem Relation Age of Onset  . Aneurysm Mother     . Diabetes Father     Social History Social History  Substance Use Topics  . Smoking status: Never Smoker  . Smokeless tobacco: Never Used  . Alcohol use No     Allergies   Adhesive [tape]; Ensure pudding [nutritional supplements]; and Penicillins   Review of Systems Review of Systems  All other systems reviewed and are negative.    Physical Exam Updated Vital Signs BP (!) 157/118 (BP Location: Right Arm)   Pulse 65   Temp 97.8 F (36.6 C) (Axillary)   Resp 24   Ht 5\' 7"  (1.702 m)   Wt 90.7 kg   SpO2 92%   BMI 31.32 kg/m   Physical Exam  Constitutional: She appears well-developed and well-nourished.  HENT:  Head: Normocephalic and atraumatic.  Right Ear: External ear normal.  Left Ear: External ear normal.  Musculoskeletal: Normal range of motion.  Neurological: She is alert.  Skin:  7cm gapping laceration between coccyx area and anus (perirectal area)   Psychiatric: She has a normal mood and affect.  Nursing note and vitals reviewed.    ED Treatments / Results  Labs (all labs ordered are listed, but only abnormal results are displayed) Labs Reviewed - No data to display  EKG  EKG Interpretation None       Radiology Ct Pelvis W Contrast  Result Date: 09/02/2016 CLINICAL DATA:  Laceration in the buttock/rectal area following a fall in the bathtub today. EXAM: CT PELVIS WITH CONTRAST TECHNIQUE: Multidetector CT imaging of the pelvis was performed using the standard protocol following the bolus administration of intravenous contrast. CONTRAST:  119mL ISOVUE-300 IOPAMIDOL (ISOVUE-300) INJECTION 61% COMPARISON:  Single-view abdomen dated 09/24/2013. Abdomen and pelvis CT dated 09/17/2013.) FINDINGS: Urinary Tract: Small amount of excreted contrast in the dependent portion of the urinary bladder. No visible ureteral abnormalities. Bowel: Multiple sigmoid colon diverticula. There is also a fat density mass compatible with a lipoma in the proximal sigmoid  colon, measuring 2.5 x 1.9 cm on coronal image number 38. Vascular/Lymphatic: Dense atheromatous arterial calcifications without aneurysm. No enlarged lymph nodes. Reproductive:  Surgically absent uterus.  No adnexal mass. Other: Multiple loculations of air in the subcutaneous fat posterior and inferior to the coccyx and and extending anteriorly to the posterior aspect of the peritoneal reflection posterior to the rectum. No intraperitoneal air seen. Midline surgical scar. Stable right abdominal surgical clips and colonic anastomosis. Musculoskeletal: Diffuse osteopenia. Old, healed left superior and inferior pubic ramus fractures. 3 fixation screws in the proximal right femur. No acute fracture or dislocation. Right hip and lower lumbar spine degenerative changes. Symphysis pubis does degenerative changes. IMPRESSION: 1. Soft tissue air posterior and inferior to the coccyx  and distal sacrum and extending posterior to the peritoneal reflections posterior to the rectum, without intraperitoneal air seen. 2. No acute fracture seen. 3. Colonic diverticulosis. 4. Proximal sigmoid colon lipoma filling the lumen of the colon without obstruction. Electronically Signed   By: Claudie Revering M.D.   On: 09/02/2016 15:55    Procedures .Marland KitchenLaceration Repair Date/Time: 09/02/2016 4:52 PM Performed by: Fransico Meadow Authorized by: Fransico Meadow   Consent:    Consent obtained:  Verbal   Consent given by:  Guardian   Risks discussed:  Infection and poor wound healing   Alternatives discussed:  Delayed treatment Anesthesia (see MAR for exact dosages):    Anesthesia method:  Local infiltration   Local anesthetic:  Lidocaine 2% w/o epi Laceration details:    Location:  Anogenital   Anogenital location: low back.   Length (cm):  6   Depth (mm):  80 Repair type:    Repair type:  Intermediate Pre-procedure details:    Preparation:  Patient was prepped and draped in usual sterile fashion and imaging obtained to  evaluate for foreign bodies Exploration:    Wound extent: no foreign bodies/material noted and no vascular damage noted     Contaminated: no   Treatment:    Amount of cleaning:  Standard   Irrigation solution:  Sterile saline   Irrigation volume:  500   Irrigation method:  Syringe Subcutaneous repair:    Suture size:  3-0   Suture technique:  Simple interrupted   Number of sutures:  3 Skin repair:    Repair method:  Sutures   Suture size:  4-0   Suture material:  Prolene   Suture technique:  Simple interrupted   Number of sutures:  9 Approximation:    Approximation:  Loose Post-procedure details:    Dressing:  Non-adherent dressing   Patient tolerance of procedure:  Tolerated well, no immediate complications   (including critical care time)  Medications Ordered in ED Medications - No data to display   Initial Impression / Assessment and Plan / ED Course  I have reviewed the triage vital signs and the nursing notes.  Pertinent labs & imaging results that were available during my care of the patient were reviewed by me and considered in my medical decision making (see chart for details).  Clinical Course    Dr. Rosana Hoes in to see and examine pt.   He advised close skin.  He feels pt is less likely to develop ulcer/obtain infection if wound is closed. He advised rocephin IV.    Final Clinical Impressions(s) / ED Diagnoses   Final diagnoses:  Laceration of muscle, fascia and tendon of lower back, initial encounter    New Prescriptions Discharge Medication List as of 09/02/2016  4:08 PM    START taking these medications   Details  cephALEXin (KEFLEX) 500 MG capsule Take 1 capsule (500 mg total) by mouth 4 (four) times daily., Starting Sat 09/02/2016, Print    HYDROcodone-acetaminophen (NORCO/VICODIN) 5-325 MG tablet Take 2 tablets by mouth every 4 (four) hours as needed., Starting Sat 09/02/2016, Print      Pt to return in 2 days for wound check due to risk of  infection/complicataion.  Suture removal in 8 days.    Hollace Kinnier Dinuba, PA-C 09/02/16 [place], MD 09/04/16 [phone]

## 2016-09-02 NOTE — ED Notes (Signed)
RT paged for arterial stick per EDP.

## 2016-09-02 NOTE — Consult Note (Signed)
SURGICAL CONSULTATION NOTE (initial) - cpt: [phone] (Outpatient/ED)  HISTORY OF PRESENT ILLNESS (HPI):  82 y.o. female presented to [facility] ED after she slowly slid down the wall of her shower against which she was leaning while transferring onto her shower bench with her daughter's assistance this morning. Patient is unable to provide any history, so the history is obtained for patient's daughter and sister at patient's bedside. Both patient's daughter and sister deny patient falling abruptly and deny her hitting her head or otherwise during the episode. However, they were not able to prevent her from sliding across the metal pin/control that switches from bath to shower while she slid. After helping the patient up with the assistance of an aide, family noted a lower back wound caused by the shower switch over which patient slid and brought her to [facility] ED for evaluation accordingly. Patient has not complained of or indicated pain since the event occurred. Patient's daughter and sister enthusiastically insist that patient's backside is always kept very clean, and she has strong family support.  Surgery is consulted by ED physician Dr. Sherry Ruffing in this context for evaluation of patient's traumatic lower back/peri-anal wound.  PAST MEDICAL HISTORY (PMH):  Past Medical History:  Diagnosis Date  . Alzheimer's dementia   . Cancer (HCC)    breast  . CHF (congestive heart failure) ([place])   . Heart attack   . Hypertension   . Parkinson disease ([place])      PAST SURGICAL HISTORY ([place]):  Past Surgical History:  Procedure Laterality Date  . ABDOMINAL HYSTERECTOMY    . arm surgery    . BREAST SURGERY    . CHOLECYSTECTOMY    . COLON SURGERY    . HIP PINNING,CANNULATED Right 02/01/2013   Procedure: CANNULATED HIP PINNING;  Surgeon: Sanjuana Kava, MD;  Location: [facility] ORS;  Service: Orthopedics;  Laterality: Right;  . LEG SURGERY    . MASTECTOMY    . PELVIC FRACTURE SURGERY    . small intestine removed    .  TONSILLECTOMY       MEDICATIONS:  Prior to Admission medications   Medication Sig Start Date End Date Taking? Authorizing Provider  acetaminophen (TYLENOL) 500 MG tablet Take 500 mg by mouth at bedtime. For pain   Yes Historical Provider, MD  albuterol (PROVENTIL) (2.5 MG/3ML) 0.083% nebulizer solution Take 2.5 mg by nebulization every 6 (six) hours as needed for wheezing or shortness of breath.   Yes Historical Provider, MD  ALPRAZolam Duanne Moron) 0.5 MG tablet Take 0.5 mg by mouth 3 (three) times daily.    Yes Historical Provider, MD  aspirin EC 81 MG tablet Take 1 tablet (81 mg total) by mouth daily. 04/05/13  Yes Kathie Dike, MD  benztropine (COGENTIN) 1 MG tablet Take 1 tablet (1 mg total) by mouth daily. 04/05/13  Yes Kathie Dike, MD  citalopram (CELEXA) 20 MG tablet Take 1 tablet (20 mg total) by mouth every morning. 04/05/13  Yes Kathie Dike, MD  metoprolol (LOPRESSOR) 50 MG tablet Take 0.5 tablets (25 mg total) by mouth 2 (two) times daily. 09/24/13  Yes Kathie Dike, MD  rivastigmine (EXELON) 9.5 mg/24hr Place 1 patch (9.5 mg total) onto the skin daily. *Remove and discard used patches* 04/05/13  Yes Kathie Dike, MD  zolpidem (AMBIEN) 10 MG tablet Take 10 mg by mouth at bedtime as needed for sleep.   Yes Historical Provider, MD     ALLERGIES:  Allergies  Allergen Reactions  . Adhesive [Tape] Rash  . Ensure  Pudding [Nutritional Supplements] Swelling and Rash    Throat swelling  . Penicillins Swelling and Rash    Has patient had a PCN reaction causing immediate rash, facial/tongue/throat swelling, SOB or lightheadedness with hypotension: Yes Has patient had a PCN reaction causing severe rash involving mucus membranes or skin necrosis: Yes Has patient had a PCN reaction that required hospitalization No Has patient had a PCN reaction occurring within the last 10 years: No If all of the above answers are "NO", then may proceed with Cephalosporin use.     SOCIAL HISTORY:   Social History   Social History  . Marital status: Widowed    Spouse name: N/A  . Number of children: N/A  . Years of education: N/A   Occupational History  . Not on file.   Social History Main Topics  . Smoking status: Never Smoker  . Smokeless tobacco: Never Used  . Alcohol use No  . Drug use: No  . Sexual activity: No   Other Topics Concern  . Not on file   Social History Narrative  . No narrative on file    The patient currently resides (home / rehab facility / nursing home): Home with family  The patient normally is (ambulatory / bedbound): Limited ambulation with full assistance   FAMILY HISTORY:  Family History  Problem Relation Age of Onset  . Aneurysm Mother   . Diabetes Father     REVIEW OF SYSTEMS - substantially limited due to patient's overall cognitive status Respiratory: does not indicate shortness of breath  Cardiovascular: does not indicate chest pain or palpitations  Gastrointestinal: does not indicate abdominal pain, N/V, or diarrhea/and bowel function as per HPI Neurological: does not indicate any headache, dizziness, weakness  Musculoskeletal: does not indicate any other joint pains or cramps  Skin: lower back/peri-anal wound as per HPI  All other review of systems were negative   VITAL SIGNS:  Temp:  [97.8 F (36.6 C)] 97.8 F (36.6 C) (12/16 1021) Pulse Rate:  [59-65] 59 (12/16 1200) Resp:  [16-24] 16 (12/16 1200) BP: (95-157)/(53-118) 95/53 (12/16 1200) SpO2:  [91 %-95 %] 91 % (12/16 1200) Weight:  [90.7 kg (200 lb)] 90.7 kg (200 lb) (12/16 1019)     Height: 5\' 7"  (170.2 cm) Weight: 90.7 kg (200 lb) BMI (Calculated): 31.4   INTAKE/OUTPUT:  This shift: No intake/output data recorded.  Last 2 shifts: @IOLAST2SHIFTS @   PHYSICAL EXAM:  Constitutional:  -- Overweight body habitus  -- Makes eye contact to verbal and physical stimuli, but not much purposeful interaction otherwise  Eyes:  -- Pupils equally round and reactive to light   -- No scleral icterus  Ear, nose, and throat:  -- No jugular venous distension  Pulmonary:  -- No crackles  -- Equal breath sounds bilaterally -- Breathing non-labored at rest Cardiovascular:  -- S1, S2 present  -- No pericardial rubs Gastrointestinal:  -- Abdomen soft, nontender, nondistended, no guarding/rebound  -- No abdominal masses appreciated, pulsatile or otherwise Anorectal: -- No evidence of mucosal disruption or lesions/wounds, appropriate anal sphincter tone, no gross blood Musculoskeletal and Integumentary:  -- Wounds or skin discoloration: 3 cm long x 2-3 cm deep (through subcutaneous fat) minimally tender somewhat irregular vertical laceration immediately superior to anus with no evidence of erythema or contamination, minimal venous bleeding from raw surface -- Extremities: B/L UE and LE FROM, hands and feet warm Neurologic:  -- Motor function: intact and symmetric -- Sensation: unable to assess  Labs:  CBC:  Lab Results  Component Value Date   WBC 8.5 09/02/2016   RBC 4.71 09/02/2016   BMP:  Lab Results  Component Value Date   GLUCOSE 101 (H) 09/02/2016   CO2 28 09/02/2016   BUN 15 09/02/2016   CREATININE 0.95 09/02/2016   CALCIUM 8.7 (L) 09/02/2016   Imaging studies:  No pertinent imaging studies available at time of evaluation  Assessment/Plan: (ICD-10's: S31.000A) 82 y.o. female with penetrating traumatic wound of lower back without evidence of retained foreign body and without evidence of contamination, complicated by pertinent comorbidities including severe chronic dementia (both Alzheimer's and Parkinson's reported), advanced age, obesity (BMI 31.4), CHF, CAD s/p MI, HTN, and history of breast cancer.   - CT imaging not indicated considering mechanism (slowly sliding without abrupt fall/impact)  - risks and benefits of wound closure following copious irrigation and administration of antibiotics vs daily open wound packing were discussed with  patient's family and medical team   - okay to loosely re-approximate wound edges after copious irrigation and administration of antibiotics to facilitate wound care  - family assures they will continue to keep site clean and closely monitor for signs/symptoms of infection as discussed  - outpatient follow-up with either PMD or surgical follow-up are appropriate  - please call if any questions or concerns arise  All of the above findings and recommendations were discussed with the patient and her family as well as with the ED PA and MD, and all of patient's family's questions were answered to their expressed satisfaction.  Thank you for the opportunity to participate in this patient's care.   -- Marilynne Drivers Rosana Hoes, MD, [place]: [facility] Office: [phone]

## 2016-09-04 ENCOUNTER — Encounter (HOSPITAL_COMMUNITY): Payer: Self-pay | Admitting: Emergency Medicine

## 2016-09-04 ENCOUNTER — Emergency Department (HOSPITAL_COMMUNITY)
Admission: EM | Admit: 2016-09-04 | Discharge: 2016-09-04 | Disposition: A | Discharge status: Home / Self Care | Payer: Medicare Other | Attending: Emergency Medicine | Admitting: Emergency Medicine

## 2016-09-04 DIAGNOSIS — Z7982 Long term (current) use of aspirin: Secondary | ICD-10-CM | POA: Insufficient documentation

## 2016-09-04 DIAGNOSIS — Z4802 Encounter for removal of sutures: Secondary | ICD-10-CM | POA: Insufficient documentation

## 2016-09-04 DIAGNOSIS — I110 Hypertensive heart disease with heart failure: Secondary | ICD-10-CM | POA: Insufficient documentation

## 2016-09-04 DIAGNOSIS — G3183 Dementia with Lewy bodies: Secondary | ICD-10-CM | POA: Insufficient documentation

## 2016-09-04 DIAGNOSIS — G309 Alzheimer's disease, unspecified: Secondary | ICD-10-CM | POA: Diagnosis not present

## 2016-09-04 DIAGNOSIS — I5033 Acute on chronic diastolic (congestive) heart failure: Secondary | ICD-10-CM | POA: Insufficient documentation

## 2016-09-04 DIAGNOSIS — Z79899 Other long term (current) drug therapy: Secondary | ICD-10-CM | POA: Insufficient documentation

## 2016-09-04 DIAGNOSIS — Z5189 Encounter for other specified aftercare: Secondary | ICD-10-CM

## 2016-09-04 MED ORDER — HYDROCODONE-ACETAMINOPHEN 5-325 MG PO TABS
2.0000 | ORAL_TABLET | Freq: Once | ORAL | Status: AC
  Administered 2016-09-04: 2 via ORAL
  Filled 2016-09-04: qty 2

## 2016-09-04 NOTE — ED Provider Notes (Signed)
[facility] DEPT Provider Note   CSN: [id number] Arrival date & time: 09/04/16  1056  By signing my name below, I, Judithe Modest, attest that this documentation has been prepared under the direction and in the presence of Elnora Morrison, MD. Electronically Signed: Judithe Modest, ER Scribe. 04/29/2016. 11:51 AM.   History   Chief Complaint Chief Complaint  Patient presents with  . Suture / Staple Removal   LEVEL FIVE CAVEAT FOR DEMENTIA.  HPI HPI Comments: [name] is a 82 y.o. female who presents to the Emergency Department reporting for a wound check. She has no PMHx of DM or vascular disease. Her attendants endorse fever, chills, puss draining from the wound. She has a PMHx of parkinson's, dementia.    Past Medical History:  Diagnosis Date  . Alzheimer's dementia   . Cancer (HCC)    breast  . CHF (congestive heart failure) ([place])   . Heart attack   . Hypertension   . Parkinson disease [facility])     Patient Active Problem List   Diagnosis Date Noted  . Dyspnea 01/06/2015  . Essential hypertension 01/06/2015  . Depression with anxiety 01/06/2015  . Insomnia 01/06/2015  . Acute on chronic diastolic congestive heart failure ([place])   . Pneumonia 09/24/2013  . Enteritis due to Clostridium difficile 09/23/2013  . SBO (small bowel obstruction) 09/17/2013  . Lesion of liver 09/17/2013  . Microcytic anemia 04/05/2013  . Elevated troponin 04/04/2013  . UTI (urinary tract infection) 04/04/2013  . Dehydration 04/04/2013  . Altered mental status 04/04/2013  . Closed right hip fracture ([place]) 01/31/2013  . Fall 01/31/2013  . Dementia with behavioral disturbance 01/31/2013  . Parkinson disease ([place]) 01/31/2013  . Community acquired pneumonia 06/30/2012  . Weakness generalized 06/30/2012  . Dementia 06/30/2012  . Proximal phalanx fracture of finger 08/15/2011    Past Surgical History:  Procedure Laterality Date  . ABDOMINAL HYSTERECTOMY    . arm surgery    .  BREAST SURGERY    . CHOLECYSTECTOMY    . COLON SURGERY    . HIP PINNING,CANNULATED Right 02/01/2013   Procedure: CANNULATED HIP PINNING;  Surgeon: Sanjuana Kava, MD;  Location: [facility] ORS;  Service: Orthopedics;  Laterality: Right;  . LEG SURGERY    . MASTECTOMY    . PELVIC FRACTURE SURGERY    . small intestine removed    . TONSILLECTOMY      OB History    No data available       Home Medications    Prior to Admission medications   Medication Sig Start Date End Date Taking? Authorizing Provider  acetaminophen (TYLENOL) 500 MG tablet Take 500 mg by mouth at bedtime. For pain    Historical Provider, MD  albuterol (PROVENTIL) (2.5 MG/3ML) 0.083% nebulizer solution Take 2.5 mg by nebulization every 6 (six) hours as needed for wheezing or shortness of breath.    Historical Provider, MD  ALPRAZolam Duanne Moron) 0.5 MG tablet Take 0.5 mg by mouth 3 (three) times daily.     Historical Provider, MD  aspirin EC 81 MG tablet Take 1 tablet (81 mg total) by mouth daily. 04/05/13   Kathie Dike, MD  benztropine (COGENTIN) 1 MG tablet Take 1 tablet (1 mg total) by mouth daily. 04/05/13   Kathie Dike, MD  cephALEXin (KEFLEX) 500 MG capsule Take 1 capsule (500 mg total) by mouth 4 (four) times daily. 09/02/16   Fransico Meadow, PA-C  citalopram (CELEXA) 20 MG tablet Take 1 tablet (20  mg total) by mouth every morning. 04/05/13   Kathie Dike, MD  HYDROcodone-acetaminophen (NORCO/VICODIN) 5-325 MG tablet Take 2 tablets by mouth every 4 (four) hours as needed. 09/02/16   Fransico Meadow, PA-C  metoprolol (LOPRESSOR) 50 MG tablet Take 0.5 tablets (25 mg total) by mouth 2 (two) times daily. 09/24/13   Kathie Dike, MD  rivastigmine (EXELON) 9.5 mg/24hr Place 1 patch (9.5 mg total) onto the skin daily. *Remove and discard used patches* 04/05/13   Kathie Dike, MD  zolpidem (AMBIEN) 10 MG tablet Take 10 mg by mouth at bedtime as needed for sleep.    Historical Provider, MD    Family History Family History    Problem Relation Age of Onset  . Aneurysm Mother   . Diabetes Father     Social History Social History  Substance Use Topics  . Smoking status: Never Smoker  . Smokeless tobacco: Never Used  . Alcohol use No     Allergies   Adhesive [tape]; Ensure pudding [nutritional supplements]; and Penicillins   Review of Systems Review of Systems  Unable to perform ROS: Dementia     Physical Exam Updated Vital Signs BP 123/61   Pulse (!) 57   Temp 100.2 F (37.9 C) (Temporal)   Resp 20   Ht 5\' 2"  (1.575 m)   Wt 146 lb (66.2 kg)   BMI 26.70 kg/m   Physical Exam  Constitutional: She is oriented to person, place, and time. She appears well-developed and well-nourished. No distress.  HENT:  Head: Normocephalic and atraumatic.  Eyes: Pupils are equal, round, and reactive to light.  Neck: Neck supple.  Cardiovascular: Normal rate.   Pulmonary/Chest: Effort normal. No respiratory distress.  Musculoskeletal: Normal range of motion.  Neurological: She is alert and oriented to person, place, and time. Coordination normal.  Skin: Skin is warm and dry. She is not diaphoretic.  Approximately 3 cm healing laceration with blue sutures in place no surrounding induration, erythema, or puss draining.  Psychiatric: She has a normal mood and affect. Her behavior is normal.  Nursing note and vitals reviewed.    ED Treatments / Results  Labs (all labs ordered are listed, but only abnormal results are displayed) Labs Reviewed - No data to display  EKG  EKG Interpretation None       Radiology Ct Pelvis W Contrast  Result Date: 09/02/2016 CLINICAL DATA:  Laceration in the buttock/rectal area following a fall in the bathtub today. EXAM: CT PELVIS WITH CONTRAST TECHNIQUE: Multidetector CT imaging of the pelvis was performed using the standard protocol following the bolus administration of intravenous contrast. CONTRAST:  146mL ISOVUE-300 IOPAMIDOL (ISOVUE-300) INJECTION 61%  COMPARISON:  Single-view abdomen dated 09/24/2013. Abdomen and pelvis CT dated 09/17/2013.) FINDINGS: Urinary Tract: Small amount of excreted contrast in the dependent portion of the urinary bladder. No visible ureteral abnormalities. Bowel: Multiple sigmoid colon diverticula. There is also a fat density mass compatible with a lipoma in the proximal sigmoid colon, measuring 2.5 x 1.9 cm on coronal image number 38. Vascular/Lymphatic: Dense atheromatous arterial calcifications without aneurysm. No enlarged lymph nodes. Reproductive:  Surgically absent uterus.  No adnexal mass. Other: Multiple loculations of air in the subcutaneous fat posterior and inferior to the coccyx and and extending anteriorly to the posterior aspect of the peritoneal reflection posterior to the rectum. No intraperitoneal air seen. Midline surgical scar. Stable right abdominal surgical clips and colonic anastomosis. Musculoskeletal: Diffuse osteopenia. Old, healed left superior and inferior pubic ramus fractures. 3 fixation  screws in the proximal right femur. No acute fracture or dislocation. Right hip and lower lumbar spine degenerative changes. Symphysis pubis does degenerative changes. IMPRESSION: 1. Soft tissue air posterior and inferior to the coccyx and distal sacrum and extending posterior to the peritoneal reflections posterior to the rectum, without intraperitoneal air seen. 2. No acute fracture seen. 3. Colonic diverticulosis. 4. Proximal sigmoid colon lipoma filling the lumen of the colon without obstruction. Electronically Signed   By: Claudie Revering M.D.   On: 09/02/2016 15:55    Procedures Procedures (including critical care time)  Medications Ordered in ED Medications - No data to display   Initial Impression / Assessment and Plan / ED Course  I have reviewed the triage vital signs and the nursing notes.  Pertinent labs & imaging results that were available during my care of the patient were reviewed by me and  considered in my medical decision making (see chart for details).  Clinical Course     Wound looks good.  No sign of infection.   Daughter feels pt is doing well.   Final Clinical Impressions(s) / ED Diagnoses   Final diagnoses:  Visit for wound check    New Prescriptions New Prescriptions   No medications on file      I personally performed the services in this documentation, which was scribed in my presence.  The recorded information has been reviewed and considered.   Ronnald Collum.       Hollace Kinnier [place], PA-C 09/04/16 1323    Elnora Morrison, MD 09/05/16 [phone]

## 2016-09-04 NOTE — ED Triage Notes (Signed)
Per family wound has been draining, pt has fever in triage

## 2016-09-04 NOTE — ED Triage Notes (Signed)
Pt returns for suture removal

## 2016-09-04 NOTE — Discharge Instructions (Signed)
Continue antibiotics.  Continue pain medications.  Return for suture removal in 6 days

## 2016-09-12 ENCOUNTER — Emergency Department (HOSPITAL_COMMUNITY)
Admission: EM | Admit: 2016-09-12 | Discharge: 2016-09-12 | Disposition: A | Discharge status: Home / Self Care | Payer: Medicare Other | Attending: Emergency Medicine | Admitting: Emergency Medicine

## 2016-09-12 ENCOUNTER — Encounter (HOSPITAL_COMMUNITY): Payer: Self-pay | Admitting: Emergency Medicine

## 2016-09-12 DIAGNOSIS — I5033 Acute on chronic diastolic (congestive) heart failure: Secondary | ICD-10-CM | POA: Insufficient documentation

## 2016-09-12 DIAGNOSIS — Z79899 Other long term (current) drug therapy: Secondary | ICD-10-CM | POA: Diagnosis not present

## 2016-09-12 DIAGNOSIS — Z4801 Encounter for change or removal of surgical wound dressing: Secondary | ICD-10-CM | POA: Insufficient documentation

## 2016-09-12 DIAGNOSIS — F0390 Unspecified dementia without behavioral disturbance: Secondary | ICD-10-CM | POA: Insufficient documentation

## 2016-09-12 DIAGNOSIS — Z853 Personal history of malignant neoplasm of breast: Secondary | ICD-10-CM | POA: Diagnosis not present

## 2016-09-12 DIAGNOSIS — G20 Parkinson's disease: Secondary | ICD-10-CM | POA: Diagnosis not present

## 2016-09-12 DIAGNOSIS — Z7982 Long term (current) use of aspirin: Secondary | ICD-10-CM | POA: Diagnosis not present

## 2016-09-12 DIAGNOSIS — Z4802 Encounter for removal of sutures: Secondary | ICD-10-CM | POA: Diagnosis not present

## 2016-09-12 DIAGNOSIS — I110 Hypertensive heart disease with heart failure: Secondary | ICD-10-CM | POA: Insufficient documentation

## 2016-09-12 DIAGNOSIS — Z5189 Encounter for other specified aftercare: Secondary | ICD-10-CM

## 2016-09-12 MED ORDER — HYDROCODONE-ACETAMINOPHEN 5-325 MG PO TABS
1.0000 | ORAL_TABLET | Freq: Once | ORAL | Status: AC
  Administered 2016-09-12: 1 via ORAL
  Filled 2016-09-12: qty 1

## 2016-09-12 MED ORDER — HYDROCODONE-ACETAMINOPHEN 5-325 MG PO TABS: 1.0000 | ORAL_TABLET | Freq: Four times a day (QID) | ORAL | 0 refills | Status: DC | PRN

## 2016-09-12 NOTE — ED Provider Notes (Signed)
[facility] DEPT Provider Note   CSN: [id number] Arrival date & time: 09/12/16  1351     History   Chief Complaint Chief Complaint  Patient presents with  . Suture / Staple Removal    HPI [name] is a 82 y.o. female.  Patient status post slip and the top with a laceration to the right buttocks area along the anal verge. Dr. Rosana Hoes from general surgery consult at that time. Patient also head CT. They decided to close it primarily. That was done by physician assistant in the emergency department. This was December 16. Patient seen for wound check 2 days later with no obvious problems. Patient presents back today to have sutures removed. Patient's laceration was deep did have underlying dissolvable sutures. Looks like the skin was probably closed with Prolene because is blue. Patient apparently is been doing okay. Patient did not follow-up with Dr. Rosana Hoes.      Past Medical History:  Diagnosis Date  . Alzheimer's dementia   . Cancer (HCC)    breast  . CHF (congestive heart failure) ([place])   . Heart attack   . Hypertension   . Parkinson disease [facility])     Patient Active Problem List   Diagnosis Date Noted  . Dyspnea 01/06/2015  . Essential hypertension 01/06/2015  . Depression with anxiety 01/06/2015  . Insomnia 01/06/2015  . Acute on chronic diastolic congestive heart failure ([place])   . Pneumonia 09/24/2013  . Enteritis due to Clostridium difficile 09/23/2013  . SBO (small bowel obstruction) 09/17/2013  . Lesion of liver 09/17/2013  . Microcytic anemia 04/05/2013  . Elevated troponin 04/04/2013  . UTI (urinary tract infection) 04/04/2013  . Dehydration 04/04/2013  . Altered mental status 04/04/2013  . Closed right hip fracture ([place]) 01/31/2013  . Fall 01/31/2013  . Dementia with behavioral disturbance 01/31/2013  . Parkinson disease ([place]) 01/31/2013  . Community acquired pneumonia 06/30/2012  . Weakness generalized 06/30/2012  . Dementia 06/30/2012  .  Proximal phalanx fracture of finger 08/15/2011    Past Surgical History:  Procedure Laterality Date  . ABDOMINAL HYSTERECTOMY    . arm surgery    . BREAST SURGERY    . CHOLECYSTECTOMY    . COLON SURGERY    . HIP PINNING,CANNULATED Right 02/01/2013   Procedure: CANNULATED HIP PINNING;  Surgeon: Sanjuana Kava, MD;  Location: [facility];  Service: Orthopedics;  Laterality: Right;  . LEG SURGERY    . MASTECTOMY    . PELVIC FRACTURE SURGERY    . small intestine removed    . TONSILLECTOMY      OB History    Gravida Para Term Preterm AB Living   4 4 4          SAB TAB Ectopic Multiple Live Births                   Home Medications    Prior to Admission medications   Medication Sig Start Date End Date Taking? Authorizing Provider  acetaminophen (TYLENOL) 500 MG tablet Take 500 mg by mouth at bedtime. For pain   Yes Historical Provider, MD  albuterol (PROVENTIL) (2.5 MG/3ML) 0.083% nebulizer solution Take 2.5 mg by nebulization 4 (four) times daily.    Yes Historical Provider, MD  ALPRAZolam Duanne Moron) 0.5 MG tablet Take 0.5 mg by mouth 3 (three) times daily.    Yes Historical Provider, MD  aspirin EC 81 MG tablet Take 1 tablet (81 mg total) by mouth daily. 04/05/13  Yes Kathie Dike, MD  benztropine (COGENTIN) 1 MG tablet Take 1 tablet (1 mg total) by mouth daily. 04/05/13  Yes Kathie Dike, MD  citalopram (CELEXA) 20 MG tablet Take 1 tablet (20 mg total) by mouth every morning. 04/05/13  Yes Kathie Dike, MD  HYDROcodone-acetaminophen (NORCO/VICODIN) 5-325 MG tablet Take 2 tablets by mouth every 4 (four) hours as needed. 09/02/16  Yes Hollace Kinnier [place], PA-C  metoprolol (LOPRESSOR) 50 MG tablet Take 0.5 tablets (25 mg total) by mouth 2 (two) times daily. 09/24/13  Yes Kathie Dike, MD  rivastigmine (EXELON) 9.5 mg/24hr Place 1 patch (9.5 mg total) onto the skin daily. *Remove and discard used patches* 04/05/13  Yes Kathie Dike, MD  zolpidem (AMBIEN) 10 MG tablet Take 10 mg by mouth at  bedtime as needed for sleep.   Yes Historical Provider, MD  cephALEXin (KEFLEX) 500 MG capsule Take 1 capsule (500 mg total) by mouth 4 (four) times daily. Patient not taking: Reported on 09/12/2016 09/02/16   Fransico Meadow, PA-C  HYDROcodone-acetaminophen (NORCO/VICODIN) 5-325 MG tablet Take 1 tablet by mouth every 6 (six) hours as needed. 09/12/16   Fredia Sorrow, MD    Family History Family History  Problem Relation Age of Onset  . Aneurysm Mother   . Diabetes Father     Social History Social History  Substance Use Topics  . Smoking status: Never Smoker  . Smokeless tobacco: Never Used  . Alcohol use No     Allergies   Adhesive [tape]; Ensure pudding [nutritional supplements]; and Penicillins   Review of Systems Review of Systems  Unable to perform ROS: Dementia     Physical Exam Updated Vital Signs BP 133/92 (BP Location: Right Arm)   Pulse 80   Temp 98.1 F (36.7 C) (Oral)   Resp 20   Ht 5\' 2"  (1.575 m)   Wt 66.2 kg   SpO2 96%   BMI 26.70 kg/m   Physical Exam  Constitutional: She appears well-developed and well-nourished. No distress.  HENT:  Mouth/Throat: Oropharynx is clear and moist.  Cardiovascular: Normal rate, regular rhythm and normal heart sounds.   Pulmonary/Chest: Effort normal and breath sounds normal.  Abdominal: Soft. Bowel sounds are normal. There is no tenderness.  Neurological: She is alert.  Skin:  Perianal area laceration right side to the anal verge measuring about 2-3 cm Prolene sutures in place. No obvious infection. Still some bloody discharge from the peripheral aspect of the laceration. Wound a little bit gaping there.  Nursing note and vitals reviewed.    ED Treatments / Results  Labs (all labs ordered are listed, but only abnormal results are displayed) Labs Reviewed - No data to display  EKG  EKG Interpretation None       Radiology No results found.  Procedures Procedures (including critical care  time)  Medications Ordered in ED Medications  HYDROcodone-acetaminophen (NORCO/VICODIN) 5-325 MG per tablet 1 tablet (1 tablet Oral Given 09/12/16 1814)     Initial Impression / Assessment and Plan / ED Course  I have reviewed the triage vital signs and the nursing notes.  Pertinent labs & imaging results that were available during my care of the patient were reviewed by me and considered in my medical decision making (see chart for details).  Clinical Course    Patient status post fall and evaluation for a laceration on December 16. Patient has significant dementia. Had a wound check 2 days later. Originally also consult with Dr. Rosana Hoes. Was to follow-up with Rosana Hoes but has been coming  back here. Wound is still not completely healed. It's looking good around the anal edge. But still a little separated out from that. The peripheral aspect of the wound is not completely close it is a little bit of bloody discharge. We'll give a little bit more healing time. No signs of infection.  Final Clinical Impressions(s) / ED Diagnoses   Final diagnoses:  Visit for wound check    New Prescriptions New Prescriptions   HYDROCODONE-ACETAMINOPHEN (NORCO/VICODIN) 5-325 MG TABLET    Take 1 tablet by mouth every 6 (six) hours as needed.     Fredia Sorrow, MD 09/12/16 [phone]

## 2016-09-12 NOTE — ED Triage Notes (Signed)
Pt brought in for suture removal. Wound from rectum through buttock.

## 2016-09-12 NOTE — Discharge Instructions (Signed)
Recommend not removing sutures today. Recheck wound in 4 days here or follow-up with Dr. Rosana Hoes general surgery may be again an appointment with him in town. Return for any new or worse symptoms

## 2016-09-12 NOTE — ED Notes (Signed)
Pt's daughter requesting prescription for vicodin.  Notified edp.

## 2016-09-23 DIAGNOSIS — G309 Alzheimer's disease, unspecified: Secondary | ICD-10-CM | POA: Diagnosis not present

## 2016-09-23 DIAGNOSIS — I5031 Acute diastolic (congestive) heart failure: Secondary | ICD-10-CM | POA: Diagnosis not present

## 2016-10-02 DIAGNOSIS — S31821A Laceration without foreign body of left buttock, initial encounter: Secondary | ICD-10-CM | POA: Diagnosis not present

## 2016-10-24 DIAGNOSIS — G309 Alzheimer's disease, unspecified: Secondary | ICD-10-CM | POA: Diagnosis not present

## 2016-10-24 DIAGNOSIS — I5031 Acute diastolic (congestive) heart failure: Secondary | ICD-10-CM | POA: Diagnosis not present

## 2016-11-21 DIAGNOSIS — I5031 Acute diastolic (congestive) heart failure: Secondary | ICD-10-CM | POA: Diagnosis not present

## 2016-11-21 DIAGNOSIS — G309 Alzheimer's disease, unspecified: Secondary | ICD-10-CM | POA: Diagnosis not present

## 2016-12-22 DIAGNOSIS — I5031 Acute diastolic (congestive) heart failure: Secondary | ICD-10-CM | POA: Diagnosis not present

## 2016-12-22 DIAGNOSIS — G309 Alzheimer's disease, unspecified: Secondary | ICD-10-CM | POA: Diagnosis not present

## 2017-01-10 DIAGNOSIS — Z0000 Encounter for general adult medical examination without abnormal findings: Secondary | ICD-10-CM | POA: Diagnosis not present

## 2017-01-10 DIAGNOSIS — I890 Lymphedema, not elsewhere classified: Secondary | ICD-10-CM | POA: Diagnosis not present

## 2017-01-10 DIAGNOSIS — Z9012 Acquired absence of left breast and nipple: Secondary | ICD-10-CM | POA: Diagnosis not present

## 2017-02-21 DIAGNOSIS — G309 Alzheimer's disease, unspecified: Secondary | ICD-10-CM | POA: Diagnosis not present

## 2017-02-21 DIAGNOSIS — I5031 Acute diastolic (congestive) heart failure: Secondary | ICD-10-CM | POA: Diagnosis not present

## 2017-03-23 DIAGNOSIS — G309 Alzheimer's disease, unspecified: Secondary | ICD-10-CM | POA: Diagnosis not present

## 2017-03-23 DIAGNOSIS — I5031 Acute diastolic (congestive) heart failure: Secondary | ICD-10-CM | POA: Diagnosis not present

## 2017-04-10 DIAGNOSIS — I890 Lymphedema, not elsewhere classified: Secondary | ICD-10-CM | POA: Diagnosis not present

## 2017-04-10 DIAGNOSIS — I10 Essential (primary) hypertension: Secondary | ICD-10-CM | POA: Diagnosis not present

## 2017-04-23 DIAGNOSIS — G309 Alzheimer's disease, unspecified: Secondary | ICD-10-CM | POA: Diagnosis not present

## 2017-04-23 DIAGNOSIS — I5031 Acute diastolic (congestive) heart failure: Secondary | ICD-10-CM | POA: Diagnosis not present

## 2017-07-02 DIAGNOSIS — C50912 Malignant neoplasm of unspecified site of left female breast: Secondary | ICD-10-CM | POA: Diagnosis not present

## 2017-07-02 DIAGNOSIS — I5031 Acute diastolic (congestive) heart failure: Secondary | ICD-10-CM | POA: Diagnosis not present

## 2017-07-09 DIAGNOSIS — Z23 Encounter for immunization: Secondary | ICD-10-CM | POA: Diagnosis not present

## 2017-07-09 DIAGNOSIS — S0083XA Contusion of other part of head, initial encounter: Secondary | ICD-10-CM | POA: Diagnosis not present

## 2017-08-02 DIAGNOSIS — I5031 Acute diastolic (congestive) heart failure: Secondary | ICD-10-CM | POA: Diagnosis not present

## 2017-08-02 DIAGNOSIS — C50912 Malignant neoplasm of unspecified site of left female breast: Secondary | ICD-10-CM | POA: Diagnosis not present

## 2017-09-01 DIAGNOSIS — C50912 Malignant neoplasm of unspecified site of left female breast: Secondary | ICD-10-CM | POA: Diagnosis not present

## 2017-09-01 DIAGNOSIS — I5031 Acute diastolic (congestive) heart failure: Secondary | ICD-10-CM | POA: Diagnosis not present

## 2017-10-02 DIAGNOSIS — I5031 Acute diastolic (congestive) heart failure: Secondary | ICD-10-CM | POA: Diagnosis not present

## 2017-10-02 DIAGNOSIS — C50912 Malignant neoplasm of unspecified site of left female breast: Secondary | ICD-10-CM | POA: Diagnosis not present

## 2017-10-16 ENCOUNTER — Encounter (HOSPITAL_COMMUNITY): Payer: Self-pay | Admitting: Emergency Medicine

## 2017-10-16 ENCOUNTER — Emergency Department (HOSPITAL_COMMUNITY): Payer: Medicare Other

## 2017-10-16 ENCOUNTER — Other Ambulatory Visit: Payer: Self-pay

## 2017-10-16 ENCOUNTER — Observation Stay (HOSPITAL_COMMUNITY)
Admission: EM | Admit: 2017-10-16 | Discharge: 2017-10-19 | Disposition: A | Discharge status: Home / Self Care | Payer: Medicare Other | Attending: Internal Medicine | Admitting: Internal Medicine

## 2017-10-16 DIAGNOSIS — Z853 Personal history of malignant neoplasm of breast: Secondary | ICD-10-CM | POA: Diagnosis not present

## 2017-10-16 DIAGNOSIS — I5030 Unspecified diastolic (congestive) heart failure: Secondary | ICD-10-CM | POA: Diagnosis not present

## 2017-10-16 DIAGNOSIS — J440 Chronic obstructive pulmonary disease with acute lower respiratory infection: Secondary | ICD-10-CM

## 2017-10-16 DIAGNOSIS — R509 Fever, unspecified: Secondary | ICD-10-CM

## 2017-10-16 DIAGNOSIS — F418 Other specified anxiety disorders: Secondary | ICD-10-CM | POA: Diagnosis not present

## 2017-10-16 DIAGNOSIS — Z79899 Other long term (current) drug therapy: Secondary | ICD-10-CM | POA: Diagnosis not present

## 2017-10-16 DIAGNOSIS — I5032 Chronic diastolic (congestive) heart failure: Secondary | ICD-10-CM

## 2017-10-16 DIAGNOSIS — R0682 Tachypnea, not elsewhere classified: Secondary | ICD-10-CM | POA: Diagnosis not present

## 2017-10-16 DIAGNOSIS — J209 Acute bronchitis, unspecified: Secondary | ICD-10-CM | POA: Diagnosis not present

## 2017-10-16 DIAGNOSIS — J441 Chronic obstructive pulmonary disease with (acute) exacerbation: Secondary | ICD-10-CM | POA: Diagnosis not present

## 2017-10-16 DIAGNOSIS — F03918 Unspecified dementia, unspecified severity, with other behavioral disturbance: Secondary | ICD-10-CM | POA: Diagnosis present

## 2017-10-16 DIAGNOSIS — G20 Parkinson's disease: Secondary | ICD-10-CM | POA: Insufficient documentation

## 2017-10-16 DIAGNOSIS — F0280 Dementia in other diseases classified elsewhere without behavioral disturbance: Secondary | ICD-10-CM

## 2017-10-16 DIAGNOSIS — G309 Alzheimer's disease, unspecified: Secondary | ICD-10-CM | POA: Insufficient documentation

## 2017-10-16 DIAGNOSIS — R0602 Shortness of breath: Secondary | ICD-10-CM | POA: Diagnosis not present

## 2017-10-16 DIAGNOSIS — Z7982 Long term (current) use of aspirin: Secondary | ICD-10-CM | POA: Insufficient documentation

## 2017-10-16 DIAGNOSIS — R39198 Other difficulties with micturition: Secondary | ICD-10-CM | POA: Diagnosis not present

## 2017-10-16 DIAGNOSIS — G9341 Metabolic encephalopathy: Secondary | ICD-10-CM | POA: Diagnosis not present

## 2017-10-16 DIAGNOSIS — I110 Hypertensive heart disease with heart failure: Secondary | ICD-10-CM | POA: Insufficient documentation

## 2017-10-16 DIAGNOSIS — F0281 Dementia in other diseases classified elsewhere with behavioral disturbance: Secondary | ICD-10-CM | POA: Insufficient documentation

## 2017-10-16 DIAGNOSIS — R471 Dysarthria and anarthria: Secondary | ICD-10-CM

## 2017-10-16 DIAGNOSIS — R531 Weakness: Secondary | ICD-10-CM | POA: Diagnosis not present

## 2017-10-16 DIAGNOSIS — I10 Essential (primary) hypertension: Secondary | ICD-10-CM | POA: Diagnosis present

## 2017-10-16 DIAGNOSIS — F0390 Unspecified dementia without behavioral disturbance: Secondary | ICD-10-CM | POA: Diagnosis present

## 2017-10-16 DIAGNOSIS — Z9010 Acquired absence of unspecified breast and nipple: Secondary | ICD-10-CM | POA: Diagnosis not present

## 2017-10-16 DIAGNOSIS — F0391 Unspecified dementia with behavioral disturbance: Secondary | ICD-10-CM | POA: Diagnosis present

## 2017-10-16 LAB — URINALYSIS, ROUTINE W REFLEX MICROSCOPIC
Bilirubin Urine: NEGATIVE
Glucose, UA: NEGATIVE mg/dL
Ketones, ur: NEGATIVE mg/dL
Leukocytes, UA: NEGATIVE
Nitrite: NEGATIVE
Protein, ur: NEGATIVE mg/dL
Specific Gravity, Urine: 1.018 (ref 1.005–1.030)
Squamous Epithelial / LPF: NONE SEEN
pH: 5 (ref 5.0–8.0)

## 2017-10-16 LAB — CBC WITH DIFFERENTIAL/PLATELET
Basophils Absolute: 0 10*3/uL (ref 0.0–0.1)
Basophils Relative: 0 %
Eosinophils Absolute: 0.2 10*3/uL (ref 0.0–0.7)
Eosinophils Relative: 1 %
HCT: 35.7 % — ABNORMAL LOW (ref 36.0–46.0)
Hemoglobin: 10.7 g/dL — ABNORMAL LOW (ref 12.0–15.0)
Lymphocytes Relative: 19 %
Lymphs Abs: 2.5 10*3/uL (ref 0.7–4.0)
MCH: 22.6 pg — ABNORMAL LOW (ref 26.0–34.0)
MCHC: 30 g/dL (ref 30.0–36.0)
MCV: 75.3 fL — ABNORMAL LOW (ref 78.0–100.0)
Monocytes Absolute: 1.1 10*3/uL — ABNORMAL HIGH (ref 0.1–1.0)
Monocytes Relative: 8 %
Neutro Abs: 9.2 10*3/uL — ABNORMAL HIGH (ref 1.7–7.7)
Neutrophils Relative %: 72 %
Platelets: 292 10*3/uL (ref 150–400)
RBC: 4.74 MIL/uL (ref 3.87–5.11)
RDW: 16.7 % — ABNORMAL HIGH (ref 11.5–15.5)
WBC: 13 10*3/uL — ABNORMAL HIGH (ref 4.0–10.5)

## 2017-10-16 LAB — BLOOD GAS, VENOUS
Acid-base deficit: 1.3 mmol/L (ref 0.0–2.0)
Bicarbonate: 22.1 mmol/L (ref 20.0–28.0)
O2 Content: 9 L/min
O2 Saturation: 56.5 %
Patient temperature: 37
pCO2, Ven: 49.9 mmHg (ref 44.0–60.0)
pH, Ven: 7.307 (ref 7.250–7.430)
pO2, Ven: 34.8 mmHg (ref 32.0–45.0)

## 2017-10-16 LAB — COMPREHENSIVE METABOLIC PANEL
ALT: 14 U/L (ref 14–54)
AST: 22 U/L (ref 15–41)
Albumin: 3.2 g/dL — ABNORMAL LOW (ref 3.5–5.0)
Alkaline Phosphatase: 86 U/L (ref 38–126)
Anion gap: 10 (ref 5–15)
BUN: 15 mg/dL (ref 6–20)
CO2: 23 mmol/L (ref 22–32)
Calcium: 8.8 mg/dL — ABNORMAL LOW (ref 8.9–10.3)
Chloride: 106 mmol/L (ref 101–111)
Creatinine, Ser: 0.89 mg/dL (ref 0.44–1.00)
GFR calc Af Amer: >60 mL/min (ref >60)
GFR calc non Af Amer: 53 mL/min — ABNORMAL LOW (ref >60)
Glucose, Bld: 103 mg/dL — ABNORMAL HIGH (ref 65–99)
Potassium: 4.7 mmol/L (ref 3.5–5.1)
Sodium: 139 mmol/L (ref 135–145)
Total Bilirubin: 0.3 mg/dL (ref 0.3–1.2)
Total Protein: 8.1 g/dL (ref 6.5–8.1)

## 2017-10-16 LAB — I-STAT CG4 LACTIC ACID, ED: Lactic Acid, Venous: 1.61 mmol/L (ref 0.5–1.9)

## 2017-10-16 LAB — INFLUENZA PANEL BY PCR (TYPE A & B)
Influenza A By PCR: NEGATIVE
Influenza B By PCR: NEGATIVE

## 2017-10-16 LAB — BRAIN NATRIURETIC PEPTIDE: B Natriuretic Peptide: 230 pg/mL — ABNORMAL HIGH (ref 0.0–100.0)

## 2017-10-16 LAB — I-STAT TROPONIN, ED: Troponin i, poc: 0.02 ng/mL (ref 0.00–0.08)

## 2017-10-16 MED ORDER — ACETAMINOPHEN 325 MG PO TABS
650.0000 mg | ORAL_TABLET | Freq: Four times a day (QID) | ORAL | Status: DC | PRN
  Administered 2017-10-16: 650 mg via ORAL
  Filled 2017-10-16 (×2): qty 2

## 2017-10-16 MED ORDER — ASPIRIN EC 81 MG PO TBEC
81.0000 mg | DELAYED_RELEASE_TABLET | Freq: Every day | ORAL | Status: DC
  Administered 2017-10-17 – 2017-10-19 (×3): 81 mg via ORAL
  Filled 2017-10-16 (×3): qty 1

## 2017-10-16 MED ORDER — ENOXAPARIN SODIUM 40 MG/0.4ML ~~LOC~~ SOLN
40.0000 mg | SUBCUTANEOUS | Status: DC
  Administered 2017-10-17 – 2017-10-18 (×3): 40 mg via SUBCUTANEOUS
  Filled 2017-10-16 (×3): qty 0.4

## 2017-10-16 MED ORDER — ACETAMINOPHEN 650 MG RE SUPP
650.0000 mg | Freq: Once | RECTAL | Status: AC
  Administered 2017-10-16: 650 mg via RECTAL
  Filled 2017-10-16: qty 1

## 2017-10-16 MED ORDER — ONDANSETRON HCL 4 MG PO TABS: 4.0000 mg | ORAL_TABLET | Freq: Four times a day (QID) | ORAL | Status: DC | PRN

## 2017-10-16 MED ORDER — ONDANSETRON HCL 4 MG/2ML IJ SOLN: 4.0000 mg | Freq: Four times a day (QID) | INTRAMUSCULAR | Status: DC | PRN

## 2017-10-16 MED ORDER — ACETAMINOPHEN 325 MG PO TABS: 650.0000 mg | ORAL_TABLET | Freq: Four times a day (QID) | ORAL | Status: DC | PRN

## 2017-10-16 MED ORDER — METHYLPREDNISOLONE SODIUM SUCC 125 MG IJ SOLR
60.0000 mg | Freq: Two times a day (BID) | INTRAMUSCULAR | Status: DC
  Administered 2017-10-17 – 2017-10-18 (×4): 60 mg via INTRAVENOUS
  Filled 2017-10-16 (×4): qty 2

## 2017-10-16 MED ORDER — SODIUM CHLORIDE 0.9 % IV SOLN: 250.0000 mL | INTRAVENOUS | Status: DC | PRN

## 2017-10-16 MED ORDER — IPRATROPIUM-ALBUTEROL 0.5-2.5 (3) MG/3ML IN SOLN
3.0000 mL | Freq: Once | RESPIRATORY_TRACT | Status: AC
  Administered 2017-10-16: 3 mL via RESPIRATORY_TRACT
  Filled 2017-10-16: qty 3

## 2017-10-16 MED ORDER — ALPRAZOLAM 0.5 MG PO TABS
0.5000 mg | ORAL_TABLET | Freq: Three times a day (TID) | ORAL | Status: DC
  Administered 2017-10-17: 0.5 mg via ORAL
  Filled 2017-10-16 (×2): qty 1

## 2017-10-16 MED ORDER — ZOLPIDEM TARTRATE 5 MG PO TABS: 5.0000 mg | ORAL_TABLET | Freq: Every evening | ORAL | Status: DC | PRN

## 2017-10-16 MED ORDER — GUAIFENESIN 100 MG/5ML PO SYRP
200.0000 mg | ORAL_SOLUTION | Freq: Three times a day (TID) | ORAL | Status: DC | PRN
  Filled 2017-10-16: qty 10

## 2017-10-16 MED ORDER — METOPROLOL TARTRATE 25 MG PO TABS
25.0000 mg | ORAL_TABLET | Freq: Two times a day (BID) | ORAL | Status: DC
  Administered 2017-10-17 – 2017-10-19 (×5): 25 mg via ORAL
  Filled 2017-10-16 (×5): qty 1

## 2017-10-16 MED ORDER — ALBUTEROL SULFATE (2.5 MG/3ML) 0.083% IN NEBU: 5.0000 mg | INHALATION_SOLUTION | Freq: Once | RESPIRATORY_TRACT | Status: DC

## 2017-10-16 MED ORDER — DOXYCYCLINE HYCLATE 100 MG PO TABS
100.0000 mg | ORAL_TABLET | Freq: Two times a day (BID) | ORAL | Status: DC
  Administered 2017-10-17: 100 mg via ORAL
  Filled 2017-10-16: qty 1

## 2017-10-16 MED ORDER — ALPRAZOLAM 0.5 MG PO TABS
0.5000 mg | ORAL_TABLET | Freq: Once | ORAL | Status: AC
  Administered 2017-10-16: 0.5 mg via ORAL
  Filled 2017-10-16: qty 1

## 2017-10-16 MED ORDER — ALBUTEROL SULFATE (2.5 MG/3ML) 0.083% IN NEBU
2.5000 mg | INHALATION_SOLUTION | RESPIRATORY_TRACT | Status: DC
Start: 2017-10-16 — End: 2017-10-16
  Administered 2017-10-16: 2.5 mg via RESPIRATORY_TRACT
  Filled 2017-10-16: qty 3

## 2017-10-16 MED ORDER — RIVASTIGMINE 9.5 MG/24HR TD PT24
9.5000 mg | MEDICATED_PATCH | Freq: Every day | TRANSDERMAL | Status: DC
Start: 2017-10-16 — End: 2017-10-19
  Administered 2017-10-17 – 2017-10-19 (×4): 9.5 mg via TRANSDERMAL
  Filled 2017-10-16 (×7): qty 1

## 2017-10-16 MED ORDER — BENZTROPINE MESYLATE 1 MG PO TABS
1.0000 mg | ORAL_TABLET | Freq: Every day | ORAL | Status: DC
  Administered 2017-10-17 – 2017-10-19 (×3): 1 mg via ORAL
  Filled 2017-10-16 (×4): qty 1

## 2017-10-16 MED ORDER — ALBUTEROL SULFATE (2.5 MG/3ML) 0.083% IN NEBU: 2.5000 mg | INHALATION_SOLUTION | RESPIRATORY_TRACT | Status: DC | PRN

## 2017-10-16 MED ORDER — GUAIFENESIN ER 600 MG PO TB12
600.0000 mg | ORAL_TABLET | Freq: Two times a day (BID) | ORAL | Status: DC
  Administered 2017-10-17 – 2017-10-19 (×5): 600 mg via ORAL
  Filled 2017-10-16 (×5): qty 1

## 2017-10-16 MED ORDER — ASPIRIN EC 81 MG PO TBEC
81.0000 mg | DELAYED_RELEASE_TABLET | Freq: Every day | ORAL | Status: DC
  Administered 2017-10-16: 81 mg via ORAL
  Filled 2017-10-16 (×2): qty 1

## 2017-10-16 MED ORDER — SODIUM CHLORIDE 0.9% FLUSH: 3.0000 mL | INTRAVENOUS | Status: DC | PRN

## 2017-10-16 MED ORDER — HYDROCODONE-ACETAMINOPHEN 5-325 MG PO TABS: 1.0000 | ORAL_TABLET | Freq: Four times a day (QID) | ORAL | Status: DC | PRN

## 2017-10-16 MED ORDER — ALPRAZOLAM 0.5 MG PO TABS: 0.5000 mg | ORAL_TABLET | Freq: Three times a day (TID) | ORAL | Status: DC | PRN

## 2017-10-16 MED ORDER — CITALOPRAM HYDROBROMIDE 20 MG PO TABS
20.0000 mg | ORAL_TABLET | Freq: Every morning | ORAL | Status: DC
  Administered 2017-10-17 – 2017-10-19 (×3): 20 mg via ORAL
  Filled 2017-10-16 (×3): qty 1

## 2017-10-16 MED ORDER — SODIUM CHLORIDE 0.9% FLUSH
3.0000 mL | Freq: Two times a day (BID) | INTRAVENOUS | Status: DC
  Administered 2017-10-17 – 2017-10-19 (×6): 3 mL via INTRAVENOUS

## 2017-10-16 MED ORDER — IPRATROPIUM-ALBUTEROL 0.5-2.5 (3) MG/3ML IN SOLN
3.0000 mL | Freq: Four times a day (QID) | RESPIRATORY_TRACT | Status: DC
  Administered 2017-10-16 – 2017-10-17 (×3): 3 mL via RESPIRATORY_TRACT
  Filled 2017-10-16 (×3): qty 3

## 2017-10-16 MED ORDER — ACETAMINOPHEN 650 MG RE SUPP: 650.0000 mg | Freq: Four times a day (QID) | RECTAL | Status: DC | PRN

## 2017-10-16 MED ORDER — METHYLPREDNISOLONE SODIUM SUCC 125 MG IJ SOLR
80.0000 mg | Freq: Once | INTRAMUSCULAR | Status: AC
  Administered 2017-10-16: 80 mg via INTRAVENOUS
  Filled 2017-10-16: qty 2

## 2017-10-16 NOTE — Progress Notes (Signed)
RN tried to get pt to eat applesauce, but pt closed her mouth and refused to eat any applesauce or take any  Medications. RN educated pt on medications and reasoning for needing to take them, pt continues to keep her mouth closed and not say anything. Dr. Baltazar Najjar paged and made aware that pt is not taking any of her medications. Waiting for call back/orders.

## 2017-10-16 NOTE — ED Notes (Signed)
Respiratory paged at this time for tx.  

## 2017-10-16 NOTE — ED Notes (Signed)
Report given to 300 RN at this time, call [phone] to contact RN if needed.

## 2017-10-16 NOTE — ED Provider Notes (Signed)
Emergency Department Provider Note   I have reviewed the triage vital signs and the nursing notes.   HISTORY  Chief Complaint Shortness of Breath   HPI [name] is a 84 y.o. female with PMH of dementia, CHF, HTN, and CAD presents to th ED by EMS from home with SOB, fever, and confusion. EMS reports wheezing throughout on their arrival and started Duoneb. O2 sat not initially working for them on scene so unknown initial room air sat but recorded a 94% after Duoneb.   Level 5 caveat: Dementia and Respiratory distress    Past Medical History:  Diagnosis Date  . Alzheimer's dementia   . Cancer (HCC)    breast  . CHF (congestive heart failure) ([place])   . Heart attack ([place])   . Hypertension   . Parkinson disease [facility])     Patient Active Problem List   Diagnosis Date Noted  . Acute bronchitis 10/16/2017  . Dyspnea 01/06/2015  . Essential hypertension 01/06/2015  . Depression with anxiety 01/06/2015  . Insomnia 01/06/2015  . Acute on chronic diastolic congestive heart failure ([place])   . Pneumonia 09/24/2013  . Enteritis due to Clostridium difficile 09/23/2013  . SBO (small bowel obstruction) ([place]) 09/17/2013  . Lesion of liver 09/17/2013  . Microcytic anemia 04/05/2013  . Elevated troponin 04/04/2013  . UTI (urinary tract infection) 04/04/2013  . Dehydration 04/04/2013  . Altered mental status 04/04/2013  . Closed right hip fracture ([place]) 01/31/2013  . Fall 01/31/2013  . Dementia with behavioral disturbance 01/31/2013  . Parkinson disease ([place]) 01/31/2013  . Community acquired pneumonia 06/30/2012  . Weakness generalized 06/30/2012  . Dementia 06/30/2012  . Proximal phalanx fracture of finger 08/15/2011    Past Surgical History:  Procedure Laterality Date  . ABDOMINAL HYSTERECTOMY    . arm surgery    . BREAST SURGERY    . CHOLECYSTECTOMY    . COLON SURGERY    . HIP PINNING,CANNULATED Right 02/01/2013   Procedure: CANNULATED HIP PINNING;  Surgeon: Sanjuana Kava, MD;  Location: [facility];  Service: Orthopedics;  Laterality: Right;  . LEG SURGERY    . MASTECTOMY    . PELVIC FRACTURE SURGERY    . small intestine removed    . TONSILLECTOMY        Allergies Adhesive [tape]; Ensure pudding [nutritional supplements]; and Penicillins  Family History  Problem Relation Age of Onset  . Aneurysm Mother   . Diabetes Father     Social History Social History   Tobacco Use  . Smoking status: Never Smoker  . Smokeless tobacco: Never Used  Substance Use Topics  . Alcohol use: No  . Drug use: No    Review of Systems  Level 5 caveat: Dementia and Respiratory Distress  ____________________________________________   PHYSICAL EXAM:  VITAL SIGNS: Vitals:   10/16/17 1500 10/16/17 1530  BP: 136/90   Pulse: (!) 117 (!) 110  Resp: (!) 27 (!) 23  Temp:    SpO2: 93% 94%     Constitutional: Following commands but confused and in some respiratory distress.  Eyes: Conjunctivae are normal.  Head: Atraumatic. Nose: No congestion/rhinnorhea. Mouth/Throat: Mucous membranes are dry.  Neck: No stridor. Cardiovascular: Normal rate, regular rhythm. Good peripheral circulation. Grossly normal heart sounds.   Respiratory: Increased respiratory effort.  No retractions. Lungs with diffuse end-expiratory wheezing.  Gastrointestinal: Soft and nontender. No distention.  Musculoskeletal: No lower extremity tenderness nor edema. No gross deformities of extremities. Neurologic: No gross focal neurologic deficits  are appreciated.  Skin:  Skin is warm, dry and intact. No rash noted.  ____________________________________________   LABS (all labs ordered are listed, but only abnormal results are displayed)  Labs Reviewed  COMPREHENSIVE METABOLIC PANEL - Abnormal; Notable for the following components:      Result Value   Glucose, Bld 103 (*)    Calcium 8.8 (*)    Albumin 3.2 (*)    GFR calc non Af Amer 53 (*)    All other components within normal  limits  CBC WITH DIFFERENTIAL/PLATELET - Abnormal; Notable for the following components:   WBC 13.0 (*)    Hemoglobin 10.7 (*)    HCT 35.7 (*)    MCV 75.3 (*)    MCH 22.6 (*)    RDW 16.7 (*)    Neutro Abs 9.2 (*)    Monocytes Absolute 1.1 (*)    All other components within normal limits  URINALYSIS, ROUTINE W REFLEX MICROSCOPIC - Abnormal; Notable for the following components:   Hgb urine dipstick SMALL (*)    Bacteria, UA RARE (*)    All other components within normal limits  BRAIN NATRIURETIC PEPTIDE - Abnormal; Notable for the following components:   B Natriuretic Peptide 230.0 (*)    All other components within normal limits  CULTURE, BLOOD (ROUTINE X 2)  CULTURE, BLOOD (ROUTINE X 2)  BLOOD GAS, VENOUS  INFLUENZA PANEL BY PCR (TYPE A & B)  I-STAT CG4 LACTIC ACID, ED  I-STAT TROPONIN, ED   ____________________________________________  EKG  Reviewed at bedside. Not crossing into MUSE.   Rate: 102   Sinus rhythm. Narrow QRS. No ST elevation or depression. Normal T waves. No STEMI.  ____________________________________________  RADIOLOGY  Dg Chest Port 1 View  Result Date: 10/16/2017 CLINICAL DATA:  Shortness of breath, fever, confusion EXAM: PORTABLE CHEST 1 VIEW COMPARISON:  06/20/2016 FINDINGS: Cardiomegaly with pulmonary vascular congestion. No frank interstitial edema. Mild linear scarring in the right mid lung. No pleural effusion or pneumothorax. IMPRESSION: Cardiomegaly with pulmonary vascular congestion. No frank interstitial edema. Electronically Signed   By: Julian Hy M.D.   On: 10/16/2017 10:00    ____________________________________________   PROCEDURES  Procedure(s) performed:   Procedures  None ____________________________________________   INITIAL IMPRESSION / ASSESSMENT AND PLAN / ED COURSE  Pertinent labs & imaging results that were available during my care of the patient were reviewed by me and considered in my medical decision  making (see chart for details).  Patient presents to the emergency department for evaluation of confusion, respiratory distress, fever.  Symptoms ongoing for the past several days worsening this morning.  EMS gave DuoNeb in route.  Unknown initial oxygen saturation but sats are in the mid to upper 90s here while undergoing neb treatment.  Patient has dementia at baseline which is limiting my history and review of systems as noted above.  Plan for additional nebulizer therapy, chest x-ray, sepsis evaluation.  I have added VBG to assess her for possible CO2 retention.  No indication for advanced airway control at this time.   09:44 AM Family now at bedside.  They state that the patient's home nursing aide was sick with cold-like symptoms earlier in the week.  States shortly after the patient developed runny nose, cough, and congestion.  Symptoms have worsened over the past several days which prompted the emergency department visit.  Patient did get the flu shot and pneumonia shot this year. They state that the patient is at her mental status baseline.  ____________________________________________  FINAL CLINICAL IMPRESSION(S) / ED DIAGNOSES  Final diagnoses:  COPD exacerbation ([place])  Fever, unspecified fever cause     MEDICATIONS GIVEN DURING THIS VISIT:  Medications  albuterol (PROVENTIL) (2.5 MG/3ML) 0.083% nebulizer solution 2.5 mg (2.5 mg Nebulization Given 10/16/17 1427)  ipratropium-albuterol (DUONEB) 0.5-2.5 (3) MG/3ML nebulizer solution 3 mL (3 mLs Nebulization Given 10/16/17 0925)  acetaminophen (TYLENOL) suppository 650 mg (650 mg Rectal Given 10/16/17 0918)  ALPRAZolam Duanne Moron) tablet 0.5 mg (0.5 mg Oral Given 10/16/17 1143)  methylPREDNISolone sodium succinate (SOLU-MEDROL) 125 mg/2 mL injection 80 mg (80 mg Intravenous Given 10/16/17 1357)     Note:  This document was prepared using Dragon voice recognition software and may include unintentional dictation errors.  Nanda Quinton,  MD Emergency Medicine    Long, [facility], MD 10/16/17 [phone]

## 2017-10-16 NOTE — Progress Notes (Signed)
   10/16/17 1805  What Happened  Was fall witnessed? Yes  Who witnessed fall? (nurse, Threasa Alpha; daughter and sister)  Patients activity before fall bathroom-assisted  Point of contact buttocks  Was patient injured? No  Follow Up  MD notified (Dr. Roderic Palau)  Time MD notified [phone]  Family notified Yes-comment  Time family notified 1805  Additional tests No  Simple treatment Other (comment) (None)  Progress note created (see row info) Yes  Adult Fall Risk Assessment  Risk Factor Category (scoring not indicated) Fall has occurred during this admission (document High fall risk)  Patient's Fall Risk High Fall Risk (>13 points)  Adult Fall Risk Interventions  Required Bundle Interventions *See Row Information* High fall risk - low, moderate, and high requirements implemented  Additional Interventions Use of appropriate toileting equipment (bedpan, BSC, etc.)  Screening for Fall Injury Risk  Risk For Fall Injury- See Row Information  Bones - fracture risk  Required Injury Bundle Interventions *See Row Information* Injury Bundle Implemented  Screening for Fall Injury Risk Interventions  Additional Interventions (bed alarm)  Vitals  Temp 99 F (37.2 C)  Temp Source Oral  BP 135/85  BP Location Left Leg  BP Method Automatic  Patient Position (if appropriate) Lying  Pulse Rate (!) 110  Resp (!) 22  Oxygen Therapy  SpO2 95 %  O2 Device Room Air

## 2017-10-16 NOTE — ED Notes (Signed)
Per MD Long pt is able to eat, called for a soft food tray at this time.

## 2017-10-16 NOTE — ED Triage Notes (Addendum)
Pt from home. C/o of SOB and confusion starting Sunday with fever. Pt has HX of dementia. Wheezing noted throughout.  EMS placed pt on 2L [place].  5 mg of albuterol and 0.5 of Atrovent given en route by EMS.

## 2017-10-16 NOTE — H&P (Signed)
History and Physical    [name] KYH:[id number] DOB: 1920/04/21 DOA: 10/16/2017  PCP: Iona Beard, MD  Patient coming from: Home  I have personally briefly reviewed patient's old medical records in [place]  Chief Complaint: Shortness of breath  HPI: [name] is a 84 y.o. female with medical history significant of dementia, hypertension, anxiety, presents to the hospital with shortness of breath.  Patient's family reports that since Sunday, she had worsening shortness of breath, wheezing and mild cough.  Apparently, 1 of her nursing aides had a viral syndrome.  They see the patient has been feverish at home.  She has not had any vomiting or diarrhea.  No dysuria.  ED Course: Patient was noted to be substantially wheezing but really.  Chest x-ray did not show any pneumonia.  Blood work was otherwise unremarkable.  EKG did not show any acute changes.  She received a nebulizer treatment as well as steroids with some improvement of her symptoms.  She was noted to be mildly febrile.  She was referred for admission.  Review of Systems: As per HPI otherwise 10 point review of systems negative.   Past Medical History:  Diagnosis Date  . Alzheimer's dementia   . Cancer (HCC)    breast  . CHF (congestive heart failure) ([place])   . Heart attack ([place])   . Hypertension   . Parkinson disease [facility])     Past Surgical History:  Procedure Laterality Date  . ABDOMINAL HYSTERECTOMY    . arm surgery    . BREAST SURGERY    . CHOLECYSTECTOMY    . COLON SURGERY    . HIP PINNING,CANNULATED Right 02/01/2013   Procedure: CANNULATED HIP PINNING;  Surgeon: Sanjuana Kava, MD;  Location: [facility];  Service: Orthopedics;  Laterality: Right;  . LEG SURGERY    . MASTECTOMY    . PELVIC FRACTURE SURGERY    . small intestine removed    . TONSILLECTOMY       reports that  has never smoked. she has never used smokeless tobacco. She reports that she does not drink alcohol or use drugs.  Allergies    Allergen Reactions  . Adhesive [Tape] Rash  . Ensure Pudding [Nutritional Supplements] Swelling and Rash    Throat swelling  . Penicillins Swelling and Rash    Has patient had a PCN reaction causing immediate rash, facial/tongue/throat swelling, SOB or lightheadedness with hypotension: Yes Has patient had a PCN reaction causing severe rash involving mucus membranes or skin necrosis: Yes Has patient had a PCN reaction that required hospitalization No Has patient had a PCN reaction occurring within the last 10 years: No If all of the above answers are "NO", then may proceed with Cephalosporin use.    Family History  Problem Relation Age of Onset  . Aneurysm Mother   . Diabetes Father     Prior to Admission medications   Medication Sig Start Date End Date Taking? Authorizing Provider  acetaminophen (TYLENOL) 500 MG tablet Take 500 mg by mouth at bedtime. For pain   Yes [provider]  albuterol (PROVENTIL) (2.5 MG/3ML) 0.083% nebulizer solution Take 2.5 mg by nebulization 4 (four) times daily.    Yes [provider]  ALPRAZolam Duanne Moron) 0.5 MG tablet Take 0.5 mg by mouth 3 (three) times daily.    Yes [provider]  aspirin EC 81 MG tablet Take 1 tablet (81 mg total) by mouth daily. 04/05/13  Yes Kathie Dike, MD  benztropine (  COGENTIN) 1 MG tablet Take 1 tablet (1 mg total) by mouth daily. 04/05/13  Yes Kathie Dike, MD  citalopram (CELEXA) 20 MG tablet Take 1 tablet (20 mg total) by mouth every morning. 04/05/13  Yes Kathie Dike, MD  guaifenesin (ROBITUSSIN) 100 MG/5ML syrup Take 200 mg by mouth 3 (three) times daily as needed for cough.   Yes [provider]  metoprolol (LOPRESSOR) 50 MG tablet Take 0.5 tablets (25 mg total) by mouth 2 (two) times daily. 09/24/13  Yes Kathie Dike, MD  rivastigmine (EXELON) 9.5 mg/24hr Place 1 patch (9.5 mg total) onto the skin daily. *Remove and discard used patches* 04/05/13  Yes Ayauna Mcnay, Jolaine Artist, MD   zolpidem (AMBIEN) 10 MG tablet Take 10 mg by mouth at bedtime as needed for sleep.   Yes [provider]  HYDROcodone-acetaminophen (NORCO/VICODIN) 5-325 MG tablet Take 1 tablet by mouth every 6 (six) hours as needed. 09/12/16   Fredia Sorrow, MD    Physical Exam: Vitals:   10/16/17 1430 10/16/17 1500 10/16/17 1530 10/16/17 1559  BP:  136/90  (!) 144/95  Pulse:  (!) 117 (!) 110 (!) 105  Resp:  (!) 27 (!) 23 (!) 22  Temp:    99.1 F (37.3 C)  TempSrc:    Oral  SpO2: 100% 93% 94% 94%  Weight:    69 kg (152 lb 1.9 oz)    Constitutional: NAD, calm, comfortable Vitals:   10/16/17 1430 10/16/17 1500 10/16/17 1530 10/16/17 1559  BP:  136/90  (!) 144/95  Pulse:  (!) 117 (!) 110 (!) 105  Resp:  (!) 27 (!) 23 (!) 22  Temp:    99.1 F (37.3 C)  TempSrc:    Oral  SpO2: 100% 93% 94% 94%  Weight:    69 kg (152 lb 1.9 oz)   Eyes: PERRL, lids and conjunctivae normal ENMT: Mucous membranes are moist. Posterior pharynx clear of any exudate or lesions.Normal dentition.  Neck: normal, supple, no masses, no thyromegaly Respiratory: Bilateral wheezes.  Normal respiratory effort. No accessory muscle use.  Cardiovascular: Regular rate and rhythm, no murmurs / rubs / gallops. No extremity edema. 2+ pedal pulses. No carotid bruits.  Abdomen: no tenderness, no masses palpated. No hepatosplenomegaly. Bowel sounds positive.  Musculoskeletal: no clubbing / cyanosis. No joint deformity upper and lower extremities. Good ROM, no contractures. Normal muscle tone.  Skin: no rashes, lesions, ulcers. No induration Neurologic: CN 2-12 grossly intact. Sensation intact, DTR normal. Strength 5/5 in all 4.  Psychiatric: Confused, dementia  Labs on Admission: I have personally reviewed following labs and imaging studies  CBC: Recent Labs  Lab 10/16/17 0914  WBC 13.0*  NEUTROABS 9.2*  HGB 10.7*  HCT 35.7*  MCV 75.3*  PLT 716   Basic Metabolic Panel: Recent Labs  Lab 10/16/17 0914  NA 139   K 4.7  CL 106  CO2 23  GLUCOSE 103*  BUN 15  CREATININE 0.89  CALCIUM 8.8*   GFR: CrCl cannot be calculated (Unknown ideal weight.). Liver Function Tests: Recent Labs  Lab 10/16/17 0914  AST 22  ALT 14  ALKPHOS 86  BILITOT 0.3  PROT 8.1  ALBUMIN 3.2*   No results for input(s): LIPASE, AMYLASE in the last 168 hours. No results for input(s): AMMONIA in the last 168 hours. Coagulation Profile: No results for input(s): INR, PROTIME in the last 168 hours. Cardiac Enzymes: No results for input(s): CKTOTAL, CKMB, CKMBINDEX, TROPONINI in the last 168 hours. BNP (last 3 results) No results  for input(s): PROBNP in the last 8760 hours. HbA1C: No results for input(s): HGBA1C in the last 72 hours. CBG: No results for input(s): GLUCAP in the last 168 hours. Lipid Profile: No results for input(s): CHOL, HDL, LDLCALC, TRIG, CHOLHDL, LDLDIRECT in the last 72 hours. Thyroid Function Tests: No results for input(s): TSH, T4TOTAL, FREET4, T3FREE, THYROIDAB in the last 72 hours. Anemia Panel: No results for input(s): VITAMINB12, FOLATE, FERRITIN, TIBC, IRON, RETICCTPCT in the last 72 hours. Urine analysis:    Component Value Date/Time   COLORURINE YELLOW 10/16/2017 [place] 10/16/2017 0857   LABSPEC 1.018 10/16/2017 0857   PHURINE 5.0 10/16/2017 0857   GLUCOSEU NEGATIVE 10/16/2017 0857   HGBUR SMALL (A) 10/16/2017 0857   BILIRUBINUR NEGATIVE 10/16/2017 0857   KETONESUR NEGATIVE 10/16/2017 0857   PROTEINUR NEGATIVE 10/16/2017 0857   UROBILINOGEN 0.2 01/06/2015 1842   NITRITE NEGATIVE 10/16/2017 0857   LEUKOCYTESUR NEGATIVE 10/16/2017 0857    Radiological Exams on Admission: Dg Chest Port 1 View  Result Date: 10/16/2017 CLINICAL DATA:  Shortness of breath, fever, confusion EXAM: PORTABLE CHEST 1 VIEW COMPARISON:  06/20/2016 FINDINGS: Cardiomegaly with pulmonary vascular congestion. No frank interstitial edema. Mild linear scarring in the right mid lung. No  pleural effusion or pneumothorax. IMPRESSION: Cardiomegaly with pulmonary vascular congestion. No frank interstitial edema. Electronically Signed   By: Julian Hy M.D.   On: 10/16/2017 10:00    EKG: Not available for review.  Per EDP, no ST elevation or depression, normal T waves, rate of 102.  Narrow QRS.  Assessment/Plan Active Problems:   Weakness generalized   Dementia   Essential hypertension   Depression with anxiety   Acute bronchitis    1. Acute bronchitis.  Treat supportively with steroids, bronchodilators.  We will add a course of antibiotics.  Continue pulmonary hygiene. 2. Hypertension.  Blood pressure currently stable.  Continue outpatient regimen. 3. Depression and anxiety.  Continue onset she is somewhat tearful at this time. 4. Dementia.  Continue on Exelon patch.  DVT prophylaxis: lovenox Code Status: DNR Family Communication: discussed with family at the bedside Disposition Plan: discharge home once improved Consults called:  Admission status: observation, medsurg   Kathie Dike MD [facility] Pager [phone]  If 7PM-7AM, please contact night-coverage [email or web address] Password [facility]  10/16/2017, 5:33 PM

## 2017-10-16 NOTE — ED Notes (Signed)
Per Lauren RN on 300 room is not ready at this time, they are still cleaning room.

## 2017-10-16 NOTE — Progress Notes (Signed)
Patient had assited fall when going from restroom back to bed. Witnessed by Threasa Alpha, RN, and pt's sister and daughter. Took vitals, which are stable and pt had no visible injuries. I lowered pt to the floor to sit on her bottom and got assistance from nurse tech to get pt back in bed.  Notified Dr. Roderic Palau of fall. Will continue to monitor.

## 2017-10-17 DIAGNOSIS — F0391 Unspecified dementia with behavioral disturbance: Secondary | ICD-10-CM | POA: Diagnosis not present

## 2017-10-17 DIAGNOSIS — R5383 Other fatigue: Secondary | ICD-10-CM | POA: Diagnosis not present

## 2017-10-17 DIAGNOSIS — I5032 Chronic diastolic (congestive) heart failure: Secondary | ICD-10-CM

## 2017-10-17 DIAGNOSIS — J208 Acute bronchitis due to other specified organisms: Secondary | ICD-10-CM | POA: Diagnosis not present

## 2017-10-17 DIAGNOSIS — R531 Weakness: Secondary | ICD-10-CM

## 2017-10-17 DIAGNOSIS — R471 Dysarthria and anarthria: Secondary | ICD-10-CM | POA: Diagnosis not present

## 2017-10-17 DIAGNOSIS — J209 Acute bronchitis, unspecified: Secondary | ICD-10-CM | POA: Diagnosis not present

## 2017-10-17 DIAGNOSIS — R509 Fever, unspecified: Secondary | ICD-10-CM

## 2017-10-17 DIAGNOSIS — J441 Chronic obstructive pulmonary disease with (acute) exacerbation: Secondary | ICD-10-CM | POA: Diagnosis not present

## 2017-10-17 DIAGNOSIS — G9341 Metabolic encephalopathy: Secondary | ICD-10-CM | POA: Diagnosis not present

## 2017-10-17 DIAGNOSIS — R5381 Other malaise: Secondary | ICD-10-CM | POA: Diagnosis not present

## 2017-10-17 DIAGNOSIS — I10 Essential (primary) hypertension: Secondary | ICD-10-CM | POA: Diagnosis not present

## 2017-10-17 DIAGNOSIS — I509 Heart failure, unspecified: Secondary | ICD-10-CM | POA: Diagnosis not present

## 2017-10-17 LAB — RESPIRATORY PANEL BY PCR

## 2017-10-17 LAB — BASIC METABOLIC PANEL
Anion gap: 9 (ref 5–15)
BUN: 21 mg/dL — ABNORMAL HIGH (ref 6–20)
CO2: 22 mmol/L (ref 22–32)
Calcium: 8.7 mg/dL — ABNORMAL LOW (ref 8.9–10.3)
Chloride: 107 mmol/L (ref 101–111)
Creatinine, Ser: 0.86 mg/dL (ref 0.44–1.00)
GFR calc Af Amer: >60 mL/min (ref >60)
GFR calc non Af Amer: 55 mL/min — ABNORMAL LOW (ref >60)
Glucose, Bld: 185 mg/dL — ABNORMAL HIGH (ref 65–99)
Potassium: 4.6 mmol/L (ref 3.5–5.1)
Sodium: 138 mmol/L (ref 135–145)

## 2017-10-17 LAB — CBC
HCT: 34.5 % — ABNORMAL LOW (ref 36.0–46.0)
Hemoglobin: 10.6 g/dL — ABNORMAL LOW (ref 12.0–15.0)
MCH: 22.6 pg — ABNORMAL LOW (ref 26.0–34.0)
MCHC: 30.7 g/dL (ref 30.0–36.0)
MCV: 73.4 fL — ABNORMAL LOW (ref 78.0–100.0)
Platelets: 293 10*3/uL (ref 150–400)
RBC: 4.7 MIL/uL (ref 3.87–5.11)
RDW: 16.5 % — ABNORMAL HIGH (ref 11.5–15.5)
WBC: 11.3 10*3/uL — ABNORMAL HIGH (ref 4.0–10.5)

## 2017-10-17 LAB — T4, FREE: Free T4: 0.8 ng/dL (ref 0.61–1.12)

## 2017-10-17 LAB — PROCALCITONIN: Procalcitonin: <0.1 ng/mL

## 2017-10-17 LAB — VITAMIN B12: Vitamin B-12: 228 pg/mL (ref 180–914)

## 2017-10-17 LAB — TSH: TSH: 0.659 u[IU]/mL (ref 0.350–4.500)

## 2017-10-17 LAB — AMMONIA: Ammonia: 21 umol/L (ref 9–35)

## 2017-10-17 MED ORDER — IPRATROPIUM-ALBUTEROL 0.5-2.5 (3) MG/3ML IN SOLN
3.0000 mL | Freq: Four times a day (QID) | RESPIRATORY_TRACT | Status: DC
Start: 2017-10-17 — End: 2017-10-19
  Administered 2017-10-17 – 2017-10-19 (×5): 3 mL via RESPIRATORY_TRACT
  Filled 2017-10-17 (×6): qty 3

## 2017-10-17 MED ORDER — ALPRAZOLAM 0.25 MG PO TABS
0.2500 mg | ORAL_TABLET | Freq: Three times a day (TID) | ORAL | Status: DC
  Administered 2017-10-17 – 2017-10-19 (×6): 0.25 mg via ORAL
  Filled 2017-10-17 (×6): qty 1

## 2017-10-17 MED ORDER — IPRATROPIUM-ALBUTEROL 0.5-2.5 (3) MG/3ML IN SOLN: 3.0000 mL | Freq: Two times a day (BID) | RESPIRATORY_TRACT | Status: DC

## 2017-10-17 NOTE — Progress Notes (Signed)
PROGRESS NOTE  [name] XHB:[id number] DOB: 10/18/1919 DOA: 10/16/2017 PCP: Iona Beard, MD  Brief History:  84 year old female with a history of dementia, hypertension, anxiety, diastolic CHF presenting with 3-day history of shortness of breath, coughing, chest congestion, and wheezing.  The patient's daughter supplements the history.  The patient's daughter states that her home health aides have been suffering from URI type symptoms in the past week.  The patient began having symptoms described above with low-grade fevers on 10/14/2017.  In addition, the patient has had increasing generalized weakness, decreased oral intake, and increased somnolence.  There is been no reports of vomiting, diarrhea, uncontrolled pain, headaches.  Assessment/Plan: Acute metabolic encephalopathy -Patient remains confused and somnolent--not back to baseline -Secondary to infectious process -check TSH -check B12 -check ammonia -UA neg for pyuria  Fever -suspect respiratory source -influenza negative -viral respiratory panel -lactic acid 1.61 -PCT <0.10 -follow blood culture -personally reviewed CXR--no consolidation, increase interstitial markings  Acute Bronchitis -viral respiratory panel -influenza neg -continue Duonebs -Continue IV Solu-Medrol for now secondary to bronchospasm  Essential hypertension -Continue metoprolol tartrate  Dementia with behavioral disturbance -Continue Exelon patch -Continue Celexa -Decrease alprazolam     Disposition Plan:   Home in 1-2 days  Family Communication:  Daughter updated at bedside 1/30--Total time spent 35 minutes.  Greater than 50% spent face to face counseling and coordinating care.  Consultants:  None  Code Status:  DNR  DVT Prophylaxis: [place] Lovenox   Procedures: As Listed in Progress Note Above  Antibiotics: None    Subjective: Patient is somnolent but arouses to voice.  She is unable to provide any review of systems.   There is no reports of respiratory distress, uncontrolled pain, vomiting, diarrhea  Objective: Vitals:   10/16/17 2100 10/17/17 0109 10/17/17 0439 10/17/17 0725  BP: 124/63  (!) 151/66   Pulse: 92  82   Resp: 18  20   Temp: 98.1 F (36.7 C)  97.9 F (36.6 C)   TempSrc: Axillary  Axillary   SpO2:  97% 100% 98%  Weight:      Height:        Intake/Output Summary (Last 24 hours) at 10/17/2017 8101 Last data filed at 10/16/2017 2300 Gross per 24 hour  Intake 0 ml  Output -  Net 0 ml   Weight change:  Exam:   General:  Pt is alert,does not follow commands appropriately, not in acute distress  HEENT: No icterus, No thrush, No neck mass, [place]/AT  Cardiovascular: RRR, S1/S2, no rubs, no gallops  Respiratory: Bilateral rales.  Bilateral expiratory wheeze.  Good air movement.  Abdomen: Soft/+BS, non tender, non distended, no guarding  Extremities: No edema, No lymphangitis, No petechiae, No rashes, no synovitis   Data Reviewed: I have personally reviewed following labs and imaging studies Basic Metabolic Panel: Recent Labs  Lab 10/16/17 0914 10/17/17 0558  NA 139 138  K 4.7 4.6  CL 106 107  CO2 23 22  GLUCOSE 103* 185*  BUN 15 21*  CREATININE 0.89 0.86  CALCIUM 8.8* 8.7*   Liver Function Tests: Recent Labs  Lab 10/16/17 0914  AST 22  ALT 14  ALKPHOS 86  BILITOT 0.3  PROT 8.1  ALBUMIN 3.2*   No results for input(s): LIPASE, AMYLASE in the last 168 hours. No results for input(s): AMMONIA in the last 168 hours. Coagulation Profile: No results for input(s): INR, PROTIME in the last 168 hours. CBC:  Recent Labs  Lab 10/16/17 0914 10/17/17 0558  WBC 13.0* 11.3*  NEUTROABS 9.2*  --   HGB 10.7* 10.6*  HCT 35.7* 34.5*  MCV 75.3* 73.4*  PLT 292 293   Cardiac Enzymes: No results for input(s): CKTOTAL, CKMB, CKMBINDEX, TROPONINI in the last 168 hours. BNP: Invalid input(s): POCBNP CBG: No results for input(s): GLUCAP in the last 168 hours. HbA1C: No  results for input(s): HGBA1C in the last 72 hours. Urine analysis:    Component Value Date/Time   COLORURINE YELLOW 10/16/2017 [place] 10/16/2017 0857   LABSPEC 1.018 10/16/2017 0857   PHURINE 5.0 10/16/2017 0857   GLUCOSEU NEGATIVE 10/16/2017 0857   HGBUR SMALL (A) 10/16/2017 0857   BILIRUBINUR NEGATIVE 10/16/2017 0857   KETONESUR NEGATIVE 10/16/2017 0857   PROTEINUR NEGATIVE 10/16/2017 0857   UROBILINOGEN 0.2 01/06/2015 1842   NITRITE NEGATIVE 10/16/2017 0857   LEUKOCYTESUR NEGATIVE 10/16/2017 0857   Sepsis Labs: @LABRCNTIP (procalcitonin:4,lacticidven:4) ) Recent Results (from the past 240 hour(s))  Blood Culture (routine x 2)     Status: None (Preliminary result)   Collection Time: 10/16/17  9:14 AM  Result Value Ref Range Status   Specimen Description BLOOD RIGHT WRIST DRAWN BY RN  Final   Special Requests   Final    BOTTLES DRAWN AEROBIC ONLY Blood Culture results may not be optimal due to an inadequate volume of blood received in culture bottles   Culture NO GROWTH < 24 HOURS  Final   Report Status PENDING  Incomplete  Blood Culture (routine x 2)     Status: None (Preliminary result)   Collection Time: 10/16/17  9:14 AM  Result Value Ref Range Status   Specimen Description BLOOD LEFT HAND DRAWN BY RN  Final   Special Requests   Final    BOTTLES DRAWN AEROBIC ONLY Blood Culture adequate volume   Culture NO GROWTH < 24 HOURS  Final   Report Status PENDING  Incomplete     Scheduled Meds: . ALPRAZolam  0.5 mg Oral TID  . aspirin EC  81 mg Oral Daily  . benztropine  1 mg Oral Daily  . citalopram  20 mg Oral q morning - 10a  . doxycycline  100 mg Oral Q12H  . enoxaparin (LOVENOX) injection  40 mg Subcutaneous Q24H  . guaiFENesin  600 mg Oral BID  . ipratropium-albuterol  3 mL Nebulization BID  . methylPREDNISolone (SOLU-MEDROL) injection  60 mg Intravenous Q12H  . metoprolol tartrate  25 mg Oral BID  . rivastigmine  9.5 mg Transdermal Daily  .  sodium chloride flush  3 mL Intravenous Q12H   Continuous Infusions: . sodium chloride      Procedures/Studies: Dg Chest Port 1 View  Result Date: 10/16/2017 CLINICAL DATA:  Shortness of breath, fever, confusion EXAM: PORTABLE CHEST 1 VIEW COMPARISON:  06/20/2016 FINDINGS: Cardiomegaly with pulmonary vascular congestion. No frank interstitial edema. Mild linear scarring in the right mid lung. No pleural effusion or pneumothorax. IMPRESSION: Cardiomegaly with pulmonary vascular congestion. No frank interstitial edema. Electronically Signed   By: Julian Hy M.D.   On: 10/16/2017 10:00    Orson Eva, DO  [facility] Pager [phone]  If 7PM-7AM, please contact night-coverage [email or web address] Password [email or web address] 10/17/2017, 9:28 AM   LOS: 0 days

## 2017-10-17 NOTE — Progress Notes (Signed)
RN bladder scanned pt again d/t no output in purewick container and pt not wet. Bladder scan showed 212mL in bladder. Dr. Olevia Bowens paged and made aware. Waiting for call back/ orders.

## 2017-10-17 NOTE — Care Management Obs Status (Signed)
[facility] NOTIFICATION   Patient Details  Name: [name] MRN: [id number] Date of Birth: 03-27-1920   Medicare Observation Status Notification Given:  Yes(verbal signature from daughter, [name], patient has advanced dementia)    Duan Scharnhorst, Chauncey Reading, RN 10/17/2017, 10:41 AM

## 2017-10-17 NOTE — Progress Notes (Signed)
RN bladder scanned pt d/t not intake or output charted since admit today. 241mL in bladder. Will continue to monitor pt. Purewick in place and working correctly. Pt continues to not answer any questions or follow commands when prompted.

## 2017-10-17 NOTE — Care Management Note (Signed)
Case Management Note  Patient Details  Name: [name] MRN: [id number] Date of Birth: 16-Oct-1919  Subjective/Objective:  Adm with bronchitis. From home, advanced dementia. Lives with daughter. Has aides M-F 9-5, 9-4 Saturday, 9-5 Sunday. She does ambulate but will not use a RW, but uses assist from aides or family with gait belt. Dr. Berdine Addison is PCP, daughter transports her to appointments, uses WC.  She has a neb machine at home, acutely on oxygen. Has Medicare and Medicaid. Gets prescriptions filled at [facility] in [place].     Action/Plan: DC home in care of daughter. No CM needs known. Anticipate weaning to room.    Expected Discharge Date:      10/18/2017            Expected Discharge Plan:  Home/Self Care  In-House Referral:     Discharge planning Services  CM Consult  Post Acute Care Choice:  NA Choice offered to:  NA  DME Arranged:    DME Agency:     HH Arranged:    HH Agency:     Status of Service:  Completed, signed off  If discussed at [employer] of Stay Meetings, dates discussed:    Additional Comments:  [name], Samantha Reading, RN 10/17/2017, 10:28 AM

## 2017-10-18 ENCOUNTER — Observation Stay (HOSPITAL_COMMUNITY): Payer: Medicare Other

## 2017-10-18 DIAGNOSIS — I509 Heart failure, unspecified: Secondary | ICD-10-CM

## 2017-10-18 DIAGNOSIS — I10 Essential (primary) hypertension: Secondary | ICD-10-CM | POA: Diagnosis not present

## 2017-10-18 DIAGNOSIS — R5383 Other fatigue: Secondary | ICD-10-CM

## 2017-10-18 DIAGNOSIS — I5032 Chronic diastolic (congestive) heart failure: Secondary | ICD-10-CM | POA: Diagnosis not present

## 2017-10-18 DIAGNOSIS — J441 Chronic obstructive pulmonary disease with (acute) exacerbation: Secondary | ICD-10-CM | POA: Diagnosis not present

## 2017-10-18 DIAGNOSIS — R410 Disorientation, unspecified: Secondary | ICD-10-CM | POA: Diagnosis not present

## 2017-10-18 DIAGNOSIS — J208 Acute bronchitis due to other specified organisms: Secondary | ICD-10-CM | POA: Diagnosis not present

## 2017-10-18 DIAGNOSIS — R5381 Other malaise: Secondary | ICD-10-CM

## 2017-10-18 DIAGNOSIS — R471 Dysarthria and anarthria: Secondary | ICD-10-CM

## 2017-10-18 DIAGNOSIS — F0391 Unspecified dementia with behavioral disturbance: Secondary | ICD-10-CM | POA: Diagnosis not present

## 2017-10-18 DIAGNOSIS — G9341 Metabolic encephalopathy: Secondary | ICD-10-CM | POA: Diagnosis not present

## 2017-10-18 DIAGNOSIS — R479 Unspecified speech disturbances: Secondary | ICD-10-CM | POA: Diagnosis not present

## 2017-10-18 MED ORDER — PREDNISONE 20 MG PO TABS
40.0000 mg | ORAL_TABLET | Freq: Every day | ORAL | Status: DC
  Administered 2017-10-19: 40 mg via ORAL
  Filled 2017-10-18: qty 2

## 2017-10-18 MED ORDER — PREDNISONE 20 MG PO TABS: 40.0000 mg | ORAL_TABLET | Freq: Every day | ORAL | 0 refills | Status: DC

## 2017-10-18 NOTE — Plan of Care (Signed)
  Acute Rehab PT Goals(only PT should resolve) Pt Will Go Supine/Side To Sit 10/18/2017 1603 - Progressing by Lonell Grandchild, PT Flowsheets Taken 10/18/2017 1603  Pt will go Supine/Side to Sit with min guard assist Patient Will Transfer Sit To/From Stand 10/18/2017 1603 - Progressing by Lonell Grandchild, PT Flowsheets Taken 10/18/2017 1603  Patient will transfer sit to/from stand with minimal assist Pt Will Transfer Bed To Chair/Chair To Bed 10/18/2017 1603 - Progressing by Lonell Grandchild, PT Flowsheets Taken 10/18/2017 1603  Pt will Transfer Bed to Chair/Chair to Bed with min assist Pt Will Ambulate 10/18/2017 1603 - Progressing by Lonell Grandchild, PT Flowsheets Taken 10/18/2017 1603  Pt will Ambulate with moderate assist;with +2  4:04 PM, 10/18/17 Lonell Grandchild, MPT Physical Therapist with [facility] [phone] office [phone] mobile phone

## 2017-10-18 NOTE — Evaluation (Signed)
Physical Therapy Evaluation Patient Details Name: [name] MRN: [id number] DOB: Sep 15, 1920 Today's Date: 10/18/2017   History of Present Illness  [name] is a 84 y.o. female with medical history significant of dementia, hypertension, anxiety, presents to the hospital with shortness of breath.  Patient's family reports that since Sunday, she had worsening shortness of breath, wheezing and mild cough.  Apparently, 1 of her nursing aides had a viral syndrome.  They see the patient has been feverish at home.  She has not had any vomiting or diarrhea.  No dysuria.    Clinical Impression  Patient able to follow instructions with family assisting, able to take a few steps with 2 person assist during transfer to chair, near baseline per family and feels comfortable taking her home when discharged.  Patient will benefit from continued physical therapy in hospital and recommended venue below to increase strength, balance, endurance for safe ADLs and gait.    Follow Up Recommendations Home health PT;Supervision/Assistance - 24 hour    Equipment Recommendations  None recommended by PT    Recommendations for Other Services       Precautions / Restrictions Precautions Precautions: Fall Restrictions Weight Bearing Restrictions: No      Mobility  Bed Mobility Overal bed mobility: Needs Assistance Bed Mobility: Supine to Sit;Sit to Supine     Supine to sit: Mod assist Sit to supine: Mod assist      Transfers Overall transfer level: Needs assistance Equipment used: 2 person hand held assist Transfers: Sit to/from [employer] Transfers Sit to Stand: Max assist Stand pivot transfers: Max assist       General transfer comment: 2 person assist holding arms under axillary area   Ambulation/Gait Ambulation/Gait assistance: Max assist Ambulation Distance (Feet): 3 Feet Assistive device: 2 person hand held assist     Gait velocity interpretation: Below normal speed for  age/gender General Gait Details: 5-6 steps during transfer to chair, limited seconary to fatigue  Stairs            Wheelchair Mobility    Modified Rankin (Stroke Patients Only)       Balance Overall balance assessment: Needs assistance Sitting-balance support: Feet supported;Bilateral upper extremity supported Sitting balance-Leahy Scale: Fair     Standing balance support: Bilateral upper extremity supported;During functional activity Standing balance-Leahy Scale: Poor                               Pertinent Vitals/Pain Pain Assessment: Faces Pain Score: 0-No pain    Home Living Family/patient expects to be discharged to:: Private residence Living Arrangements: Children Available Help at Discharge: Family;Available 24 hours/day;Personal care attendant(home aides 7 days/week) Type of Home: House Home Access: Stairs to enter Entrance Stairs-Rails: Right;Left;Can reach both Entrance Stairs-Number of Steps: 3 Home Layout: One level Home Equipment: Walker - 2 wheels;Wheelchair - manual;Walker - 4 wheels;Transport chair;Cane - single point;Shower seat      Prior Function Level of Independence: Needs assistance   Gait / Transfers Assistance Needed: assisted for short distanced household gait  ADL's / Homemaking Assistance Needed: home aide 7 days/week        Hand Dominance        Extremity/Trunk Assessment   Upper Extremity Assessment Upper Extremity Assessment: Generalized weakness    Lower Extremity Assessment Lower Extremity Assessment: Generalized weakness    Cervical / Trunk Assessment Cervical / Trunk Assessment: Kyphotic  Communication   Communication: Other (comment)(patient  has altered mental status per family)  Cognition Arousal/Alertness: Awake/alert Behavior During Therapy: WFL for tasks assessed/performed Overall Cognitive Status: Impaired/Different from baseline Area of Impairment: Following commands                        Following Commands: Follows one step commands with increased time              General Comments      Exercises     Assessment/Plan    PT Assessment Patient needs continued PT services  PT Problem List Decreased strength;Decreased activity tolerance;Decreased balance;Decreased mobility       PT Treatment Interventions Gait training;Functional mobility training;Therapeutic activities;Therapeutic exercise    PT Goals (Current goals can be found in the Care Plan section)  Acute Rehab PT Goals Patient Stated Goal: return home PT Goal Formulation: With patient/family Time For Goal Achievement: 10/22/17 Potential to Achieve Goals: Good    Frequency Min 3X/week   Barriers to discharge        Co-evaluation               AM-PAC PT "6 Clicks" Daily Activity  Outcome Measure Difficulty turning over in bed (including adjusting bedclothes, sheets and blankets)?: A Lot Difficulty moving from lying on back to sitting on the side of the bed? : A Lot Difficulty sitting down on and standing up from a chair with arms (e.g., wheelchair, bedside commode, etc,.)?: A Lot Help needed moving to and from a bed to chair (including a wheelchair)?: A Lot Help needed walking in hospital room?: A Lot Help needed climbing 3-5 steps with a railing? : A Lot 6 Click Score: 12    End of Session   Activity Tolerance: Patient limited by fatigue;Patient tolerated treatment well Patient left: in chair;with call bell/phone within reach;with family/visitor present Nurse Communication: Mobility status PT Visit Diagnosis: Unsteadiness on feet (R26.81);Other abnormalities of gait and mobility (R26.89);Muscle weakness (generalized) (M62.81)    Time: 7416-3845 PT Time Calculation (min) (ACUTE ONLY): 30 min   Charges:   PT Evaluation $PT Eval Moderate Complexity: 1 Mod PT Treatments $Therapeutic Activity: 23-37 mins   PT G Codes:        4:02 PM, October 21, 2017 Lonell Grandchild,  MPT Physical Therapist with [facility] [phone] office [phone] mobile phone

## 2017-10-18 NOTE — Evaluation (Signed)
Clinical/Bedside Swallow Evaluation Patient Details  Name: [name] MRN: [id number] Date of Birth: [birth year]  Today's Date: 10/18/2017 Time: SLP Start Time (ACUTE ONLY): 1656 SLP Stop Time (ACUTE ONLY): 1718 SLP Time Calculation (min) (ACUTE ONLY): 22 min  Past Medical History:  Past Medical History:  Diagnosis Date  . Alzheimer's dementia   . Cancer (HCC)    breast  . CHF (congestive heart failure) ([place])   . Heart attack ([place])   . Hypertension   . Parkinson disease [facility])    Past Surgical History:  Past Surgical History:  Procedure Laterality Date  . ABDOMINAL HYSTERECTOMY    . arm surgery    . BREAST SURGERY    . CHOLECYSTECTOMY    . COLON SURGERY    . HIP PINNING,CANNULATED Right 02/01/2013   Procedure: CANNULATED HIP PINNING;  Surgeon: Sanjuana Kava, MD;  Location: [facility];  Service: Orthopedics;  Laterality: Right;  . LEG SURGERY    . MASTECTOMY    . PELVIC FRACTURE SURGERY    . small intestine removed    . TONSILLECTOMY     HPI:  [name] is a 84 y.o. female with medical history significant of dementia, hypertension, anxiety, presents to the hospital with shortness of breath.  Patient's family reports that since Sunday, she had worsening shortness of breath, wheezing and mild cough.  Apparently, 1 of her nursing aides had a viral syndrome.  They see the patient has been feverish at home.  She has not had any vomiting or diarrhea.  No dysuria.   Assessment / Plan / Recommendation Clinical Impression  Pt was evaluated at bedside; Nurse tech assisted SLP in repositioning pt in bed. Pt was emotional and despite max verbal cues did not verbally respond to SLP. She was tearful in between trials. Pt's daughter who is also the caregiver reports no coughing or difficulty with swallowing. Note pt has missing dentition but adequate natural dentition for mastication of soft solids. Pt consumed mech soft, puree and thin liquids demonstrating no overt s/sx of aspiration with any  textures/consistencies presented. Recommend continue with D3/mech soft diet and thin liquids. There are no further ST needs at this time. ST to sign off. SLP Visit Diagnosis: Dysphagia, unspecified (R13.10)    Aspiration Risk  Mild aspiration risk    Diet Recommendation Dysphagia 3 (Mech soft);Thin liquid   Liquid Administration via: Cup;Straw Medication Administration: Crushed with puree Supervision: Patient able to self feed Compensations: Minimize environmental distractions;Small sips/bites;Follow solids with liquid Postural Changes: Seated upright at 90 degrees;Remain upright for at least 30 minutes after po intake    Other  Recommendations Oral Care Recommendations: Oral care BID   Follow up Recommendations 24 hour supervision/assistance      Frequency and Duration            Prognosis        Swallow Study   General HPI: [name] is a 84 y.o. female with medical history significant of dementia, hypertension, anxiety, presents to the hospital with shortness of breath.  Patient's family reports that since Sunday, she had worsening shortness of breath, wheezing and mild cough.  Apparently, 1 of her nursing aides had a viral syndrome.  They see the patient has been feverish at home.  She has not had any vomiting or diarrhea.  No dysuria. Type of Study: Bedside Swallow Evaluation Previous Swallow Assessment: BSE 2015 Diet Prior to this Study: Dysphagia 3 (soft);Thin liquids Temperature Spikes Noted: No History of Recent Intubation:  No Behavior/Cognition: Alert;Confused;Distractible;Requires cueing;Doesn't follow directions Oral Cavity Assessment: Within Functional Limits;Dry Oral Care Completed by SLP: Recent completion by staff Oral Cavity - Dentition: Adequate natural dentition;Missing dentition Vision: Impaired for self-feeding Self-Feeding Abilities: Total assist Patient Positioning: Upright in bed Baseline Vocal Quality: Not observed Volitional Cough: Cognitively  unable to elicit Volitional Swallow: Unable to elicit    Oral/Motor/Sensory Function Overall Oral Motor/Sensory Function: Within functional limits   Ice Chips     Thin Liquid Thin Liquid: Within functional limits    Nectar Thick     Honey Thick     Puree Puree: Within functional limits   Solid   GO   Solid: Within functional limits    Functional Assessment Tool Used: clinical judgement Functional Limitations: Swallowing Swallow Current Status (W5809): At least 1 percent but less than 20 percent impaired, limited or restricted Swallow Goal Status [phone]): At least 1 percent but less than 20 percent impaired, limited or restricted Swallow Discharge Status [phone]): At least 1 percent but less than 20 percent impaired, limited or restricted     Amelia H. Roddie Mc, CCC-SLP Speech Language Pathologist  Wende Bushy 10/18/2017,5:24 PM

## 2017-10-18 NOTE — Progress Notes (Signed)
PROGRESS NOTE  [name] BJS:[id number] DOB: [birth year] DOA: 10/16/2017 PCP: Iona Beard, MD  Brief History:  84 year old female with a history of dementia, hypertension, anxiety, diastolic CHF presenting with 3-day history of shortness of breath, coughing, chest congestion, and wheezing.  The patient's daughter supplements the history.  The patient's daughter states that her home health aides have been suffering from URI type symptoms in the past week.  The patient began having symptoms described above with low-grade fevers on 10/14/2017.  In addition, the patient has had increasing generalized weakness, decreased oral intake, and increased somnolence.  There is been no reports of vomiting, diarrhea, uncontrolled pain, headaches.  Assessment/Plan: Acute metabolic encephalopathy -Secondary to infectious process -check TSH--0.659; Free T4 0.80 -check B12--228 -check ammonia--21 -UA neg for pyuria -more alert today on 1/31, but continues to have difficult to understand speech  Dysarthria -sister claims pt had aucte episode of dysarthria around 1245 on 1/31 -daughter claims pt has had intermittent dysarthria throughout the entire hospitalization, and states it is better today than 1/30 -pt has had very difficult to comprehend speech throughout entire hospitalization upon my assessment -MRI brain -speech therapy eval -hold discharge today  Fever -suspect respiratory source -influenza negative -viral respiratory panel--neg -lactic acid 1.61 -PCT <0.10 -follow blood culture--neg -personally reviewed CXR--no consolidation, increase interstitial markings -resolved  Acute Bronchitis -viral respiratory panel--neg -influenza neg -continue Duonebs -Continue IV Solu-Medrol for now secondary to bronchospasm>>>prednisone  Essential hypertension -Continue metoprolol tartrate  Dementia with behavioral disturbance -Continue Exelon patch -Continue Celexa -Decrease  alprazolam     Disposition Plan:   Home 2/1 if stable Family Communication:  Daughter updated at bedside 1/31--Total time spent 35 minutes.  Greater than 50% spent face to face counseling and coordinating care.  Consultants:  None  Code Status:  DNR  DVT Prophylaxis: [place] Lovenox   Procedures: As Listed in Progress Note Above  Antibiotics: None      Subjective: Review of systems very limited secondary to patient's advanced dementia.  No reports of vomiting, diarrhea, uncontrolled pain, respiratory distress  Objective: Vitals:   10/17/17 2214 10/18/17 0630 10/18/17 0809 10/18/17 1220  BP: (!) 174/126 120/85  120/90  Pulse:    68  Resp: 20 16    Temp: 98.8 F (37.1 C) 98.5 F (36.9 C)    TempSrc: Axillary Axillary    SpO2: 100% 100% 98% 94%  Weight:      Height:        Intake/Output Summary (Last 24 hours) at 10/18/2017 1253 Last data filed at 10/18/2017 0657 Gross per 24 hour  Intake 120 ml  Output 850 ml  Net -730 ml   Weight change:  Exam:   General:  Pt is alert, follows commands appropriately, not in acute distress  HEENT: No icterus, No thrush, No neck mass, [place]/AT  Cardiovascular: RRR, S1/S2, no rubs, no gallops  Respiratory: Poor respiratory effort, no wheezing.  Bibasilar rales.  Abdomen: Soft/+BS, non tender, non distended, no guarding  Extremities: No edema, No lymphangitis, No petechiae, No rashes, no synovitis  Neuro:  Pt moves all extremities anti-gravity;  sensation intact bilateral; babinski equivocal     Data Reviewed: I have personally reviewed following labs and imaging studies Basic Metabolic Panel: Recent Labs  Lab 10/16/17 0914 10/17/17 0558  NA 139 138  K 4.7 4.6  CL 106 107  CO2 23 22  GLUCOSE 103* 185*  BUN 15 21*  CREATININE 0.89 0.86  CALCIUM 8.8* 8.7*   Liver Function Tests: Recent Labs  Lab 10/16/17 0914  AST 22  ALT 14  ALKPHOS 86  BILITOT 0.3  PROT 8.1  ALBUMIN 3.2*   No results for  input(s): LIPASE, AMYLASE in the last 168 hours. Recent Labs  Lab 10/17/17 1000  AMMONIA 21   Coagulation Profile: No results for input(s): INR, PROTIME in the last 168 hours. CBC: Recent Labs  Lab 10/16/17 0914 10/17/17 0558  WBC 13.0* 11.3*  NEUTROABS 9.2*  --   HGB 10.7* 10.6*  HCT 35.7* 34.5*  MCV 75.3* 73.4*  PLT 292 293   Cardiac Enzymes: No results for input(s): CKTOTAL, CKMB, CKMBINDEX, TROPONINI in the last 168 hours. BNP: Invalid input(s): POCBNP CBG: No results for input(s): GLUCAP in the last 168 hours. HbA1C: No results for input(s): HGBA1C in the last 72 hours. Urine analysis:    Component Value Date/Time   COLORURINE YELLOW 10/16/2017 [place] 10/16/2017 0857   LABSPEC 1.018 10/16/2017 0857   PHURINE 5.0 10/16/2017 0857   GLUCOSEU NEGATIVE 10/16/2017 0857   HGBUR SMALL (A) 10/16/2017 0857   BILIRUBINUR NEGATIVE 10/16/2017 0857   KETONESUR NEGATIVE 10/16/2017 0857   PROTEINUR NEGATIVE 10/16/2017 0857   UROBILINOGEN 0.2 01/06/2015 1842   NITRITE NEGATIVE 10/16/2017 0857   LEUKOCYTESUR NEGATIVE 10/16/2017 0857   Sepsis Labs: @LABRCNTIP (procalcitonin:4,lacticidven:4) ) Recent Results (from the past 240 hour(s))  Blood Culture (routine x 2)     Status: None (Preliminary result)   Collection Time: 10/16/17  9:14 AM  Result Value Ref Range Status   Specimen Description BLOOD RIGHT WRIST DRAWN BY RN  Final   Special Requests   Final    BOTTLES DRAWN AEROBIC ONLY Blood Culture results may not be optimal due to an inadequate volume of blood received in culture bottles   Culture NO GROWTH 2 DAYS  Final   Report Status PENDING  Incomplete  Blood Culture (routine x 2)     Status: None (Preliminary result)   Collection Time: 10/16/17  9:14 AM  Result Value Ref Range Status   Specimen Description BLOOD LEFT HAND DRAWN BY RN  Final   Special Requests   Final    BOTTLES DRAWN AEROBIC ONLY Blood Culture adequate volume   Culture NO GROWTH 2  DAYS  Final   Report Status PENDING  Incomplete  Respiratory Panel by PCR     Status: None   Collection Time: 10/17/17  9:27 AM  Result Value Ref Range Status   Adenovirus NOT DETECTED NOT DETECTED Final   Coronavirus 229E NOT DETECTED NOT DETECTED Final   Coronavirus HKU1 NOT DETECTED NOT DETECTED Final   Coronavirus NL63 NOT DETECTED NOT DETECTED Final   Coronavirus OC43 NOT DETECTED NOT DETECTED Final   Metapneumovirus NOT DETECTED NOT DETECTED Final   Rhinovirus / Enterovirus NOT DETECTED NOT DETECTED Final   Influenza A NOT DETECTED NOT DETECTED Final   Influenza B NOT DETECTED NOT DETECTED Final   Parainfluenza Virus 1 NOT DETECTED NOT DETECTED Final   Parainfluenza Virus 2 NOT DETECTED NOT DETECTED Final   Parainfluenza Virus 3 NOT DETECTED NOT DETECTED Final   Parainfluenza Virus 4 NOT DETECTED NOT DETECTED Final   Respiratory Syncytial Virus NOT DETECTED NOT DETECTED Final   Bordetella pertussis NOT DETECTED NOT DETECTED Final   Chlamydophila pneumoniae NOT DETECTED NOT DETECTED Final   Mycoplasma pneumoniae NOT DETECTED NOT DETECTED Final    Comment: Performed at [facility], [address], [place], [place] [address]     Scheduled Meds: . ALPRAZolam  0.25 mg Oral TID  . aspirin EC  81 mg Oral Daily  . benztropine  1 mg Oral Daily  . citalopram  20 mg Oral q morning - 10a  . enoxaparin (LOVENOX) injection  40 mg Subcutaneous Q24H  . guaiFENesin  600 mg Oral BID  . ipratropium-albuterol  3 mL Nebulization Q6H  . metoprolol tartrate  25 mg Oral BID  . [START ON 10/19/2017] predniSONE  40 mg Oral Q breakfast  . rivastigmine  9.5 mg Transdermal Daily  . sodium chloride flush  3 mL Intravenous Q12H   Continuous Infusions: . sodium chloride      Procedures/Studies: Dg Chest Port 1 View  Result Date: 10/16/2017 CLINICAL DATA:  Shortness of breath, fever, confusion EXAM: PORTABLE CHEST 1 VIEW COMPARISON:  06/20/2016 FINDINGS: Cardiomegaly with pulmonary vascular  congestion. No frank interstitial edema. Mild linear scarring in the right mid lung. No pleural effusion or pneumothorax. IMPRESSION: Cardiomegaly with pulmonary vascular congestion. No frank interstitial edema. Electronically Signed   By: Julian Hy M.D.   On: 10/16/2017 10:00    Orson Eva, DO  [facility] Pager [phone]  If 7PM-7AM, please contact night-coverage [email or web address] Password [email or web address] 10/18/2017, 12:53 PM   LOS: 0 days

## 2017-10-19 DIAGNOSIS — J209 Acute bronchitis, unspecified: Secondary | ICD-10-CM | POA: Diagnosis not present

## 2017-10-19 DIAGNOSIS — R279 Unspecified lack of coordination: Secondary | ICD-10-CM | POA: Diagnosis not present

## 2017-10-19 DIAGNOSIS — Z7401 Bed confinement status: Secondary | ICD-10-CM | POA: Diagnosis not present

## 2017-10-19 DIAGNOSIS — J208 Acute bronchitis due to other specified organisms: Secondary | ICD-10-CM | POA: Diagnosis not present

## 2017-10-19 DIAGNOSIS — J441 Chronic obstructive pulmonary disease with (acute) exacerbation: Secondary | ICD-10-CM | POA: Diagnosis not present

## 2017-10-19 DIAGNOSIS — I5032 Chronic diastolic (congestive) heart failure: Secondary | ICD-10-CM | POA: Diagnosis not present

## 2017-10-19 DIAGNOSIS — G9341 Metabolic encephalopathy: Secondary | ICD-10-CM | POA: Diagnosis not present

## 2017-10-19 DIAGNOSIS — F0391 Unspecified dementia with behavioral disturbance: Secondary | ICD-10-CM | POA: Diagnosis not present

## 2017-10-19 LAB — CBC WITH DIFFERENTIAL/PLATELET
Basophils Absolute: 0 10*3/uL (ref 0.0–0.1)
Basophils Relative: 0 %
Eosinophils Absolute: 0 10*3/uL (ref 0.0–0.7)
Eosinophils Relative: 0 %
HCT: 33.5 % — ABNORMAL LOW (ref 36.0–46.0)
Hemoglobin: 10.2 g/dL — ABNORMAL LOW (ref 12.0–15.0)
Lymphocytes Relative: 12 %
Lymphs Abs: 1.9 10*3/uL (ref 0.7–4.0)
MCH: 22.7 pg — ABNORMAL LOW (ref 26.0–34.0)
MCHC: 30.4 g/dL (ref 30.0–36.0)
MCV: 74.6 fL — ABNORMAL LOW (ref 78.0–100.0)
Monocytes Absolute: 0.5 10*3/uL (ref 0.1–1.0)
Monocytes Relative: 3 %
Neutro Abs: 13.6 10*3/uL — ABNORMAL HIGH (ref 1.7–7.7)
Neutrophils Relative %: 85 %
Platelets: 362 10*3/uL (ref 150–400)
RBC: 4.49 MIL/uL (ref 3.87–5.11)
RDW: 16.7 % — ABNORMAL HIGH (ref 11.5–15.5)
WBC: 16 10*3/uL — ABNORMAL HIGH (ref 4.0–10.5)

## 2017-10-19 LAB — BASIC METABOLIC PANEL
Anion gap: 8 (ref 5–15)
BUN: 38 mg/dL — ABNORMAL HIGH (ref 6–20)
CO2: 26 mmol/L (ref 22–32)
Calcium: 8.5 mg/dL — ABNORMAL LOW (ref 8.9–10.3)
Chloride: 109 mmol/L (ref 101–111)
Creatinine, Ser: 0.83 mg/dL (ref 0.44–1.00)
GFR calc Af Amer: >60 mL/min (ref >60)
GFR calc non Af Amer: 58 mL/min — ABNORMAL LOW (ref >60)
Glucose, Bld: 116 mg/dL — ABNORMAL HIGH (ref 65–99)
Potassium: 3.8 mmol/L (ref 3.5–5.1)
Sodium: 143 mmol/L (ref 135–145)

## 2017-10-19 LAB — MAGNESIUM: Magnesium: 2.4 mg/dL (ref 1.7–2.4)

## 2017-10-19 MED ORDER — IPRATROPIUM-ALBUTEROL 0.5-2.5 (3) MG/3ML IN SOLN: 3.0000 mL | Freq: Two times a day (BID) | RESPIRATORY_TRACT | Status: DC

## 2017-10-19 NOTE — Discharge Summary (Signed)
Physician Discharge Summary  [name] RCV:[id number] DOB: [birth year] DOA: 10/16/2017  PCP: Iona Beard, MD  Admit date: 10/16/2017 Discharge date: 10/19/2017  Admitted From: Home Disposition:  Home  Recommendations for Outpatient Follow-up:  1. Follow up with PCP in 1-2 weeks 2. Please obtain BMP/CBC in one week      Discharge Condition: Stable CODE STATUS:DNR Diet recommendation: dysphagia 3 with thin   Brief/Interim Summary: 84 year old female with a history of dementia, hypertension, anxiety, diastolic CHF presenting with 3-day history of shortness of breath, coughing, chest congestion, and wheezing. The patient's daughter supplements the history. The patient's daughter states that her home health aides have been suffering from URI type symptoms in the past week. The patient began having symptoms described above with low-grade fevers on 10/14/2017. In addition, the patient has had increasing generalized weakness, decreased oral intake, and increased somnolence. There is been no reports of vomiting, diarrhea, uncontrolled pain, headaches.    Discharge Diagnoses:  Acute metabolic encephalopathy -Secondary to infectious process -check TSH--0.659; Free T4 0.80 -check B12--228 -check ammonia--21 -UA neg for pyuria -more alert today on 1/31, but continues to have difficult to understand speech -On 10/19/2017, the patient continues to have dysarthria and difficult to understand speech which has been chronic throughout the hospitalization upon my assessment--however, daughter at the bedside feels that her speech was better -10/19/2017--daughter states overall mental status not back fully to baseline but much improved  Dysarthria -sister claims pt had aucte episode of dysarthria around 1245 on 1/31 -daughter claims pt has had intermittent dysarthria throughout the entire hospitalization, and states it is better today than 1/30 -pt has had very difficult to comprehend speech  throughout entire hospitalization upon my assessment -MRI brain--no acute findings -CT brain--no acute findings -speech therapy eval--dys3 with thin  Fever -suspect respiratory source -influenza negative -viral respiratory panel--neg -lactic acid 1.61 -PCT <0.10 -follow blood culture--neg -personally reviewed CXR--no consolidation, increase interstitial markings -resolved  Acute Bronchitis -viral respiratory panel--neg -influenza neg -continue Duonebs -Continue IV Solu-Medrol for now secondary to bronchospasm>>>prednisone home with prednisone x 2 more days to complete 5 days of tx  Essential hypertension -Continue metoprolol tartrate  Dementia with behavioral disturbance -Continue Exelon patch -Continue Celexa -Decrease alprazolam      Discharge Instructions  Discharge Instructions    Diet - low sodium heart healthy   Complete by:  As directed    Increase activity slowly   Complete by:  As directed    Increase activity slowly   Complete by:  As directed      Allergies as of 10/19/2017      Reactions   Adhesive [tape] Rash   Ensure Pudding [nutritional Supplements] Swelling, Rash   Throat swelling   Penicillins Swelling, Rash   Has patient had a PCN reaction causing immediate rash, facial/tongue/throat swelling, SOB or lightheadedness with hypotension: Yes Has patient had a PCN reaction causing severe rash involving mucus membranes or skin necrosis: Yes Has patient had a PCN reaction that required hospitalization No Has patient had a PCN reaction occurring within the last 10 years: No If all of the above answers are "NO", then may proceed with Cephalosporin use.      Medication List    TAKE these medications   acetaminophen 500 MG tablet Commonly known as:  TYLENOL Take 500 mg by mouth at bedtime. For pain   albuterol (2.5 MG/3ML) 0.083% nebulizer solution Commonly known as:  PROVENTIL Take 2.5 mg by nebulization 4 (four) times daily.   ALPRAZolam  0.5 MG tablet Commonly known as:  XANAX Take 0.5 mg by mouth 3 (three) times daily.   aspirin EC 81 MG tablet Take 1 tablet (81 mg total) by mouth daily.   benztropine 1 MG tablet Commonly known as:  COGENTIN Take 1 tablet (1 mg total) by mouth daily.   citalopram 20 MG tablet Commonly known as:  CELEXA Take 1 tablet (20 mg total) by mouth every morning.   guaifenesin 100 MG/5ML syrup Commonly known as:  ROBITUSSIN Take 200 mg by mouth 3 (three) times daily as needed for cough.   HYDROcodone-acetaminophen 5-325 MG tablet Commonly known as:  NORCO/VICODIN Take 1 tablet by mouth every 6 (six) hours as needed.   metoprolol tartrate 50 MG tablet Commonly known as:  LOPRESSOR Take 0.5 tablets (25 mg total) by mouth 2 (two) times daily.   predniSONE 20 MG tablet Commonly known as:  DELTASONE Take 2 tablets (40 mg total) by mouth daily with breakfast.   rivastigmine 9.5 mg/24hr Commonly known as:  EXELON Place 1 patch (9.5 mg total) onto the skin daily. *Remove and discard used patches*   zolpidem 10 MG tablet Commonly known as:  AMBIEN Take 10 mg by mouth at bedtime as needed for sleep.       Allergies  Allergen Reactions  . Adhesive [Tape] Rash  . Ensure Pudding [Nutritional Supplements] Swelling and Rash    Throat swelling  . Penicillins Swelling and Rash    Has patient had a PCN reaction causing immediate rash, facial/tongue/throat swelling, SOB or lightheadedness with hypotension: Yes Has patient had a PCN reaction causing severe rash involving mucus membranes or skin necrosis: Yes Has patient had a PCN reaction that required hospitalization No Has patient had a PCN reaction occurring within the last 10 years: No If all of the above answers are "NO", then may proceed with Cephalosporin use.    Consultations:  none   Procedures/Studies: Ct Head Wo Contrast  Result Date: 10/18/2017 CLINICAL DATA:  Speech difficulty. EXAM: CT HEAD WITHOUT CONTRAST TECHNIQUE:  Contiguous axial images were obtained from the base of the skull through the vertex without intravenous contrast. COMPARISON:  CT scan of June 20, 2016. FINDINGS: Brain: Mild diffuse cortical atrophy is noted. No mass effect or midline shift is noted. Ventricular size is within normal limits. There is no evidence of mass lesion, hemorrhage or acute infarction. Vascular: No hyperdense vessel or unexpected calcification. Skull: Normal. Negative for fracture or focal lesion. Sinuses/Orbits: Bilateral ethmoid and right maxillary sinusitis is noted. Other: None. IMPRESSION: Mild diffuse cortical atrophy. No acute intracranial abnormality seen. Electronically Signed   By: Marijo Conception, M.D.   On: 10/18/2017 15:03   Mr Brain Wo Contrast  Result Date: 10/18/2017 CLINICAL DATA:  Confusion beginning 4 days ago.  Fever.  Dementia. EXAM: MRI HEAD WITHOUT CONTRAST TECHNIQUE: Multiplanar, multiecho pulse sequences of the brain and surrounding structures were obtained without intravenous contrast. COMPARISON:  CT same day. FINDINGS: Brain: Study suffers from motion degradation. Diffusion imaging does not show any acute or subacute infarction. Brainstem and cerebellum are unremarkable. Cerebral hemispheres show generalized atrophy with mild small vessel change in the white matter. No cortical or large vessel territory infarction. Intra-axial mass lesion, hemorrhage, hydrocephalus or extra-axial collection. Sessile 1 cm meningioma at the right posterior parietal convexity, not clinically significant. Vascular: Major vessels at the base of the brain show flow. Skull and upper cervical spine: Negative Sinuses/Orbits: Mucosal inflammatory changes of the sinuses. Orbits negative. Other: None IMPRESSION:  No acute intracranial finding. Cerebral hemispheric atrophy. Minimal small vessel change. Insignificant sessile 1 cm meningioma right posterior parietal convexity. Sinus mucosal inflammatory changes. Electronically Signed   By:  Nelson Chimes M.D.   On: 10/18/2017 14:48   Dg Chest Port 1 View  Result Date: 10/16/2017 CLINICAL DATA:  Shortness of breath, fever, confusion EXAM: PORTABLE CHEST 1 VIEW COMPARISON:  06/20/2016 FINDINGS: Cardiomegaly with pulmonary vascular congestion. No frank interstitial edema. Mild linear scarring in the right mid lung. No pleural effusion or pneumothorax. IMPRESSION: Cardiomegaly with pulmonary vascular congestion. No frank interstitial edema. Electronically Signed   By: Julian Hy M.D.   On: 10/16/2017 10:00        Discharge Exam: Vitals:   10/19/17 0525 10/19/17 0751  BP: 120/86   Pulse: 64   Resp: 18   Temp: 98.5 F (36.9 C)   SpO2: 100% (!) 89%   Vitals:   10/18/17 1936 10/18/17 2100 10/19/17 0525 10/19/17 0751  BP:  (!) 142/54 120/86   Pulse: 68 66 64   Resp: 16 16 18    Temp:  97.8 F (36.6 C) 98.5 F (36.9 C)   TempSrc:  Axillary Axillary   SpO2: 93% 100% 100% (!) 89%  Weight:      Height:        General: Pt is alert, awake, not in acute distress Cardiovascular: RRR, S1/S2 +, no rubs, no gallops Respiratory: CTA bilaterally, no wheezing, no rhonchi Abdominal: Soft, NT, ND, bowel sounds + Extremities: no edema, no cyanosis   The results of significant diagnostics from this hospitalization (including imaging, microbiology, ancillary and laboratory) are listed below for reference.    Significant Diagnostic Studies: Ct Head Wo Contrast  Result Date: 10/18/2017 CLINICAL DATA:  Speech difficulty. EXAM: CT HEAD WITHOUT CONTRAST TECHNIQUE: Contiguous axial images were obtained from the base of the skull through the vertex without intravenous contrast. COMPARISON:  CT scan of June 20, 2016. FINDINGS: Brain: Mild diffuse cortical atrophy is noted. No mass effect or midline shift is noted. Ventricular size is within normal limits. There is no evidence of mass lesion, hemorrhage or acute infarction. Vascular: No hyperdense vessel or unexpected calcification.  Skull: Normal. Negative for fracture or focal lesion. Sinuses/Orbits: Bilateral ethmoid and right maxillary sinusitis is noted. Other: None. IMPRESSION: Mild diffuse cortical atrophy. No acute intracranial abnormality seen. Electronically Signed   By: Marijo Conception, M.D.   On: 10/18/2017 15:03   Mr Brain Wo Contrast  Result Date: 10/18/2017 CLINICAL DATA:  Confusion beginning 4 days ago.  Fever.  Dementia. EXAM: MRI HEAD WITHOUT CONTRAST TECHNIQUE: Multiplanar, multiecho pulse sequences of the brain and surrounding structures were obtained without intravenous contrast. COMPARISON:  CT same day. FINDINGS: Brain: Study suffers from motion degradation. Diffusion imaging does not show any acute or subacute infarction. Brainstem and cerebellum are unremarkable. Cerebral hemispheres show generalized atrophy with mild small vessel change in the white matter. No cortical or large vessel territory infarction. Intra-axial mass lesion, hemorrhage, hydrocephalus or extra-axial collection. Sessile 1 cm meningioma at the right posterior parietal convexity, not clinically significant. Vascular: Major vessels at the base of the brain show flow. Skull and upper cervical spine: Negative Sinuses/Orbits: Mucosal inflammatory changes of the sinuses. Orbits negative. Other: None IMPRESSION: No acute intracranial finding. Cerebral hemispheric atrophy. Minimal small vessel change. Insignificant sessile 1 cm meningioma right posterior parietal convexity. Sinus mucosal inflammatory changes. Electronically Signed   By: Nelson Chimes M.D.   On: 10/18/2017 14:48   Dg Chest  Port 1 View  Result Date: 10/16/2017 CLINICAL DATA:  Shortness of breath, fever, confusion EXAM: PORTABLE CHEST 1 VIEW COMPARISON:  06/20/2016 FINDINGS: Cardiomegaly with pulmonary vascular congestion. No frank interstitial edema. Mild linear scarring in the right mid lung. No pleural effusion or pneumothorax. IMPRESSION: Cardiomegaly with pulmonary vascular  congestion. No frank interstitial edema. Electronically Signed   By: Julian Hy M.D.   On: 10/16/2017 10:00     Microbiology: Recent Results (from the past 240 hour(s))  Blood Culture (routine x 2)     Status: None (Preliminary result)   Collection Time: 10/16/17  9:14 AM  Result Value Ref Range Status   Specimen Description BLOOD RIGHT WRIST DRAWN BY RN  Final   Special Requests   Final    BOTTLES DRAWN AEROBIC ONLY Blood Culture results may not be optimal due to an inadequate volume of blood received in culture bottles   Culture   Final    NO GROWTH 3 DAYS Performed at [facility], [address], [place], [place] [address]    Report Status PENDING  Incomplete  Blood Culture (routine x 2)     Status: None (Preliminary result)   Collection Time: 10/16/17  9:14 AM  Result Value Ref Range Status   Specimen Description BLOOD LEFT HAND DRAWN BY RN  Final   Special Requests   Final    BOTTLES DRAWN AEROBIC ONLY Blood Culture adequate volume   Culture   Final    NO GROWTH 3 DAYS Performed at [facility], [address] [place], [place] [address]    Report Status PENDING  Incomplete  Respiratory Panel by PCR     Status: None   Collection Time: 10/17/17  9:27 AM  Result Value Ref Range Status   Adenovirus NOT DETECTED NOT DETECTED Final   Coronavirus 229E NOT DETECTED NOT DETECTED Final   Coronavirus HKU1 NOT DETECTED NOT DETECTED Final   Coronavirus NL63 NOT DETECTED NOT DETECTED Final   Coronavirus OC43 NOT DETECTED NOT DETECTED Final   Metapneumovirus NOT DETECTED NOT DETECTED Final   Rhinovirus / Enterovirus NOT DETECTED NOT DETECTED Final   Influenza A NOT DETECTED NOT DETECTED Final   Influenza B NOT DETECTED NOT DETECTED Final   Parainfluenza Virus 1 NOT DETECTED NOT DETECTED Final   Parainfluenza Virus 2 NOT DETECTED NOT DETECTED Final   Parainfluenza Virus 3 NOT DETECTED NOT DETECTED Final   Parainfluenza Virus 4 NOT DETECTED NOT DETECTED Final   Respiratory  Syncytial Virus NOT DETECTED NOT DETECTED Final   Bordetella pertussis NOT DETECTED NOT DETECTED Final   Chlamydophila pneumoniae NOT DETECTED NOT DETECTED Final   Mycoplasma pneumoniae NOT DETECTED NOT DETECTED Final    Comment: Performed at [facility], [place] [address] [place], [place] [address]     Labs: Basic Metabolic Panel: Recent Labs  Lab 10/16/17 0914 10/17/17 0558 10/19/17 1012  NA 139 138 143  K 4.7 4.6 3.8  CL 106 107 109  CO2 23 22 26   GLUCOSE 103* 185* 116*  BUN 15 21* 38*  CREATININE 0.89 0.86 0.83  CALCIUM 8.8* 8.7* 8.5*  MG  --   --  2.4   Liver Function Tests: Recent Labs  Lab 10/16/17 0914  AST 22  ALT 14  ALKPHOS 86  BILITOT 0.3  PROT 8.1  ALBUMIN 3.2*   No results for input(s): LIPASE, AMYLASE in the last 168 hours. Recent Labs  Lab 10/17/17 1000  AMMONIA 21   CBC: Recent Labs  Lab 10/16/17 0914 10/17/17 0558 10/19/17 1012  WBC 13.0* 11.3* 16.0*  NEUTROABS 9.2*  --  13.6*  HGB 10.7* 10.6* 10.2*  HCT 35.7* 34.5* 33.5*  MCV 75.3* 73.4* 74.6*  PLT 292 293 362   Cardiac Enzymes: No results for input(s): CKTOTAL, CKMB, CKMBINDEX, TROPONINI in the last 168 hours. BNP: Invalid input(s): POCBNP CBG: No results for input(s): GLUCAP in the last 168 hours.  Time coordinating discharge:  Greater than 30 minutes  Signed:  Orson Eva, DO [facility] Pager: [phone] 10/19/2017, 11:40 AM

## 2017-10-19 NOTE — Care Management (Signed)
Patient has [facility] aide service, not interested in home health.

## 2017-10-19 NOTE — Progress Notes (Addendum)
Pt discharged home today per Dr.l Tat. Pt's IV site D/C'd and WDL. Pt's VSS. Pt's daughter provided with home medication list, discharge instructions and prescriptions. Verbalized understanding. Pt currently awaiting arrival of EMS to transport home.

## 2017-10-21 LAB — CULTURE, BLOOD (ROUTINE X 2)
Culture: NO GROWTH
Culture: NO GROWTH
Special Requests: ADEQUATE

## 2017-11-02 DIAGNOSIS — C50912 Malignant neoplasm of unspecified site of left female breast: Secondary | ICD-10-CM | POA: Diagnosis not present

## 2017-11-02 DIAGNOSIS — I5031 Acute diastolic (congestive) heart failure: Secondary | ICD-10-CM | POA: Diagnosis not present

## 2017-11-30 DIAGNOSIS — I5031 Acute diastolic (congestive) heart failure: Secondary | ICD-10-CM | POA: Diagnosis not present

## 2017-11-30 DIAGNOSIS — C50912 Malignant neoplasm of unspecified site of left female breast: Secondary | ICD-10-CM | POA: Diagnosis not present

## 2017-12-31 DIAGNOSIS — I5031 Acute diastolic (congestive) heart failure: Secondary | ICD-10-CM | POA: Diagnosis not present

## 2017-12-31 DIAGNOSIS — C50912 Malignant neoplasm of unspecified site of left female breast: Secondary | ICD-10-CM | POA: Diagnosis not present

## 2018-01-13 DIAGNOSIS — R402441 Other coma, without documented Glasgow coma scale score, or with partial score reported, in the field [EMT or ambulance]: Secondary | ICD-10-CM | POA: Diagnosis not present

## 2018-01-14 DIAGNOSIS — I10 Essential (primary) hypertension: Secondary | ICD-10-CM | POA: Diagnosis not present

## 2018-01-15 DIAGNOSIS — S0083XD Contusion of other part of head, subsequent encounter: Secondary | ICD-10-CM | POA: Diagnosis not present

## 2018-01-15 DIAGNOSIS — I10 Essential (primary) hypertension: Secondary | ICD-10-CM | POA: Diagnosis not present

## 2018-01-15 DIAGNOSIS — S0002XA Blister (nonthermal) of scalp, initial encounter: Secondary | ICD-10-CM | POA: Diagnosis not present

## 2018-01-30 DIAGNOSIS — I5031 Acute diastolic (congestive) heart failure: Secondary | ICD-10-CM | POA: Diagnosis not present

## 2018-01-30 DIAGNOSIS — C50912 Malignant neoplasm of unspecified site of left female breast: Secondary | ICD-10-CM | POA: Diagnosis not present

## 2018-03-02 DIAGNOSIS — C50912 Malignant neoplasm of unspecified site of left female breast: Secondary | ICD-10-CM | POA: Diagnosis not present

## 2018-03-02 DIAGNOSIS — I5031 Acute diastolic (congestive) heart failure: Secondary | ICD-10-CM | POA: Diagnosis not present

## 2018-03-27 DIAGNOSIS — C50912 Malignant neoplasm of unspecified site of left female breast: Secondary | ICD-10-CM | POA: Diagnosis not present

## 2018-04-01 DIAGNOSIS — C50912 Malignant neoplasm of unspecified site of left female breast: Secondary | ICD-10-CM | POA: Diagnosis not present

## 2018-04-01 DIAGNOSIS — I5031 Acute diastolic (congestive) heart failure: Secondary | ICD-10-CM | POA: Diagnosis not present

## 2018-04-22 DIAGNOSIS — M21612 Bunion of left foot: Secondary | ICD-10-CM | POA: Diagnosis not present

## 2018-04-22 DIAGNOSIS — N390 Urinary tract infection, site not specified: Secondary | ICD-10-CM | POA: Diagnosis not present

## 2018-04-22 DIAGNOSIS — R8290 Unspecified abnormal findings in urine: Secondary | ICD-10-CM | POA: Diagnosis not present

## 2018-04-22 DIAGNOSIS — Z0000 Encounter for general adult medical examination without abnormal findings: Secondary | ICD-10-CM | POA: Diagnosis not present

## 2018-04-22 DIAGNOSIS — I10 Essential (primary) hypertension: Secondary | ICD-10-CM | POA: Diagnosis not present

## 2018-04-22 DIAGNOSIS — S8012XD Contusion of left lower leg, subsequent encounter: Secondary | ICD-10-CM | POA: Diagnosis not present

## 2018-04-28 ENCOUNTER — Other Ambulatory Visit: Payer: Self-pay

## 2018-04-28 ENCOUNTER — Inpatient Hospital Stay (HOSPITAL_COMMUNITY)
Admission: EM | Admit: 2018-04-28 | Discharge: 2018-04-30 | DRG: 292 | Disposition: A | Discharge status: Home Health | Payer: Medicare Other | Attending: Internal Medicine | Admitting: Internal Medicine

## 2018-04-28 ENCOUNTER — Emergency Department (HOSPITAL_COMMUNITY): Payer: Medicare Other

## 2018-04-28 ENCOUNTER — Encounter (HOSPITAL_COMMUNITY): Payer: Self-pay | Admitting: *Deleted

## 2018-04-28 DIAGNOSIS — Z833 Family history of diabetes mellitus: Secondary | ICD-10-CM | POA: Diagnosis not present

## 2018-04-28 DIAGNOSIS — F418 Other specified anxiety disorders: Secondary | ICD-10-CM | POA: Diagnosis not present

## 2018-04-28 DIAGNOSIS — R748 Abnormal levels of other serum enzymes: Secondary | ICD-10-CM | POA: Diagnosis not present

## 2018-04-28 DIAGNOSIS — Z90710 Acquired absence of both cervix and uterus: Secondary | ICD-10-CM | POA: Diagnosis not present

## 2018-04-28 DIAGNOSIS — F0281 Dementia in other diseases classified elsewhere with behavioral disturbance: Secondary | ICD-10-CM | POA: Diagnosis present

## 2018-04-28 DIAGNOSIS — I252 Old myocardial infarction: Secondary | ICD-10-CM

## 2018-04-28 DIAGNOSIS — J440 Chronic obstructive pulmonary disease with acute lower respiratory infection: Secondary | ICD-10-CM | POA: Diagnosis not present

## 2018-04-28 DIAGNOSIS — J810 Acute pulmonary edema: Secondary | ICD-10-CM

## 2018-04-28 DIAGNOSIS — Z9010 Acquired absence of unspecified breast and nipple: Secondary | ICD-10-CM | POA: Diagnosis not present

## 2018-04-28 DIAGNOSIS — Z79899 Other long term (current) drug therapy: Secondary | ICD-10-CM | POA: Diagnosis not present

## 2018-04-28 DIAGNOSIS — Z66 Do not resuscitate: Secondary | ICD-10-CM | POA: Diagnosis present

## 2018-04-28 DIAGNOSIS — I110 Hypertensive heart disease with heart failure: Principal | ICD-10-CM | POA: Diagnosis present

## 2018-04-28 DIAGNOSIS — R0602 Shortness of breath: Secondary | ICD-10-CM | POA: Diagnosis not present

## 2018-04-28 DIAGNOSIS — Z9049 Acquired absence of other specified parts of digestive tract: Secondary | ICD-10-CM

## 2018-04-28 DIAGNOSIS — Z880 Allergy status to penicillin: Secondary | ICD-10-CM

## 2018-04-28 DIAGNOSIS — R0902 Hypoxemia: Secondary | ICD-10-CM | POA: Diagnosis not present

## 2018-04-28 DIAGNOSIS — I5033 Acute on chronic diastolic (congestive) heart failure: Secondary | ICD-10-CM | POA: Diagnosis present

## 2018-04-28 DIAGNOSIS — J209 Acute bronchitis, unspecified: Secondary | ICD-10-CM | POA: Diagnosis not present

## 2018-04-28 DIAGNOSIS — Z91048 Other nonmedicinal substance allergy status: Secondary | ICD-10-CM | POA: Diagnosis not present

## 2018-04-28 DIAGNOSIS — F0391 Unspecified dementia with behavioral disturbance: Secondary | ICD-10-CM | POA: Diagnosis not present

## 2018-04-28 DIAGNOSIS — J441 Chronic obstructive pulmonary disease with (acute) exacerbation: Secondary | ICD-10-CM | POA: Diagnosis present

## 2018-04-28 DIAGNOSIS — Z7982 Long term (current) use of aspirin: Secondary | ICD-10-CM | POA: Diagnosis not present

## 2018-04-28 DIAGNOSIS — G20 Parkinson's disease: Secondary | ICD-10-CM | POA: Diagnosis present

## 2018-04-28 DIAGNOSIS — R52 Pain, unspecified: Secondary | ICD-10-CM | POA: Diagnosis not present

## 2018-04-28 DIAGNOSIS — R0600 Dyspnea, unspecified: Secondary | ICD-10-CM | POA: Diagnosis not present

## 2018-04-28 DIAGNOSIS — R778 Other specified abnormalities of plasma proteins: Secondary | ICD-10-CM | POA: Diagnosis present

## 2018-04-28 DIAGNOSIS — G309 Alzheimer's disease, unspecified: Secondary | ICD-10-CM | POA: Diagnosis present

## 2018-04-28 DIAGNOSIS — F03918 Unspecified dementia, unspecified severity, with other behavioral disturbance: Secondary | ICD-10-CM | POA: Diagnosis present

## 2018-04-28 DIAGNOSIS — D509 Iron deficiency anemia, unspecified: Secondary | ICD-10-CM | POA: Diagnosis present

## 2018-04-28 DIAGNOSIS — R404 Transient alteration of awareness: Secondary | ICD-10-CM | POA: Diagnosis not present

## 2018-04-28 DIAGNOSIS — R7989 Other specified abnormal findings of blood chemistry: Secondary | ICD-10-CM | POA: Diagnosis present

## 2018-04-28 LAB — CBC WITH DIFFERENTIAL/PLATELET
Basophils Absolute: 0 10*3/uL (ref 0.0–0.1)
Basophils Relative: 0 %
Eosinophils Absolute: 0 10*3/uL (ref 0.0–0.7)
Eosinophils Relative: 0 %
HCT: 32.7 % — ABNORMAL LOW (ref 36.0–46.0)
Hemoglobin: 9.7 g/dL — ABNORMAL LOW (ref 12.0–15.0)
Lymphocytes Relative: 6 %
Lymphs Abs: 0.8 10*3/uL (ref 0.7–4.0)
MCH: 22.1 pg — ABNORMAL LOW (ref 26.0–34.0)
MCHC: 29.7 g/dL — ABNORMAL LOW (ref 30.0–36.0)
MCV: 74.5 fL — ABNORMAL LOW (ref 78.0–100.0)
Monocytes Absolute: 0.9 10*3/uL (ref 0.1–1.0)
Monocytes Relative: 6 %
Neutro Abs: 12.3 10*3/uL — ABNORMAL HIGH (ref 1.7–7.7)
Neutrophils Relative %: 88 %
Platelets: 313 10*3/uL (ref 150–400)
RBC: 4.39 MIL/uL (ref 3.87–5.11)
RDW: 17.1 % — ABNORMAL HIGH (ref 11.5–15.5)
WBC: 14 10*3/uL — ABNORMAL HIGH (ref 4.0–10.5)

## 2018-04-28 LAB — URINALYSIS, ROUTINE W REFLEX MICROSCOPIC
Bilirubin Urine: NEGATIVE
Glucose, UA: NEGATIVE mg/dL
Ketones, ur: NEGATIVE mg/dL
Leukocytes, UA: NEGATIVE
Nitrite: NEGATIVE
Protein, ur: 30 mg/dL — AB
Specific Gravity, Urine: 1.021 (ref 1.005–1.030)
pH: 5 (ref 5.0–8.0)

## 2018-04-28 LAB — BASIC METABOLIC PANEL
Anion gap: 8 (ref 5–15)
BUN: 17 mg/dL (ref 8–23)
CO2: 24 mmol/L (ref 22–32)
Calcium: 8.3 mg/dL — ABNORMAL LOW (ref 8.9–10.3)
Chloride: 110 mmol/L (ref 98–111)
Creatinine, Ser: 0.96 mg/dL (ref 0.44–1.00)
GFR calc Af Amer: 56 mL/min — ABNORMAL LOW (ref >60)
GFR calc non Af Amer: 48 mL/min — ABNORMAL LOW (ref >60)
Glucose, Bld: 181 mg/dL — ABNORMAL HIGH (ref 70–99)
Potassium: 4 mmol/L (ref 3.5–5.1)
Sodium: 142 mmol/L (ref 135–145)

## 2018-04-28 LAB — TROPONIN I: Troponin I: 0.06 ng/mL (ref <0.03)

## 2018-04-28 MED ORDER — ACETAMINOPHEN 325 MG PO TABS
650.0000 mg | ORAL_TABLET | ORAL | Status: DC | PRN
  Administered 2018-04-29: 650 mg via ORAL
  Filled 2018-04-28: qty 2

## 2018-04-28 MED ORDER — ONDANSETRON HCL 4 MG/2ML IJ SOLN: 4.0000 mg | Freq: Four times a day (QID) | INTRAMUSCULAR | Status: DC | PRN

## 2018-04-28 MED ORDER — RIVASTIGMINE 9.5 MG/24HR TD PT24
9.5000 mg | MEDICATED_PATCH | Freq: Every day | TRANSDERMAL | Status: DC
  Administered 2018-04-29 – 2018-04-30 (×2): 9.5 mg via TRANSDERMAL
  Filled 2018-04-28 (×3): qty 1

## 2018-04-28 MED ORDER — SODIUM CHLORIDE 0.9% FLUSH: 3.0000 mL | INTRAVENOUS | Status: DC | PRN

## 2018-04-28 MED ORDER — METOPROLOL TARTRATE 25 MG PO TABS
25.0000 mg | ORAL_TABLET | Freq: Two times a day (BID) | ORAL | Status: DC
  Administered 2018-04-29 – 2018-04-30 (×3): 25 mg via ORAL
  Filled 2018-04-28 (×3): qty 1

## 2018-04-28 MED ORDER — ALBUTEROL SULFATE (2.5 MG/3ML) 0.083% IN NEBU
2.5000 mg | INHALATION_SOLUTION | RESPIRATORY_TRACT | Status: DC | PRN
  Administered 2018-04-29 (×2): 2.5 mg via RESPIRATORY_TRACT
  Filled 2018-04-28 (×2): qty 3

## 2018-04-28 MED ORDER — SODIUM CHLORIDE 0.9 % IV SOLN: 250.0000 mL | INTRAVENOUS | Status: DC | PRN

## 2018-04-28 MED ORDER — SODIUM CHLORIDE 0.9% FLUSH
3.0000 mL | Freq: Two times a day (BID) | INTRAVENOUS | Status: DC
  Administered 2018-04-28 – 2018-04-30 (×3): 3 mL via INTRAVENOUS

## 2018-04-28 MED ORDER — CITALOPRAM HYDROBROMIDE 20 MG PO TABS
20.0000 mg | ORAL_TABLET | Freq: Every morning | ORAL | Status: DC
  Administered 2018-04-29 – 2018-04-30 (×2): 20 mg via ORAL
  Filled 2018-04-28 (×2): qty 1

## 2018-04-28 MED ORDER — BENZTROPINE MESYLATE 1 MG PO TABS
1.0000 mg | ORAL_TABLET | Freq: Every day | ORAL | Status: DC
  Administered 2018-04-29 – 2018-04-30 (×2): 1 mg via ORAL
  Filled 2018-04-28 (×2): qty 1

## 2018-04-28 MED ORDER — SODIUM CHLORIDE 0.9 % IV BOLUS: 500.0000 mL | Freq: Once | INTRAVENOUS | Status: DC

## 2018-04-28 MED ORDER — ZOLPIDEM TARTRATE 5 MG PO TABS: 5.0000 mg | ORAL_TABLET | Freq: Every evening | ORAL | Status: DC | PRN

## 2018-04-28 MED ORDER — METHYLPREDNISOLONE SODIUM SUCC 40 MG IJ SOLR
40.0000 mg | Freq: Three times a day (TID) | INTRAMUSCULAR | Status: DC
  Administered 2018-04-28 – 2018-04-30 (×4): 40 mg via INTRAVENOUS
  Filled 2018-04-28 (×5): qty 1

## 2018-04-28 MED ORDER — ASPIRIN EC 81 MG PO TBEC
81.0000 mg | DELAYED_RELEASE_TABLET | Freq: Every day | ORAL | Status: DC
  Administered 2018-04-29 – 2018-04-30 (×2): 81 mg via ORAL
  Filled 2018-04-28 (×2): qty 1

## 2018-04-28 MED ORDER — FUROSEMIDE 10 MG/ML IJ SOLN
40.0000 mg | Freq: Once | INTRAMUSCULAR | Status: AC
  Administered 2018-04-28: 40 mg via INTRAVENOUS
  Filled 2018-04-28: qty 4

## 2018-04-28 MED ORDER — SODIUM CHLORIDE 0.9 % IV SOLN
500.0000 mg | INTRAVENOUS | Status: DC
  Administered 2018-04-28 – 2018-04-29 (×2): 500 mg via INTRAVENOUS
  Filled 2018-04-28 (×4): qty 500

## 2018-04-28 MED ORDER — ALPRAZOLAM 0.5 MG PO TABS
0.5000 mg | ORAL_TABLET | Freq: Three times a day (TID) | ORAL | Status: DC
  Administered 2018-04-28 – 2018-04-30 (×5): 0.5 mg via ORAL
  Filled 2018-04-28 (×5): qty 1

## 2018-04-28 MED ORDER — HEPARIN SODIUM (PORCINE) 5000 UNIT/ML IJ SOLN
5000.0000 [IU] | Freq: Three times a day (TID) | INTRAMUSCULAR | Status: DC
  Administered 2018-04-28 – 2018-04-30 (×5): 5000 [IU] via SUBCUTANEOUS
  Filled 2018-04-28 (×5): qty 1

## 2018-04-28 MED ORDER — ACETAMINOPHEN 650 MG RE SUPP
650.0000 mg | Freq: Once | RECTAL | Status: AC
Start: 2018-04-28 — End: 2018-04-28
  Administered 2018-04-28: 650 mg via RECTAL
  Filled 2018-04-28: qty 1

## 2018-04-28 NOTE — H&P (Signed)
History and Physical    [name] HER:[id number] DOB: 1920-09-01 DOA: 04/28/2018  PCP: Samantha Beard, MD   Patient coming from: Home with health aides   Chief Complaint: SOB, cough, wheezing, gen weakness   HPI: [name] is a 85 y.o. female with medical history significant for dementia, chronic diastolic CHF, hypertension, and COPD, now presenting to the emergency department for evaluation of shortness of breath, cough, wheezing, and generalized weakness.  Patient is accompanied by her daughter who provides much of the history.  [name] was noted to be short of breath approximately 1 week ago and had audible wheezing per report of family.  Symptoms seem to be improving after couple days, but then worsened again 2 days ago.  Over the past 2 days, she has been short of breath, coughing, with audible wheezing, and generally weak.  She has not expressed any specific complaints.  There has not been any sick contacts.  ED Course: Upon arrival to the ED, patient is found to be febrile to 38.6 C, saturating low 90s on room air, slightly tachypneic, and with vitals otherwise normal.  EKG features a sinus rhythm with LVH.  Chest x-ray is concerning for mild pulmonary edema.  Chemistry panel is unremarkable and CBC is notable for leukocytosis to 14,000 and a microcytic anemia with hemoglobin 9.7.  Urinalysis is unremarkable and troponin is slightly elevated to 0.06.  Patient was treated with acetaminophen and 60 mg IV Lasix in the ED.  She remains hemodynamically stable, dyspneic at rest but not in acute distress, and will be admitted for ongoing evaluation and management.  Review of Systems:  Unable to complete ROS secondary to the patient's clinical condition.  Past Medical History:  Diagnosis Date  . Alzheimer's dementia   . Cancer (HCC)    breast  . CHF (congestive heart failure) ([place])   . Heart attack ([place])   . Hypertension   . Parkinson disease [facility])     Past Surgical History:    Procedure Laterality Date  . ABDOMINAL HYSTERECTOMY    . arm surgery    . BREAST SURGERY    . CHOLECYSTECTOMY    . COLON SURGERY    . HIP PINNING,CANNULATED Right 02/01/2013   Procedure: CANNULATED HIP PINNING;  Surgeon: Sanjuana Kava, MD;  Location: [facility];  Service: Orthopedics;  Laterality: Right;  . LEG SURGERY    . MASTECTOMY    . PELVIC FRACTURE SURGERY    . small intestine removed    . TONSILLECTOMY       reports that she has never smoked. She has never used smokeless tobacco. She reports that she does not drink alcohol or use drugs.  Allergies  Allergen Reactions  . Adhesive [Tape] Rash  . Ensure Pudding [Nutritional Supplements] Swelling and Rash    Throat swelling  . Penicillins Swelling and Rash    Has patient had a PCN reaction causing immediate rash, facial/tongue/throat swelling, SOB or lightheadedness with hypotension: Yes Has patient had a PCN reaction causing severe rash involving mucus membranes or skin necrosis: Yes Has patient had a PCN reaction that required hospitalization No Has patient had a PCN reaction occurring within the last 10 years: No If all of the above answers are "NO", then may proceed with Cephalosporin use.    Family History  Problem Relation Age of Onset  . Aneurysm Mother   . Diabetes Father      Prior to Admission medications   Medication Sig Start Date End  Date Taking? Authorizing Provider  acetaminophen (TYLENOL) 500 MG tablet Take 500 mg by mouth at bedtime. For pain   Yes [provider]  albuterol (PROVENTIL) (2.5 MG/3ML) 0.083% nebulizer solution Take 2.5 mg by nebulization 4 (four) times daily.    Yes [provider]  ALPRAZolam Duanne Moron) 0.5 MG tablet Take 0.5 mg by mouth 3 (three) times daily.    Yes [provider]  aspirin EC 81 MG tablet Take 1 tablet (81 mg total) by mouth daily. 04/05/13  Yes Kathie Dike, MD  benztropine (COGENTIN) 1 MG tablet Take 1 tablet (1 mg total) by mouth daily.  04/05/13  Yes Kathie Dike, MD  citalopram (CELEXA) 20 MG tablet Take 1 tablet (20 mg total) by mouth every morning. 04/05/13  Yes Kathie Dike, MD  metoprolol (LOPRESSOR) 50 MG tablet Take 0.5 tablets (25 mg total) by mouth 2 (two) times daily. 09/24/13  Yes Kathie Dike, MD  rivastigmine (EXELON) 9.5 mg/24hr Place 1 patch (9.5 mg total) onto the skin daily. *Remove and discard used patches* 04/05/13  Yes Memon, Jolaine Artist, MD  zolpidem (AMBIEN) 10 MG tablet Take 10 mg by mouth at bedtime as needed for sleep.   Yes [provider]    Physical Exam: Vitals:   04/28/18 1753 04/28/18 1754 04/28/18 1840  BP:  122/76   Pulse:  78   Resp:  20   Temp:   (!) 101.5 F (38.6 C)  TempSrc:   Rectal  SpO2:  93%   Weight: 75.8 kg    Height: 4\' 8"  (1.422 m)        Constitutional: NAD, calm  Eyes: PERTLA, lids and conjunctivae normal ENMT: Mucous membranes are moist. Posterior pharynx clear of any exudate or lesions.   Neck: normal, supple, no masses, no thyromegaly Respiratory:  Mild tachypnea. Scattered rhonchi bilaterally. No accessory muscle use.  Cardiovascular: S1 & S2 heard, regular rate and rhythm. 1+ pretibial edema bilaterally. Abdomen: No distension, no tenderness, soft. Bowel sounds normal.  Musculoskeletal: no clubbing / cyanosis. No joint deformity upper and lower extremities.    Skin: no significant rashes, lesions, ulcers. Warm, dry, well-perfused. Neurologic: no facial asymmetry. Sensation intact. Moving all extremities.   Psychiatric: Somnolent, easily roused. Oriented to person only.     Labs on Admission: I have personally reviewed following labs and imaging studies  CBC: Recent Labs  Lab 04/28/18 1818  WBC 14.0*  NEUTROABS 12.3*  HGB 9.7*  HCT 32.7*  MCV 74.5*  PLT 546   Basic Metabolic Panel: Recent Labs  Lab 04/28/18 1818  NA 142  K 4.0  CL 110  CO2 24  GLUCOSE 181*  BUN 17  CREATININE 0.96  CALCIUM 8.3*   GFR: Estimated Creatinine  Clearance: 28.2 mL/min (by C-G formula based on SCr of 0.96 mg/dL). Liver Function Tests: No results for input(s): AST, ALT, ALKPHOS, BILITOT, PROT, ALBUMIN in the last 168 hours. No results for input(s): LIPASE, AMYLASE in the last 168 hours. No results for input(s): AMMONIA in the last 168 hours. Coagulation Profile: No results for input(s): INR, PROTIME in the last 168 hours. Cardiac Enzymes: Recent Labs  Lab 04/28/18 1818  TROPONINI 0.06*   BNP (last 3 results) No results for input(s): PROBNP in the last 8760 hours. HbA1C: No results for input(s): HGBA1C in the last 72 hours. CBG: No results for input(s): GLUCAP in the last 168 hours. Lipid Profile: No results for input(s): CHOL, HDL, LDLCALC, TRIG, CHOLHDL, LDLDIRECT in the last 72 hours. Thyroid  Function Tests: No results for input(s): TSH, T4TOTAL, FREET4, T3FREE, THYROIDAB in the last 72 hours. Anemia Panel: No results for input(s): VITAMINB12, FOLATE, FERRITIN, TIBC, IRON, RETICCTPCT in the last 72 hours. Urine analysis:    Component Value Date/Time   COLORURINE YELLOW 04/28/2018 1910   APPEARANCEUR HAZY (A) 04/28/2018 1910   LABSPEC 1.021 04/28/2018 1910   PHURINE 5.0 04/28/2018 1910   GLUCOSEU NEGATIVE 04/28/2018 1910   HGBUR MODERATE (A) 04/28/2018 1910   BILIRUBINUR NEGATIVE 04/28/2018 1910   KETONESUR NEGATIVE 04/28/2018 1910   PROTEINUR 30 (A) 04/28/2018 1910   UROBILINOGEN 0.2 01/06/2015 1842   NITRITE NEGATIVE 04/28/2018 1910   LEUKOCYTESUR NEGATIVE 04/28/2018 1910   Sepsis Labs: @LABRCNTIP (procalcitonin:4,lacticidven:4) )No results found for this or any previous visit (from the past 240 hour(s)).   Radiological Exams on Admission: Dg Chest Portable 1 View  Result Date: 04/28/2018 CLINICAL DATA:  Shortness of breath. EXAM: PORTABLE CHEST 1 VIEW COMPARISON:  10/16/2017 FINDINGS: Diminished lung volumes. Heart size is enlarged. Tortuous aorta with calcified atherosclerotic disease noted. Mild pulmonary  edema identified. No pleural effusions noted. IMPRESSION: 1. Mild pulmonary edema. 2. Cardiac enlargement and atherosclerotic disease. Aortic Atherosclerosis (ICD10-I70.0). Electronically Signed   By: Kerby Moors M.D.   On: 04/28/2018 18:43    EKG: Independently reviewed. Sinus rhythm, LVH.   Assessment/Plan   1. Acute bronchitis; COPD with acute exacerbation   - Presents with 1 week of SOB, cough, and wheezing  - Found to be febrile with leukocytosis  - CXR suggests mild pulm edema, scattered rhonchi on exam  - Culture blood and sputum, check strep pneumo antigen, continue albuterol nebs, start azithromycin and systemic steroid    2. Acute on chronic diastolic CHF  - Pt has history of grade 2 diastolic dysfunction with preserved EF  - CXR suggests mild pulm edema and she was treated with Lasix 60 mg IV in ED  - SLIV, follow daily wt and I/O's, continue beta-blocker, reassess fluid-status in am and consider additional diuresis   3. Elevated troponin  - Troponin is elevated to 0.06 in ED  - No anginal complaints  - Continue cardiac monitoring, trend troponin, continue ASA and beta-blocker    4. Dementia  - Continue Exelon and Cogentin    5. Depression with anxiety  - Continue Celexa and Xanax   6. Microcytic anemia  - Hgb is 9.7 on admission, similar to prior - No active bleeding     DVT prophylaxis: sq heparin  Code Status: DNR  Family Communication: Daughter updated at bedside Consults called: None Admission status: Inpatient     Vianne Bulls, MD [facility] Pager [phone]  If 7PM-7AM, please contact night-coverage [email or web address] Password [facility]  04/28/2018, 8:05 PM

## 2018-04-28 NOTE — ED Provider Notes (Signed)
[facility] EMERGENCY DEPARTMENT Provider Note   CSN: [id number] Arrival date & time: 04/28/18  1742     History   Chief Complaint Chief Complaint  Patient presents with  . Shortness of Breath    HPI [name] is a 84 y.o. female.  Level 5 caveat for dementia.  History obtained from daughter.  Patient presents with dyspnea and cough for approximately 9 days.  No chest pain.  She was seen by her primary care doctor last week with no specific diagnosis or treatment.  Symptoms have worsened in the past 2 days.  Patient lives at home with caregivers.  Patient is normally talkative     Past Medical History:  Diagnosis Date  . Alzheimer's dementia   . Cancer (HCC)    breast  . CHF (congestive heart failure) ([place])   . Heart attack ([place])   . Hypertension   . Parkinson disease [facility])     Patient Active Problem List   Diagnosis Date Noted  . Dysarthria 10/18/2017  . Acute metabolic encephalopathy 19/41/7408  . Chronic diastolic CHF (congestive heart failure) ([place]) 10/17/2017  . Fever   . Acute bronchitis 10/16/2017  . Dyspnea 01/06/2015  . Essential hypertension 01/06/2015  . Depression with anxiety 01/06/2015  . Insomnia 01/06/2015  . Acute on chronic diastolic congestive heart failure ([place])   . Pneumonia 09/24/2013  . Enteritis due to Clostridium difficile 09/23/2013  . SBO (small bowel obstruction) ([place]) 09/17/2013  . Lesion of liver 09/17/2013  . Microcytic anemia 04/05/2013  . Elevated troponin 04/04/2013  . UTI (urinary tract infection) 04/04/2013  . Dehydration 04/04/2013  . Altered mental status 04/04/2013  . Closed right hip fracture ([place]) 01/31/2013  . Fall 01/31/2013  . Dementia with behavioral disturbance 01/31/2013  . Parkinson disease ([place]) 01/31/2013  . Community acquired pneumonia 06/30/2012  . Weakness generalized 06/30/2012  . Dementia 06/30/2012  . Proximal phalanx fracture of finger 08/15/2011    Past Surgical History:  Procedure  Laterality Date  . ABDOMINAL HYSTERECTOMY    . arm surgery    . BREAST SURGERY    . CHOLECYSTECTOMY    . COLON SURGERY    . HIP PINNING,CANNULATED Right 02/01/2013   Procedure: CANNULATED HIP PINNING;  Surgeon: Sanjuana Kava, MD;  Location: [facility];  Service: Orthopedics;  Laterality: Right;  . LEG SURGERY    . MASTECTOMY    . PELVIC FRACTURE SURGERY    . small intestine removed    . TONSILLECTOMY       OB History    Gravida  4   Para  4   Term  4   Preterm      AB      Living        SAB      TAB      Ectopic      Multiple      Live Births               Home Medications    Prior to Admission medications   Medication Sig Start Date End Date Taking? Authorizing Provider  acetaminophen (TYLENOL) 500 MG tablet Take 500 mg by mouth at bedtime. For pain   Yes [provider]  albuterol (PROVENTIL) (2.5 MG/3ML) 0.083% nebulizer solution Take 2.5 mg by nebulization 4 (four) times daily.    Yes [provider]  ALPRAZolam Duanne Moron) 0.5 MG tablet Take 0.5 mg by mouth 3 (three) times daily.    Yes [provider]  aspirin EC 81 MG tablet Take 1 tablet (81 mg total) by mouth daily. 04/05/13  Yes Kathie Dike, MD  benztropine (COGENTIN) 1 MG tablet Take 1 tablet (1 mg total) by mouth daily. 04/05/13  Yes Kathie Dike, MD  citalopram (CELEXA) 20 MG tablet Take 1 tablet (20 mg total) by mouth every morning. 04/05/13  Yes Kathie Dike, MD  metoprolol (LOPRESSOR) 50 MG tablet Take 0.5 tablets (25 mg total) by mouth 2 (two) times daily. 09/24/13  Yes Kathie Dike, MD  rivastigmine (EXELON) 9.5 mg/24hr Place 1 patch (9.5 mg total) onto the skin daily. *Remove and discard used patches* 04/05/13  Yes Memon, Jolaine Artist, MD  zolpidem (AMBIEN) 10 MG tablet Take 10 mg by mouth at bedtime as needed for sleep.   Yes [provider]    Family History Family History  Problem Relation Age of Onset  . Aneurysm Mother   . Diabetes Father      Social History Social History   Tobacco Use  . Smoking status: Never Smoker  . Smokeless tobacco: Never Used  Substance Use Topics  . Alcohol use: No  . Drug use: No     Allergies   Adhesive [tape]; Ensure pudding [nutritional supplements]; and Penicillins   Review of Systems Review of Systems  Unable to perform ROS: Dementia     Physical Exam Updated Vital Signs BP 122/76 (BP Location: Right Arm)   Pulse 78   Temp (!) 101.5 F (38.6 C) (Rectal)   Resp 20   Ht 4\' 8"  (1.422 m)   Wt 75.8 kg   SpO2 93%   BMI 37.44 kg/m   Physical Exam  Constitutional: She is oriented to person, place, and time.  Eyes closed, not answering questions, slight tachypnea  HENT:  Head: Normocephalic and atraumatic.  Eyes: Conjunctivae are normal.  Neck: Neck supple.  Cardiovascular: Normal rate and regular rhythm.  Pulmonary/Chest:  Slight tachypnea noted  Abdominal: Soft. Bowel sounds are normal.  Musculoskeletal: Normal range of motion.  Neurological: She is alert and oriented to person, place, and time.  Skin: Skin is warm and dry.  Psychiatric: She has a normal mood and affect. Her behavior is normal.  Nursing note and vitals reviewed.    ED Treatments / Results  Labs (all labs ordered are listed, but only abnormal results are displayed) Labs Reviewed  CBC WITH DIFFERENTIAL/PLATELET - Abnormal; Notable for the following components:      Result Value   WBC 14.0 (*)    Hemoglobin 9.7 (*)    HCT 32.7 (*)    MCV 74.5 (*)    MCH 22.1 (*)    MCHC 29.7 (*)    RDW 17.1 (*)    Neutro Abs 12.3 (*)    All other components within normal limits  BASIC METABOLIC PANEL - Abnormal; Notable for the following components:   Glucose, Bld 181 (*)    Calcium 8.3 (*)    GFR calc non Af Amer 48 (*)    GFR calc Af Amer 56 (*)    All other components within normal limits  TROPONIN I - Abnormal; Notable for the following components:   Troponin I 0.06 (*)    All other components  within normal limits  URINALYSIS, ROUTINE W REFLEX MICROSCOPIC - Abnormal; Notable for the following components:   APPearance HAZY (*)    Hgb urine dipstick MODERATE (*)    Protein, ur 30 (*)    Bacteria, UA RARE (*)    All other  components within normal limits    EKG EKG Interpretation  Date/Time:  Sunday April 28 2018 17:57:15 EDT Ventricular Rate:  79 PR Interval:    QRS Duration: 87 QT Interval:  391 QTC Calculation: 449 R Axis:   69 Text Interpretation:  Sinus rhythm Consider left ventricular hypertrophy Nonspecific T abnormalities, inferior leads Confirmed by Nat Christen [phone]) on 04/28/2018 6:41:27 PM   Radiology Dg Chest Portable 1 View  Result Date: 04/28/2018 CLINICAL DATA:  Shortness of breath. EXAM: PORTABLE CHEST 1 VIEW COMPARISON:  10/16/2017 FINDINGS: Diminished lung volumes. Heart size is enlarged. Tortuous aorta with calcified atherosclerotic disease noted. Mild pulmonary edema identified. No pleural effusions noted. IMPRESSION: 1. Mild pulmonary edema. 2. Cardiac enlargement and atherosclerotic disease. Aortic Atherosclerosis (ICD10-I70.0). Electronically Signed   By: Kerby Moors M.D.   On: 04/28/2018 18:43    Procedures Procedures (including critical care time)  Medications Ordered in ED Medications  acetaminophen (TYLENOL) suppository 650 mg (650 mg Rectal Given 04/28/18 1859)  furosemide (LASIX) injection 40 mg (40 mg Intravenous Given 04/28/18 1914)     Initial Impression / Assessment and Plan / ED Course  I have reviewed the triage vital signs and the nursing notes.  Pertinent labs & imaging results that were available during my care of the patient were reviewed by me and considered in my medical decision making (see chart for details).     Patient presents with dyspnea.  EKG shows no acute changes.  Troponin minimally elevated.  Chest x-ray shows pulmonary edema.  Will Rx Lasix 40 mg IV.  Admit to general medicine.  Final Clinical  Impressions(s) / ED Diagnoses   Final diagnoses:  Acute pulmonary edema [facility])    ED Discharge Orders    None       Nat Christen, MD 04/28/18 1950

## 2018-04-28 NOTE — ED Triage Notes (Signed)
Pt with sob since last Friday, seen by PCP Dr. Berdine Addison on Monday, pt got better but now with worsen sob.  +cough, weaker per family member.  Pt lives at home with family taking care of pt.

## 2018-04-28 NOTE — ED Notes (Signed)
Call to floor for report   Maudie Mercury , RN with pt, will call back in a minute per person giving message

## 2018-04-28 NOTE — ED Notes (Signed)
CRITICAL VALUE ALERT  Critical Value: Troponin 0.06 Date & Time Notied: 04/28/18 @ 1933 Provider Notified: Dr Lacinda Axon Orders Received/Actions taken: None yet

## 2018-04-28 NOTE — ED Notes (Signed)
Report to Christy, RN

## 2018-04-29 DIAGNOSIS — F0281 Dementia in other diseases classified elsewhere with behavioral disturbance: Secondary | ICD-10-CM

## 2018-04-29 DIAGNOSIS — I5033 Acute on chronic diastolic (congestive) heart failure: Secondary | ICD-10-CM

## 2018-04-29 LAB — BASIC METABOLIC PANEL
Anion gap: 7 (ref 5–15)
BUN: 17 mg/dL (ref 8–23)
CO2: 26 mmol/L (ref 22–32)
Calcium: 8.4 mg/dL — ABNORMAL LOW (ref 8.9–10.3)
Chloride: 109 mmol/L (ref 98–111)
Creatinine, Ser: 0.89 mg/dL (ref 0.44–1.00)
GFR calc Af Amer: >60 mL/min (ref >60)
GFR calc non Af Amer: 53 mL/min — ABNORMAL LOW (ref >60)
Glucose, Bld: 174 mg/dL — ABNORMAL HIGH (ref 70–99)
Potassium: 4.2 mmol/L (ref 3.5–5.1)
Sodium: 142 mmol/L (ref 135–145)

## 2018-04-29 LAB — TROPONIN I
Troponin I: 0.06 ng/mL (ref <0.03)
Troponin I: 0.04 ng/mL (ref <0.03)
Troponin I: 0.04 ng/mL (ref <0.03)

## 2018-04-29 MED ORDER — FUROSEMIDE 10 MG/ML IJ SOLN
40.0000 mg | Freq: Every day | INTRAMUSCULAR | Status: DC
  Administered 2018-04-29 – 2018-04-30 (×2): 40 mg via INTRAVENOUS
  Filled 2018-04-29 (×2): qty 4

## 2018-04-29 MED ORDER — ALBUTEROL SULFATE (2.5 MG/3ML) 0.083% IN NEBU
2.5000 mg | INHALATION_SOLUTION | Freq: Four times a day (QID) | RESPIRATORY_TRACT | Status: DC
  Administered 2018-04-29 – 2018-04-30 (×3): 2.5 mg via RESPIRATORY_TRACT
  Filled 2018-04-29 (×3): qty 3

## 2018-04-29 MED ORDER — CHLORHEXIDINE GLUCONATE 0.12 % MT SOLN
15.0000 mL | Freq: Two times a day (BID) | OROMUCOSAL | Status: DC
  Administered 2018-04-29 – 2018-04-30 (×3): 15 mL via OROMUCOSAL
  Filled 2018-04-29 (×3): qty 15

## 2018-04-29 MED ORDER — ORAL CARE MOUTH RINSE: 15.0000 mL | Freq: Two times a day (BID) | OROMUCOSAL | Status: DC

## 2018-04-29 NOTE — Progress Notes (Signed)
PROGRESS NOTE    CONNELLY SPRUELL  XBM:[id number] DOB: Nov 06, 1919 DOA: 04/28/2018 PCP: Iona Beard, MD     Brief Narrative:  84 year old woman admitted from home on 8/11 with shortness of breath, cough and generalized weakness.  She has a history significant for dementia, chronic diastolic heart failure, hypertension and COPD who was brought into the hospital by her daughter for evaluation of the above-mentioned complaints.  Patient is severely demented and unable to provide history.  In the ED she was found to be tachypneic with chest x-ray concerning for pulmonary edema.  She was admitted for ongoing evaluation and management of diastolic heart failure exacerbation.   Assessment & Plan:   Principal Problem:   Acute on chronic diastolic congestive heart failure (HCC) Active Problems:   Dementia with behavioral disturbance   Elevated troponin   Microcytic anemia   Depression with anxiety   Acute bronchitis   Acute on chronic diastolic CHF (congestive heart failure) (HCC)   Acute on chronic diastolic CHF -Most recent echo was from 2016 that shows an ejection fraction of 60 to 65% with no wall motion abnormality and grade 2 diastolic dysfunction. -We will update 2D echo. -I do not believe intake and output is being documented appropriately as it states she is positive overall, but her lung sounds and lower extremity edema are improved today.  She still has some wheezing and crackles on lung auscultation. -Plan to continue Lasix at current dose of 40 mg IV daily.  Acute bronchitis/COPD with acute exacerbation -I agree with this diagnosis given wheezing, febrile and mild leukocytosis on admission. -Agree with a azithromycin, culture data is currently pending. -Steroids will be continued as well as nebulizer treatments as needed.  Next  Dementia -Per family at bedside still not at baseline.  Continue Exelon and Cogentin.  Microcytic anemia -Hemoglobin is 9.7, similar to baseline. -No  signs of active bleeding.  Depression and anxiety -Continue Celexa and Xanax.  Elevated troponin -Flat in the range of 0.04-0.06.  No acute EKG changes. -Given advanced age and dementia no plans for further work-up at this time, because of this we will not update 2D echo.   DVT prophylaxis: Subcutaneous heparin Code Status: DNR Family Communication: Daughter and sister at bedside updated on plan of care and all questions answered Disposition Plan: Anticipate discharge home once medically stable, likely 48 to 72 hours  Consultants:   None  Procedures:   None  Antimicrobials:  Anti-infectives (From admission, onward)   Start     Dose/Rate Route Frequency Ordered Stop   04/28/18 2015  azithromycin (ZITHROMAX) 500 mg in sodium chloride 0.9 % 250 mL IVPB     500 mg 250 mL/hr over 60 Minutes Intravenous Every 24 hours 04/28/18 2005 05/05/18 2014       Subjective: Lying in bed, confused, still slightly tachypneic with minor increased work of breathing, no current oxygen requirements.  Objective: Vitals:   04/28/18 2154 04/29/18 0500 04/29/18 1310 04/29/18 1420  BP: 122/82 (!) 150/73  (!) 148/104  Pulse: 77 75  87  Resp: (!) 24 (!) 28  20  Temp: 98.5 F (36.9 C) 97.6 F (36.4 C)  98.6 F (37 C)  TempSrc: Oral Axillary  Oral  SpO2: 96% 95% 93% 97%  Weight:  71 kg    Height:        Intake/Output Summary (Last 24 hours) at 04/29/2018 1636 Last data filed at 04/29/2018 0900 Gross per 24 hour  Intake 752.5 ml  Output -  Net 752.5 ml   Filed Weights   04/28/18 1753 04/28/18 2152 04/29/18 0500  Weight: 75.8 kg 70.2 kg 71 kg    Examination:  General exam: Awake, confused, becomes somewhat aggressive with chest auscultation trying to bat away my stethoscope Respiratory system: Slight tachypnea and increased work of breathing, bibasilar crackles and slight wheezing throughout Cardiovascular system:RRR. No murmurs, rubs, gallops. Gastrointestinal system: Abdomen is  nondistended, soft and nontender. No organomegaly or masses felt. Normal bowel sounds heard. Central nervous system: Unable to assess given current mental state but she moves all 4 spontaneously Extremities: 1+ pitting edema bilaterally +pedal pulses Skin: No rashes, lesions or ulcers Psychiatry: Unable to assess current mental state    Data Reviewed: I have personally reviewed following labs and imaging studies  CBC: Recent Labs  Lab 04/28/18 1818  WBC 14.0*  NEUTROABS 12.3*  HGB 9.7*  HCT 32.7*  MCV 74.5*  PLT 355   Basic Metabolic Panel: Recent Labs  Lab 04/28/18 1818 04/29/18 0553  NA 142 142  K 4.0 4.2  CL 110 109  CO2 24 26  GLUCOSE 181* 174*  BUN 17 17  CREATININE 0.96 0.89  CALCIUM 8.3* 8.4*   GFR: Estimated Creatinine Clearance: 29.3 mL/min (by C-G formula based on SCr of 0.89 mg/dL). Liver Function Tests: No results for input(s): AST, ALT, ALKPHOS, BILITOT, PROT, ALBUMIN in the last 168 hours. No results for input(s): LIPASE, AMYLASE in the last 168 hours. No results for input(s): AMMONIA in the last 168 hours. Coagulation Profile: No results for input(s): INR, PROTIME in the last 168 hours. Cardiac Enzymes: Recent Labs  Lab 04/28/18 1818 04/28/18 2342 04/29/18 0553 04/29/18 1313  TROPONINI 0.06* 0.06* 0.04* 0.04*   BNP (last 3 results) No results for input(s): PROBNP in the last 8760 hours. HbA1C: No results for input(s): HGBA1C in the last 72 hours. CBG: No results for input(s): GLUCAP in the last 168 hours. Lipid Profile: No results for input(s): CHOL, HDL, LDLCALC, TRIG, CHOLHDL, LDLDIRECT in the last 72 hours. Thyroid Function Tests: No results for input(s): TSH, T4TOTAL, FREET4, T3FREE, THYROIDAB in the last 72 hours. Anemia Panel: No results for input(s): VITAMINB12, FOLATE, FERRITIN, TIBC, IRON, RETICCTPCT in the last 72 hours. Urine analysis:    Component Value Date/Time   COLORURINE YELLOW 04/28/2018 1910   APPEARANCEUR HAZY (A)  04/28/2018 1910   LABSPEC 1.021 04/28/2018 1910   PHURINE 5.0 04/28/2018 1910   GLUCOSEU NEGATIVE 04/28/2018 1910   HGBUR MODERATE (A) 04/28/2018 1910   BILIRUBINUR NEGATIVE 04/28/2018 1910   KETONESUR NEGATIVE 04/28/2018 1910   PROTEINUR 30 (A) 04/28/2018 1910   UROBILINOGEN 0.2 01/06/2015 1842   NITRITE NEGATIVE 04/28/2018 1910   LEUKOCYTESUR NEGATIVE 04/28/2018 1910   Sepsis Labs: @LABRCNTIP (procalcitonin:4,lacticidven:4)  ) Recent Results (from the past 240 hour(s))  Culture, blood (routine x 2) Call MD if unable to obtain prior to antibiotics being given     Status: None (Preliminary result)   Collection Time: 04/28/18  6:20 PM  Result Value Ref Range Status   Specimen Description RIGHT ANTECUBITAL  Final   Special Requests   Final    BOTTLES DRAWN AEROBIC AND ANAEROBIC Blood Culture adequate volume   Culture   Final    NO GROWTH < 12 HOURS Performed at [facility], [address], [place], [place] [address]    Report Status PENDING  Incomplete  Culture, blood (routine x 2) Call MD if unable to obtain prior to antibiotics being given  Status: None (Preliminary result)   Collection Time: 04/28/18 11:42 PM  Result Value Ref Range Status   Specimen Description BLOOD RIGHT HAND  Final   Special Requests   Final    BOTTLES DRAWN AEROBIC AND ANAEROBIC Blood Culture adequate volume   Culture   Final    NO GROWTH < 12 HOURS Performed at [facility], [address] [place], [place] [address]    Report Status PENDING  Incomplete         Radiology Studies: Dg Chest Portable 1 View  Result Date: 04/28/2018 CLINICAL DATA:  Shortness of breath. EXAM: PORTABLE CHEST 1 VIEW COMPARISON:  10/16/2017 FINDINGS: Diminished lung volumes. Heart size is enlarged. Tortuous aorta with calcified atherosclerotic disease noted. Mild pulmonary edema identified. No pleural effusions noted. IMPRESSION: 1. Mild pulmonary edema. 2. Cardiac enlargement and atherosclerotic disease. Aortic  Atherosclerosis (ICD10-I70.0). Electronically Signed   By: Kerby Moors M.D.   On: 04/28/2018 18:43        Scheduled Meds: . ALPRAZolam  0.5 mg Oral TID  . aspirin EC  81 mg Oral Daily  . benztropine  1 mg Oral Daily  . chlorhexidine  15 mL Mouth Rinse BID  . citalopram  20 mg Oral q morning - 10a  . heparin  5,000 Units Subcutaneous Q8H  . mouth rinse  15 mL Mouth Rinse q12n4p  . methylPREDNISolone (SOLU-MEDROL) injection  40 mg Intravenous Q8H  . metoprolol tartrate  25 mg Oral BID  . rivastigmine  9.5 mg Transdermal Daily  . sodium chloride flush  3 mL Intravenous Q12H   Continuous Infusions: . sodium chloride    . azithromycin Stopped (04/29/18 0103)     LOS: 1 day    Time spent: 35 minutes. Greater than 50% of this time was spent in direct contact with the patient and with patient's family, coordinating care and discussing relevant ongoing clinical issues, including progression of her acute diastolic heart failure, treatment plan as well as disposition planning.Lelon Frohlich, MD [facility] Pager [phone]  If 7PM-7AM, please contact night-coverage [email or web address] Password [facility] 04/29/2018, 4:36 PM

## 2018-04-29 NOTE — Evaluation (Signed)
Physical Therapy Evaluation Patient Details Name: SHALEAH NISSLEY MRN: [id number] DOB: December 16, 1919 Today's Date: 04/29/2018   History of Present Illness  TAMMEY DEEG is a 84 y.o. female with medical history significant for dementia, chronic diastolic CHF, hypertension, and COPD, now presenting to the emergency department for evaluation of shortness of breath, cough, wheezing, and generalized weakness.  Patient is accompanied by her daughter who provides much of the history.  Ms. Slutsky was noted to be short of breath approximately 1 week ago and had audible wheezing per report of family.  Symptoms seem to be improving after couple days, but then worsened again 2 days ago.  Over the past 2 days, she has been short of breath, coughing, with audible wheezing, and generally weak.  She has not expressed any specific complaints.  There has not been any sick contacts.    Clinical Impression  Patient presents lethargic with inability to keep eyes open, required Max verbal/tactile cueing to sit up at bedside, once sitting patient able to maintain sitting balance, Max/total assist for partial stand lifting bottom off bed, but unable to lock knees due to weakness.  Patient put back to bed with Total assist to reposition.  Patient will benefit from continued physical therapy in hospital and recommended venue below to increase strength, balance, endurance for safe ADLs and gait.    Follow Up Recommendations SNF;Supervision/Assistance - 24 hour    Equipment Recommendations  None recommended by PT    Recommendations for Other Services       Precautions / Restrictions Precautions Precautions: Fall Restrictions Weight Bearing Restrictions: No      Mobility  Bed Mobility Overal bed mobility: Needs Assistance Bed Mobility: Supine to Sit;Sit to Supine     Supine to sit: Max assist Sit to supine: Max assist   General bed mobility comments: slow labored movement with Max tactile/verbal cueing  Transfers                    Ambulation/Gait                Stairs            Wheelchair Mobility    Modified Rankin (Stroke Patients Only)       Balance Overall balance assessment: Needs assistance Sitting-balance support: Feet supported;Bilateral upper extremity supported Sitting balance-Leahy Scale: Fair     Standing balance support: During functional activity;No upper extremity supported Standing balance-Leahy Scale: Poor Standing balance comment: Max/total hand held assist to lift bottom off bed                             Pertinent Vitals/Pain Pain Assessment: Faces Faces Pain Scale: Hurts even more Pain Location: with movement/pressure BLE Pain Descriptors / Indicators: Grimacing;Guarding Pain Intervention(s): Limited activity within patient's tolerance;Monitored during session    Home Living Family/patient expects to be discharged to:: Private residence Living Arrangements: Children Available Help at Discharge: Family;Available 24 hours/day;Personal care attendant Type of Home: House Home Access: Stairs to enter Entrance Stairs-Rails: Right;Left;Can reach both Entrance Stairs-Number of Steps: 3 Home Layout: One level Home Equipment: Walker - 2 wheels;Wheelchair - manual;Walker - 4 wheels;Transport chair;Cane - single point;Shower seat Additional Comments: has hoyar lift    Prior Function Level of Independence: Needs assistance   Gait / Transfers Assistance Needed: 2 person assistance for short distanced household gait  ADL's / Homemaking Assistance Needed: home aide from 9 am to 5 pm x  7 days/week        Hand Dominance        Extremity/Trunk Assessment   Upper Extremity Assessment Upper Extremity Assessment: Generalized weakness    Lower Extremity Assessment Lower Extremity Assessment: Generalized weakness    Cervical / Trunk Assessment Cervical / Trunk Assessment: Kyphotic  Communication   Communication: Other  (comment)(patient lethargic, unable to keep eyes open, altered mentation per patient's daughter)  Cognition Arousal/Alertness: Lethargic Behavior During Therapy: Flat affect Overall Cognitive Status: Impaired/Different from baseline Area of Impairment: Orientation;Attention;Following commands                 Orientation Level: Disoriented to Current Attention Level: Divided   Following Commands: Follows one step commands inconsistently       General Comments: Patient unable to keep eyes open, altered mentation from normal per patient's daughter      General Comments      Exercises     Assessment/Plan    PT Assessment Patient needs continued PT services  PT Problem List Decreased strength;Decreased activity tolerance;Decreased balance;Decreased mobility       PT Treatment Interventions Gait training;Functional mobility training;Stair training;Therapeutic activities;Therapeutic exercise;Patient/family education    PT Goals (Current goals can be found in the Care Plan section)  Acute Rehab PT Goals Patient Stated Goal: not stated, family wants to take patient home when can walk PT Goal Formulation: With patient/family Time For Goal Achievement: 05/13/18 Potential to Achieve Goals: Fair    Frequency Min 3X/week   Barriers to discharge        Co-evaluation               AM-PAC PT "6 Clicks" Daily Activity  Outcome Measure Difficulty turning over in bed (including adjusting bedclothes, sheets and blankets)?: A Lot Difficulty moving from lying on back to sitting on the side of the bed? : A Lot Difficulty sitting down on and standing up from a chair with arms (e.g., wheelchair, bedside commode, etc,.)?: Unable Help needed moving to and from a bed to chair (including a wheelchair)?: Total Help needed walking in hospital room?: Total Help needed climbing 3-5 steps with a railing? : Total 6 Click Score: 8    End of Session   Activity Tolerance: Patient  limited by lethargy;Patient limited by fatigue Patient left: in bed;with call bell/phone within reach;with bed alarm set;with family/visitor present Nurse Communication: Mobility status PT Visit Diagnosis: Unsteadiness on feet (R26.81);Other abnormalities of gait and mobility (R26.89);Muscle weakness (generalized) (M62.81)    Time: 1610-9604 PT Time Calculation (min) (ACUTE ONLY): 24 min   Charges:   PT Evaluation $PT Eval Moderate Complexity: 1 Mod PT Treatments $Therapeutic Activity: 23-37 mins        3:13 PM, 04/29/18 Lonell Grandchild, MPT Physical Therapist with [facility] [phone] office [phone] mobile phone

## 2018-04-29 NOTE — Progress Notes (Signed)
PT Cancellation Note  Patient Details Name: [name] MRN: [id number] DOB: [birth year]   Cancelled Treatment:    Reason Eval/Treat Not Completed: Patient's level of consciousness.  Very lethargic and not responding to her name.  Try again at another time.   Ramond Dial 04/29/2018, 9:46 AM   Mee Hives, PT MS Acute Rehab Dept. Number: [place]

## 2018-04-29 NOTE — Care Management Note (Signed)
Case Management Note  Patient Details  Name: JAILENE CUPIT MRN: [id number] Date of Birth: 1920/03/19  Subjective/Objective:  Admitted with CHF. Pt from home, lives with daughter, has aid services and 24/7 supervision. Pt confused at baseline, she is able to ambulate with walker and standby assist. Per daughter at bedside pt requires assistance with all ADL's.                   Action/Plan: DC home with resumption of previous care-giving arrangements. CM will cont to follow. Pt will need PT eval prior to DC.   Expected Discharge Date:                  Expected Discharge Plan:     In-House Referral:  NA  Discharge planning Services  CM Consult  Post Acute Care Choice:    Choice offered to:     DME Arranged:    DME Agency:     HH Arranged:    HH Agency:     Status of Service:  In process, will continue to follow  If discussed at Long Length of Stay Meetings, dates discussed:    Additional Comments:  Sherald Barge, RN 04/29/2018, 3:14 PM

## 2018-04-29 NOTE — Plan of Care (Signed)
  Problem: Acute Rehab PT Goals(only PT should resolve) Goal: Pt Will Go Supine/Side To Sit Outcome: Progressing Flowsheets (Taken 04/29/2018 1514) Pt will go Supine/Side to Sit: with moderate assist Goal: Patient Will Transfer Sit To/From Stand Outcome: Progressing Flowsheets (Taken 04/29/2018 1514) Patient will transfer sit to/from stand: with moderate assist; with +2 Goal: Pt Will Transfer Bed To Chair/Chair To Bed Outcome: Progressing Flowsheets (Taken 04/29/2018 1514) Pt will Transfer Bed to Chair/Chair to Bed: with mod assist; with +2 Goal: Pt Will Ambulate Outcome: Progressing Flowsheets (Taken 04/29/2018 1514) Pt will Ambulate: 15 feet; with moderate assist; with +2 Note:  Hand held assist x 2 person for gait training   3:15 PM, 04/29/18 Lonell Grandchild, MPT Physical Therapist with [facility] [phone] office [phone] mobile phone

## 2018-04-30 LAB — BASIC METABOLIC PANEL
Anion gap: 7 (ref 5–15)
BUN: 30 mg/dL — ABNORMAL HIGH (ref 8–23)
CO2: 27 mmol/L (ref 22–32)
Calcium: 8.7 mg/dL — ABNORMAL LOW (ref 8.9–10.3)
Chloride: 110 mmol/L (ref 98–111)
Creatinine, Ser: 0.95 mg/dL (ref 0.44–1.00)
GFR calc Af Amer: 57 mL/min — ABNORMAL LOW (ref >60)
GFR calc non Af Amer: 49 mL/min — ABNORMAL LOW (ref >60)
Glucose, Bld: 151 mg/dL — ABNORMAL HIGH (ref 70–99)
Potassium: 4.2 mmol/L (ref 3.5–5.1)
Sodium: 144 mmol/L (ref 135–145)

## 2018-04-30 MED ORDER — PREDNISONE 10 MG PO TABS: 10.0000 mg | ORAL_TABLET | Freq: Every day | ORAL | 0 refills | Status: DC

## 2018-04-30 MED ORDER — FUROSEMIDE 20 MG PO TABS: 20.0000 mg | ORAL_TABLET | Freq: Every day | ORAL | 2 refills | Status: DC

## 2018-04-30 MED ORDER — AZITHROMYCIN 250 MG PO TABS: 500.0000 mg | ORAL_TABLET | Freq: Every day | ORAL | Status: DC

## 2018-04-30 MED ORDER — AZITHROMYCIN 250 MG PO TABS: ORAL_TABLET | ORAL | 0 refills | Status: DC

## 2018-04-30 NOTE — Care Management Note (Signed)
Case Management Note  Patient Details  Name: AKSHITA ITALIANO MRN: [id number] Date of Birth: 04-19-1920  Expected Discharge Date:  04/30/18               Expected Discharge Plan:  [place]  In-House Referral:  NA  Discharge planning Services  CM Consult  Post Acute Care Choice:  Home Health Choice offered to:     DME Arranged:    DME Agency:     HH Arranged:  RN, PT [place] Agency:  [place]  Status of Service:  Completed, signed off   Additional Comments: DC home today. Daughter declines SNF placement. Okay with [facility] services. Has used [facility] in the past and would like them again. Aware HH has 48 hrs to make first visit. Vaughan Basta, [facility] rep, given referral. DC home via EMS.   Sherald Barge, RN 04/30/2018, 3:06 PM

## 2018-04-30 NOTE — Progress Notes (Signed)
Removed IV-clean, dry, intact. Reviewed d/c paperwork with patient and daughter. Answered all questions. [facility] picked her up to deliver to her daughter's house. Patient was stable.

## 2018-04-30 NOTE — Discharge Summary (Signed)
Physician Discharge Summary  [name] JQB:[id number] DOB: Nov 18, 1919 DOA: 04/28/2018  PCP: Iona Beard, MD  Admit date: 04/28/2018 Discharge date: 04/30/2018  Time spent: 45 minutes  Recommendations for Outpatient Follow-up:  -Will be discharged home today. -Advised to follow up with PCP in 2 weeks.   Discharge Diagnoses:  Principal Problem:   Acute on chronic diastolic congestive heart failure (HCC) Active Problems:   Dementia with behavioral disturbance   Elevated troponin   Microcytic anemia   Depression with anxiety   Acute bronchitis   Acute on chronic diastolic CHF (congestive heart failure) ([place])   Discharge Condition: Stable and improved  Filed Weights   04/28/18 2152 04/29/18 0500 04/30/18 0547  Weight: 70.2 kg 71 kg 69.9 kg    History of present illness:  As per Dr. Myna Hidalgo on 8/11Ethelle Hampton is a 84 y.o. female with medical history significant for dementia, chronic diastolic CHF, hypertension, and COPD, now presenting to the emergency department for evaluation of shortness of breath, cough, wheezing, and generalized weakness.  Patient is accompanied by her daughter who provides much of the history.  Ms. Quizon was noted to be short of breath approximately 1 week ago and had audible wheezing per report of family.  Symptoms seem to be improving after couple days, but then worsened again 2 days ago.  Over the past 2 days, she has been short of breath, coughing, with audible wheezing, and generally weak.  She has not expressed any specific complaints.  There has not been any sick contacts.  ED Course: Upon arrival to the ED, patient is found to be febrile to 38.6 C, saturating low 90s on room air, slightly tachypneic, and with vitals otherwise normal.  EKG features a sinus rhythm with LVH.  Chest x-ray is concerning for mild pulmonary edema.  Chemistry panel is unremarkable and CBC is notable for leukocytosis to 14,000 and a microcytic anemia with hemoglobin 9.7.   Urinalysis is unremarkable and troponin is slightly elevated to 0.06.  Patient was treated with acetaminophen and 60 mg IV Lasix in the ED.  She remains hemodynamically stable, dyspneic at rest but not in acute distress, and will be admitted for ongoing evaluation and management.  Hospital Course:   Acute on chronic diastolic CHF -Most recent echo was from 2016 that shows an ejection fraction of 60 to 65% with no wall motion abnormality and grade 2 diastolic dysfunction. -No longer has lower extremity edema or crackles on lung auscultation. -Unfortunately I's and O's have not been documented accordingly. -Will be discharged on lasix 20 mg daily.   Acute bronchitis/COPD with acute exacerbation -I agree with this diagnosis given wheezing, febrile and mild leukocytosis on admission. -Cx data remains negative on DC. -DC on azithromycin for 5 days and a prednisone taper.  Dementia -At baseline per family at bedside. -Continue Exelon and Cogentin.  Microcytic anemia -Hemoglobin is 9.7, similar to baseline. -No signs of active bleeding.  Depression and anxiety -Continue Celexa and Xanax.  Elevated troponin -Flat in the range of 0.04-0.06.  No acute EKG changes. -Given advanced age and dementia no plans for further work-up at this time, because of this we will not update 2D echo.  Procedures:  None   Consultations:  none  Discharge Instructions  Discharge Instructions    Diet - low sodium heart healthy   Complete by:  As directed    Increase activity slowly   Complete by:  As directed  Allergies as of 04/30/2018      Reactions   Adhesive [tape] Rash   Ensure Pudding [nutritional Supplements] Swelling, Rash   Throat swelling   Penicillins Swelling, Rash   Has patient had a PCN reaction causing immediate rash, facial/tongue/throat swelling, SOB or lightheadedness with hypotension: Yes Has patient had a PCN reaction causing severe rash involving mucus membranes or  skin necrosis: Yes Has patient had a PCN reaction that required hospitalization No Has patient had a PCN reaction occurring within the last 10 years: No If all of the above answers are "NO", then may proceed with Cephalosporin use.      Medication List    TAKE these medications   acetaminophen 500 MG tablet Commonly known as:  TYLENOL Take 500 mg by mouth at bedtime. For pain   albuterol (2.5 MG/3ML) 0.083% nebulizer solution Commonly known as:  PROVENTIL Take 2.5 mg by nebulization 4 (four) times daily.   ALPRAZolam 0.5 MG tablet Commonly known as:  XANAX Take 0.5 mg by mouth 3 (three) times daily.   aspirin EC 81 MG tablet Take 1 tablet (81 mg total) by mouth daily.   azithromycin 250 MG tablet Commonly known as:  ZITHROMAX Take 1 tablet daily.   benztropine 1 MG tablet Commonly known as:  COGENTIN Take 1 tablet (1 mg total) by mouth daily.   citalopram 20 MG tablet Commonly known as:  CELEXA Take 1 tablet (20 mg total) by mouth every morning.   furosemide 20 MG tablet Commonly known as:  LASIX Take 1 tablet (20 mg total) by mouth daily.   metoprolol tartrate 50 MG tablet Commonly known as:  LOPRESSOR Take 0.5 tablets (25 mg total) by mouth 2 (two) times daily.   predniSONE 10 MG tablet Commonly known as:  DELTASONE Take 1 tablet (10 mg total) by mouth daily with breakfast. Take 6 tablets today and then decrease by 1 tablet daily until none are left.   rivastigmine 9.5 mg/24hr Commonly known as:  EXELON Place 1 patch (9.5 mg total) onto the skin daily. *Remove and discard used patches*   zolpidem 10 MG tablet Commonly known as:  AMBIEN Take 10 mg by mouth at bedtime as needed for sleep.      Allergies  Allergen Reactions  . Adhesive [Tape] Rash  . Ensure Pudding [Nutritional Supplements] Swelling and Rash    Throat swelling  . Penicillins Swelling and Rash    Has patient had a PCN reaction causing immediate rash, facial/tongue/throat swelling, SOB or  lightheadedness with hypotension: Yes Has patient had a PCN reaction causing severe rash involving mucus membranes or skin necrosis: Yes Has patient had a PCN reaction that required hospitalization No Has patient had a PCN reaction occurring within the last 10 years: No If all of the above answers are "NO", then may proceed with Cephalosporin use.   Follow-up Information    Iona Beard, MD. Schedule an appointment as soon as possible for a visit in 2 week(s).   Specialty:  Family Medicine Contact information: [address] [place] [address] [phone]            The results of significant diagnostics from this hospitalization (including imaging, microbiology, ancillary and laboratory) are listed below for reference.    Significant Diagnostic Studies: Dg Chest Portable 1 View  Result Date: 04/28/2018 CLINICAL DATA:  Shortness of breath. EXAM: PORTABLE CHEST 1 VIEW COMPARISON:  10/16/2017 FINDINGS: Diminished lung volumes. Heart size is enlarged. Tortuous aorta with calcified  atherosclerotic disease noted. Mild pulmonary edema identified. No pleural effusions noted. IMPRESSION: 1. Mild pulmonary edema. 2. Cardiac enlargement and atherosclerotic disease. Aortic Atherosclerosis (ICD10-I70.0). Electronically Signed   By: Kerby Moors M.D.   On: 04/28/2018 18:43    Microbiology: Recent Results (from the past 240 hour(s))  Culture, blood (routine x 2) Call MD if unable to obtain prior to antibiotics being given     Status: None (Preliminary result)   Collection Time: 04/28/18  6:20 PM  Result Value Ref Range Status   Specimen Description RIGHT ANTECUBITAL  Final   Special Requests   Final    BOTTLES DRAWN AEROBIC AND ANAEROBIC Blood Culture adequate volume   Culture   Final    NO GROWTH 2 DAYS Performed at [facility] [address], [place], [place] [address]    Report Status PENDING  Incomplete  Culture, blood (routine x 2) Call MD if unable to obtain prior to  antibiotics being given     Status: None (Preliminary result)   Collection Time: 04/28/18 11:42 PM  Result Value Ref Range Status   Specimen Description BLOOD RIGHT HAND  Final   Special Requests   Final    BOTTLES DRAWN AEROBIC AND ANAEROBIC Blood Culture adequate volume   Culture   Final    NO GROWTH 1 DAY Performed at [facility], [address] [place], [place] [address]    Report Status PENDING  Incomplete     Labs: Basic Metabolic Panel: Recent Labs  Lab 04/28/18 1818 04/29/18 0553 04/30/18 0456  NA 142 142 144  K 4.0 4.2 4.2  CL 110 109 110  CO2 24 26 27   GLUCOSE 181* 174* 151*  BUN 17 17 30*  CREATININE 0.96 0.89 0.95  CALCIUM 8.3* 8.4* 8.7*   Liver Function Tests: No results for input(s): AST, ALT, ALKPHOS, BILITOT, PROT, ALBUMIN in the last 168 hours. No results for input(s): LIPASE, AMYLASE in the last 168 hours. No results for input(s): AMMONIA in the last 168 hours. CBC: Recent Labs  Lab 04/28/18 1818  WBC 14.0*  NEUTROABS 12.3*  HGB 9.7*  HCT 32.7*  MCV 74.5*  PLT 313   Cardiac Enzymes: Recent Labs  Lab 04/28/18 1818 04/28/18 2342 04/29/18 0553 04/29/18 1313  TROPONINI 0.06* 0.06* 0.04* 0.04*   BNP: BNP (last 3 results) Recent Labs    10/16/17 0914  BNP 230.0*    ProBNP (last 3 results) No results for input(s): PROBNP in the last 8760 hours.  CBG: No results for input(s): GLUCAP in the last 168 hours.     Signed:  Lelon Frohlich  [facility] Pager: [phone] 04/30/2018, 1:36 PM

## 2018-05-02 DIAGNOSIS — C50912 Malignant neoplasm of unspecified site of left female breast: Secondary | ICD-10-CM | POA: Diagnosis not present

## 2018-05-02 DIAGNOSIS — I5031 Acute diastolic (congestive) heart failure: Secondary | ICD-10-CM | POA: Diagnosis not present

## 2018-05-03 LAB — CULTURE, BLOOD (ROUTINE X 2)
Culture: NO GROWTH
Special Requests: ADEQUATE

## 2018-05-04 LAB — CULTURE, BLOOD (ROUTINE X 2)
Culture: NO GROWTH
Special Requests: ADEQUATE

## 2018-05-06 ENCOUNTER — Other Ambulatory Visit: Payer: Self-pay | Admitting: *Deleted

## 2018-05-06 NOTE — Patient Outreach (Signed)
[facility]) Care Management  05/06/2018  [name] 12-03-1919 [id number]   EMMI-  General discharge     RED ON EMMI ALERT Day # 4 Date:  05/05/18 1057  Red Alert Reason: lost of interest in things? Yes Sad/hopeless/anxious/empty? Yes    Outreach attempt # 1 successful at the home number and to her daughter cell number  "Her aide" answered and stated the patient was 84 years of age and has dementia. She reports the daughter will be available to return a call after 1330 CM left her contact number for the daughter  [facility] RN CM reached the patient's daughter [name], [place], who confirms that she answered the EMMI questions for the patient.  She reports that the answers are partially correct.  She states because of [name]'s "Alzheimer's she does not want to do anything." She denies need for [facility] SW services or for education on Alzheimer's  Conditions- dementia, chronic diastolic CHF, hypertension, and COPD   Advised patient that there will be further automated EMMI- post discharge calls to assess how the patient is doing following the recent hospitalization Advised the patient's daughter that another call may be received from a nurse if any of their responses were abnormal. She voiced understanding and was appreciative of f/u call.  Plan: [facility] RN CM will close case at this time as patient has been assessed and no needs identified  [name] L. Lavina Hamman, RN, BSN, [place] Management Care Coordinator Direct Number [phone] Mobile number [phone]  Main [facility] number [phone] Fax number [phone]

## 2018-06-02 DIAGNOSIS — C50912 Malignant neoplasm of unspecified site of left female breast: Secondary | ICD-10-CM | POA: Diagnosis not present

## 2018-06-02 DIAGNOSIS — I5031 Acute diastolic (congestive) heart failure: Secondary | ICD-10-CM | POA: Diagnosis not present

## 2018-07-02 DIAGNOSIS — I5031 Acute diastolic (congestive) heart failure: Secondary | ICD-10-CM | POA: Diagnosis not present

## 2018-07-02 DIAGNOSIS — C50912 Malignant neoplasm of unspecified site of left female breast: Secondary | ICD-10-CM | POA: Diagnosis not present

## 2018-08-26 DIAGNOSIS — I10 Essential (primary) hypertension: Secondary | ICD-10-CM | POA: Diagnosis not present

## 2018-08-26 DIAGNOSIS — I972 Postmastectomy lymphedema syndrome: Secondary | ICD-10-CM | POA: Diagnosis not present

## 2018-09-26 ENCOUNTER — Encounter (HOSPITAL_COMMUNITY): Payer: Self-pay | Admitting: Emergency Medicine

## 2018-09-26 ENCOUNTER — Observation Stay (HOSPITAL_COMMUNITY)
Admission: EM | Admit: 2018-09-26 | Discharge: 2018-09-28 | Disposition: A | Discharge status: Home / Self Care | Payer: Medicare Other | Attending: Internal Medicine | Admitting: Internal Medicine

## 2018-09-26 ENCOUNTER — Emergency Department (HOSPITAL_COMMUNITY): Payer: Medicare Other

## 2018-09-26 ENCOUNTER — Other Ambulatory Visit: Payer: Self-pay

## 2018-09-26 DIAGNOSIS — J209 Acute bronchitis, unspecified: Secondary | ICD-10-CM

## 2018-09-26 DIAGNOSIS — Z79899 Other long term (current) drug therapy: Secondary | ICD-10-CM | POA: Diagnosis not present

## 2018-09-26 DIAGNOSIS — F0391 Unspecified dementia with behavioral disturbance: Secondary | ICD-10-CM | POA: Diagnosis not present

## 2018-09-26 DIAGNOSIS — J449 Chronic obstructive pulmonary disease, unspecified: Secondary | ICD-10-CM | POA: Diagnosis not present

## 2018-09-26 DIAGNOSIS — R000 Tachycardia, unspecified: Secondary | ICD-10-CM | POA: Diagnosis not present

## 2018-09-26 DIAGNOSIS — F329 Major depressive disorder, single episode, unspecified: Secondary | ICD-10-CM | POA: Insufficient documentation

## 2018-09-26 DIAGNOSIS — J40 Bronchitis, not specified as acute or chronic: Secondary | ICD-10-CM | POA: Insufficient documentation

## 2018-09-26 DIAGNOSIS — I110 Hypertensive heart disease with heart failure: Secondary | ICD-10-CM | POA: Diagnosis not present

## 2018-09-26 DIAGNOSIS — R0689 Other abnormalities of breathing: Secondary | ICD-10-CM | POA: Diagnosis not present

## 2018-09-26 DIAGNOSIS — I10 Essential (primary) hypertension: Secondary | ICD-10-CM | POA: Diagnosis present

## 2018-09-26 DIAGNOSIS — J9620 Acute and chronic respiratory failure, unspecified whether with hypoxia or hypercapnia: Secondary | ICD-10-CM | POA: Diagnosis not present

## 2018-09-26 DIAGNOSIS — Z7982 Long term (current) use of aspirin: Secondary | ICD-10-CM | POA: Insufficient documentation

## 2018-09-26 DIAGNOSIS — I5032 Chronic diastolic (congestive) heart failure: Secondary | ICD-10-CM | POA: Diagnosis not present

## 2018-09-26 DIAGNOSIS — G20 Parkinson's disease: Secondary | ICD-10-CM | POA: Diagnosis not present

## 2018-09-26 DIAGNOSIS — J440 Chronic obstructive pulmonary disease with acute lower respiratory infection: Secondary | ICD-10-CM

## 2018-09-26 DIAGNOSIS — R0602 Shortness of breath: Secondary | ICD-10-CM

## 2018-09-26 DIAGNOSIS — F419 Anxiety disorder, unspecified: Secondary | ICD-10-CM | POA: Insufficient documentation

## 2018-09-26 DIAGNOSIS — F03918 Unspecified dementia, unspecified severity, with other behavioral disturbance: Secondary | ICD-10-CM | POA: Diagnosis present

## 2018-09-26 DIAGNOSIS — G20A1 Parkinson's disease without dyskinesia, without mention of fluctuations: Secondary | ICD-10-CM | POA: Diagnosis present

## 2018-09-26 DIAGNOSIS — F418 Other specified anxiety disorders: Secondary | ICD-10-CM | POA: Diagnosis present

## 2018-09-26 DIAGNOSIS — R404 Transient alteration of awareness: Secondary | ICD-10-CM | POA: Diagnosis not present

## 2018-09-26 DIAGNOSIS — R062 Wheezing: Secondary | ICD-10-CM | POA: Diagnosis not present

## 2018-09-26 LAB — CBC WITH DIFFERENTIAL/PLATELET
Abs Immature Granulocytes: 0.03 10*3/uL (ref 0.00–0.07)
Basophils Absolute: 0 10*3/uL (ref 0.0–0.1)
Basophils Relative: 0 %
Eosinophils Absolute: 0 10*3/uL (ref 0.0–0.5)
Eosinophils Relative: 0 %
HCT: 34.9 % — ABNORMAL LOW (ref 36.0–46.0)
Hemoglobin: 9.8 g/dL — ABNORMAL LOW (ref 12.0–15.0)
Immature Granulocytes: 0 %
Lymphocytes Relative: 3 %
Lymphs Abs: 0.4 10*3/uL — ABNORMAL LOW (ref 0.7–4.0)
MCH: 20.5 pg — ABNORMAL LOW (ref 26.0–34.0)
MCHC: 28.1 g/dL — ABNORMAL LOW (ref 30.0–36.0)
MCV: 73.2 fL — ABNORMAL LOW (ref 80.0–100.0)
Monocytes Absolute: 0.2 10*3/uL (ref 0.1–1.0)
Monocytes Relative: 2 %
Neutro Abs: 10.2 10*3/uL — ABNORMAL HIGH (ref 1.7–7.7)
Neutrophils Relative %: 95 %
Platelets: 231 10*3/uL (ref 150–400)
RBC: 4.77 MIL/uL (ref 3.87–5.11)
RDW: 17.6 % — ABNORMAL HIGH (ref 11.5–15.5)
WBC: 10.8 10*3/uL — ABNORMAL HIGH (ref 4.0–10.5)
nRBC: 0 % (ref 0.0–0.2)

## 2018-09-26 LAB — COMPREHENSIVE METABOLIC PANEL
ALT: 9 U/L (ref 0–44)
AST: 18 U/L (ref 15–41)
Albumin: 3.4 g/dL — ABNORMAL LOW (ref 3.5–5.0)
Alkaline Phosphatase: 89 U/L (ref 38–126)
Anion gap: 9 (ref 5–15)
BUN: 12 mg/dL (ref 8–23)
CO2: 24 mmol/L (ref 22–32)
Calcium: 8.3 mg/dL — ABNORMAL LOW (ref 8.9–10.3)
Chloride: 108 mmol/L (ref 98–111)
Creatinine, Ser: 0.75 mg/dL (ref 0.44–1.00)
GFR calc Af Amer: >60 mL/min (ref >60)
GFR calc non Af Amer: >60 mL/min (ref >60)
Glucose, Bld: 126 mg/dL — ABNORMAL HIGH (ref 70–99)
Potassium: 3.9 mmol/L (ref 3.5–5.1)
Sodium: 141 mmol/L (ref 135–145)
Total Bilirubin: 0.4 mg/dL (ref 0.3–1.2)
Total Protein: 8.1 g/dL (ref 6.5–8.1)

## 2018-09-26 LAB — URINALYSIS, ROUTINE W REFLEX MICROSCOPIC
Bacteria, UA: NONE SEEN
Bilirubin Urine: NEGATIVE
Glucose, UA: NEGATIVE mg/dL
Ketones, ur: NEGATIVE mg/dL
Leukocytes, UA: NEGATIVE
Nitrite: NEGATIVE
Protein, ur: NEGATIVE mg/dL
Specific Gravity, Urine: 1.015 (ref 1.005–1.030)
pH: 7 (ref 5.0–8.0)

## 2018-09-26 LAB — BRAIN NATRIURETIC PEPTIDE: B Natriuretic Peptide: 129 pg/mL — ABNORMAL HIGH (ref 0.0–100.0)

## 2018-09-26 LAB — TROPONIN I: Troponin I: 0.05 ng/mL (ref <0.03)

## 2018-09-26 LAB — INFLUENZA PANEL BY PCR (TYPE A & B)
Influenza A By PCR: NEGATIVE
Influenza B By PCR: NEGATIVE

## 2018-09-26 MED ORDER — BENZTROPINE MESYLATE 1 MG PO TABS
1.0000 mg | ORAL_TABLET | Freq: Every day | ORAL | Status: DC
  Administered 2018-09-26 – 2018-09-28 (×3): 1 mg via ORAL
  Filled 2018-09-26 (×3): qty 1

## 2018-09-26 MED ORDER — METOPROLOL TARTRATE 25 MG PO TABS
25.0000 mg | ORAL_TABLET | Freq: Two times a day (BID) | ORAL | Status: DC
  Administered 2018-09-26 – 2018-09-28 (×4): 25 mg via ORAL
  Filled 2018-09-26 (×4): qty 1

## 2018-09-26 MED ORDER — RIVASTIGMINE 9.5 MG/24HR TD PT24
9.5000 mg | MEDICATED_PATCH | Freq: Every day | TRANSDERMAL | Status: DC
  Administered 2018-09-26 – 2018-09-27 (×2): 9.5 mg via TRANSDERMAL
  Filled 2018-09-26 (×6): qty 1

## 2018-09-26 MED ORDER — ALBUTEROL SULFATE (2.5 MG/3ML) 0.083% IN NEBU
2.5000 mg | INHALATION_SOLUTION | RESPIRATORY_TRACT | Status: DC | PRN
  Administered 2018-09-27: 2.5 mg via RESPIRATORY_TRACT
  Filled 2018-09-26: qty 3

## 2018-09-26 MED ORDER — LEVOFLOXACIN IN D5W 500 MG/100ML IV SOLN
500.0000 mg | Freq: Once | INTRAVENOUS | Status: AC
  Administered 2018-09-26: 500 mg via INTRAVENOUS
  Filled 2018-09-26: qty 100

## 2018-09-26 MED ORDER — ACETAMINOPHEN 650 MG RE SUPP: 650.0000 mg | Freq: Four times a day (QID) | RECTAL | Status: DC | PRN

## 2018-09-26 MED ORDER — IPRATROPIUM-ALBUTEROL 0.5-2.5 (3) MG/3ML IN SOLN
3.0000 mL | Freq: Four times a day (QID) | RESPIRATORY_TRACT | Status: DC
  Administered 2018-09-26 – 2018-09-27 (×5): 3 mL via RESPIRATORY_TRACT
  Filled 2018-09-26 (×5): qty 3

## 2018-09-26 MED ORDER — ACETAMINOPHEN 650 MG RE SUPP
650.0000 mg | Freq: Once | RECTAL | Status: AC
  Administered 2018-09-26: 650 mg via RECTAL
  Filled 2018-09-26: qty 1

## 2018-09-26 MED ORDER — HYDROCODONE-ACETAMINOPHEN 5-325 MG PO TABS: 1.0000 | ORAL_TABLET | Freq: Four times a day (QID) | ORAL | 0 refills | Status: DC | PRN

## 2018-09-26 MED ORDER — ALBUTEROL SULFATE (2.5 MG/3ML) 0.083% IN NEBU
2.5000 mg | INHALATION_SOLUTION | Freq: Once | RESPIRATORY_TRACT | Status: AC
  Administered 2018-09-26: 2.5 mg via RESPIRATORY_TRACT
  Filled 2018-09-26: qty 3

## 2018-09-26 MED ORDER — SODIUM CHLORIDE 0.9 % IV BOLUS
500.0000 mL | Freq: Once | INTRAVENOUS | Status: AC
  Administered 2018-09-26: 500 mL via INTRAVENOUS

## 2018-09-26 MED ORDER — RACEPINEPHRINE HCL 2.25 % IN NEBU
0.5000 mL | INHALATION_SOLUTION | Freq: Once | RESPIRATORY_TRACT | Status: AC
  Administered 2018-09-26: 0.5 mL via RESPIRATORY_TRACT
  Filled 2018-09-26: qty 0.5

## 2018-09-26 MED ORDER — ACETAMINOPHEN 325 MG PO TABS
650.0000 mg | ORAL_TABLET | Freq: Four times a day (QID) | ORAL | Status: DC | PRN
  Filled 2018-09-26: qty 2

## 2018-09-26 MED ORDER — ALPRAZOLAM 0.5 MG PO TABS
0.5000 mg | ORAL_TABLET | Freq: Three times a day (TID) | ORAL | Status: DC
  Administered 2018-09-26 – 2018-09-28 (×5): 0.5 mg via ORAL
  Filled 2018-09-26 (×5): qty 1

## 2018-09-26 MED ORDER — METHYLPREDNISOLONE SODIUM SUCC 125 MG IJ SOLR
125.0000 mg | Freq: Once | INTRAMUSCULAR | Status: AC
  Administered 2018-09-26: 125 mg via INTRAVENOUS
  Filled 2018-09-26: qty 2

## 2018-09-26 MED ORDER — IPRATROPIUM-ALBUTEROL 0.5-2.5 (3) MG/3ML IN SOLN
3.0000 mL | Freq: Once | RESPIRATORY_TRACT | Status: AC
  Administered 2018-09-26: 3 mL via RESPIRATORY_TRACT
  Filled 2018-09-26: qty 3

## 2018-09-26 MED ORDER — ASPIRIN EC 81 MG PO TBEC
81.0000 mg | DELAYED_RELEASE_TABLET | Freq: Every day | ORAL | Status: DC
  Administered 2018-09-26 – 2018-09-28 (×3): 81 mg via ORAL
  Filled 2018-09-26 (×3): qty 1

## 2018-09-26 MED ORDER — CITALOPRAM HYDROBROMIDE 20 MG PO TABS
20.0000 mg | ORAL_TABLET | Freq: Every morning | ORAL | Status: DC
  Administered 2018-09-27 – 2018-09-28 (×2): 20 mg via ORAL
  Filled 2018-09-26 (×3): qty 1

## 2018-09-26 MED ORDER — FUROSEMIDE 20 MG PO TABS
20.0000 mg | ORAL_TABLET | Freq: Every day | ORAL | Status: DC
  Administered 2018-09-26 – 2018-09-28 (×3): 20 mg via ORAL
  Filled 2018-09-26 (×3): qty 1

## 2018-09-26 MED ORDER — HEPARIN SODIUM (PORCINE) 5000 UNIT/ML IJ SOLN
5000.0000 [IU] | Freq: Three times a day (TID) | INTRAMUSCULAR | Status: DC
  Administered 2018-09-26 – 2018-09-28 (×5): 5000 [IU] via SUBCUTANEOUS
  Filled 2018-09-26 (×5): qty 1

## 2018-09-26 MED ORDER — GUAIFENESIN ER 600 MG PO TB12
600.0000 mg | ORAL_TABLET | Freq: Two times a day (BID) | ORAL | Status: DC
  Administered 2018-09-26 – 2018-09-28 (×4): 600 mg via ORAL
  Filled 2018-09-26 (×6): qty 1

## 2018-09-26 MED ORDER — SODIUM CHLORIDE 0.9 % IV SOLN
500.0000 mg | INTRAVENOUS | Status: DC
  Administered 2018-09-27: 500 mg via INTRAVENOUS
  Filled 2018-09-26 (×4): qty 500

## 2018-09-26 NOTE — ED Notes (Signed)
Date and time results received: 09/26/18 1:47 PM   Test: troponins  Critical Value: 0.05  Name of Provider Notified: Roderic Palau, MD  Orders Received? Or Actions Taken?: None at this time, will continue to monitor.

## 2018-09-26 NOTE — ED Notes (Signed)
EDP at the bedside, in and out cath, done,  Dementia, SOB, DNR

## 2018-09-26 NOTE — Progress Notes (Addendum)
Unable to do admission, no family at bedside, patient in comprehensible. Patient has dementia. Patient unable to swallow pills will try with applesauce.

## 2018-09-26 NOTE — H&P (Signed)
History and Physical    [name] YNW:[id number] DOB: 24-Apr-1920 DOA: 09/26/2018  PCP: Iona Beard, MD  Patient coming from: Home  I have personally briefly reviewed patient's old medical records in [facility]  Chief Complaint: Chest congestion, wheezing  HPI: [name] is a 85 y.o. female with medical history significant for Parkinson's disease, Alzheimer's dementia, COPD, hypertension, chronic diastolic CHF who presents to the ED for 2 days of wheezing and chest congestion.  Patient has dementia and is unable to provide history, therefore entirety of history is obtained from family, EDP, and chart review.  Patient was in her general state of health which is nonambulatory and in current speech until about 2 days ago when she was noticed to have some shortness of breath, nonproductive cough, and upper airway congestion.  Family called her PCP and was advised to start Mucinex, however they felt her symptoms were worsening and therefore brought her to the ED.  ED Course:  Initial vitals showed BP 176/104, pulse 101, RR 30, temp 102.5 Fahrenheit rectally, SPO2 100% on 2 L O2 via [place].  Labs are notable for WBC 10.8, hemoglobin 9.8 (9.7 on 04/28/2018), weight was 231, BNP 129, troponin I 0.05.  Urinalysis was negative for UTI.  Influenza panel was negative.  Patient was given 500 mL normal saline bolus and IV Solu-Medrol 125 mg.  She also given albuterol, DuoNeb, and racepinephrine nebulizers.  A single blood culture was able to be obtained and patient was given IV Levaquin.  The hospitalist service consulted to admit for further management.  Review of Systems: Unable to obtain from patient due to dementia.   Past Medical History:  Diagnosis Date  . Alzheimer's dementia ([place])   . Cancer (HCC)    breast  . CHF (congestive heart failure) ([place])   . Heart attack ([place])   . Hypertension   . Parkinson disease [facility])     Past Surgical History:  Procedure Laterality Date  . ABDOMINAL  HYSTERECTOMY    . arm surgery    . BREAST SURGERY    . CHOLECYSTECTOMY    . COLON SURGERY    . HIP PINNING,CANNULATED Right 02/01/2013   Procedure: CANNULATED HIP PINNING;  Surgeon: Sanjuana Kava, MD;  Location: [facility];  Service: Orthopedics;  Laterality: Right;  . LEG SURGERY    . MASTECTOMY    . PELVIC FRACTURE SURGERY    . small intestine removed    . TONSILLECTOMY       reports that she has never smoked. She has never used smokeless tobacco. She reports that she does not drink alcohol or use drugs.  Allergies  Allergen Reactions  . Adhesive [Tape] Rash  . Ensure Pudding [Nutritional Supplements] Swelling and Rash    Throat swelling  . Penicillins Swelling and Rash    Has patient had a PCN reaction causing immediate rash, facial/tongue/throat swelling, SOB or lightheadedness with hypotension: Yes Has patient had a PCN reaction causing severe rash involving mucus membranes or skin necrosis: Yes Has patient had a PCN reaction that required hospitalization No Has patient had a PCN reaction occurring within the last 10 years: No If all of the above answers are "NO", then may proceed with Cephalosporin use.    Family History  Problem Relation Age of Onset  . Aneurysm Mother   . Diabetes Father      Prior to Admission medications   Medication Sig Start Date End Date Taking? Authorizing Provider  acetaminophen (TYLENOL) 500 MG  tablet Take 500 mg by mouth at bedtime. For pain   Yes [provider]  albuterol (PROVENTIL) (2.5 MG/3ML) 0.083% nebulizer solution Take 2.5 mg by nebulization 4 (four) times daily.    Yes [provider]  ALPRAZolam Duanne Moron) 0.5 MG tablet Take 0.5 mg by mouth 3 (three) times daily.    Yes [provider]  aspirin EC 81 MG tablet Take 1 tablet (81 mg total) by mouth daily. 04/05/13  Yes Kathie Dike, MD  benztropine (COGENTIN) 1 MG tablet Take 1 tablet (1 mg total) by mouth daily. 04/05/13  Yes Kathie Dike, MD  citalopram  (CELEXA) 20 MG tablet Take 1 tablet (20 mg total) by mouth every morning. 04/05/13  Yes Kathie Dike, MD  furosemide (LASIX) 20 MG tablet Take 1 tablet (20 mg total) by mouth daily. 04/30/18 04/30/19 Yes Erline Hau, MD  metoprolol (LOPRESSOR) 50 MG tablet Take 0.5 tablets (25 mg total) by mouth 2 (two) times daily. 09/24/13  Yes Kathie Dike, MD  rivastigmine (EXELON) 9.5 mg/24hr Place 1 patch (9.5 mg total) onto the skin daily. *Remove and discard used patches* 04/05/13  Yes Memon, Jolaine Artist, MD  zolpidem (AMBIEN) 10 MG tablet Take 10 mg by mouth at bedtime as needed for sleep.   Yes [provider]    Physical Exam: Vitals:   09/26/18 1752 09/26/18 1753 09/26/18 1838 09/26/18 1841  BP:      Pulse:  87 83   Resp:  18 19   Temp: 98.2 F (36.8 C)  98.3 F (36.8 C)   TempSrc: Oral  Oral   SpO2:  98% 96%   Weight:    67.3 kg  Height:       Exam limited due to patient understanding and cooperation Constitutional: Chronically ill-appearing elderly woman lying supine in bed, noncommunicative Eyes: PERRL, lids and conjunctivae normal ENMT: Unable to assess oropharynx as patient kept mouth shut tight Neck: normal, supple, no masses. Respiratory: Upper airway wheezing, normal respiratory effort. No accessory muscle use.   Cardiovascular: Regular rate and rhythm, no murmurs / rubs / gallops. No extremity edema. Abdomen: no tenderness, no masses palpated. No hepatosplenomegaly. Bowel sounds positive.  Musculoskeletal: no clubbing / cyanosis. No joint deformity upper and lower extremities.   Skin: no rashes, lesions, ulcers. No induration Neurologic: Sensation intact, moves all extremities spontaneously  Psychiatric: Noncommunicative, alert but unable to participate in conversation or follow commands   Labs on Admission: I have personally reviewed following labs and imaging studies  CBC: Recent Labs  Lab 09/26/18 1230  WBC 10.8*  NEUTROABS 10.2*  HGB 9.8*  HCT  34.9*  MCV 73.2*  PLT 401   Basic Metabolic Panel: Recent Labs  Lab 09/26/18 1230  NA 141  K 3.9  CL 108  CO2 24  GLUCOSE 126*  BUN 12  CREATININE 0.75  CALCIUM 8.3*   GFR: Estimated Creatinine Clearance: 36.1 mL/min (by C-G formula based on SCr of 0.75 mg/dL). Liver Function Tests: Recent Labs  Lab 09/26/18 1230  AST 18  ALT 9  ALKPHOS 89  BILITOT 0.4  PROT 8.1  ALBUMIN 3.4*   No results for input(s): LIPASE, AMYLASE in the last 168 hours. No results for input(s): AMMONIA in the last 168 hours. Coagulation Profile: No results for input(s): INR, PROTIME in the last 168 hours. Cardiac Enzymes: Recent Labs  Lab 09/26/18 1230  TROPONINI 0.05*   BNP (last 3 results) No results for input(s): PROBNP in the last 8760 hours. HbA1C:  No results for input(s): HGBA1C in the last 72 hours. CBG: No results for input(s): GLUCAP in the last 168 hours. Lipid Profile: No results for input(s): CHOL, HDL, LDLCALC, TRIG, CHOLHDL, LDLDIRECT in the last 72 hours. Thyroid Function Tests: No results for input(s): TSH, T4TOTAL, FREET4, T3FREE, THYROIDAB in the last 72 hours. Anemia Panel: No results for input(s): VITAMINB12, FOLATE, FERRITIN, TIBC, IRON, RETICCTPCT in the last 72 hours. Urine analysis:    Component Value Date/Time   COLORURINE YELLOW 09/26/2018 [place] 09/26/2018 0939   LABSPEC 1.015 09/26/2018 0939   PHURINE 7.0 09/26/2018 0939   GLUCOSEU NEGATIVE 09/26/2018 0939   HGBUR MODERATE (A) 09/26/2018 0939   BILIRUBINUR NEGATIVE 09/26/2018 0939   KETONESUR NEGATIVE 09/26/2018 0939   PROTEINUR NEGATIVE 09/26/2018 0939   UROBILINOGEN 0.2 01/06/2015 1842   NITRITE NEGATIVE 09/26/2018 0939   LEUKOCYTESUR NEGATIVE 09/26/2018 0939    Radiological Exams on Admission: Dg Chest Portable 1 View  Result Date: 09/26/2018 CLINICAL DATA:  Chest congestion, shortness of Breath EXAM: PORTABLE CHEST 1 VIEW COMPARISON:  04/28/2018 FINDINGS: Cardiomegaly with  vascular congestion. Tortuosity of the thoracic aorta. Mild interstitial prominence throughout the lungs likely reflects mild interstitial edema. Low lung volumes with bibasilar atelectasis and suspected small effusions. IMPRESSION: Continued mild edema/CHF. Low lung volumes with bibasilar atelectasis and small effusions. Electronically Signed   By: Rolm Baptise M.D.   On: 09/26/2018 10:00    EKG: Independently reviewed.  Sinus tachycardia with PAC.  Assessment/Plan Principal Problem:   Acute on chronic respiratory failure (HCC) Active Problems:   Dementia with behavioral disturbance (HCC)   Parkinson disease (HCC)   Essential hypertension   Depression with anxiety   Chronic diastolic CHF (congestive heart failure) (HCC)    [name] is a 85 y.o. female with medical history significant for Parkinson's disease, Alzheimer's dementia, COPD, hypertension, chronic diastolic CHF who presents to the ED for 2 days of wheezing and chest congestion admitted with acute on chronic respiratory failure.  Acute on chronic respiratory failure: Likely secondary to viral URI/bronchitis and contribution from COPD.  No obvious pneumonia on chest x-ray. -Schedule duo nebs, PRN albuterol nebulizers -Switch antibiotics to IV azithromycin -Supplemental oxygen as needed, wean off as able -Mucinex  Chronic diastolic CHF: Appears euvolemic on exam, mild edema noted on chest x-ray.  Status post total 1 L IV fluids. -Restart home Lasix -Continue Lopressor  Hypertension: Initially hypertensive on admission. -Continue home Lopressor  Parkinson's/Alzheimer's dementia: -Continue Cogentin and Exelon patch  Depression/anxiety: -Continue home Celexa and Xanax   DVT prophylaxis: subq heparin Code Status: DNR/DNI confirmed with daughter Family Communication: Discussed with daughter and sister at bedside Disposition Plan: Pending clinical progress Consults called: None Admission status:  Observation   Zada Finders MD [facility] Pager [phone]  If 7PM-7AM, please contact night-coverage [email or web address] Password [facility]  09/26/2018, 7:28 PM

## 2018-09-26 NOTE — ED Notes (Signed)
Unable to obtain any blood from patient to run an [place].  MD made aware of unable to get blood

## 2018-09-26 NOTE — ED Triage Notes (Signed)
Pt from home. Brought in for SOB.  Per family, pt has been having increased sob x 2 days.

## 2018-09-26 NOTE — ED Notes (Signed)
Lab able to get one set of cultures. [name], MD notified of patient's blood cultures. Dr. [name] stated if we could not get second set then one set was okay.

## 2018-09-26 NOTE — ED Notes (Addendum)
Patient's IV infiltrated. Switched fluids to left arm IV, patient's family approved. Pt has blood work due but cannot get blood on pt at this time. Attempted to notify lab, no response at this time. Roderic Palau, MD aware.

## 2018-09-26 NOTE — ED Notes (Signed)
Respiratory notified of breathing treatment.

## 2018-09-26 NOTE — ED Notes (Signed)
Turned pt's O2 down to 2L due to patient's O2 sat @100 %. Will monitor how pt does at 2L

## 2018-09-26 NOTE — ED Notes (Signed)
Patient's family called to notify me that patient had a "sneexing fit". Sat pt up in bed and pt started coughing. Pt O2 sat at 98%. Will continue to monitor.

## 2018-09-26 NOTE — ED Provider Notes (Addendum)
[facility] EMERGENCY DEPARTMENT Provider Note   CSN: [id number] Arrival date & time: 09/26/18  2229     History   Chief Complaint Chief Complaint  Patient presents with  . Shortness of Breath    HPI [name] is a 85 y.o. female.  Patient brought to the emergency department because she has been short of breath and wheezing.  Patient has dementia and is a DNR  The history is provided by a relative. No language interpreter was used.  Shortness of Breath  Severity:  Moderate Onset quality:  Sudden Duration:  1 day Timing:  Constant Progression:  Worsening Chronicity:  New Context: not activity   Relieved by:  Nothing Worsened by:  Nothing Ineffective treatments:  None tried Associated symptoms: no abdominal pain     Past Medical History:  Diagnosis Date  . Alzheimer's dementia ([place])   . Cancer (HCC)    breast  . CHF (congestive heart failure) ([place])   . Heart attack ([place])   . Hypertension   . Parkinson disease [facility])     Patient Active Problem List   Diagnosis Date Noted  . Acute on chronic diastolic CHF (congestive heart failure) ([place]) 04/28/2018  . Dysarthria 10/18/2017  . Acute metabolic encephalopathy 79/89/2119  . Chronic diastolic CHF (congestive heart failure) ([place]) 10/17/2017  . Fever   . Acute bronchitis 10/16/2017  . Dyspnea 01/06/2015  . Essential hypertension 01/06/2015  . Depression with anxiety 01/06/2015  . Insomnia 01/06/2015  . Acute on chronic diastolic congestive heart failure ([place])   . Lesion of liver 09/17/2013  . Microcytic anemia 04/05/2013  . Elevated troponin 04/04/2013  . Fall 01/31/2013  . Dementia with behavioral disturbance ([place]) 01/31/2013  . Parkinson disease ([place]) 01/31/2013  . Community acquired pneumonia 06/30/2012  . Weakness generalized 06/30/2012    Past Surgical History:  Procedure Laterality Date  . ABDOMINAL HYSTERECTOMY    . arm surgery    . BREAST SURGERY    . CHOLECYSTECTOMY    . COLON SURGERY    .  HIP PINNING,CANNULATED Right 02/01/2013   Procedure: CANNULATED HIP PINNING;  Surgeon: Sanjuana Kava, MD;  Location: [facility];  Service: Orthopedics;  Laterality: Right;  . LEG SURGERY    . MASTECTOMY    . PELVIC FRACTURE SURGERY    . small intestine removed    . TONSILLECTOMY       OB History    Gravida  4   Para  4   Term  4   Preterm      AB      Living        SAB      TAB      Ectopic      Multiple      Live Births               Home Medications    Prior to Admission medications   Medication Sig Start Date End Date Taking? Authorizing Provider  acetaminophen (TYLENOL) 500 MG tablet Take 500 mg by mouth at bedtime. For pain   Yes [provider]  albuterol (PROVENTIL) (2.5 MG/3ML) 0.083% nebulizer solution Take 2.5 mg by nebulization 4 (four) times daily.    Yes [provider]  ALPRAZolam Duanne Moron) 0.5 MG tablet Take 0.5 mg by mouth 3 (three) times daily.    Yes [provider]  aspirin EC 81 MG tablet Take 1 tablet (81 mg total) by mouth daily. 04/05/13  Yes Kathie Dike, MD  benztropine (COGENTIN) 1 MG tablet Take 1 tablet (1 mg total) by mouth daily. 04/05/13  Yes Kathie Dike, MD  citalopram (CELEXA) 20 MG tablet Take 1 tablet (20 mg total) by mouth every morning. 04/05/13  Yes Kathie Dike, MD  furosemide (LASIX) 20 MG tablet Take 1 tablet (20 mg total) by mouth daily. 04/30/18 04/30/19 Yes Erline Hau, MD  metoprolol (LOPRESSOR) 50 MG tablet Take 0.5 tablets (25 mg total) by mouth 2 (two) times daily. 09/24/13  Yes Kathie Dike, MD  rivastigmine (EXELON) 9.5 mg/24hr Place 1 patch (9.5 mg total) onto the skin daily. *Remove and discard used patches* 04/05/13  Yes Memon, Jolaine Artist, MD  zolpidem (AMBIEN) 10 MG tablet Take 10 mg by mouth at bedtime as needed for sleep.   Yes [provider]    Family History Family History  Problem Relation Age of Onset  . Aneurysm Mother   . Diabetes Father      Social History Social History   Tobacco Use  . Smoking status: Never Smoker  . Smokeless tobacco: Never Used  Substance Use Topics  . Alcohol use: No  . Drug use: No     Allergies   Adhesive [tape]; Ensure pudding [nutritional supplements]; and Penicillins   Review of Systems Review of Systems  Unable to perform ROS: Dementia  Respiratory: Positive for shortness of breath.   Gastrointestinal: Negative for abdominal pain.     Physical Exam Updated Vital Signs BP (!) 120/105   Pulse (!) 106   Temp (!) 101.5 F (38.6 C) (Axillary)   Resp 19   Ht 5\' 1"  (1.549 m)   Wt 66.2 kg   SpO2 96%   BMI 27.59 kg/m   Physical Exam Constitutional:      Appearance: She is well-developed. She is obese.  HENT:     Head: Normocephalic.     Nose: Nose normal.  Eyes:     General: No scleral icterus.    Conjunctiva/sclera: Conjunctivae normal.  Neck:     Musculoskeletal: Neck supple.     Thyroid: No thyromegaly.  Cardiovascular:     Rate and Rhythm: Regular rhythm.     Heart sounds: No murmur. No friction rub. No gallop.      Comments: Tachycardia Pulmonary:     Breath sounds: No stridor. Wheezing present. No rales.  Chest:     Chest wall: No tenderness.  Abdominal:     General: There is no distension.     Tenderness: There is no abdominal tenderness. There is no rebound.  Musculoskeletal: Normal range of motion.  Lymphadenopathy:     Cervical: No cervical adenopathy.  Skin:    Capillary Refill: Capillary refill takes 2 to 3 seconds.     Findings: No erythema or rash.  Neurological:     Motor: No abnormal muscle tone.     Coordination: Coordination normal.     Comments: Patient lethargic and not oriented      ED Treatments / Results  Labs (all labs ordered are listed, but only abnormal results are displayed) Labs Reviewed  CBC WITH DIFFERENTIAL/PLATELET - Abnormal; Notable for the following components:      Result Value   WBC 10.8 (*)    Hemoglobin 9.8  (*)    HCT 34.9 (*)    MCV 73.2 (*)    MCH 20.5 (*)    MCHC 28.1 (*)    RDW 17.6 (*)    Neutro Abs 10.2 (*)    Lymphs Abs 0.4 (*)  All other components within normal limits  COMPREHENSIVE METABOLIC PANEL - Abnormal; Notable for the following components:   Glucose, Bld 126 (*)    Calcium 8.3 (*)    Albumin 3.4 (*)    All other components within normal limits  BRAIN NATRIURETIC PEPTIDE - Abnormal; Notable for the following components:   B Natriuretic Peptide 129.0 (*)    All other components within normal limits  TROPONIN I - Abnormal; Notable for the following components:   Troponin I 0.05 (*)    All other components within normal limits  URINALYSIS, ROUTINE W REFLEX MICROSCOPIC - Abnormal; Notable for the following components:   Hgb urine dipstick MODERATE (*)    All other components within normal limits  CULTURE, BLOOD (ROUTINE X 2)  CULTURE, BLOOD (ROUTINE X 2)  INFLUENZA PANEL BY PCR (TYPE A & B)  I-STAT CG4 LACTIC ACID, ED    EKG None  Radiology Dg Chest Portable 1 View  Result Date: 09/26/2018 CLINICAL DATA:  Chest congestion, shortness of Breath EXAM: PORTABLE CHEST 1 VIEW COMPARISON:  04/28/2018 FINDINGS: Cardiomegaly with vascular congestion. Tortuosity of the thoracic aorta. Mild interstitial prominence throughout the lungs likely reflects mild interstitial edema. Low lung volumes with bibasilar atelectasis and suspected small effusions. IMPRESSION: Continued mild edema/CHF. Low lung volumes with bibasilar atelectasis and small effusions. Electronically Signed   By: Rolm Baptise M.D.   On: 09/26/2018 10:00    Procedures Procedures (including critical care time)  Medications Ordered in ED Medications  sodium chloride 0.9 % bolus 500 mL (500 mLs Intravenous New Bag/Given 09/26/18 1437)  levofloxacin (LEVAQUIN) IVPB 500 mg (500 mg Intravenous New Bag/Given 09/26/18 1434)  methylPREDNISolone sodium succinate (SOLU-MEDROL) 125 mg/2 mL injection 125 mg (125 mg Intravenous  Given 09/26/18 1037)  ipratropium-albuterol (DUONEB) 0.5-2.5 (3) MG/3ML nebulizer solution 3 mL (3 mLs Nebulization Given 09/26/18 0955)  albuterol (PROVENTIL) (2.5 MG/3ML) 0.083% nebulizer solution 2.5 mg (2.5 mg Nebulization Given 09/26/18 0955)  Racepinephrine HCl 2.25 % nebulizer solution 0.5 mL (0.5 mLs Nebulization Given 09/26/18 1342)  acetaminophen (TYLENOL) suppository 650 mg (650 mg Rectal Given 09/26/18 1437)     Initial Impression / Assessment and Plan / ED Course  I have reviewed the triage vital signs and the nursing notes.  Pertinent labs & imaging results that were available during my care of the patient were reviewed by me and considered in my medical decision making (see chart for details).     CRITICAL CARE Performed by: Milton Ferguson Total critical care time: 40 minutes Critical care time was exclusive of separately billable procedures and treating other patients. Critical care was necessary to treat or prevent imminent or life-threatening deterioration. Critical care was time spent personally by me on the following activities: development of treatment plan with patient and/or surrogate as well as nursing, discussions with consultants, evaluation of patient's response to treatment, examination of patient, obtaining history from patient or surrogate, ordering and performing treatments and interventions, ordering and review of laboratory studies, ordering and review of radiographic studies, pulse oximetry and re-evaluation of patient's condition. Patient with respiratory infection.  Patient has fever and is dyspneic with wheezing.  Chest x-ray does not show any pneumonia but suspect respiratory infection.  Patient has been placed on antibiotics to cover pneumonia and will be admitted to medicine.  She is a DNR/DNI  Final Clinical Impressions(s) / ED Diagnoses   Final diagnoses:  None    ED Discharge Orders    None  Milton Ferguson, MD 09/26/18 8883    Milton Ferguson,  MD 09/26/18 1515

## 2018-09-26 NOTE — ED Notes (Signed)
Called RT

## 2018-09-27 DIAGNOSIS — F0281 Dementia in other diseases classified elsewhere with behavioral disturbance: Secondary | ICD-10-CM | POA: Diagnosis not present

## 2018-09-27 DIAGNOSIS — I10 Essential (primary) hypertension: Secondary | ICD-10-CM | POA: Diagnosis not present

## 2018-09-27 DIAGNOSIS — J9620 Acute and chronic respiratory failure, unspecified whether with hypoxia or hypercapnia: Secondary | ICD-10-CM | POA: Diagnosis not present

## 2018-09-27 DIAGNOSIS — F418 Other specified anxiety disorders: Secondary | ICD-10-CM

## 2018-09-27 DIAGNOSIS — G20 Parkinson's disease: Secondary | ICD-10-CM

## 2018-09-27 DIAGNOSIS — I5032 Chronic diastolic (congestive) heart failure: Secondary | ICD-10-CM | POA: Diagnosis not present

## 2018-09-27 LAB — BASIC METABOLIC PANEL
Anion gap: 5 (ref 5–15)
BUN: 19 mg/dL (ref 8–23)
CO2: 26 mmol/L (ref 22–32)
Calcium: 8.5 mg/dL — ABNORMAL LOW (ref 8.9–10.3)
Chloride: 110 mmol/L (ref 98–111)
Creatinine, Ser: 0.87 mg/dL (ref 0.44–1.00)
GFR calc Af Amer: >60 mL/min (ref >60)
GFR calc non Af Amer: 56 mL/min — ABNORMAL LOW (ref >60)
Glucose, Bld: 150 mg/dL — ABNORMAL HIGH (ref 70–99)
Potassium: 4.4 mmol/L (ref 3.5–5.1)
Sodium: 141 mmol/L (ref 135–145)

## 2018-09-27 LAB — CBC
HCT: 34.5 % — ABNORMAL LOW (ref 36.0–46.0)
Hemoglobin: 9.5 g/dL — ABNORMAL LOW (ref 12.0–15.0)
MCH: 20.6 pg — ABNORMAL LOW (ref 26.0–34.0)
MCHC: 27.5 g/dL — ABNORMAL LOW (ref 30.0–36.0)
MCV: 74.8 fL — ABNORMAL LOW (ref 80.0–100.0)
Platelets: 243 10*3/uL (ref 150–400)
RBC: 4.61 MIL/uL (ref 3.87–5.11)
RDW: 17.4 % — ABNORMAL HIGH (ref 11.5–15.5)
WBC: 10.9 10*3/uL — ABNORMAL HIGH (ref 4.0–10.5)
nRBC: 0 % (ref 0.0–0.2)

## 2018-09-27 MED ORDER — LABETALOL HCL 5 MG/ML IV SOLN
20.0000 mg | Freq: Once | INTRAVENOUS | Status: AC
  Administered 2018-09-27: 20 mg via INTRAVENOUS
  Filled 2018-09-27: qty 4

## 2018-09-27 MED ORDER — FUROSEMIDE 10 MG/ML IJ SOLN
20.0000 mg | Freq: Once | INTRAMUSCULAR | Status: AC
  Administered 2018-09-28: 20 mg via INTRAVENOUS
  Filled 2018-09-27: qty 2

## 2018-09-27 MED ORDER — FUROSEMIDE 10 MG/ML IJ SOLN
INTRAMUSCULAR | Status: AC
  Administered 2018-09-27: 20 mg
  Filled 2018-09-27: qty 2

## 2018-09-27 MED ORDER — METHYLPREDNISOLONE SODIUM SUCC 40 MG IJ SOLR
40.0000 mg | Freq: Two times a day (BID) | INTRAMUSCULAR | Status: DC
  Administered 2018-09-27 – 2018-09-28 (×2): 40 mg via INTRAVENOUS
  Filled 2018-09-27 (×2): qty 1

## 2018-09-27 MED ORDER — DOXYCYCLINE HYCLATE 100 MG PO TABS
100.0000 mg | ORAL_TABLET | Freq: Two times a day (BID) | ORAL | Status: DC
  Administered 2018-09-27 – 2018-09-28 (×2): 100 mg via ORAL
  Filled 2018-09-27 (×2): qty 1

## 2018-09-27 MED ORDER — IPRATROPIUM-ALBUTEROL 0.5-2.5 (3) MG/3ML IN SOLN
3.0000 mL | Freq: Four times a day (QID) | RESPIRATORY_TRACT | Status: DC
  Administered 2018-09-28 (×2): 3 mL via RESPIRATORY_TRACT
  Filled 2018-09-27 (×2): qty 3

## 2018-09-27 MED ORDER — BUDESONIDE 0.25 MG/2ML IN SUSP
0.2500 mg | Freq: Two times a day (BID) | RESPIRATORY_TRACT | Status: DC
  Administered 2018-09-27 – 2018-09-28 (×2): 0.25 mg via RESPIRATORY_TRACT
  Filled 2018-09-27 (×2): qty 2

## 2018-09-27 MED ORDER — HYDRALAZINE HCL 20 MG/ML IJ SOLN
10.0000 mg | Freq: Three times a day (TID) | INTRAMUSCULAR | Status: DC | PRN
  Administered 2018-09-27: 10 mg via INTRAVENOUS
  Filled 2018-09-27 (×2): qty 1

## 2018-09-27 NOTE — Plan of Care (Signed)

## 2018-09-27 NOTE — Progress Notes (Signed)
PROGRESS NOTE    [name]  EVO:[id number] DOB: 02-18-1920 DOA: 09/26/2018 PCP: Iona Beard, MD     Brief Narrative:  85 y.o. female with medical history significant for Parkinson's disease, Alzheimer's dementia, COPD, hypertension, chronic diastolic CHF who presents to the ED for 2 days of wheezing and chest congestion.  Assessment & Plan: Acute on chronic respiratory failure (HCC) -In the setting of COPD exacerbation and bronchitis -Patient is still with significant wheezing and tachypneic with minimal exertion. -Continue IV Solu-Medrol, PRN DuoNeb and Pulmicort -Doxycycline for bronchitic component -Continue oxygen supplementation and follow clinical response.  Parkinson's disease/dementia with behavioral disturbance ([place]) -Continue supportive care -Continue continue Exelon patch Continue Celexa and as needed Xanax.  Essential hypertension: Currently uncontrolled and regionally positive systolic 299/BZJIRCVEL 381 -Continue Lasix -Continue metoprolol -PRN labetalol and hydralazine -Low-sodium diet. -Follow vital signs and further adjust antihypertensive regimen as needed.  Chronic diastolic CHF (congestive heart failure) (HCC) -follow daily weight -continue lasix and b-blocker -follow strict intake and output.   DVT prophylaxis: Heparin Code Status: DNR/DNI. Family Communication: Daughter at bedside. Disposition Plan: Hopefully discharge home in the next 24/48 hours once breathing and blood pressure control.  Consultants:   None   Procedures:   See below for x-ray reports.  Antimicrobials:  Anti-infectives (From admission, onward)   Start     Dose/Rate Route Frequency Ordered Stop   09/27/18 1400  azithromycin (ZITHROMAX) 500 mg in sodium chloride 0.9 % 250 mL IVPB  Status:  Discontinued     500 mg 250 mL/hr over 60 Minutes Intravenous Every 24 hours 09/26/18 1624 09/27/18 1605   09/26/18 1430  levofloxacin (LEVAQUIN) IVPB 500 mg     500 mg 100 mL/hr  over 60 Minutes Intravenous  Once 09/26/18 1422 09/26/18 1600       Subjective: Afebrile, disoriented and with mild anxiety during examination.  Appreciated to have tachypnea with minimal exertion, shortness of breath and diffuse wheezing.  Patient able to follow very simple commands, but noncommunicative.  Objective: Vitals:   09/27/18 1000 09/27/18 1309 09/27/18 1428 09/27/18 1551  BP:   (!) 153/131 (!) 185/137  Pulse:   79 (!) 150  Resp:   20 20  Temp:    98.5 F (36.9 C)  TempSrc:    Oral  SpO2: 99% 98% 98% 97%  Weight:      Height:        Intake/Output Summary (Last 24 hours) at 09/27/2018 1627 Last data filed at 09/27/2018 0900 Gross per 24 hour  Intake 240 ml  Output -  Net 240 ml   Filed Weights   09/26/18 1124 09/26/18 1841  Weight: 66.2 kg 67.3 kg    Examination: General exam: Pleasantly confused and disoriented.  Afebrile.  Short of breath with minimal exertion.  No having any pain. Respiratory system: Diffuse expiratory wheezing; positive rhonchi.  No using accessory muscles.  Good oxygen saturation on 2 L nasal cannula. Cardiovascular system:RRR. No murmurs, rubs, gallops. Gastrointestinal system: Abdomen is nondistended, soft and nontender. No organomegaly or masses felt. Normal bowel sounds heard. Central nervous system: Alert and oriented. No focal neurological deficits. Extremities: No cyanosis or clubbing.  Trace edema bilaterally. Skin: No rashes, no petechiae. Psychiatry: Judgement and insight impair in the setting of dementia.   Data Reviewed: I have personally reviewed following labs and imaging studies  CBC: Recent Labs  Lab 09/26/18 1230 09/27/18 0610  WBC 10.8* 10.9*  NEUTROABS 10.2*  --   HGB 9.8* 9.5*  HCT  34.9* 34.5*  MCV 73.2* 74.8*  PLT 231 737   Basic Metabolic Panel: Recent Labs  Lab 09/26/18 1230 09/27/18 0610  NA 141 141  K 3.9 4.4  CL 108 110  CO2 24 26  GLUCOSE 126* 150*  BUN 12 19  CREATININE 0.75 0.87  CALCIUM  8.3* 8.5*   GFR: Estimated Creatinine Clearance: 33.2 mL/min (by C-G formula based on SCr of 0.87 mg/dL).   Liver Function Tests: Recent Labs  Lab 09/26/18 1230  AST 18  ALT 9  ALKPHOS 89  BILITOT 0.4  PROT 8.1  ALBUMIN 3.4*   Cardiac Enzymes: Recent Labs  Lab 09/26/18 1230  TROPONINI 0.05*   Urine analysis:    Component Value Date/Time   COLORURINE YELLOW 09/26/2018 [place] 09/26/2018 0939   LABSPEC 1.015 09/26/2018 0939   PHURINE 7.0 09/26/2018 0939   GLUCOSEU NEGATIVE 09/26/2018 0939   HGBUR MODERATE (A) 09/26/2018 0939   BILIRUBINUR NEGATIVE 09/26/2018 0939   KETONESUR NEGATIVE 09/26/2018 0939   PROTEINUR NEGATIVE 09/26/2018 0939   UROBILINOGEN 0.2 01/06/2015 1842   NITRITE NEGATIVE 09/26/2018 0939   LEUKOCYTESUR NEGATIVE 09/26/2018 1062    Recent Results (from the past 240 hour(s))  Blood Culture (routine x 2)     Status: None (Preliminary result)   Collection Time: 09/26/18  5:04 PM  Result Value Ref Range Status   Specimen Description BLOOD RIGHT HAND  Final   Special Requests   Final    BOTTLES DRAWN AEROBIC AND ANAEROBIC Blood Culture results may not be optimal due to an inadequate volume of blood received in culture bottles   Culture   Final    NO GROWTH < 12 HOURS Performed at [facility], [address], [place], [place] [address]    Report Status PENDING  Incomplete  Culture, blood (Routine X 2) w Reflex to ID Panel     Status: None (Preliminary result)   Collection Time: 09/27/18  6:10 AM  Result Value Ref Range Status   Specimen Description RIGHT ANTECUBITAL  Final   Special Requests   Final    BOTTLES DRAWN AEROBIC AND ANAEROBIC Blood Culture results may not be optimal due to an inadequate volume of blood received in culture bottles Performed at [facility], [address], [place], [place] [address]    Culture PENDING  Incomplete   Report Status PENDING  Incomplete     Radiology Studies: Dg Chest Portable 1  View  Result Date: 09/26/2018 CLINICAL DATA:  Chest congestion, shortness of Breath EXAM: PORTABLE CHEST 1 VIEW COMPARISON:  04/28/2018 FINDINGS: Cardiomegaly with vascular congestion. Tortuosity of the thoracic aorta. Mild interstitial prominence throughout the lungs likely reflects mild interstitial edema. Low lung volumes with bibasilar atelectasis and suspected small effusions. IMPRESSION: Continued mild edema/CHF. Low lung volumes with bibasilar atelectasis and small effusions. Electronically Signed   By: Rolm Baptise M.D.   On: 09/26/2018 10:00   Scheduled Meds: . ALPRAZolam  0.5 mg Oral TID  . aspirin EC  81 mg Oral Daily  . benztropine  1 mg Oral Daily  . budesonide (PULMICORT) nebulizer solution  0.25 mg Nebulization BID  . citalopram  20 mg Oral q morning - 10a  . furosemide  20 mg Oral Daily  . guaiFENesin  600 mg Oral BID  . heparin  5,000 Units Subcutaneous Q8H  . ipratropium-albuterol  3 mL Nebulization Q6H  . methylPREDNISolone (SOLU-MEDROL) injection  40 mg Intravenous Q12H  . metoprolol tartrate  25 mg Oral  BID  . rivastigmine  9.5 mg Transdermal Daily   Continuous Infusions:   LOS: 0 days    Time spent: 30 minutes    Barton Dubois, MD [facility] Pager [phone]  If 7PM-7AM, please contact night-coverage [email or web address] Password [facility] 09/27/2018, 4:27 PM

## 2018-09-27 NOTE — Care Management Obs Status (Signed)
[place] NOTIFICATION   Patient Details  Name: [name] MRN: [id number] Date of Birth: [birth year]   Medicare Observation Status Notification Given:  Yes    [name], [name] 09/27/2018, 10:35 AM

## 2018-09-27 NOTE — Care Management Note (Signed)
Case Management Note  Patient Details  Name: DESIREY KEAHEY MRN: [id number] Date of Birth: 1919/10/21  Subjective/Objective:     Viral UR/bronchitis. Dementia. From home with daughter, 24/7 care. Has RW.  Acutely on oxygen, 99% on 3 liters. Anticipate does not need oxygen.            Action/Plan: DC home in care of family.   Expected Discharge Date:      09/27/18            Expected Discharge Plan:  Home/Self Care  In-House Referral:     Discharge planning Services  CM Consult  Post Acute Care Choice:  NA Choice offered to:  NA  DME Arranged:    DME Agency:     HH Arranged:    HH Agency:     Status of Service:  Completed, signed off  If discussed at [employer] of Stay Meetings, dates discussed:    Additional Comments:  Chinenye Katzenberger, Chauncey Reading, RN 09/27/2018, 11:31 AM

## 2018-09-28 DIAGNOSIS — F0281 Dementia in other diseases classified elsewhere with behavioral disturbance: Secondary | ICD-10-CM | POA: Diagnosis not present

## 2018-09-28 DIAGNOSIS — J209 Acute bronchitis, unspecified: Secondary | ICD-10-CM

## 2018-09-28 DIAGNOSIS — J440 Chronic obstructive pulmonary disease with acute lower respiratory infection: Secondary | ICD-10-CM

## 2018-09-28 DIAGNOSIS — F418 Other specified anxiety disorders: Secondary | ICD-10-CM | POA: Diagnosis not present

## 2018-09-28 DIAGNOSIS — Z7401 Bed confinement status: Secondary | ICD-10-CM | POA: Diagnosis not present

## 2018-09-28 DIAGNOSIS — J9620 Acute and chronic respiratory failure, unspecified whether with hypoxia or hypercapnia: Secondary | ICD-10-CM | POA: Diagnosis not present

## 2018-09-28 DIAGNOSIS — I10 Essential (primary) hypertension: Secondary | ICD-10-CM | POA: Diagnosis not present

## 2018-09-28 DIAGNOSIS — R404 Transient alteration of awareness: Secondary | ICD-10-CM | POA: Diagnosis not present

## 2018-09-28 DIAGNOSIS — I5032 Chronic diastolic (congestive) heart failure: Secondary | ICD-10-CM | POA: Diagnosis not present

## 2018-09-28 LAB — BASIC METABOLIC PANEL
Anion gap: 6 (ref 5–15)
BUN: 31 mg/dL — ABNORMAL HIGH (ref 8–23)
CO2: 24 mmol/L (ref 22–32)
Calcium: 8 mg/dL — ABNORMAL LOW (ref 8.9–10.3)
Chloride: 114 mmol/L — ABNORMAL HIGH (ref 98–111)
Creatinine, Ser: 0.96 mg/dL (ref 0.44–1.00)
GFR calc Af Amer: 58 mL/min — ABNORMAL LOW (ref >60)
GFR calc non Af Amer: 50 mL/min — ABNORMAL LOW (ref >60)
Glucose, Bld: 164 mg/dL — ABNORMAL HIGH (ref 70–99)
Potassium: 4.4 mmol/L (ref 3.5–5.1)
Sodium: 144 mmol/L (ref 135–145)

## 2018-09-28 LAB — MAGNESIUM: Magnesium: 2.4 mg/dL (ref 1.7–2.4)

## 2018-09-28 MED ORDER — GUAIFENESIN ER 600 MG PO TB12: 600.0000 mg | ORAL_TABLET | Freq: Two times a day (BID) | ORAL | 0 refills | Status: DC

## 2018-09-28 MED ORDER — IPRATROPIUM-ALBUTEROL 0.5-2.5 (3) MG/3ML IN SOLN: 3.0000 mL | Freq: Four times a day (QID) | RESPIRATORY_TRACT | 1 refills | Status: AC

## 2018-09-28 MED ORDER — DOXYCYCLINE HYCLATE 100 MG PO TABS: 100.0000 mg | ORAL_TABLET | Freq: Two times a day (BID) | ORAL | 0 refills | Status: DC

## 2018-09-28 MED ORDER — HYDROCORTISONE 10 MG PO TABS: ORAL_TABLET | ORAL | 0 refills | Status: DC

## 2018-09-28 MED ORDER — LORATADINE 10 MG PO TABS: 10.0000 mg | ORAL_TABLET | Freq: Every day | ORAL | 0 refills | Status: DC | PRN

## 2018-09-28 NOTE — Progress Notes (Signed)
Patient appeared to rest well from 2230 after medication to 0530 this am. Some noted parkinson's tremors. Patient becomes agitated with movement and vitals. No more episode of v tach noted this shift.

## 2018-09-28 NOTE — Discharge Summary (Signed)
Physician Discharge Summary  OVETA IDRIS TWS:[id number] DOB: [birth year] DOA: 09/26/2018  PCP: Iona Beard, MD  Admit date: 09/26/2018 Discharge date: 09/28/2018  Time spent: 35 minutes  Recommendations for Outpatient Follow-up:  1. Repeat basic metabolic panel to follow electrolytes and renal function 2. Reassess resolution of his acute bronchitic changes/COPD exacerbation at follow-up visit. 3. Reassess blood pressure and further adjust antihypertensive regimen if needed.   Discharge Diagnoses:  Principal Problem:   Acute on chronic respiratory failure (HCC) Active Problems:   Dementia with behavioral disturbance (HCC)   Parkinson disease (HCC)   Essential hypertension   Depression with anxiety   COPD with acute bronchitis (HCC)   Chronic diastolic CHF (congestive heart failure) ([place])   Discharge Condition: Stable and improved.  Patient discharged home with instructions to follow-up with PCP in 10 days.  Diet recommendation: Heart healthy diet  Filed Weights   09/26/18 1124 09/26/18 1841 09/28/18 0539  Weight: 66.2 kg 67.3 kg 67.6 kg    Brief history of present illness:  85 y.o.femalewith medical history significant forParkinson's disease, Alzheimer's dementia, COPD, hypertension, chronic diastolic CHF who presents to the ED for 2 days of wheezing and chest congestion.  Please refer to H&P written by Dr. [name] on 09/26/2018 for further info/details on admission.  Hospital Course:  Acute on chronic respiratory failure (HCC) -In the setting of COPD exacerbation and bronchitis -Patient with significant improvement in her breathing status.  At discharge no having any further wheezing and in no acute distress; patient was able to maintained good oxygen saturation on 2 L nasal cannula. -Patient will be discharged on hydrocortisone tapering (as per family members had experience allergy reaction to prednisone in the past), continue the use of Mucinex, Claritin, and DuoNeb 4 times a  day.   -Complete doxycycline regimen for bronchitis component -Continue oxygen supplementation; good oxygen saturation on her chronic 2 L supplementation.  Parkinson's disease/dementia with behavioral disturbance ([place]) -Continue supportive care -Continue Exelon patch -Continue Celexa and as needed Xanax.  Essential hypertension: Currently uncontrolled and regionally positive systolic 001/[id number] 916 -Continue Lasix -Continue metoprolol -Blood pressure has stabilized and with good control at discharge.  Chronic diastolic CHF (congestive heart failure) (HCC) -follow daily weight -continue lasix and b-blocker -Advised to watch sodium intake.  Procedures:  See below for x-ray reports.  Consultations:  None  Discharge Exam: Vitals:   09/28/18 0539 09/28/18 0753  BP: 131/61   Pulse: 66   Resp: 20   Temp: 98.4 F (36.9 C)   SpO2:  96%    General: Pleasantly confused and disoriented.  No fever.  Breathing comfortable and with good oxygen saturation on 2 L (chronic supplementation).  Patient in no acute distress. Cardiovascular: S1 and S2, no rubs, no gallops, no murmurs. Respiratory: Improved air movement bilaterally; positive scattered rhonchi.  Patient no using accessory muscles, normal respiratory effort, currently no wheezing or crackles on auscultation. Abdomen: Soft, nontender, nondistended, positive bowel sounds Extremities: Trace edema bilaterally, no cyanosis, no clubbing.  Discharge Instructions   Discharge Instructions    Diet - low sodium heart healthy   Complete by:  As directed    Discharge instructions   Complete by:  As directed    Take medications as prescribed Continue oxygen supplementation Maintain adequate hydration Arrange follow-up with PCP in 10 days Watch amount of sodium in patient's diet and Check daily weights     Allergies as of 09/28/2018      Reactions   Adhesive [tape] Rash  Ensure Pudding [nutritional Supplements] Swelling,  Rash   Throat swelling   Penicillins Swelling, Rash   Has patient had a PCN reaction causing immediate rash, facial/tongue/throat swelling, SOB or lightheadedness with hypotension: Yes Has patient had a PCN reaction causing severe rash involving mucus membranes or skin necrosis: Yes Has patient had a PCN reaction that required hospitalization No Has patient had a PCN reaction occurring within the last 10 years: No If all of the above answers are "NO", then may proceed with Cephalosporin use.      Medication List    STOP taking these medications   albuterol (2.5 MG/3ML) 0.083% nebulizer solution Commonly known as:  PROVENTIL     TAKE these medications   acetaminophen 500 MG tablet Commonly known as:  TYLENOL Take 500 mg by mouth at bedtime. For pain   ALPRAZolam 0.5 MG tablet Commonly known as:  XANAX Take 0.5 mg by mouth 3 (three) times daily.   aspirin EC 81 MG tablet Take 1 tablet (81 mg total) by mouth daily.   benztropine 1 MG tablet Commonly known as:  COGENTIN Take 1 tablet (1 mg total) by mouth daily.   citalopram 20 MG tablet Commonly known as:  CELEXA Take 1 tablet (20 mg total) by mouth every morning.   doxycycline 100 MG tablet Commonly known as:  VIBRA-TABS Take 1 tablet (100 mg total) by mouth every 12 (twelve) hours.   furosemide 20 MG tablet Commonly known as:  LASIX Take 1 tablet (20 mg total) by mouth daily.   guaiFENesin 600 MG 12 hr tablet Commonly known as:  MUCINEX Take 1 tablet (600 mg total) by mouth 2 (two) times daily.   hydrocortisone 10 MG tablet Commonly known as:  CORTEF Take 3 tablets by mouth daily x1 day; then 2 tablet by mouth daily x3 days; then 1 tablet by mouth daily x3 days and stop the use of hydrocortisone.   ipratropium-albuterol 0.5-2.5 (3) MG/3ML Soln Commonly known as:  DUONEB Take 3 mLs by nebulization 4 (four) times daily.   loratadine 10 MG tablet Commonly known as:  CLARITIN Take 1 tablet (10 mg total) by mouth  daily as needed for allergies.   metoprolol tartrate 50 MG tablet Commonly known as:  LOPRESSOR Take 0.5 tablets (25 mg total) by mouth 2 (two) times daily.   rivastigmine 9.5 mg/24hr Commonly known as:  EXELON Place 1 patch (9.5 mg total) onto the skin daily. *Remove and discard used patches*   zolpidem 10 MG tablet Commonly known as:  AMBIEN Take 10 mg by mouth at bedtime as needed for sleep.      Allergies  Allergen Reactions  . Adhesive [Tape] Rash  . Ensure Pudding [Nutritional Supplements] Swelling and Rash    Throat swelling  . Penicillins Swelling and Rash    Has patient had a PCN reaction causing immediate rash, facial/tongue/throat swelling, SOB or lightheadedness with hypotension: Yes Has patient had a PCN reaction causing severe rash involving mucus membranes or skin necrosis: Yes Has patient had a PCN reaction that required hospitalization No Has patient had a PCN reaction occurring within the last 10 years: No If all of the above answers are "NO", then may proceed with Cephalosporin use.   Follow-up Information    Iona Beard, MD. Schedule an appointment as soon as possible for a visit in 10 day(s).   Specialty:  Family Medicine Contact information: [address] [place] [address] [phone]  The results of significant diagnostics from this hospitalization (including imaging, microbiology, ancillary and laboratory) are listed below for reference.    Significant Diagnostic Studies: Dg Chest Portable 1 View  Result Date: 09/26/2018 CLINICAL DATA:  Chest congestion, shortness of Breath EXAM: PORTABLE CHEST 1 VIEW COMPARISON:  04/28/2018 FINDINGS: Cardiomegaly with vascular congestion. Tortuosity of the thoracic aorta. Mild interstitial prominence throughout the lungs likely reflects mild interstitial edema. Low lung volumes with bibasilar atelectasis and suspected small effusions. IMPRESSION: Continued mild edema/CHF. Low lung volumes with  bibasilar atelectasis and small effusions. Electronically Signed   By: Rolm Baptise M.D.   On: 09/26/2018 10:00    Microbiology: Recent Results (from the past 240 hour(s))  Blood Culture (routine x 2)     Status: None (Preliminary result)   Collection Time: 09/26/18  5:04 PM  Result Value Ref Range Status   Specimen Description BLOOD RIGHT HAND  Final   Special Requests   Final    BOTTLES DRAWN AEROBIC AND ANAEROBIC Blood Culture results may not be optimal due to an inadequate volume of blood received in culture bottles   Culture   Final    NO GROWTH 2 DAYS Performed at [facility], [address] [place], [place] [address]    Report Status PENDING  Incomplete  Culture, blood (Routine X 2) w Reflex to ID Panel     Status: None (Preliminary result)   Collection Time: 09/27/18  6:10 AM  Result Value Ref Range Status   Specimen Description RIGHT ANTECUBITAL  Final   Special Requests   Final    BOTTLES DRAWN AEROBIC AND ANAEROBIC Blood Culture results may not be optimal due to an inadequate volume of blood received in culture bottles   Culture   Final    NO GROWTH < 24 HOURS Performed at [facility], [address], [place], [place] [address]    Report Status PENDING  Incomplete     Labs: Basic Metabolic Panel: Recent Labs  Lab 09/26/18 1230 09/27/18 0610 09/28/18 0853  NA 141 141 144  K 3.9 4.4 4.4  CL 108 110 114*  CO2 24 26 24   GLUCOSE 126* 150* 164*  BUN 12 19 31*  CREATININE 0.75 0.87 0.96  CALCIUM 8.3* 8.5* 8.0*  MG  --   --  2.4   Liver Function Tests: Recent Labs  Lab 09/26/18 1230  AST 18  ALT 9  ALKPHOS 89  BILITOT 0.4  PROT 8.1  ALBUMIN 3.4*   CBC: Recent Labs  Lab 09/26/18 1230 09/27/18 0610  WBC 10.8* 10.9*  NEUTROABS 10.2*  --   HGB 9.8* 9.5*  HCT 34.9* 34.5*  MCV 73.2* 74.8*  PLT 231 243   Cardiac Enzymes: Recent Labs  Lab 09/26/18 1230  TROPONINI 0.05*   BNP: BNP (last 3 results) Recent Labs    10/16/17 0914 09/26/18 1230   BNP 230.0* 129.0*    Signed:  Barton Dubois MD.  [facility] 09/28/2018, 11:55 AM

## 2018-09-28 NOTE — Progress Notes (Signed)
Patient daughter called stating "I'm upset and I was there a few minutes ago and my mother was very upset". There was no family in room at shift change or since beginning of shift. Nurse was in patient's room at time of call. Patient has hx dementia and Parkinson's disease. Disoriented x 4. Per daughter she could hear mother in background and she sounded upset. Informed patient that nurse was assessing patient and giving medication at that time. Per daughter "I will be calling all night".

## 2018-10-01 DIAGNOSIS — J449 Chronic obstructive pulmonary disease, unspecified: Secondary | ICD-10-CM | POA: Diagnosis not present

## 2018-10-01 LAB — CULTURE, BLOOD (ROUTINE X 2): Culture: NO GROWTH

## 2018-10-02 LAB — CULTURE, BLOOD (ROUTINE X 2): Culture: NO GROWTH

## 2018-10-08 DIAGNOSIS — J449 Chronic obstructive pulmonary disease, unspecified: Secondary | ICD-10-CM | POA: Diagnosis not present

## 2018-10-14 DIAGNOSIS — J069 Acute upper respiratory infection, unspecified: Secondary | ICD-10-CM | POA: Diagnosis not present

## 2018-11-08 DIAGNOSIS — J449 Chronic obstructive pulmonary disease, unspecified: Secondary | ICD-10-CM | POA: Diagnosis not present

## 2018-12-07 DIAGNOSIS — J449 Chronic obstructive pulmonary disease, unspecified: Secondary | ICD-10-CM | POA: Diagnosis not present

## 2019-01-07 DIAGNOSIS — J449 Chronic obstructive pulmonary disease, unspecified: Secondary | ICD-10-CM | POA: Diagnosis not present

## 2019-02-06 DIAGNOSIS — J449 Chronic obstructive pulmonary disease, unspecified: Secondary | ICD-10-CM | POA: Diagnosis not present

## 2019-02-17 DIAGNOSIS — R21 Rash and other nonspecific skin eruption: Secondary | ICD-10-CM | POA: Diagnosis not present

## 2019-03-09 DIAGNOSIS — J449 Chronic obstructive pulmonary disease, unspecified: Secondary | ICD-10-CM | POA: Diagnosis not present

## 2019-03-13 DIAGNOSIS — M25561 Pain in right knee: Secondary | ICD-10-CM | POA: Diagnosis not present

## 2019-03-18 ENCOUNTER — Inpatient Hospital Stay (HOSPITAL_COMMUNITY): Payer: Medicare Other

## 2019-03-18 ENCOUNTER — Encounter (HOSPITAL_COMMUNITY): Payer: Self-pay | Admitting: Emergency Medicine

## 2019-03-18 ENCOUNTER — Encounter: Payer: Self-pay | Admitting: Orthopaedic Surgery

## 2019-03-18 ENCOUNTER — Emergency Department (HOSPITAL_COMMUNITY): Payer: Medicare Other

## 2019-03-18 ENCOUNTER — Ambulatory Visit (INDEPENDENT_AMBULATORY_CARE_PROVIDER_SITE_OTHER): Payer: Medicare Other

## 2019-03-18 ENCOUNTER — Other Ambulatory Visit: Payer: Self-pay

## 2019-03-18 ENCOUNTER — Inpatient Hospital Stay (HOSPITAL_COMMUNITY)
Admission: EM | Admit: 2019-03-18 | Discharge: 2019-03-20 | DRG: 534 | Disposition: A | Discharge status: Home Health | Payer: Medicare Other | Attending: Internal Medicine | Admitting: Internal Medicine

## 2019-03-18 ENCOUNTER — Ambulatory Visit (INDEPENDENT_AMBULATORY_CARE_PROVIDER_SITE_OTHER): Payer: Medicare Other | Admitting: Orthopaedic Surgery

## 2019-03-18 VITALS — BP 134/84 | HR 98 | Temp 98.6°F | Resp 22

## 2019-03-18 DIAGNOSIS — Z9049 Acquired absence of other specified parts of digestive tract: Secondary | ICD-10-CM

## 2019-03-18 DIAGNOSIS — E860 Dehydration: Secondary | ICD-10-CM | POA: Diagnosis present

## 2019-03-18 DIAGNOSIS — G20 Parkinson's disease: Secondary | ICD-10-CM | POA: Diagnosis present

## 2019-03-18 DIAGNOSIS — G309 Alzheimer's disease, unspecified: Secondary | ICD-10-CM | POA: Diagnosis not present

## 2019-03-18 DIAGNOSIS — Y92002 Bathroom of unspecified non-institutional (private) residence single-family (private) house as the place of occurrence of the external cause: Secondary | ICD-10-CM | POA: Diagnosis not present

## 2019-03-18 DIAGNOSIS — I110 Hypertensive heart disease with heart failure: Secondary | ICD-10-CM | POA: Diagnosis present

## 2019-03-18 DIAGNOSIS — M79604 Pain in right leg: Secondary | ICD-10-CM | POA: Diagnosis not present

## 2019-03-18 DIAGNOSIS — I700 Atherosclerosis of aorta: Secondary | ICD-10-CM | POA: Diagnosis not present

## 2019-03-18 DIAGNOSIS — W19XXXA Unspecified fall, initial encounter: Secondary | ICD-10-CM | POA: Diagnosis not present

## 2019-03-18 DIAGNOSIS — Z7982 Long term (current) use of aspirin: Secondary | ICD-10-CM

## 2019-03-18 DIAGNOSIS — M2550 Pain in unspecified joint: Secondary | ICD-10-CM | POA: Diagnosis not present

## 2019-03-18 DIAGNOSIS — J449 Chronic obstructive pulmonary disease, unspecified: Secondary | ICD-10-CM | POA: Diagnosis not present

## 2019-03-18 DIAGNOSIS — F0281 Dementia in other diseases classified elsewhere with behavioral disturbance: Secondary | ICD-10-CM | POA: Diagnosis present

## 2019-03-18 DIAGNOSIS — S72491A Other fracture of lower end of right femur, initial encounter for closed fracture: Secondary | ICD-10-CM | POA: Diagnosis not present

## 2019-03-18 DIAGNOSIS — Z79899 Other long term (current) drug therapy: Secondary | ICD-10-CM | POA: Diagnosis not present

## 2019-03-18 DIAGNOSIS — S72401A Unspecified fracture of lower end of right femur, initial encounter for closed fracture: Secondary | ICD-10-CM | POA: Diagnosis present

## 2019-03-18 DIAGNOSIS — D509 Iron deficiency anemia, unspecified: Secondary | ICD-10-CM | POA: Diagnosis not present

## 2019-03-18 DIAGNOSIS — Z880 Allergy status to penicillin: Secondary | ICD-10-CM | POA: Diagnosis not present

## 2019-03-18 DIAGNOSIS — M79606 Pain in leg, unspecified: Secondary | ICD-10-CM

## 2019-03-18 DIAGNOSIS — I252 Old myocardial infarction: Secondary | ICD-10-CM | POA: Diagnosis not present

## 2019-03-18 DIAGNOSIS — Z91048 Other nonmedicinal substance allergy status: Secondary | ICD-10-CM | POA: Diagnosis not present

## 2019-03-18 DIAGNOSIS — W1811XA Fall from or off toilet without subsequent striking against object, initial encounter: Secondary | ICD-10-CM | POA: Diagnosis present

## 2019-03-18 DIAGNOSIS — F0391 Unspecified dementia with behavioral disturbance: Secondary | ICD-10-CM | POA: Diagnosis present

## 2019-03-18 DIAGNOSIS — Z1159 Encounter for screening for other viral diseases: Secondary | ICD-10-CM | POA: Diagnosis not present

## 2019-03-18 DIAGNOSIS — F418 Other specified anxiety disorders: Secondary | ICD-10-CM | POA: Diagnosis present

## 2019-03-18 DIAGNOSIS — S7292XA Unspecified fracture of left femur, initial encounter for closed fracture: Secondary | ICD-10-CM

## 2019-03-18 DIAGNOSIS — Z90710 Acquired absence of both cervix and uterus: Secondary | ICD-10-CM | POA: Diagnosis not present

## 2019-03-18 DIAGNOSIS — I10 Essential (primary) hypertension: Secondary | ICD-10-CM | POA: Diagnosis present

## 2019-03-18 DIAGNOSIS — E870 Hyperosmolality and hypernatremia: Secondary | ICD-10-CM | POA: Diagnosis present

## 2019-03-18 DIAGNOSIS — S72351A Displaced comminuted fracture of shaft of right femur, initial encounter for closed fracture: Secondary | ICD-10-CM | POA: Diagnosis not present

## 2019-03-18 DIAGNOSIS — Z9010 Acquired absence of unspecified breast and nipple: Secondary | ICD-10-CM

## 2019-03-18 DIAGNOSIS — Z03818 Encounter for observation for suspected exposure to other biological agents ruled out: Secondary | ICD-10-CM | POA: Diagnosis not present

## 2019-03-18 DIAGNOSIS — S79929A Unspecified injury of unspecified thigh, initial encounter: Secondary | ICD-10-CM | POA: Diagnosis not present

## 2019-03-18 DIAGNOSIS — S72451A Displaced supracondylar fracture without intracondylar extension of lower end of right femur, initial encounter for closed fracture: Secondary | ICD-10-CM | POA: Diagnosis not present

## 2019-03-18 DIAGNOSIS — F03918 Unspecified dementia, unspecified severity, with other behavioral disturbance: Secondary | ICD-10-CM | POA: Diagnosis present

## 2019-03-18 DIAGNOSIS — G20A1 Parkinson's disease without dyskinesia, without mention of fluctuations: Secondary | ICD-10-CM | POA: Diagnosis present

## 2019-03-18 DIAGNOSIS — I517 Cardiomegaly: Secondary | ICD-10-CM | POA: Diagnosis not present

## 2019-03-18 DIAGNOSIS — I5032 Chronic diastolic (congestive) heart failure: Secondary | ICD-10-CM | POA: Diagnosis not present

## 2019-03-18 DIAGNOSIS — Z66 Do not resuscitate: Secondary | ICD-10-CM | POA: Diagnosis present

## 2019-03-18 DIAGNOSIS — Z91018 Allergy to other foods: Secondary | ICD-10-CM

## 2019-03-18 DIAGNOSIS — S72002A Fracture of unspecified part of neck of left femur, initial encounter for closed fracture: Secondary | ICD-10-CM | POA: Diagnosis not present

## 2019-03-18 DIAGNOSIS — Z7401 Bed confinement status: Secondary | ICD-10-CM | POA: Diagnosis not present

## 2019-03-18 LAB — CBC WITH DIFFERENTIAL/PLATELET
Abs Immature Granulocytes: 0.08 10*3/uL — ABNORMAL HIGH (ref 0.00–0.07)
Basophils Absolute: 0 10*3/uL (ref 0.0–0.1)
Basophils Relative: 0 %
Eosinophils Absolute: 0.1 10*3/uL (ref 0.0–0.5)
Eosinophils Relative: 1 %
HCT: 26.4 % — ABNORMAL LOW (ref 36.0–46.0)
Hemoglobin: 7.4 g/dL — ABNORMAL LOW (ref 12.0–15.0)
Immature Granulocytes: 1 %
Lymphocytes Relative: 9 %
Lymphs Abs: 1.1 10*3/uL (ref 0.7–4.0)
MCH: 21.6 pg — ABNORMAL LOW (ref 26.0–34.0)
MCHC: 28 g/dL — ABNORMAL LOW (ref 30.0–36.0)
MCV: 77.2 fL — ABNORMAL LOW (ref 80.0–100.0)
Monocytes Absolute: 0.9 10*3/uL (ref 0.1–1.0)
Monocytes Relative: 7 %
Neutro Abs: 10.1 10*3/uL — ABNORMAL HIGH (ref 1.7–7.7)
Neutrophils Relative %: 82 %
Platelets: 442 10*3/uL — ABNORMAL HIGH (ref 150–400)
RBC: 3.42 MIL/uL — ABNORMAL LOW (ref 3.87–5.11)
RDW: 19.3 % — ABNORMAL HIGH (ref 11.5–15.5)
WBC: 12.2 10*3/uL — ABNORMAL HIGH (ref 4.0–10.5)
nRBC: 0 % (ref 0.0–0.2)

## 2019-03-18 LAB — BASIC METABOLIC PANEL
Anion gap: 8 (ref 5–15)
BUN: 21 mg/dL (ref 8–23)
CO2: 24 mmol/L (ref 22–32)
Calcium: 8.3 mg/dL — ABNORMAL LOW (ref 8.9–10.3)
Chloride: 115 mmol/L — ABNORMAL HIGH (ref 98–111)
Creatinine, Ser: 0.86 mg/dL (ref 0.44–1.00)
GFR calc Af Amer: >60 mL/min (ref >60)
GFR calc non Af Amer: 57 mL/min — ABNORMAL LOW (ref >60)
Glucose, Bld: 126 mg/dL — ABNORMAL HIGH (ref 70–99)
Potassium: 3.9 mmol/L (ref 3.5–5.1)
Sodium: 147 mmol/L — ABNORMAL HIGH (ref 135–145)

## 2019-03-18 LAB — TYPE AND SCREEN
ABO/RH(D): A POS
Antibody Screen: NEGATIVE

## 2019-03-18 LAB — SARS CORONAVIRUS 2 BY RT PCR (HOSPITAL ORDER, PERFORMED IN ~~LOC~~ HOSPITAL LAB): SARS Coronavirus 2: NEGATIVE

## 2019-03-18 LAB — PROTIME-INR
INR: 1.1 (ref 0.8–1.2)
Prothrombin Time: 14.2 seconds (ref 11.4–15.2)

## 2019-03-18 MED ORDER — SODIUM CHLORIDE 0.9% FLUSH
10.0000 mL | Freq: Two times a day (BID) | INTRAVENOUS | Status: DC
  Administered 2019-03-18 – 2019-03-20 (×2): 10 mL

## 2019-03-18 MED ORDER — SENNOSIDES-DOCUSATE SODIUM 8.6-50 MG PO TABS
1.0000 | ORAL_TABLET | Freq: Every evening | ORAL | Status: DC | PRN
  Filled 2019-03-18: qty 1

## 2019-03-18 MED ORDER — BENZTROPINE MESYLATE 1 MG PO TABS
1.0000 mg | ORAL_TABLET | Freq: Every day | ORAL | Status: DC
  Administered 2019-03-19 – 2019-03-20 (×2): 1 mg via ORAL
  Filled 2019-03-18 (×3): qty 1

## 2019-03-18 MED ORDER — DEXTROSE-NACL 5-0.45 % IV SOLN
INTRAVENOUS | Status: AC
  Administered 2019-03-18: 19:00:00 via INTRAVENOUS

## 2019-03-18 MED ORDER — IPRATROPIUM-ALBUTEROL 0.5-2.5 (3) MG/3ML IN SOLN: 3.0000 mL | Freq: Four times a day (QID) | RESPIRATORY_TRACT | Status: DC | PRN

## 2019-03-18 MED ORDER — RIVASTIGMINE 9.5 MG/24HR TD PT24
9.5000 mg | MEDICATED_PATCH | Freq: Every day | TRANSDERMAL | Status: DC
  Administered 2019-03-18 – 2019-03-20 (×3): 9.5 mg via TRANSDERMAL
  Filled 2019-03-18 (×4): qty 1

## 2019-03-18 MED ORDER — CITALOPRAM HYDROBROMIDE 20 MG PO TABS
20.0000 mg | ORAL_TABLET | Freq: Every morning | ORAL | Status: DC
  Administered 2019-03-19 – 2019-03-20 (×2): 20 mg via ORAL
  Filled 2019-03-18 (×4): qty 1

## 2019-03-18 MED ORDER — MORPHINE SULFATE (PF) 2 MG/ML IV SOLN
0.5000 mg | INTRAVENOUS | Status: DC | PRN
  Administered 2019-03-19: 0.5 mg via INTRAVENOUS
  Filled 2019-03-18: qty 1

## 2019-03-18 MED ORDER — METOPROLOL TARTRATE 25 MG PO TABS
25.0000 mg | ORAL_TABLET | Freq: Two times a day (BID) | ORAL | Status: DC
  Administered 2019-03-18 – 2019-03-20 (×4): 25 mg via ORAL
  Filled 2019-03-18 (×5): qty 1

## 2019-03-18 MED ORDER — ALPRAZOLAM 0.5 MG PO TABS
0.5000 mg | ORAL_TABLET | Freq: Three times a day (TID) | ORAL | Status: DC | PRN
  Administered 2019-03-19: 0.5 mg via ORAL
  Filled 2019-03-18 (×2): qty 1

## 2019-03-18 MED ORDER — ACETAMINOPHEN 325 MG PO TABS
650.0000 mg | ORAL_TABLET | Freq: Four times a day (QID) | ORAL | Status: DC | PRN
  Administered 2019-03-18: 650 mg via ORAL
  Filled 2019-03-18: qty 2

## 2019-03-18 MED ORDER — OXYCODONE HCL 5 MG PO TABS
5.0000 mg | ORAL_TABLET | ORAL | Status: DC | PRN
  Administered 2019-03-19: 5 mg via ORAL
  Filled 2019-03-18: qty 1

## 2019-03-18 MED ORDER — ACETAMINOPHEN 650 MG RE SUPP: 650.0000 mg | Freq: Four times a day (QID) | RECTAL | Status: DC | PRN

## 2019-03-18 MED ORDER — SODIUM CHLORIDE 0.9% FLUSH: 10.0000 mL | INTRAVENOUS | Status: DC | PRN

## 2019-03-18 NOTE — ED Notes (Signed)
[facility] here to transport pt.   

## 2019-03-18 NOTE — H&P (Signed)
History and Physical    [name] LNL:[id number] DOB: October 08, 1919 DOA: 03/18/2019  PCP: Iona Beard, MD  Patient coming from: Orthopedic office  I have personally briefly reviewed patient's old medical records in [place]  Chief Complaint: Right femur fracture  HPI: [name] is a 85 y.o. female with medical history significant for Parkinson's disease with advanced Alzheimer's dementia, COPD, hypertension, chronic diastolic CHF, anemia, and anxiety who presents to the ED from orthopedics office for evaluation of right femur fracture.  Patient is unable to provide any history due to advanced dementia therefore entirety of history is obtained from EDP, chart review, and daughter by phone.  Per daughter, patient at baseline has advanced dementia and is requiring full assist with all ADLs.  She has been able to stand with assistance otherwise has not ambulatory and requires the use of a wheelchair.  She reportedly fell while getting off the toilet on 03/09/2019.  Per documentation, she had mobile x-rays performed 03/13/2019 ordered by  [facility] and was given an appointment at orthopedic office for today 03/18/2019.  She was seen by Dr. Luna Glasgow orthopedics.  New x-rays were done which showed a displaced distal femur fracture.  Patient was placed in a knee immobilizer and EMS were called to transfer the patient to the ED for further evaluation.  ED Course:  Initial vitals showed BP 134/84, pulse 98, RR 22, temp 98.6 Fahrenheit, SPO2 100% on room air.  Labs are notable for WBC 12.2, hemoglobin 7.4 (baseline between 9-10), platelets 442,000, sodium 147, potassium 3.9, bicarb 24, BUN 21, creatinine 0.86, serum glucose 126.   Right knee x-ray showed an acute displaced distal femur fracture at distal diaphysis and metaphysis of the right knee.    X-ray of the right femur showed a severely comminuted and displaced fracture involving the distal right femur.    X-ray of the pelvis showed  diffuse osteopenia, and degenerative changes of the lower spine and both hips, surgical screws in the right hip with intact hardware, stable deformity of the left pubis consistent with old healed fractures.  Portable chest x-ray was negative for acute focal consolidation, effusion, or edema.  EDP discussed the case with on-call orthopedist, Dr. Stann Mainland, who recommended evaluation at [facility] for further recommendations on how best to proceed and surgery might not be a viable option.  The hospitalist service was consulted to admit.   Review of Systems:  Unable to obtain due to advanced dementia.   Past Medical History:  Diagnosis Date  . Alzheimer's dementia ([place])   . Cancer (HCC)    breast  . CHF (congestive heart failure) ([place])   . Heart attack ([place])   . Hypertension   . Parkinson disease [facility])     Past Surgical History:  Procedure Laterality Date  . ABDOMINAL HYSTERECTOMY    . arm surgery    . BREAST SURGERY    . CHOLECYSTECTOMY    . COLON SURGERY    . HIP PINNING,CANNULATED Right 02/01/2013   Procedure: CANNULATED HIP PINNING;  Surgeon: Sanjuana Kava, MD;  Location: [facility];  Service: Orthopedics;  Laterality: Right;  . LEG SURGERY    . MASTECTOMY    . PELVIC FRACTURE SURGERY    . small intestine removed    . TONSILLECTOMY      Social History:  reports that she has never smoked. She has never used smokeless tobacco. She reports that she does not drink alcohol or use drugs.  Allergies  Allergen  Reactions  . Adhesive [Tape] Rash  . Ensure Pudding [Nutritional Supplements] Swelling and Rash    Throat swelling  . Penicillins Swelling and Rash    Has patient had a PCN reaction causing immediate rash, facial/tongue/throat swelling, SOB or lightheadedness with hypotension: Yes Has patient had a PCN reaction causing severe rash involving mucus membranes or skin necrosis: Yes Has patient had a PCN reaction that required hospitalization No Has patient had a PCN reaction  occurring within the last 10 years: No If all of the above answers are "NO", then may proceed with Cephalosporin use.    Family History  Problem Relation Age of Onset  . Aneurysm Mother   . Diabetes Father      Prior to Admission medications   Medication Sig Start Date End Date Taking? Authorizing Provider  acetaminophen (TYLENOL) 500 MG tablet Take 500 mg by mouth at bedtime. For pain   Yes [provider]  ALPRAZolam (XANAX) 0.5 MG tablet Take 0.5 mg by mouth 3 (three) times daily.    Yes [provider]  aspirin EC 81 MG tablet Take 1 tablet (81 mg total) by mouth daily. 04/05/13  Yes Kathie Dike, MD  benztropine (COGENTIN) 1 MG tablet Take 1 tablet (1 mg total) by mouth daily. 04/05/13  Yes Kathie Dike, MD  citalopram (CELEXA) 20 MG tablet Take 1 tablet (20 mg total) by mouth every morning. 04/05/13  Yes Kathie Dike, MD  furosemide (LASIX) 20 MG tablet Take 1 tablet (20 mg total) by mouth daily. 04/30/18 04/30/19 Yes Erline Hau, MD  ipratropium-albuterol (DUONEB) 0.5-2.5 (3) MG/3ML SOLN Take 3 mLs by nebulization 4 (four) times daily. 09/28/18  Yes Barton Dubois, MD  loratadine (CLARITIN) 10 MG tablet Take 1 tablet (10 mg total) by mouth daily as needed for allergies. 09/28/18 09/28/19 Yes Barton Dubois, MD  metoprolol (LOPRESSOR) 50 MG tablet Take 0.5 tablets (25 mg total) by mouth 2 (two) times daily. 09/24/13  Yes Kathie Dike, MD  rivastigmine (EXELON) 9.5 mg/24hr Place 1 patch (9.5 mg total) onto the skin daily. *Remove and discard used patches* 04/05/13  Yes Memon, Jolaine Artist, MD  zolpidem (AMBIEN) 10 MG tablet Take 10 mg by mouth at bedtime as needed for sleep.   Yes [provider]    Physical Exam: Vitals:   03/18/19 1230  BP: 131/69  Pulse: 61  Resp: 19  Temp: 98.6 F (37 C)  SpO2: 100%   Limited due to advanced dementia. Constitutional: Resting supine in bed, calm, not following commands or communicating but awake  Eyes: PERRL ENMT: Mucous membranes are dry. Neck: normal, supple, no masses. Respiratory: clear to auscultation anteriorly. Normal respiratory effort. No accessory muscle use.  Cardiovascular: Regular rate and rhythm, no murmurs / rubs / gallops. Abdomen: no tenderness, no masses palpated. Bowel sounds positive.  Musculoskeletal: Right foot everted, knee immobilizer in place.  She spontaneously wiggling toes on both feet. Skin: no rashes, lesions, ulcers. No induration Neurologic: Limited as patient not following commands, wiggling toes on both feet spontaneously, will track with eyes Psychiatric: Awake, not communicative or following commands due to advanced dementia    Labs on Admission: I have personally reviewed following labs and imaging studies  CBC: Recent Labs  Lab 03/18/19 1405  WBC 12.2*  NEUTROABS 10.1*  HGB 7.4*  HCT 26.4*  MCV 77.2*  PLT 924*   Basic Metabolic Panel: Recent Labs  Lab 03/18/19 1405  NA 147*  K 3.9  CL 115*  CO2 24  GLUCOSE 126*  BUN 21  CREATININE 0.86  CALCIUM 8.3*   GFR: CrCl cannot be calculated (Unknown ideal weight.). Liver Function Tests: No results for input(s): AST, ALT, ALKPHOS, BILITOT, PROT, ALBUMIN in the last 168 hours. No results for input(s): LIPASE, AMYLASE in the last 168 hours. No results for input(s): AMMONIA in the last 168 hours. Coagulation Profile: Recent Labs  Lab 03/18/19 1405  INR 1.1   Cardiac Enzymes: No results for input(s): CKTOTAL, CKMB, CKMBINDEX, TROPONINI in the last 168 hours. BNP (last 3 results) No results for input(s): PROBNP in the last 8760 hours. HbA1C: No results for input(s): HGBA1C in the last 72 hours. CBG: No results for input(s): GLUCAP in the last 168 hours. Lipid Profile: No results for input(s): CHOL, HDL, LDLCALC, TRIG, CHOLHDL, LDLDIRECT in the last 72 hours. Thyroid Function Tests: No results for input(s): TSH, T4TOTAL, FREET4, T3FREE, THYROIDAB in the last 72 hours.  Anemia Panel: No results for input(s): VITAMINB12, FOLATE, FERRITIN, TIBC, IRON, RETICCTPCT in the last 72 hours. Urine analysis:    Component Value Date/Time   COLORURINE YELLOW 09/26/2018 0939   APPEARANCEUR CLEAR 09/26/2018 0939   LABSPEC 1.015 09/26/2018 0939   PHURINE 7.0 09/26/2018 0939   GLUCOSEU NEGATIVE 09/26/2018 0939   HGBUR MODERATE (A) 09/26/2018 0939   BILIRUBINUR NEGATIVE 09/26/2018 0939   KETONESUR NEGATIVE 09/26/2018 0939   PROTEINUR NEGATIVE 09/26/2018 0939   UROBILINOGEN 0.2 01/06/2015 1842   NITRITE NEGATIVE 09/26/2018 0939   LEUKOCYTESUR NEGATIVE 09/26/2018 0939    Radiological Exams on Admission: Dg Chest 1 View  Result Date: 03/18/2019 CLINICAL DATA:  Femur fracture. EXAM: CHEST  1 VIEW COMPARISON:  Radiograph September 26, 2018. FINDINGS: Stable cardiomegaly. Both lungs are clear. The visualized skeletal structures are unremarkable. IMPRESSION: No active disease. Aortic Atherosclerosis (ICD10-I70.0). Electronically Signed   By: Marijo Conception M.D.   On: 03/18/2019 13:47   Dg Pelvis 1-2 Views  Result Date: 03/18/2019 CLINICAL DATA:  Right leg pain. EXAM: PELVIS - 1-2 VIEW COMPARISON:  MRI 01/31/2013. FINDINGS: Diffuse osteopenia. Degenerative changes lumbar spine and both hips. Surgical screws right hip. Hardware intact. Anatomic alignment. Stable deformity noted of the left pubis consistent with old healed fractures. No acute bony abnormality identified. Surgical clips noted over the abdomen. Peripheral vascular calcification. IMPRESSION: 1. Diffuse osteopenia. Degenerative changes lumbar spine and both hips. Surgical screws right hip. Hardware intact. Anatomic alignment. Stable deformity noted the left pubis consistent old healed fractures. No acute bony abnormality identified. 2.  Peripheral vascular disease. Electronically Signed   By: Marcello Moores  Register   On: 03/18/2019 13:46   Dg Knee 1-2 Views Right  Result Date: 03/18/2019 Clinical:  History of fall and  marked knee pain right X-rays were done of the right knee, two views. There is a displaced anteriorly and medially distal femur fracture at border of diaphysis and metaphysis with comminution.  Soft tissue swelling is present.  There is a screw and wire in the proximal tibia from the lateral side. Significant degenerative changes are present in the knee, more laterally.  The fracture does not appear to enter the condyles or the condylar notch on these two views.  She is in pain and positioning was difficult.  Bone quality is osteopenic. Impression:  Acute displaced distal femur fracture at distal diaphysis and metaphysis of the right knee, osteopenia. Electronically Signed Sanjuana Kava, MD 6/30/202011:22 AM   Dg Femur Min 2 Views Right  Result Date: 03/18/2019 CLINICAL DATA:  Right leg pain. EXAM: RIGHT  FEMUR 2 VIEWS COMPARISON:  None. FINDINGS: Severely comminuted and displaced fracture is seen involving the distal right femur. Status post surgical internal fixation of old proximal right femoral fracture is noted. Vascular calcifications are noted. IMPRESSION: Severely comminuted and displaced fracture is seen involving the distal right femur. Electronically Signed   By: Marijo Conception M.D.   On: 03/18/2019 13:46    EKG: Independently reviewed.  Poor quality, questionable sinus arrhythmia with motion artifact.  Assessment/Plan Principal Problem:   Closed fracture of distal end of right femur, initial encounter ([place]) Active Problems:   Dementia with behavioral disturbance (HCC)   Parkinson disease ([place])   Essential hypertension   Depression with anxiety   Chronic diastolic CHF (congestive heart failure) (HCC)  [name] is a 85 y.o. female with medical history significant for Parkinson's disease with advanced Alzheimer's dementia, COPD, hypertension, chronic diastolic CHF, anemia, and anxiety who is admitted with a distal right femur fracture.   Distal right femur fracture: Reportedly  occurring after a fall on 03/09/2019.  Knee immobilizer placed in orthopedic clinic.  She has poor baseline status requiring assist with all ADLs.  Daughter has good insight into patient's condition and reports continued desire to care for patient at home on discharge as she has home supervision 24/7, home aide, hospital bed, and [place] lift.  Daughter understands patient unlikely to be a surgical candidate, but is agreeable to transfer to [facility] for further orthopedic recommendations. -Transfer to [place] in place -Continue pain control as needed with hold parameters -Orthopedics consulted  Mild hypernatremia: Likely from dehydration.  Will give gentle D5-1/2 NS @ 75/hr for 10 hours.  Hold home Lasix and repeat labs in a.m.  Microcytic anemia: Hemoglobin 7.4 on admission (baseline between 9-10).  No obvious bleeding noted.  Will hold home aspirin and continue to monitor.  Chronic diastolic CHF: EF 82-42% with grade 2 diastolic dysfunction by echocardiogram 01/07/2015.  As above, she appears volume depleted. -Gentle fluids as above -Hold home Lasix -Monitor daily weights, I/O's  Hypertension: -Continue home Lopressor  COPD: Currently stable without respiratory distress.  Continue as needed nebulizer.  Depression/anxiety: -Continue home Celexa and Xanax 0.5 mg TID PRN with hold parameters  Advanced Parkinson's/Alzheimer's dementia: Requiring full assist for all ADLs at home.  Patient is noncommunicative.  Daughter wishes to continue to care for patient at home on discharge.  I discussed possibility of palliative care assistance, however she has deferred at this time. -Continue Cogentin and realistic mean patch  DVT prophylaxis: SCDs Code Status: DNR/DNI, confirmed with daughter Family Communication: Discussed with daughter [name] by phone [phone] Disposition Plan: Pending clinical progress Consults called: Orthopedics Admission status:  Inpatient   Zada Finders MD [facility]  If 7PM-7AM, please contact night-coverage [email or web address]  03/18/2019, 4:19 PM

## 2019-03-18 NOTE — Progress Notes (Signed)
Will discuss case with patient's HCPA and may be able to plan for fixation tomorrow.  Keep NPO at [facility] for now and possible surgery with Dr. Doreatha Martin of [facility] tomorrow.

## 2019-03-18 NOTE — Plan of Care (Signed)
  Problem: Safety: Goal: Ability to remain free from injury will improve Outcome: Progressing   Problem: Skin Integrity: Goal: Risk for impaired skin integrity will decrease Outcome: Progressing   

## 2019-03-18 NOTE — ED Triage Notes (Signed)
Pt sent over from doctors office for right leg pain.  Pt disoriented x 4.  Right leg rotated and shortened.  Pedal pulses present.    RN tried to called contact for more information about complaint.  Unable to answer

## 2019-03-18 NOTE — Progress Notes (Signed)
Subjective:    Patient ID: [name], female    DOB: 14-Oct-1919, 85 y.o.   MRN: [id number]  HPI She has Alzheimers disease and lives at home. She is cared by her daughter who is present.  Ten days ago on March 09, 2019 while she was going to the bathroom she fell and hurt her right knee.  Her daughter was helping her.  The patient was then unable to stand. She was in pain.  Her daughter got her back to bed and used ice and elevation on the knee.  The knee and distal femur had some swelling.  She did not yell or complain of much pain.  Her daughter was very concerned and called Dr. Criss Rosales who ordered mobile X-rays which were done 03-13-2019.  An appointment was made to this office.  We were told there was a fracture of the knee area.  She arrived today by private car.  It took several people to get her out of the vehicle.  I then read all the reports which had accumulated by then.  Mobile x-rays reported a moderately displaced comminuted fracture of the distal diaphysis of the femur, consistent with an acute fracture.  I was not aware of the report until the patient was in the examination room.  I have copies of Dr.Hill's notes [facility]) from 03-13-2019.  I did not have copies of the x-rays.  New x-rays were done showing the displaced distal femur fracture.  She is in pain.  She was placed in a knee immobilizer.  I talked to her daughter who is present and explained the injury and the findings.  I am amazed the patient has done this well over the last ten days.  I do not do surgery anymore.  I have had the staff communicate with Dr. Aline Brochure who asked the patient be sent to the ER.  The patient has complete disorientation.  Her daughter says her mother has history of heart disease and hypertension.  I have called the EMS and they have arrived and will take the patient from the office directly to the ER.  The daughter and I discussed possible treatment plans depending on what is  determined as to her mother's ability to have any surgery.     Review of Systems  Constitutional: Positive for activity change.  Respiratory: Positive for shortness of breath.   Musculoskeletal: Positive for joint swelling.  Psychiatric/Behavioral: Positive for confusion.       Complete disorientation as to time, place, person  All other systems reviewed and are negative.      Objective:   Physical Exam Vitals signs reviewed.  Constitutional:      Appearance: She is well-developed.     Comments: Complete disorientation.  HENT:     Head: Normocephalic and atraumatic.  Eyes:     Conjunctiva/sclera: Conjunctivae normal.     Pupils: Pupils are equal, round, and reactive to light.  Neck:     Musculoskeletal: Normal range of motion and neck supple.  Cardiovascular:     Rate and Rhythm: Normal rate and regular rhythm.  Pulmonary:     Effort: Pulmonary effort is normal.  Abdominal:     Palpations: Abdomen is soft.  Musculoskeletal:     Right knee: She exhibits decreased range of motion, swelling and deformity.       Legs:  Skin:    General: Skin is warm and dry.  Neurological:     Mental Status: She is oriented to  person, place, and time.     Cranial Nerves: No cranial nerve deficit.     Motor: No abnormal muscle tone.     Coordination: Coordination normal.     Deep Tendon Reflexes: Reflexes are normal and symmetric. Reflexes normal.  Psychiatric:        Behavior: Behavior normal.        Thought Content: Thought content normal.        Judgment: Judgment normal.      X-rays were done of the distal femur and knee, reported separately.  She has displaced fracture     Assessment & Plan:   Encounter Diagnosis  Name Primary?  . Closed fracture of distal end of right femur, unspecified fracture morphology, initial encounter ([place]) Yes   She has been sent to the ER by ambulance.  As stated, I have gone over the findings with the patient's daughter and explained the  significance of the problem.  I spent about an hour here in the office working with the patient and her daughter and getting the ambulance.  Electronically Signed Sanjuana Kava, MD 6/30/202012:06 PM

## 2019-03-18 NOTE — ED Provider Notes (Signed)
Medical screening examination/treatment/procedure(s) were conducted as a shared visit with non-physician practitioner(s) and myself.  I personally evaluated the patient during the encounter. Pt sent by Ortho MD re: femur fx. Will need admit.   EKG Interpretation  Date/Time:  Tuesday March 18 2019 13:45:52 EDT Ventricular Rate:  63 PR Interval:    QRS Duration: 107 QT Interval:  475 QTC Calculation: 487 R Axis:   59 Text Interpretation:  Poor data quality Normal sinus rhythm Ventricular premature complex Low voltage, precordial leads Anteroseptal infarct, old artifact due to pt shaking When compared with ECG of 09/26/2018 Rate slower Confirmed by Francine Graven [phone]) on 03/18/2019 2:11:15 PM     Francine Graven, DO 03/18/19 1513

## 2019-03-18 NOTE — ED Provider Notes (Signed)
[facility] EMERGENCY DEPARTMENT Provider Note   CSN: [id number] Arrival date & time: 03/18/19  1211     History   Chief Complaint Chief Complaint  Patient presents with   Leg Pain    HPI [name] is a 85 y.o. female.     Patient with history of dementia, CHF, hypertension --presents to the emergency department today.  Patient reportedly fell while getting off of the toilet on 03/09/2019.  Patient was seen by [facility] clinic and had an x-ray showing a moderately displaced comminuted fracture of the distal diaphysis of the femur.  Patient was reportedly then referred back to her primary care doctor who saw her today.  They repeated x-rays and sent the patient to the emergency department for definitive management.  Patient is not oriented at baseline.  Level 5 caveat due to dementia.     Past Medical History:  Diagnosis Date   Alzheimer's dementia ([place])    Cancer ([place])    breast   CHF (congestive heart failure) ([place])    Heart attack ([place])    Hypertension    Parkinson disease ([place])     Patient Active Problem List   Diagnosis Date Noted   Acute on chronic respiratory failure ([place]) 09/26/2018   Acute on chronic diastolic CHF (congestive heart failure) ([place]) 04/28/2018   Dysarthria 03/55/9741   Acute metabolic encephalopathy 63/84/5364   Chronic diastolic CHF (congestive heart failure) ([employer]) 10/17/2017   Fever    COPD with acute bronchitis ([place]) 10/16/2017   Dyspnea 01/06/2015   Essential hypertension 01/06/2015   Depression with anxiety 01/06/2015   Insomnia 01/06/2015   Acute on chronic diastolic congestive heart failure ([place])    Lesion of liver 09/17/2013   Microcytic anemia 04/05/2013   Elevated troponin 04/04/2013   Fall 01/31/2013   Dementia with behavioral disturbance ([place]) 01/31/2013   Parkinson disease ([place]) 01/31/2013   Community acquired pneumonia 06/30/2012   Weakness generalized 06/30/2012    Past Surgical History:   Procedure Laterality Date   ABDOMINAL HYSTERECTOMY     arm surgery     BREAST SURGERY     CHOLECYSTECTOMY     COLON SURGERY     HIP PINNING,CANNULATED Right 02/01/2013   Procedure: CANNULATED HIP PINNING;  Surgeon: Sanjuana Kava, MD;  Location: [facility];  Service: Orthopedics;  Laterality: Right;   LEG SURGERY     MASTECTOMY     PELVIC FRACTURE SURGERY     small intestine removed     TONSILLECTOMY       OB History    Gravida  4   Para  4   Term  4   Preterm      AB      Living        SAB      TAB      Ectopic      Multiple      Live Births               Home Medications    Prior to Admission medications   Medication Sig Start Date End Date Taking? Authorizing Provider  acetaminophen (TYLENOL) 500 MG tablet Take 500 mg by mouth at bedtime. For pain    [provider]  ALPRAZolam (XANAX) 0.5 MG tablet Take 0.5 mg by mouth 3 (three) times daily.     [provider]  aspirin EC 81 MG tablet Take 1 tablet (81 mg total) by mouth daily. 04/05/13   Kathie Dike, MD  benztropine (COGENTIN) 1 MG tablet Take 1 tablet (1 mg total) by mouth daily. 04/05/13   Kathie Dike, MD  citalopram (CELEXA) 20 MG tablet Take 1 tablet (20 mg total) by mouth every morning. 04/05/13   Kathie Dike, MD  furosemide (LASIX) 20 MG tablet Take 1 tablet (20 mg total) by mouth daily. 04/30/18 04/30/19  Isaac Bliss, Rayford Halsted, MD  hydrocortisone (CORTEF) 10 MG tablet Take 3 tablets by mouth daily x1 day; then 2 tablet by mouth daily x3 days; then 1 tablet by mouth daily x3 days and stop the use of hydrocortisone. Patient not taking: Reported on 03/18/2019 09/28/18   Barton Dubois, MD  ipratropium-albuterol (DUONEB) 0.5-2.5 (3) MG/3ML SOLN Take 3 mLs by nebulization 4 (four) times daily. 09/28/18   Barton Dubois, MD  loratadine (CLARITIN) 10 MG tablet Take 1 tablet (10 mg total) by mouth daily as needed for allergies. 09/28/18 09/28/19  Barton Dubois, MD   metoprolol (LOPRESSOR) 50 MG tablet Take 0.5 tablets (25 mg total) by mouth 2 (two) times daily. 09/24/13   Kathie Dike, MD  rivastigmine (EXELON) 9.5 mg/24hr Place 1 patch (9.5 mg total) onto the skin daily. *Remove and discard used patches* 04/05/13   Kathie Dike, MD  zolpidem (AMBIEN) 10 MG tablet Take 10 mg by mouth at bedtime as needed for sleep.    [provider]    Family History Family History  Problem Relation Age of Onset   Aneurysm Mother    Diabetes Father     Social History Social History   Tobacco Use   Smoking status: Never Smoker   Smokeless tobacco: Never Used  Substance Use Topics   Alcohol use: No   Drug use: No     Allergies   Adhesive [tape], Ensure pudding [nutritional supplements], and Penicillins   Review of Systems Review of Systems  Unable to perform ROS: Dementia     Physical Exam Updated Vital Signs BP 131/69    Pulse 61    Temp 98.6 F (37 C)    Resp 19    SpO2 100%   Physical Exam Vitals signs and nursing note reviewed.  Constitutional:      Appearance: She is well-developed.  HENT:     Head: Normocephalic and atraumatic.  Eyes:     General:        Right eye: No discharge.        Left eye: No discharge.     Conjunctiva/sclera: Conjunctivae normal.  Neck:     Musculoskeletal: Normal range of motion and neck supple.  Cardiovascular:     Rate and Rhythm: Normal rate and regular rhythm.     Heart sounds: Normal heart sounds.  Pulmonary:     Effort: Pulmonary effort is normal.     Breath sounds: Normal breath sounds.  Abdominal:     Palpations: Abdomen is soft.     Tenderness: There is no abdominal tenderness.  Musculoskeletal:        General: Swelling present.     Right hip: Normal.     Right knee: She exhibits swelling. Tenderness found.     Right ankle: Normal.     Right lower leg: Edema present.     Left lower leg: Edema present.     Comments: 1-2+ symmetric edema of bilateral ankles.   Skin:     General: Skin is warm and dry.  Neurological:     Mental Status: She is alert.     Comments: Garbled speech  ED Treatments / Results  Labs (all labs ordered are listed, but only abnormal results are displayed) Labs Reviewed  BASIC METABOLIC PANEL - Abnormal; Notable for the following components:      Result Value   Sodium 147 (*)    Chloride 115 (*)    Glucose, Bld 126 (*)    Calcium 8.3 (*)    GFR calc non Af Amer 57 (*)    All other components within normal limits  CBC WITH DIFFERENTIAL/PLATELET - Abnormal; Notable for the following components:   WBC 12.2 (*)    RBC 3.42 (*)    Hemoglobin 7.4 (*)    HCT 26.4 (*)    MCV 77.2 (*)    MCH 21.6 (*)    MCHC 28.0 (*)    RDW 19.3 (*)    Platelets 442 (*)    Neutro Abs 10.1 (*)    Abs Immature Granulocytes 0.08 (*)    All other components within normal limits  PROTIME-INR  TYPE AND SCREEN    EKG EKG Interpretation  Date/Time:  Tuesday March 18 2019 13:45:52 EDT Ventricular Rate:  63 PR Interval:    QRS Duration: 107 QT Interval:  475 QTC Calculation: 487 R Axis:   59 Text Interpretation:  Poor data quality Normal sinus rhythm Ventricular premature complex Low voltage, precordial leads Anteroseptal infarct, old artifact due to pt shaking When compared with ECG of 09/26/2018 Rate slower Confirmed by Francine Graven [phone]) on 03/18/2019 2:11:15 PM   Radiology Dg Chest 1 View  Result Date: 03/18/2019 CLINICAL DATA:  Femur fracture. EXAM: CHEST  1 VIEW COMPARISON:  Radiograph September 26, 2018. FINDINGS: Stable cardiomegaly. Both lungs are clear. The visualized skeletal structures are unremarkable. IMPRESSION: No active disease. Aortic Atherosclerosis (ICD10-I70.0). Electronically Signed   By: Marijo Conception M.D.   On: 03/18/2019 13:47   Dg Pelvis 1-2 Views  Result Date: 03/18/2019 CLINICAL DATA:  Right leg pain. EXAM: PELVIS - 1-2 VIEW COMPARISON:  MRI 01/31/2013. FINDINGS: Diffuse osteopenia. Degenerative changes  lumbar spine and both hips. Surgical screws right hip. Hardware intact. Anatomic alignment. Stable deformity noted of the left pubis consistent with old healed fractures. No acute bony abnormality identified. Surgical clips noted over the abdomen. Peripheral vascular calcification. IMPRESSION: 1. Diffuse osteopenia. Degenerative changes lumbar spine and both hips. Surgical screws right hip. Hardware intact. Anatomic alignment. Stable deformity noted the left pubis consistent old healed fractures. No acute bony abnormality identified. 2.  Peripheral vascular disease. Electronically Signed   By: Marcello Moores  Register   On: 03/18/2019 13:46   Dg Knee 1-2 Views Right  Result Date: 03/18/2019 Clinical:  History of fall and marked knee pain right X-rays were done of the right knee, two views. There is a displaced anteriorly and medially distal femur fracture at border of diaphysis and metaphysis with comminution.  Soft tissue swelling is present.  There is a screw and wire in the proximal tibia from the lateral side. Significant degenerative changes are present in the knee, more laterally.  The fracture does not appear to enter the condyles or the condylar notch on these two views.  She is in pain and positioning was difficult.  Bone quality is osteopenic. Impression:  Acute displaced distal femur fracture at distal diaphysis and metaphysis of the right knee, osteopenia. Electronically Signed Sanjuana Kava, MD 6/30/202011:22 AM   Dg Femur Min 2 Views Right  Result Date: 03/18/2019 CLINICAL DATA:  Right leg pain. EXAM: RIGHT FEMUR 2 VIEWS COMPARISON:  None. FINDINGS:  Severely comminuted and displaced fracture is seen involving the distal right femur. Status post surgical internal fixation of old proximal right femoral fracture is noted. Vascular calcifications are noted. IMPRESSION: Severely comminuted and displaced fracture is seen involving the distal right femur. Electronically Signed   By: Marijo Conception M.D.   On:  03/18/2019 13:46    Procedures Procedures (including critical care time)  Medications Ordered in ED Medications - No data to display   Initial Impression / Assessment and Plan / ED Course  I have reviewed the triage vital signs and the nursing notes.  Pertinent labs & imaging results that were available during my care of the patient were reviewed by me and considered in my medical decision making (see chart for details).       Patient seen and examined.  X-rays, labs ordered.  Attempted to call daughter, no answer.  Received fax from PCP office with history and x-ray report.  Vital signs reviewed and are as follows: BP 131/69    Pulse 61    Temp 98.6 F (37 C)    Resp 19    SpO2 100%   I was able to get a hold of the patient's daughter.  Confirms history given previously.  Patient was at orthopedic office today and was referred directly to the emergency department.  Discussed case with Dr. Thurnell Garbe.  I have reviewed case with Dr. Stann Mainland who is at [facility].  He agrees to see patient as a consult.  He does not know if surgery will be a viable option given the patient's baseline status.  He would like to see patient in person and help give recommendations on how best to proceed.  I spoke with Dr. [name] who will evaluate patient in the emergency department and arrange for transfer to [facility].  I updated daughter by telephone who is currently aware of plan and is in agreement.  BP 131/69    Pulse 61    Temp 98.6 F (37 C)    Resp 19    SpO2 100%    Final Clinical Impressions(s) / ED Diagnoses   Final diagnoses:  Closed fracture of left femur, unspecified fracture morphology, unspecified portion of femur, initial encounter [facility])   Admit for further orthopedic evaluation and long-term management plans.  ED Discharge Orders    None       Carlisle Cater, PA-C 03/18/19 [place], [place], [place] 03/19/19 [phone]

## 2019-03-19 DIAGNOSIS — I10 Essential (primary) hypertension: Secondary | ICD-10-CM

## 2019-03-19 DIAGNOSIS — I5032 Chronic diastolic (congestive) heart failure: Secondary | ICD-10-CM

## 2019-03-19 DIAGNOSIS — G20 Parkinson's disease: Secondary | ICD-10-CM

## 2019-03-19 DIAGNOSIS — F0281 Dementia in other diseases classified elsewhere with behavioral disturbance: Secondary | ICD-10-CM

## 2019-03-19 LAB — CBC
HCT: 25.3 % — ABNORMAL LOW (ref 36.0–46.0)
Hemoglobin: 7.1 g/dL — ABNORMAL LOW (ref 12.0–15.0)
MCH: 21.7 pg — ABNORMAL LOW (ref 26.0–34.0)
MCHC: 28.1 g/dL — ABNORMAL LOW (ref 30.0–36.0)
MCV: 77.4 fL — ABNORMAL LOW (ref 80.0–100.0)
Platelets: 404 10*3/uL — ABNORMAL HIGH (ref 150–400)
RBC: 3.27 MIL/uL — ABNORMAL LOW (ref 3.87–5.11)
RDW: 19.3 % — ABNORMAL HIGH (ref 11.5–15.5)
WBC: 10.3 10*3/uL (ref 4.0–10.5)
nRBC: 0 % (ref 0.0–0.2)

## 2019-03-19 LAB — BASIC METABOLIC PANEL
Anion gap: 4 — ABNORMAL LOW (ref 5–15)
BUN: 14 mg/dL (ref 8–23)
CO2: 26 mmol/L (ref 22–32)
Calcium: 8.4 mg/dL — ABNORMAL LOW (ref 8.9–10.3)
Chloride: 114 mmol/L — ABNORMAL HIGH (ref 98–111)
Creatinine, Ser: 0.75 mg/dL (ref 0.44–1.00)
GFR calc Af Amer: >60 mL/min (ref >60)
GFR calc non Af Amer: >60 mL/min (ref >60)
Glucose, Bld: 116 mg/dL — ABNORMAL HIGH (ref 70–99)
Potassium: 3.9 mmol/L (ref 3.5–5.1)
Sodium: 144 mmol/L (ref 135–145)

## 2019-03-19 LAB — SURGICAL PCR SCREEN
MRSA, PCR: NEGATIVE
Staphylococcus aureus: NEGATIVE

## 2019-03-19 MED ORDER — DEXTROSE-NACL 5-0.45 % IV SOLN
INTRAVENOUS | Status: AC
  Administered 2019-03-19: 17:00:00 via INTRAVENOUS

## 2019-03-19 MED ORDER — ENOXAPARIN SODIUM 40 MG/0.4ML ~~LOC~~ SOLN
40.0000 mg | SUBCUTANEOUS | Status: DC
  Administered 2019-03-19: 40 mg via SUBCUTANEOUS
  Filled 2019-03-19: qty 0.4

## 2019-03-19 NOTE — Consult Note (Signed)
Reason for Consult:Right femur fx Referring Physician: Milagros Reap is an 85 y.o. female.  HPI: [name] fell getting off the toilet on 6/21. She can stand with assistance but does not ambulate and has advanced Alzheimer's and is uncommunicative. She was diagnosed with a supercondylar femur fx at an outside clinic and was sent to the ED for definitive treatment. Surgery was recommended to stabilize the fracture but the family was unsure about proceeding with that given her age and quality of life. I spoke with the daughter this morning and they decided they did not wish to go ahead with surgery and would just like to take her home.  Past Medical History:  Diagnosis Date  . Alzheimer's dementia ([place])   . Cancer (H[facility])    breast  . CHF (congestive heart failure) ([place])   . Heart attack ([place])   . Hypertension   . Parkinson disease [facility])     Past Surgical History:  Procedure Laterality Date  . ABDOMINAL HYSTERECTOMY    . arm surgery    . BREAST SURGERY    . CHOLECYSTECTOMY    . COLON SURGERY    . HIP PINNING,CANNULATED Right 02/01/2013   Procedure: CANNULATED HIP PINNING;  Surgeon: Sanjuana Kava, MD;  Location: [facility];  Service: Orthopedics;  Laterality: Right;  . LEG SURGERY    . MASTECTOMY    . PELVIC FRACTURE SURGERY    . small intestine removed    . TONSILLECTOMY      Family History  Problem Relation Age of Onset  . Aneurysm Mother   . Diabetes Father     Social History:  reports that she has never smoked. She has never used smokeless tobacco. She reports that she does not drink alcohol or use drugs.  Allergies:  Allergies  Allergen Reactions  . Adhesive [Tape] Rash  . Ensure Pudding [Nutritional Supplements] Swelling and Rash    Throat swelling  . Penicillins Swelling and Rash    Has patient had a PCN reaction causing immediate rash, facial/tongue/throat swelling, SOB or lightheadedness with hypotension: Yes Has patient had a PCN reaction causing severe rash  involving mucus membranes or skin necrosis: Yes Has patient had a PCN reaction that required hospitalization No Has patient had a PCN reaction occurring within the last 10 years: No If all of the above answers are "NO", then may proceed with Cephalosporin use.    Medications: I have reviewed the patient's current medications.  Results for orders placed or performed during the hospital encounter of 03/18/19 (from the past 48 hour(s))  Basic metabolic panel     Status: Abnormal   Collection Time: 03/18/19  2:05 PM  Result Value Ref Range   Sodium 147 (H) 135 - 145 mmol/L   Potassium 3.9 3.5 - 5.1 mmol/L   Chloride 115 (H) 98 - 111 mmol/L   CO2 24 22 - 32 mmol/L   Glucose, Bld 126 (H) 70 - 99 mg/dL   BUN 21 8 - 23 mg/dL   Creatinine, Ser 0.86 0.44 - 1.00 mg/dL   Calcium 8.3 (L) 8.9 - 10.3 mg/dL   GFR calc non Af Amer 57 (L) >60 mL/min   GFR calc Af Amer >60 >60 mL/min   Anion gap 8 5 - 15    Comment: Performed at [facility], [address] [place], [place] [address]  CBC WITH DIFFERENTIAL     Status: Abnormal   Collection Time: 03/18/19  2:05 PM  Result Value Ref Range  WBC 12.2 (H) 4.0 - 10.5 K/uL   RBC 3.42 (L) 3.87 - 5.11 MIL/uL   Hemoglobin 7.4 (L) 12.0 - 15.0 g/dL    Comment: Reticulocyte Hemoglobin testing may be clinically indicated, consider ordering this additional test [id number]    HCT 26.4 (L) 36.0 - 46.0 %   MCV 77.2 (L) 80.0 - 100.0 fL   MCH 21.6 (L) 26.0 - 34.0 pg   MCHC 28.0 (L) 30.0 - 36.0 g/dL   RDW 19.3 (H) 11.5 - 15.5 %   Platelets 442 (H) 150 - 400 K/uL   nRBC 0.0 0.0 - 0.2 %   Neutrophils Relative % 82 %   Neutro Abs 10.1 (H) 1.7 - 7.7 K/uL   Lymphocytes Relative 9 %   Lymphs Abs 1.1 0.7 - 4.0 K/uL   Monocytes Relative 7 %   Monocytes Absolute 0.9 0.1 - 1.0 K/uL   Eosinophils Relative 1 %   Eosinophils Absolute 0.1 0.0 - 0.5 K/uL   Basophils Relative 0 %   Basophils Absolute 0.0 0.0 - 0.1 K/uL   Immature Granulocytes 1 %   Abs Immature  Granulocytes 0.08 (H) 0.00 - 0.07 K/uL    Comment: Performed at [facility], [address], [place], [place] [address]  Protime-INR     Status: None   Collection Time: 03/18/19  2:05 PM  Result Value Ref Range   Prothrombin Time 14.2 11.4 - 15.2 seconds   INR 1.1 0.8 - 1.2    Comment: (NOTE) INR goal varies based on device and disease states. Performed at [facility], [address], [place], [place] [address]   Type and screen [facility]     Status: None   Collection Time: 03/18/19  2:05 PM  Result Value Ref Range   ABO/RH(D) A POS    Antibody Screen NEG    Sample Expiration      03/21/2019,2359 Performed at [facility], [address], [place], [place] [address]   SARS Coronavirus 2 (CEPHEID - Performed in [facility] hospital lab), Hosp Order     Status: None   Collection Time: 03/18/19  4:30 PM   Specimen: Nasopharyngeal Swab  Result Value Ref Range   SARS Coronavirus 2 NEGATIVE NEGATIVE    Comment: (NOTE) If result is NEGATIVE SARS-CoV-2 target nucleic acids are NOT DETECTED. The SARS-CoV-2 RNA is generally detectable in upper and lower  respiratory specimens during the acute phase of infection. The lowest  concentration of SARS-CoV-2 viral copies this assay can detect is 250  copies / mL. A negative result does not preclude SARS-CoV-2 infection  and should not be used as the sole basis for treatment or other  patient management decisions.  A negative result may occur with  improper specimen collection / handling, submission of specimen other  than nasopharyngeal swab, presence of viral mutation(s) within the  areas targeted by this assay, and inadequate number of viral copies  (<250 copies / mL). A negative result must be combined with clinical  observations, patient history, and epidemiological information. If result is POSITIVE SARS-CoV-2 target nucleic acids are DETECTED. The SARS-CoV-2 RNA is generally detectable in upper and lower  respiratory specimens  dur ing the acute phase of infection.  Positive  results are indicative of active infection with SARS-CoV-2.  Clinical  correlation with patient history and other diagnostic information is  necessary to determine patient infection status.  Positive results do  not rule out bacterial infection or co-infection with other viruses. If result is  PRESUMPTIVE POSTIVE SARS-CoV-2 nucleic acids MAY BE PRESENT.   A presumptive positive result was obtained on the submitted specimen  and confirmed on repeat testing.  While 2019 novel coronavirus  (SARS-CoV-2) nucleic acids may be present in the submitted sample  additional confirmatory testing may be necessary for epidemiological  and / or clinical management purposes  to differentiate between  SARS-CoV-2 and other Sarbecovirus currently known to infect humans.  If clinically indicated additional testing with an alternate test  methodology [phone]) is advised. The SARS-CoV-2 RNA is generally  detectable in upper and lower respiratory sp ecimens during the acute  phase of infection. The expected result is Negative. Fact Sheet for Patients:  StrictlyIdeas.no Fact Sheet for Healthcare Providers: BankingDealers.co.za This test is not yet approved or cleared by the [place] FDA and has been authorized for detection and/or diagnosis of SARS-CoV-2 by FDA under an Emergency Use Authorization (EUA).  This EUA will remain in effect (meaning this test can be used) for the duration of the COVID-19 declaration under Section 564(b)(1) of the Act, 21 U.S.C. section 360bbb-3(b)(1), unless the authorization is terminated or revoked sooner. Performed at [facility], [address] [place], [place] [address]   Surgical pcr screen     Status: None   Collection Time: 03/18/19 11:19 PM   Specimen: Nasal Mucosa; Nasal Swab  Result Value Ref Range   MRSA, PCR NEGATIVE NEGATIVE   Staphylococcus aureus NEGATIVE  NEGATIVE    Comment: (NOTE) The Xpert SA Assay (FDA approved for NASAL specimens in patients 58 years of age and older), is one component of a comprehensive surveillance program. It is not intended to diagnose infection nor to guide or monitor treatment. Performed at [facility], [address] [place], [place] [address]     Dg Chest 1 View  Result Date: 03/18/2019 CLINICAL DATA:  Femur fracture. EXAM: CHEST  1 VIEW COMPARISON:  Radiograph September 26, 2018. FINDINGS: Stable cardiomegaly. Both lungs are clear. The visualized skeletal structures are unremarkable. IMPRESSION: No active disease. Aortic Atherosclerosis (ICD10-I70.0). Electronically Signed   By: Marijo Conception M.D.   On: 03/18/2019 13:47   Dg Pelvis 1-2 Views  Result Date: 03/18/2019 CLINICAL DATA:  Right leg pain. EXAM: PELVIS - 1-2 VIEW COMPARISON:  MRI 01/31/2013. FINDINGS: Diffuse osteopenia. Degenerative changes lumbar spine and both hips. Surgical screws right hip. Hardware intact. Anatomic alignment. Stable deformity noted of the left pubis consistent with old healed fractures. No acute bony abnormality identified. Surgical clips noted over the abdomen. Peripheral vascular calcification. IMPRESSION: 1. Diffuse osteopenia. Degenerative changes lumbar spine and both hips. Surgical screws right hip. Hardware intact. Anatomic alignment. Stable deformity noted the left pubis consistent old healed fractures. No acute bony abnormality identified. 2.  Peripheral vascular disease. Electronically Signed   By: Marcello Moores  Register   On: 03/18/2019 13:46   Dg Knee 1-2 Views Right  Result Date: 03/18/2019 Clinical:  History of fall and marked knee pain right X-rays were done of the right knee, two views. There is a displaced anteriorly and medially distal femur fracture at border of diaphysis and metaphysis with comminution.  Soft tissue swelling is present.  There is a screw and wire in the proximal tibia from the lateral side.  Significant degenerative changes are present in the knee, more laterally.  The fracture does not appear to enter the condyles or the condylar notch on these two views.  She is in pain and positioning was difficult.  Bone quality is osteopenic.  Impression:  Acute displaced distal femur fracture at distal diaphysis and metaphysis of the right knee, osteopenia. Electronically Signed Sanjuana Kava, MD 6/30/202011:22 AM   Ct Knee Right Wo Contrast  Result Date: 03/18/2019 CLINICAL DATA:  [place].  Displaced femur fracture. EXAM: CT OF THE right KNEE WITHOUT CONTRAST TECHNIQUE: Multidetector CT imaging of the right knee was performed according to the standard protocol. Multiplanar CT image reconstructions were also generated. COMPARISON:  Radiographs, same date. FINDINGS: There is a comminuted transverse fracture through the supracondylar region of the femur with approximately 18 mm of lateral displacement and 15 mm of posterior displacement. No involvement of the condyles or articular surface of the femur. The patella is intact. There is significant motion artifact involving the tibia. Remote hardware is noted from fixation of a lateral plateau fracture but no evidence of acute fracture involving the tibia or fibula despite the motion artifact. IMPRESSION: 1. Displaced supracondylar femur fracture with lateral and posterior displacement and mild comminution. 2. Remote lateral tibial plateau fracture with hardware in place. No definite recurrent fracture despite motion artifact. Electronically Signed   By: Marijo Sanes M.D.   On: 03/18/2019 19:17   Dg Femur Min 2 Views Right  Result Date: 03/18/2019 CLINICAL DATA:  Right leg pain. EXAM: RIGHT FEMUR 2 VIEWS COMPARISON:  None. FINDINGS: Severely comminuted and displaced fracture is seen involving the distal right femur. Status post surgical internal fixation of old proximal right femoral fracture is noted. Vascular calcifications are noted. IMPRESSION: Severely  comminuted and displaced fracture is seen involving the distal right femur. Electronically Signed   By: Marijo Conception M.D.   On: 03/18/2019 13:46    Review of Systems  Unable to perform ROS: Dementia   Blood pressure (!) 148/118, pulse 61, temperature 98.8 F (37.1 C), temperature source Axillary, resp. rate 18, weight 71 kg, SpO2 94 %. Physical Exam  Constitutional: She appears well-developed and well-nourished. No distress.  HENT:  Head: Normocephalic and atraumatic.  Eyes: Conjunctivae are normal. Right eye exhibits no discharge. Left eye exhibits no discharge. No scleral icterus.  Neck: Normal range of motion.  Cardiovascular: Normal rate and regular rhythm.  Respiratory: Effort normal. No respiratory distress.  Musculoskeletal:     Comments: RLE No traumatic wounds, ecchymosis, or rash  TTP distal thigh, short KI in place  No ankle effusion  Sens DPN, SPN, TN could not assess  Motor EHL, ext, flex, evers could not assess  DP 1+, PT 0, 1+ NP edema  Neurological: She is alert.  Skin: Skin is warm and dry. She is not diaphoretic.  Psychiatric: Her affect is blunt. She is noncommunicative.    Assessment/Plan: Right femur fx -- I think taking the patient home and caring for her without surgery is certainly reasonable given her age, dementia, and non-ambulatory status. I will order her a longer KI to give her more stability. Should the family decide they would like to have surgery for palliation they can certainly change their minds. F/u with Dr. Stann Mainland in 2 weeks or so.    Lisette Abu, PA-C Orthopedic Surgery [phone] 03/19/2019, 9:31 AM

## 2019-03-19 NOTE — Progress Notes (Signed)
PROGRESS NOTE    [name]  YTK:[id number] DOB: [birth year] DOA: 03/18/2019 PCP: Iona Beard, MD    Brief Narrative:  85 y/o female with history of parkinsons, alzheimers dementia, copd, diastolic chf, was sent to ED from orthopedics office for evaluation of right femur fracture. After being admitted to the hospital she was evaluated by ortho and it was felt that management would be non operative. Patient is non ambulatory at baseline. She as also noted to have a significant decline in hemoglobin and was noted to be dehydrated/hypernatremic. Patient is being hydrated with IV fluids. Plans are for discharge home in AM if hemoglobin remains stable and electrolytes are improved.   Assessment & Plan:   Principal Problem:   Closed fracture of distal end of right femur, initial encounter ([place]) Active Problems:   Dementia with behavioral disturbance ([place])   Parkinson disease ([place])   Essential hypertension   Depression with anxiety   Chronic diastolic CHF (congestive heart failure) ([place])   1. Right distal femur fracture. Seen by orthopedics and after speaking to patient's daughter, considering patient's advanced age and comorbidities, a non operative approach is being taken. She is currently in a knee immobilizer. Follow up with ortho in 2 weeks 2. Microcytic anemia. Baseline hemoglobin 9-10. Hgb 7.4 on admission and has trended down to 7.1. Suspect iron deficiency. Will check iron and ferritin levels. Repeat labs in AM. Anticipate further hemodilution with Iv fluids. Transfuse for hemoglobin <7. She has not had any obvious signs of bleeding 3. Hypernatremia. Likely related to decreased po intake. Continue on hypotonic fluids 4. Chronic diastolic chf. Appears compensated at this time. Lasix currently on hold since she appears volume depleted. 5. HTN. Continue lopressor 6. COPD. No resp distress. Continue nebs as needed 7. Advanced dementia/parkinsons disease. Continue on cogentin and  rivastigmine patch   DVT prophylaxis: lovenox  Code Status: DNR Family Communication: discussed with daughter Disposition Plan: return home tomorrow if hemoglobin/electrolytes are stable   Consultants:     Procedures:     Antimicrobials:      Subjective: Non verbal. Does not appear to be in distress  Objective: Vitals:   03/19/19 0015 03/19/19 0500 03/19/19 1024 03/19/19 1524  BP: (!) 109/48 (!) 148/118 130/68 (!) 115/100  Pulse: (!) 58 61 74 78  Resp: 18 18 18 20   Temp: 98.5 F (36.9 C) 98.8 F (37.1 C) 98.9 F (37.2 C) 99 F (37.2 C)  TempSrc: Oral Axillary Oral Oral  SpO2: 98% 94% 94% 99%  Weight:  71 kg      Intake/Output Summary (Last 24 hours) at 03/19/2019 1659 Last data filed at 03/19/2019 1300 Gross per 24 hour  Intake 636.11 ml  Output 300 ml  Net 336.11 ml   Filed Weights   03/19/19 0500  Weight: 71 kg    Examination:  General exam: Appears calm and comfortable  Respiratory system: Clear to auscultation. Respiratory effort normal. Cardiovascular system: S1 & S2 heard, RRR. No JVD, murmurs, rubs, gallops or clicks.  Gastrointestinal system: Abdomen is nondistended, soft and nontender. No organomegaly or masses felt. Normal bowel sounds heard. Central nervous system:  No focal neurological deficits. Extremities: trace edema bilaterally Skin: No rashes, lesions or ulcers Psychiatry: nonverbal    Data Reviewed: I have personally reviewed following labs and imaging studies  CBC: Recent Labs  Lab 03/18/19 1405 03/19/19 1514  WBC 12.2* 10.3  NEUTROABS 10.1*  --   HGB 7.4* 7.1*  HCT 26.4* 25.3*  MCV 77.2* 77.4*  PLT 442* 425*   Basic Metabolic Panel: Recent Labs  Lab 03/18/19 1405  NA 147*  K 3.9  CL 115*  CO2 24  GLUCOSE 126*  BUN 21  CREATININE 0.86  CALCIUM 8.3*   GFR: Estimated Creatinine Clearance: 33.7 mL/min (by C-G formula based on SCr of 0.86 mg/dL). Liver Function Tests: No results for input(s): AST, ALT,  ALKPHOS, BILITOT, PROT, ALBUMIN in the last 168 hours. No results for input(s): LIPASE, AMYLASE in the last 168 hours. No results for input(s): AMMONIA in the last 168 hours. Coagulation Profile: Recent Labs  Lab 03/18/19 1405  INR 1.1   Cardiac Enzymes: No results for input(s): CKTOTAL, CKMB, CKMBINDEX, TROPONINI in the last 168 hours. BNP (last 3 results) No results for input(s): PROBNP in the last 8760 hours. HbA1C: No results for input(s): HGBA1C in the last 72 hours. CBG: No results for input(s): GLUCAP in the last 168 hours. Lipid Profile: No results for input(s): CHOL, HDL, LDLCALC, TRIG, CHOLHDL, LDLDIRECT in the last 72 hours. Thyroid Function Tests: No results for input(s): TSH, T4TOTAL, FREET4, T3FREE, THYROIDAB in the last 72 hours. Anemia Panel: No results for input(s): VITAMINB12, FOLATE, FERRITIN, TIBC, IRON, RETICCTPCT in the last 72 hours. Sepsis Labs: No results for input(s): PROCALCITON, LATICACIDVEN in the last 168 hours.  Recent Results (from the past 240 hour(s))  SARS Coronavirus 2 (CEPHEID - Performed in [place] hospital lab), Hosp Order     Status: None   Collection Time: 03/18/19  4:30 PM   Specimen: Nasopharyngeal Swab  Result Value Ref Range Status   SARS Coronavirus 2 NEGATIVE NEGATIVE Final    Comment: (NOTE) If result is NEGATIVE SARS-CoV-2 target nucleic acids are NOT DETECTED. The SARS-CoV-2 RNA is generally detectable in upper and lower  respiratory specimens during the acute phase of infection. The lowest  concentration of SARS-CoV-2 viral copies this assay can detect is 250  copies / mL. A negative result does not preclude SARS-CoV-2 infection  and should not be used as the sole basis for treatment or other  patient management decisions.  A negative result may occur with  improper specimen collection / handling, submission of specimen other  than nasopharyngeal swab, presence of viral mutation(s) within the  areas targeted by this  assay, and inadequate number of viral copies  (<250 copies / mL). A negative result must be combined with clinical  observations, patient history, and epidemiological information. If result is POSITIVE SARS-CoV-2 target nucleic acids are DETECTED. The SARS-CoV-2 RNA is generally detectable in upper and lower  respiratory specimens dur ing the acute phase of infection.  Positive  results are indicative of active infection with SARS-CoV-2.  Clinical  correlation with patient history and other diagnostic information is  necessary to determine patient infection status.  Positive results do  not rule out bacterial infection or co-infection with other viruses. If result is PRESUMPTIVE POSTIVE SARS-CoV-2 nucleic acids MAY BE PRESENT.   A presumptive positive result was obtained on the submitted specimen  and confirmed on repeat testing.  While 2019 novel coronavirus  (SARS-CoV-2) nucleic acids may be present in the submitted sample  additional confirmatory testing may be necessary for epidemiological  and / or clinical management purposes  to differentiate between  SARS-CoV-2 and other Sarbecovirus currently known to infect humans.  If clinically indicated additional testing with an alternate test  methodology [phone]) is advised. The SARS-CoV-2 RNA is generally  detectable in upper and lower respiratory sp ecimens during the acute  phase of infection. The expected result is Negative. Fact Sheet for Patients:  StrictlyIdeas.no Fact Sheet for Healthcare Providers: BankingDealers.co.za This test is not yet approved or cleared by the [place] FDA and has been authorized for detection and/or diagnosis of SARS-CoV-2 by FDA under an Emergency Use Authorization (EUA).  This EUA will remain in effect (meaning this test can be used) for the duration of the COVID-19 declaration under Section 564(b)(1) of the Act, 21 U.S.C. section 360bbb-3(b)(1),  unless the authorization is terminated or revoked sooner. Performed at [facility], [address] [place], [place] [address]   Surgical pcr screen     Status: None   Collection Time: 03/18/19 11:19 PM   Specimen: Nasal Mucosa; Nasal Swab  Result Value Ref Range Status   MRSA, PCR NEGATIVE NEGATIVE Final   Staphylococcus aureus NEGATIVE NEGATIVE Final    Comment: (NOTE) The Xpert SA Assay (FDA approved for NASAL specimens in patients 40 years of age and older), is one component of a comprehensive surveillance program. It is not intended to diagnose infection nor to guide or monitor treatment. Performed at [facility], [place] [address], [place], [place] [address]          Radiology Studies: Dg Chest 1 View  Result Date: 03/18/2019 CLINICAL DATA:  Femur fracture. EXAM: CHEST  1 VIEW COMPARISON:  Radiograph September 26, 2018. FINDINGS: Stable cardiomegaly. Both lungs are clear. The visualized skeletal structures are unremarkable. IMPRESSION: No active disease. Aortic Atherosclerosis (ICD10-I70.0). Electronically Signed   By: Marijo Conception M.D.   On: 03/18/2019 13:47   Dg Pelvis 1-2 Views  Result Date: 03/18/2019 CLINICAL DATA:  Right leg pain. EXAM: PELVIS - 1-2 VIEW COMPARISON:  MRI 01/31/2013. FINDINGS: Diffuse osteopenia. Degenerative changes lumbar spine and both hips. Surgical screws right hip. Hardware intact. Anatomic alignment. Stable deformity noted of the left pubis consistent with old healed fractures. No acute bony abnormality identified. Surgical clips noted over the abdomen. Peripheral vascular calcification. IMPRESSION: 1. Diffuse osteopenia. Degenerative changes lumbar spine and both hips. Surgical screws right hip. Hardware intact. Anatomic alignment. Stable deformity noted the left pubis consistent old healed fractures. No acute bony abnormality identified. 2.  Peripheral vascular disease. Electronically Signed   By: Marcello Moores  Register   On: 03/18/2019 13:46   Dg  Knee 1-2 Views Right  Result Date: 03/18/2019 Clinical:  History of fall and marked knee pain right X-rays were done of the right knee, two views. There is a displaced anteriorly and medially distal femur fracture at border of diaphysis and metaphysis with comminution.  Soft tissue swelling is present.  There is a screw and wire in the proximal tibia from the lateral side. Significant degenerative changes are present in the knee, more laterally.  The fracture does not appear to enter the condyles or the condylar notch on these two views.  She is in pain and positioning was difficult.  Bone quality is osteopenic. Impression:  Acute displaced distal femur fracture at distal diaphysis and metaphysis of the right knee, osteopenia. Electronically Signed Sanjuana Kava, MD 6/30/202011:22 AM   Ct Knee Right Wo Contrast  Result Date: 03/18/2019 CLINICAL DATA:  [place].  Displaced femur fracture. EXAM: CT OF THE right KNEE WITHOUT CONTRAST TECHNIQUE: Multidetector CT imaging of the right knee was performed according to the standard protocol. Multiplanar CT image reconstructions were also generated. COMPARISON:  Radiographs, same date. FINDINGS: There is a comminuted transverse fracture through the supracondylar region of the femur with approximately 18  mm of lateral displacement and 15 mm of posterior displacement. No involvement of the condyles or articular surface of the femur. The patella is intact. There is significant motion artifact involving the tibia. Remote hardware is noted from fixation of a lateral plateau fracture but no evidence of acute fracture involving the tibia or fibula despite the motion artifact. IMPRESSION: 1. Displaced supracondylar femur fracture with lateral and posterior displacement and mild comminution. 2. Remote lateral tibial plateau fracture with hardware in place. No definite recurrent fracture despite motion artifact. Electronically Signed   By: Marijo Sanes M.D.   On: 03/18/2019 19:17    Dg Femur Min 2 Views Right  Result Date: 03/18/2019 CLINICAL DATA:  Right leg pain. EXAM: RIGHT FEMUR 2 VIEWS COMPARISON:  None. FINDINGS: Severely comminuted and displaced fracture is seen involving the distal right femur. Status post surgical internal fixation of old proximal right femoral fracture is noted. Vascular calcifications are noted. IMPRESSION: Severely comminuted and displaced fracture is seen involving the distal right femur. Electronically Signed   By: Marijo Conception M.D.   On: 03/18/2019 13:46        Scheduled Meds:  benztropine  1 mg Oral Daily   citalopram  20 mg Oral q morning - 10a   metoprolol tartrate  25 mg Oral BID   rivastigmine  9.5 mg Transdermal Daily   sodium chloride flush  10-40 mL Intracatheter Q12H   Continuous Infusions:  dextrose 5 % and 0.45% NaCl       LOS: 1 day    Time spent: 52mins    Kathie Dike, MD [facility]   If 7PM-7AM, please contact night-coverage [email or web address]  03/19/2019, 4:59 PM

## 2019-03-19 NOTE — Progress Notes (Signed)
I Have talked with daughter [name]) today several times concerning her mom.

## 2019-03-19 NOTE — Progress Notes (Signed)
Orthopedic Tech Progress Note Patient Details:  KAZUE CERRO Jan 03, 1920 [id number]  Ortho Devices Type of Ortho Device: Knee Immobilizer Ortho Device/Splint Location: LRE Ortho Device/Splint Interventions: Adjustment, Application, Ordered   Post Interventions Patient Tolerated: Well Instructions Provided: Care of device, Adjustment of device   Janit Pagan 03/19/2019, 10:45 AM

## 2019-03-19 NOTE — Social Work (Signed)
Pt does not have any documentation indicating a court appointed legal guardian, per chart review. [facility] for "legal guardian" discontinued.   Westley Hummer, MSW, [place] Work [phone]

## 2019-03-20 ENCOUNTER — Other Ambulatory Visit: Payer: Self-pay

## 2019-03-20 DIAGNOSIS — F418 Other specified anxiety disorders: Secondary | ICD-10-CM

## 2019-03-20 LAB — BASIC METABOLIC PANEL
Anion gap: 5 (ref 5–15)
BUN: 12 mg/dL (ref 8–23)
CO2: 25 mmol/L (ref 22–32)
Calcium: 8.3 mg/dL — ABNORMAL LOW (ref 8.9–10.3)
Chloride: 113 mmol/L — ABNORMAL HIGH (ref 98–111)
Creatinine, Ser: 0.74 mg/dL (ref 0.44–1.00)
GFR calc Af Amer: >60 mL/min (ref >60)
GFR calc non Af Amer: >60 mL/min (ref >60)
Glucose, Bld: 117 mg/dL — ABNORMAL HIGH (ref 70–99)
Potassium: 3.5 mmol/L (ref 3.5–5.1)
Sodium: 143 mmol/L (ref 135–145)

## 2019-03-20 LAB — CBC
HCT: 24.4 % — ABNORMAL LOW (ref 36.0–46.0)
Hemoglobin: 6.9 g/dL — CL (ref 12.0–15.0)
MCH: 21.8 pg — ABNORMAL LOW (ref 26.0–34.0)
MCHC: 28.3 g/dL — ABNORMAL LOW (ref 30.0–36.0)
MCV: 77.2 fL — ABNORMAL LOW (ref 80.0–100.0)
Platelets: 424 10*3/uL — ABNORMAL HIGH (ref 150–400)
RBC: 3.16 MIL/uL — ABNORMAL LOW (ref 3.87–5.11)
RDW: 19.3 % — ABNORMAL HIGH (ref 11.5–15.5)
WBC: 10 10*3/uL (ref 4.0–10.5)
nRBC: 0 % (ref 0.0–0.2)

## 2019-03-20 LAB — FERRITIN: Ferritin: 30 ng/mL (ref 11–307)

## 2019-03-20 LAB — HEMOGLOBIN AND HEMATOCRIT, BLOOD
HCT: 32.4 % — ABNORMAL LOW (ref 36.0–46.0)
Hemoglobin: 9.9 g/dL — ABNORMAL LOW (ref 12.0–15.0)

## 2019-03-20 LAB — IRON AND TIBC
Iron: 28 ug/dL (ref 28–170)
Saturation Ratios: 8 % — ABNORMAL LOW (ref 10.4–31.8)
TIBC: 339 ug/dL (ref 250–450)
UIBC: 311 ug/dL

## 2019-03-20 LAB — ABO/RH: ABO/RH(D): A POS

## 2019-03-20 LAB — PREPARE RBC (CROSSMATCH)

## 2019-03-20 MED ORDER — PANTOPRAZOLE SODIUM 40 MG PO TBEC: 40.0000 mg | DELAYED_RELEASE_TABLET | Freq: Every day | ORAL | 1 refills | Status: AC

## 2019-03-20 MED ORDER — ZOLPIDEM TARTRATE 10 MG PO TABS: 5.0000 mg | ORAL_TABLET | Freq: Every evening | ORAL | 0 refills | Status: AC | PRN

## 2019-03-20 MED ORDER — DOCUSATE SODIUM 100 MG PO CAPS: 100.0000 mg | ORAL_CAPSULE | Freq: Two times a day (BID) | ORAL | 2 refills | Status: AC

## 2019-03-20 MED ORDER — ADULT MULTIVITAMIN W/MINERALS CH
1.0000 | ORAL_TABLET | Freq: Every day | ORAL | Status: DC
  Administered 2019-03-20: 1 via ORAL
  Filled 2019-03-20: qty 1

## 2019-03-20 MED ORDER — OXYCODONE HCL 5 MG PO TABS: 5.0000 mg | ORAL_TABLET | Freq: Two times a day (BID) | ORAL | 0 refills | Status: DC | PRN

## 2019-03-20 MED ORDER — SODIUM CHLORIDE 0.9% IV SOLUTION
Freq: Once | INTRAVENOUS | Status: AC
  Administered 2019-03-20: 08:00:00 via INTRAVENOUS

## 2019-03-20 MED ORDER — FERROUS SULFATE 325 (65 FE) MG PO TBEC: 325.0000 mg | DELAYED_RELEASE_TABLET | Freq: Every day | ORAL | 3 refills | Status: DC

## 2019-03-20 MED ORDER — ENSURE ENLIVE PO LIQD
237.0000 mL | Freq: Two times a day (BID) | ORAL | Status: DC
  Administered 2019-03-20 (×2): 237 mL via ORAL

## 2019-03-20 NOTE — Discharge Summary (Signed)
Physician Discharge Summary  CALLEN ZUBA ZHG:[id number] DOB: 1920/06/06 DOA: 03/18/2019  PCP: Iona Beard, MD  Admit date: 03/18/2019 Discharge date: 03/20/2019  Admitted From: home Disposition:  home  Recommendations for Outpatient Follow-up:  1. Follow up with PCP in 1-2 weeks 2. Repeat CBC on follow up 3. Consider further work up for anemia as an outpatient 4. Follow up with Dr. Stann Mainland, ortho in 2 weeks.  Discharge Condition:stable CODE STATUS:DNR Diet recommendation: soft diet  Brief/Interim Summary: 85 y/o female with history of Parkinson's disease and Alzheimer's dementia, chronic diastolic CHF, HTN and anemia, was admitted [facility] via [facility] with a right distal femur fracture. She was seen by orthopedics, Dr. Stann Mainland. Her case was discussed with her daughter and since the patient is already non ambulatory at baseline, it was felt to be reasonable to manage her fracture non operatively with a knee immobilizer. She will follow up with Dr. Stann Mainland in 2 weeks  She was also noted to have hypernatremia and mild dehydration. This has corrected with administration of hypotonic fluids.  She also had a significant microcytic anemia. Her baseline hemoglobin seems to run between 9-10. She was admitted with a hemoglobin of 7.4. She has a long history of microcytosis. Patient did not have any obvious signs of bleeding. Ferritin was checked and found to be on the low end. With IV fluids, her hemoglobin dipped down to 6.9. She was transfused 1 unit prbc with improvement of hemoglobin. She was started on oral iron replacement therapy. Further work up of anemia may be conducted as an outpatient by pcp as felt appropriate.   Family is requesting that patient discharge home today. They feel they have adequate support and equipment at home to appropriately care for her.  Discharge Diagnoses:  Principal Problem:   Closed fracture of distal end of right femur, initial encounter ([place]) Active Problems:  Dementia with behavioral disturbance ([place])   Parkinson disease ([place])   Essential hypertension   Depression with anxiety   Chronic diastolic CHF (congestive heart failure) [facility])    Discharge Instructions  Discharge Instructions    Diet - low sodium heart healthy   Complete by: As directed    Increase activity slowly   Complete by: As directed      Allergies as of 03/20/2019      Reactions   Adhesive [tape] Rash   Ensure Pudding [nutritional Supplements] Swelling, Rash   Throat swelling   Penicillins Swelling, Rash   Has patient had a PCN reaction causing immediate rash, facial/tongue/throat swelling, SOB or lightheadedness with hypotension: Yes Has patient had a PCN reaction causing severe rash involving mucus membranes or skin necrosis: Yes Has patient had a PCN reaction that required hospitalization No Has patient had a PCN reaction occurring within the last 10 years: No If all of the above answers are "NO", then may proceed with Cephalosporin use.      Medication List    TAKE these medications   acetaminophen 500 MG tablet Commonly known as: TYLENOL Take 500 mg by mouth at bedtime. For pain   ALPRAZolam 0.5 MG tablet Commonly known as: XANAX Take 0.5 mg by mouth 3 (three) times daily.   aspirin EC 81 MG tablet Take 1 tablet (81 mg total) by mouth daily.   benztropine 1 MG tablet Commonly known as: COGENTIN Take 1 tablet (1 mg total) by mouth daily.   citalopram 20 MG tablet Commonly known as: CELEXA Take 1 tablet (20 mg total) by mouth every morning.  docusate sodium 100 MG capsule Commonly known as: Colace Take 1 capsule (100 mg total) by mouth 2 (two) times daily.   ferrous sulfate 325 (65 FE) MG EC tablet Take 1 tablet (325 mg total) by mouth daily.   furosemide 20 MG tablet Commonly known as: Lasix Take 1 tablet (20 mg total) by mouth daily.   ipratropium-albuterol 0.5-2.5 (3) MG/3ML Soln Commonly known as: DUONEB Take 3 mLs by nebulization 4  (four) times daily.   loratadine 10 MG tablet Commonly known as: Claritin Take 1 tablet (10 mg total) by mouth daily as needed for allergies.   metoprolol tartrate 50 MG tablet Commonly known as: LOPRESSOR Take 0.5 tablets (25 mg total) by mouth 2 (two) times daily.   oxyCODONE 5 MG immediate release tablet Commonly known as: Oxy IR/ROXICODONE Take 1 tablet (5 mg total) by mouth every 12 (twelve) hours as needed for severe pain.   pantoprazole 40 MG tablet Commonly known as: Protonix Take 1 tablet (40 mg total) by mouth daily.   rivastigmine 9.5 mg/24hr Commonly known as: EXELON Place 1 patch (9.5 mg total) onto the skin daily. *Remove and discard used patches*   zolpidem 10 MG tablet Commonly known as: AMBIEN Take 0.5 tablets (5 mg total) by mouth at bedtime as needed for sleep. What changed: how much to take      Follow-up Information    Nicholes Stairs, MD. Schedule an appointment as soon as possible for a visit in 2 week(s).   Specialty: Orthopedic Surgery Contact information: [address] [place] [address] [phone]        Iona Beard, MD. Schedule an appointment as soon as possible for a visit in 2 week(s).   Specialty: Family Medicine Contact information: [address] [place] [address] [phone]          Allergies  Allergen Reactions  . Adhesive [Tape] Rash  . Ensure Pudding [Nutritional Supplements] Swelling and Rash    Throat swelling  . Penicillins Swelling and Rash    Has patient had a PCN reaction causing immediate rash, facial/tongue/throat swelling, SOB or lightheadedness with hypotension: Yes Has patient had a PCN reaction causing severe rash involving mucus membranes or skin necrosis: Yes Has patient had a PCN reaction that required hospitalization No Has patient had a PCN reaction occurring within the last 10 years: No If all of the above answers are "NO", then may proceed with Cephalosporin use.     Consultations:  orthopedics   Procedures/Studies: Dg Chest 1 View  Result Date: 03/18/2019 CLINICAL DATA:  Femur fracture. EXAM: CHEST  1 VIEW COMPARISON:  Radiograph September 26, 2018. FINDINGS: Stable cardiomegaly. Both lungs are clear. The visualized skeletal structures are unremarkable. IMPRESSION: No active disease. Aortic Atherosclerosis (ICD10-I70.0). Electronically Signed   By: Marijo Conception M.D.   On: 03/18/2019 13:47   Dg Pelvis 1-2 Views  Result Date: 03/18/2019 CLINICAL DATA:  Right leg pain. EXAM: PELVIS - 1-2 VIEW COMPARISON:  MRI 01/31/2013. FINDINGS: Diffuse osteopenia. Degenerative changes lumbar spine and both hips. Surgical screws right hip. Hardware intact. Anatomic alignment. Stable deformity noted of the left pubis consistent with old healed fractures. No acute bony abnormality identified. Surgical clips noted over the abdomen. Peripheral vascular calcification. IMPRESSION: 1. Diffuse osteopenia. Degenerative changes lumbar spine and both hips. Surgical screws right hip. Hardware intact. Anatomic alignment. Stable deformity noted the left pubis consistent old healed fractures. No acute bony abnormality identified. 2.  Peripheral vascular disease.  Electronically Signed   By: Marcello Moores  Register   On: 03/18/2019 13:46   Dg Knee 1-2 Views Right  Result Date: 03/18/2019 Clinical:  History of fall and marked knee pain right X-rays were done of the right knee, two views. There is a displaced anteriorly and medially distal femur fracture at border of diaphysis and metaphysis with comminution.  Soft tissue swelling is present.  There is a screw and wire in the proximal tibia from the lateral side. Significant degenerative changes are present in the knee, more laterally.  The fracture does not appear to enter the condyles or the condylar notch on these two views.  She is in pain and positioning was difficult.  Bone quality is osteopenic. Impression:  Acute displaced distal femur  fracture at distal diaphysis and metaphysis of the right knee, osteopenia. Electronically Signed Sanjuana Kava, MD 6/30/202011:22 AM   Ct Knee Right Wo Contrast  Result Date: 03/18/2019 CLINICAL DATA:  [place].  Displaced femur fracture. EXAM: CT OF THE right KNEE WITHOUT CONTRAST TECHNIQUE: Multidetector CT imaging of the right knee was performed according to the standard protocol. Multiplanar CT image reconstructions were also generated. COMPARISON:  Radiographs, same date. FINDINGS: There is a comminuted transverse fracture through the supracondylar region of the femur with approximately 18 mm of lateral displacement and 15 mm of posterior displacement. No involvement of the condyles or articular surface of the femur. The patella is intact. There is significant motion artifact involving the tibia. Remote hardware is noted from fixation of a lateral plateau fracture but no evidence of acute fracture involving the tibia or fibula despite the motion artifact. IMPRESSION: 1. Displaced supracondylar femur fracture with lateral and posterior displacement and mild comminution. 2. Remote lateral tibial plateau fracture with hardware in place. No definite recurrent fracture despite motion artifact. Electronically Signed   By: Marijo Sanes M.D.   On: 03/18/2019 19:17   Dg Femur Min 2 Views Right  Result Date: 03/18/2019 CLINICAL DATA:  Right leg pain. EXAM: RIGHT FEMUR 2 VIEWS COMPARISON:  None. FINDINGS: Severely comminuted and displaced fracture is seen involving the distal right femur. Status post surgical internal fixation of old proximal right femoral fracture is noted. Vascular calcifications are noted. IMPRESSION: Severely comminuted and displaced fracture is seen involving the distal right femur. Electronically Signed   By: Marijo Conception M.D.   On: 03/18/2019 13:46       Subjective: nonverbal  Discharge Exam: Vitals:   03/20/19 0548 03/20/19 0738 03/20/19 0745 03/20/19 0802  BP: 108/80 134/72   122/60  Pulse: 63 67  62  Resp:  14  16  Temp: 97.9 F (36.6 C) 98.1 F (36.7 C) 99.1 F (37.3 C) 99 F (37.2 C)  TempSrc: Oral Oral  Oral  SpO2: 98% 100%  99%  Weight:      Height:        General: Pt is alert, awake, not in acute distress Cardiovascular: RRR, S1/S2 +, no rubs, no gallops Respiratory: CTA bilaterally, no wheezing, no rhonchi Abdominal: Soft, NT, ND, bowel sounds + Extremities: no edema, no cyanosis, right leg in knee immobilizer    The results of significant diagnostics from this hospitalization (including imaging, microbiology, ancillary and laboratory) are listed below for reference.     Microbiology: Recent Results (from the past 240 hour(s))  SARS Coronavirus 2 (CEPHEID - Performed in [place] hospital lab), Hosp Order     Status: None   Collection Time: 03/18/19  4:30 PM   Specimen:  Nasopharyngeal Swab  Result Value Ref Range Status   SARS Coronavirus 2 NEGATIVE NEGATIVE Final    Comment: (NOTE) If result is NEGATIVE SARS-CoV-2 target nucleic acids are NOT DETECTED. The SARS-CoV-2 RNA is generally detectable in upper and lower  respiratory specimens during the acute phase of infection. The lowest  concentration of SARS-CoV-2 viral copies this assay can detect is 250  copies / mL. A negative result does not preclude SARS-CoV-2 infection  and should not be used as the sole basis for treatment or other  patient management decisions.  A negative result may occur with  improper specimen collection / handling, submission of specimen other  than nasopharyngeal swab, presence of viral mutation(s) within the  areas targeted by this assay, and inadequate number of viral copies  (<250 copies / mL). A negative result must be combined with clinical  observations, patient history, and epidemiological information. If result is POSITIVE SARS-CoV-2 target nucleic acids are DETECTED. The SARS-CoV-2 RNA is generally detectable in upper and lower  respiratory  specimens dur ing the acute phase of infection.  Positive  results are indicative of active infection with SARS-CoV-2.  Clinical  correlation with patient history and other diagnostic information is  necessary to determine patient infection status.  Positive results do  not rule out bacterial infection or co-infection with other viruses. If result is PRESUMPTIVE POSTIVE SARS-CoV-2 nucleic acids MAY BE PRESENT.   A presumptive positive result was obtained on the submitted specimen  and confirmed on repeat testing.  While 2019 novel coronavirus  (SARS-CoV-2) nucleic acids may be present in the submitted sample  additional confirmatory testing may be necessary for epidemiological  and / or clinical management purposes  to differentiate between  SARS-CoV-2 and other Sarbecovirus currently known to infect humans.  If clinically indicated additional testing with an alternate test  methodology [phone]) is advised. The SARS-CoV-2 RNA is generally  detectable in upper and lower respiratory sp ecimens during the acute  phase of infection. The expected result is Negative. Fact Sheet for Patients:  StrictlyIdeas.no Fact Sheet for Healthcare Providers: BankingDealers.co.za This test is not yet approved or cleared by the [place] FDA and has been authorized for detection and/or diagnosis of SARS-CoV-2 by FDA under an Emergency Use Authorization (EUA).  This EUA will remain in effect (meaning this test can be used) for the duration of the COVID-19 declaration under Section 564(b)(1) of the Act, 21 U.S.C. section 360bbb-3(b)(1), unless the authorization is terminated or revoked sooner. Performed at [facility], [address], [place], [place] [address]   Surgical pcr screen     Status: None   Collection Time: 03/18/19 11:19 PM   Specimen: Nasal Mucosa; Nasal Swab  Result Value Ref Range Status   MRSA, PCR NEGATIVE NEGATIVE Final    Staphylococcus aureus NEGATIVE NEGATIVE Final    Comment: (NOTE) The Xpert SA Assay (FDA approved for NASAL specimens in patients 35 years of age and older), is one component of a comprehensive surveillance program. It is not intended to diagnose infection nor to guide or monitor treatment. Performed at [facility], [place] [address] [place], [place] [address]      Labs: BNP (last 3 results) Recent Labs    09/26/18 1230  BNP 427.0*   Basic Metabolic Panel: Recent Labs  Lab 03/18/19 1405 03/19/19 1710 03/20/19 0312  NA 147* 144 143  K 3.9 3.9 3.5  CL 115* 114* 113*  CO2 24 26 25   GLUCOSE 126* 116* 117*  BUN 21 14 12   CREATININE 0.86 0.75 0.74  CALCIUM 8.3* 8.4* 8.3*   Liver Function Tests: No results for input(s): AST, ALT, ALKPHOS, BILITOT, PROT, ALBUMIN in the last 168 hours. No results for input(s): LIPASE, AMYLASE in the last 168 hours. No results for input(s): AMMONIA in the last 168 hours. CBC: Recent Labs  Lab 03/18/19 1405 03/19/19 1514 03/20/19 0312  WBC 12.2* 10.3 10.0  NEUTROABS 10.1*  --   --   HGB 7.4* 7.1* 6.9*  HCT 26.4* 25.3* 24.4*  MCV 77.2* 77.4* 77.2*  PLT 442* 404* 424*   Cardiac Enzymes: No results for input(s): CKTOTAL, CKMB, CKMBINDEX, TROPONINI in the last 168 hours. BNP: Invalid input(s): POCBNP CBG: No results for input(s): GLUCAP in the last 168 hours. D-Dimer No results for input(s): DDIMER in the last 72 hours. Hgb A1c No results for input(s): HGBA1C in the last 72 hours. Lipid Profile No results for input(s): CHOL, HDL, LDLCALC, TRIG, CHOLHDL, LDLDIRECT in the last 72 hours. Thyroid function studies No results for input(s): TSH, T4TOTAL, T3FREE, THYROIDAB in the last 72 hours.  Invalid input(s): FREET3 Anemia work up Recent Labs    03/20/19 0312  FERRITIN 30  TIBC 339  IRON 28   Urinalysis    Component Value Date/Time   COLORURINE YELLOW 09/26/2018 [place] 09/26/2018 0939   LABSPEC 1.015  09/26/2018 0939   PHURINE 7.0 09/26/2018 0939   GLUCOSEU NEGATIVE 09/26/2018 0939   HGBUR MODERATE (A) 09/26/2018 0939   BILIRUBINUR NEGATIVE 09/26/2018 0939   KETONESUR NEGATIVE 09/26/2018 0939   PROTEINUR NEGATIVE 09/26/2018 0939   UROBILINOGEN 0.2 01/06/2015 1842   NITRITE NEGATIVE 09/26/2018 0939   LEUKOCYTESUR NEGATIVE 09/26/2018 0939   Sepsis Labs Invalid input(s): PROCALCITONIN,  WBC,  LACTICIDVEN Microbiology Recent Results (from the past 240 hour(s))  SARS Coronavirus 2 (CEPHEID - Performed in [place] hospital lab), Hosp Order     Status: None   Collection Time: 03/18/19  4:30 PM   Specimen: Nasopharyngeal Swab  Result Value Ref Range Status   SARS Coronavirus 2 NEGATIVE NEGATIVE Final    Comment: (NOTE) If result is NEGATIVE SARS-CoV-2 target nucleic acids are NOT DETECTED. The SARS-CoV-2 RNA is generally detectable in upper and lower  respiratory specimens during the acute phase of infection. The lowest  concentration of SARS-CoV-2 viral copies this assay can detect is 250  copies / mL. A negative result does not preclude SARS-CoV-2 infection  and should not be used as the sole basis for treatment or other  patient management decisions.  A negative result may occur with  improper specimen collection / handling, submission of specimen other  than nasopharyngeal swab, presence of viral mutation(s) within the  areas targeted by this assay, and inadequate number of viral copies  (<250 copies / mL). A negative result must be combined with clinical  observations, patient history, and epidemiological information. If result is POSITIVE SARS-CoV-2 target nucleic acids are DETECTED. The SARS-CoV-2 RNA is generally detectable in upper and lower  respiratory specimens dur ing the acute phase of infection.  Positive  results are indicative of active infection with SARS-CoV-2.  Clinical  correlation with patient history and other diagnostic information is  necessary to  determine patient infection status.  Positive results do  not rule out bacterial infection or co-infection with other viruses. If result is PRESUMPTIVE POSTIVE SARS-CoV-2 nucleic acids MAY BE PRESENT.   A presumptive positive result was obtained on the submitted specimen  and confirmed  on repeat testing.  While 2019 novel coronavirus  (SARS-CoV-2) nucleic acids may be present in the submitted sample  additional confirmatory testing may be necessary for epidemiological  and / or clinical management purposes  to differentiate between  SARS-CoV-2 and other Sarbecovirus currently known to infect humans.  If clinically indicated additional testing with an alternate test  methodology [phone]) is advised. The SARS-CoV-2 RNA is generally  detectable in upper and lower respiratory sp ecimens during the acute  phase of infection. The expected result is Negative. Fact Sheet for Patients:  StrictlyIdeas.no Fact Sheet for Healthcare Providers: BankingDealers.co.za This test is not yet approved or cleared by the [place] FDA and has been authorized for detection and/or diagnosis of SARS-CoV-2 by FDA under an Emergency Use Authorization (EUA).  This EUA will remain in effect (meaning this test can be used) for the duration of the COVID-19 declaration under Section 564(b)(1) of the Act, 21 U.S.C. section 360bbb-3(b)(1), unless the authorization is terminated or revoked sooner. Performed at [facility], [address], [place], [place] [address]   Surgical pcr screen     Status: None   Collection Time: 03/18/19 11:19 PM   Specimen: Nasal Mucosa; Nasal Swab  Result Value Ref Range Status   MRSA, PCR NEGATIVE NEGATIVE Final   Staphylococcus aureus NEGATIVE NEGATIVE Final    Comment: (NOTE) The Xpert SA Assay (FDA approved for NASAL specimens in patients 94 years of age and older), is one component of a comprehensive surveillance program. It is not  intended to diagnose infection nor to guide or monitor treatment. Performed at [facility], [place] [address], [place], [place] [address]      Time coordinating discharge: 34mins  SIGNED:   Kathie Dike, MD  [facility] 03/20/2019, 10:25 AM   If 7PM-7AM, please contact night-coverage [email or web address]

## 2019-03-20 NOTE — Progress Notes (Signed)
Initial Nutrition Assessment  RD working remotely.  DOCUMENTATION CODES:   Not applicable  INTERVENTION:   -Ensure Enlive po BID, each supplement provides 350 kcal and 20 grams of protein -MVI with minerals daily -Downgrade diet to dysphagia 2 (mechanical soft) for ease of intake -Feeding assistance with meals  NUTRITION DIAGNOSIS:   Increased nutrient needs related to chronic illness(COPD, CHF) as evidenced by estimated needs.  GOAL:   Patient will meet greater than or equal to 90% of their needs  MONITOR:   PO intake, Supplement acceptance, Labs, Weight trends, Skin, I & O's  REASON FOR ASSESSMENT:   Low [place]    ASSESSMENT:   [name] is a 85 y.o. female with medical history significant for Parkinson's disease with advanced Alzheimer's dementia, COPD, hypertension, chronic diastolic CHF, anemia, and anxiety who presents to the ED from orthopedics office for evaluation of right femur fracture.  Patient is unable to provide any history due to advanced dementia therefore entirety of history is obtained from EDP, chart review, and daughter by phone.  Pt admitted with distal rt femur fracture.   Reviewed I/O's: +727 ml x 24 hours and +1.1 L since admission  Per orthopedics notes, pt daughter and MD amenable to non-operative management due to age, dementia, and non-ambulatory status.   Pt with advanced dementia and unable to obtain further nutrition-related history at this time.   Noted meal completion 0%. Suspect pt may require feeding assistance due to Parkinson's disease. Per reviewed of wt hx, wt has been stable over the past year.   Pt currently on a soft diet, which is a surgical soft (low fiber, low reside diet) designed for pts with acute GI disease flare-ups or s/p abdominal surgeries. Due to pt's advanced age and dementia, may better benefit from a mechanical soft (dysphagia 2) diet for ease of intake.   Plan to d/c back home with family once medically  stable.  Labs reviewed.   NUTRITION - FOCUSED PHYSICAL EXAM:    Most Recent Value  Orbital Region  Unable to assess  Upper Arm Region  Unable to assess  Thoracic and Lumbar Region  Unable to assess  Buccal Region  Unable to assess  Temple Region  Unable to assess  Clavicle Bone Region  Unable to assess  Clavicle and Acromion Bone Region  Unable to assess  Scapular Bone Region  Unable to assess  Dorsal Hand  Unable to assess  Patellar Region  Unable to assess  Anterior Thigh Region  Unable to assess  Posterior Calf Region  Unable to assess  Edema (RD Assessment)  Unable to assess  Hair  Unable to assess  Eyes  Unable to assess  Mouth  Unable to assess  Skin  Unable to assess  Nails  Unable to assess       Diet Order:   Diet Order            DIET SOFT Room service appropriate? Yes; Fluid consistency: Thin  Diet effective now              EDUCATION NEEDS:   No education needs have been identified at this time  Skin:  Skin Assessment: Reviewed RN Assessment  Last BM:  Unknown  Height:   Ht Readings from Last 1 Encounters:  03/20/19 5\' 1"  (1.549 m)    Weight:   Wt Readings from Last 1 Encounters:  03/20/19 71 kg    Ideal Body Weight:  47.7 kg  BMI:  Body mass  index is 29.58 kg/m.  Estimated Nutritional Needs:   Kcal:  1550-1750  Protein:  70-85 grams  Fluid:  > 1.5 L    Holten Spano A. Jimmye Norman, RD, LDN, [place] Registered Dietitian II Certified Diabetes Care and Education Specialist Pager: [phone] After hours Pager: [phone]

## 2019-03-20 NOTE — Progress Notes (Signed)
CRITICAL VALUE ALERT  Critical Value:  hgb = 6.9  Date & Time Notied:  03/20/19 0543  Provider Notified: K.Schorr, NP  Orders Received/Actions taken: pending

## 2019-03-20 NOTE — Plan of Care (Signed)
  Problem: Pain Managment: Goal: General experience of comfort will improve Outcome: Progressing   Problem: Skin Integrity: Goal: Risk for impaired skin integrity will decrease Outcome: Progressing   

## 2019-03-20 NOTE — Progress Notes (Signed)
Called daughter, Ms. [name] to get a verbal consent for blood transfusion. Daughter upset not being able to see mom and not having enough information. Requesting doctor to give her more update regarding pts condition. Gave verbal consent for only one unit of blood to be transfused today.

## 2019-03-20 NOTE — Consult Note (Signed)
   [facility] CM Inpatient Consult   03/20/2019  [name] [birth year] [id number]    Patient is currently assigned and active with [place] [facility] (nurse practitioner).Patient had been outreached by [place] coordinator for EMMI follow-up calls in the past.  Our community based plan of care has focused on disease management and community resource support.    Chart reviewed and MD notes are as follows: 85 y/o female with history of Parkinson's disease and Alzheimer's dementia, chronic diastolic CHF, HTN and anemia, was admitted to [facility] via [facility] with a right distal femur fracture. She was seen by orthopedics, Dr. Stann Mainland. Her case was discussed with her daughter and since the patient is already non ambulatory at baseline, it was felt to be reasonable to manage her fracture non- operatively with a knee immobilizer. She will follow up with Dr. Stann Mainland in 2 weeks She was also noted to have hypernatremia and mild dehydration. This has corrected with administration of hypotonic fluids.  She also had a significant microcytic anemia. Her baseline hemoglobin seems to run between 9-10. She was admitted with a hemoglobin of 7.4 that required blood transfusion. She has a long history of microcytosis. Family is requesting that patient discharge home today. They feel they have adequate support and equipment at home to appropriately care for her.  Per transition of care CM note reviewed, patient was transitioned to [facility] home health services, and with no barriers identified.  Plan: Update and notify [facility] CM team of patient's disposition for follow-up and will make [facility] team member aware that [place] [facility] is following.  Patient will receive a post hospital call and will be evaluated for assessments and disease process education.   Of note, [facility] Care Management services does not replace or interfere with any services that are needed or arranged by inpatient case management or  social work.    For additional questions or referrals please contact:  Edwena Felty A. Hareem Surowiec, BSN, RN-BC [facility] Liaison Cell: [phone]

## 2019-03-20 NOTE — TOC Transition Note (Signed)
Transition of Care [facility]) - CM/SW Discharge Note   Patient Details  Name: [name] MRN: [id number] Date of Birth: [birth year]  Transition of Care [facility]) CM/SW Contact:  Marilu Favre, RN Phone Number: 03/20/2019, 1:35 PM   Clinical Narrative:     Spoke to patient's daughter [name] via phone. Patient has all needed DME, requesting PTAR home.   PTAR called est time of arrival 1.5 hours. Called Ms [name] back and made aware.  Final next level of care: Home w Home Health Services Barriers to Discharge: No Barriers Identified   Patient Goals and CMS Choice        Discharge Placement                       Discharge Plan and Services                DME Arranged: N/A         HH Arranged: NA          Social Determinants of Health (SDOH) Interventions     Readmission Risk Interventions No flowsheet data found.

## 2019-03-20 NOTE — Progress Notes (Signed)
Spoke with daughter [name] on the phone regarding patient and updated her on her morning, blood transfusion and answered her questions.

## 2019-03-20 NOTE — Progress Notes (Signed)
Patient discharged to home, contacted daughter [name] regarding discharge. Patient's daughter verbalized understanding of all discharge instructions including activity, discharge medications and follow up MD visits. Patient left unit with PTAR at 1420.

## 2019-03-21 LAB — TYPE AND SCREEN
ABO/RH(D): A POS
Antibody Screen: NEGATIVE
Unit division: 0

## 2019-03-21 LAB — BPAM RBC
Blood Product Expiration Date: 202007142359
ISSUE DATE / TIME: 202007020718
Unit Type and Rh: 6200

## 2019-03-24 ENCOUNTER — Encounter: Payer: Self-pay | Admitting: *Deleted

## 2019-03-24 ENCOUNTER — Other Ambulatory Visit: Payer: Self-pay | Admitting: *Deleted

## 2019-03-24 NOTE — Patient Outreach (Signed)
[employer] High Risk pt referred after hospitalization for fall and R distal femur fx.  Primary care office does TOC calls.  [name] is cared for by her daughter, [name]. She has a CAP worker 8 hours a day 5 days a week. She is bed bound now as she has an immobilizer on her leg. She is totally dependent. She has Alzheimer's and is not oriented. She does recognize her daughter, she is cooperative most of the time. She and her daughter reminisce about past times and sometimes she tells stories [name] has never heard of and has verified with other family members. She requires that her medications are liquid or crushed.   Main concern at this time is the potential for skin breakdown and completing a MOST form to post in the home. I would also like to introduce the idea of the addition of palliative care for support.  We will contact [name] weekly to ensure she is receiving the resources and support for her mother's care over the next month.  Medications Reviewed Today    Reviewed by Deloria Lair, NP (Nurse Practitioner) on 03/24/19 at [place] List Status: <None>  Medication Order Taking? Sig Documenting Provider Last Dose Status Informant  acetaminophen (TYLENOL) 500 MG tablet [id number] Yes Take 500 mg by mouth at bedtime. For pain [provider] Taking Active Family Member  ALPRAZolam Duanne Moron) 0.5 MG tablet [id number] Yes Take 0.5 mg by mouth 3 (three) times daily.  [provider] Taking Active Family Member  aspirin EC 81 MG tablet [id number] Yes Take 1 tablet (81 mg total) by mouth daily. Kathie Dike, MD Taking Active Family Member  benztropine (COGENTIN) 1 MG tablet [id number] Yes Take 1 tablet (1 mg total) by mouth daily. Kathie Dike, MD Taking Active Family Member  citalopram (CELEXA) 20 MG tablet [id number] Yes Take 1 tablet (20 mg total) by mouth every morning. Kathie Dike, MD Taking Active Family Member  docusate sodium (COLACE) 100 MG capsule [id number]  Yes Take 1 capsule (100 mg total) by mouth 2 (two) times daily. Kathie Dike, MD Taking Active   ferrous sulfate 325 (65 FE) MG EC tablet [id number] Yes Take 1 tablet (325 mg total) by mouth daily. Kathie Dike, MD Taking Active   furosemide (LASIX) 20 MG tablet [id number] Yes Take 1 tablet (20 mg total) by mouth daily. Isaac Bliss, Rayford Halsted, MD Taking Active Family Member  ipratropium-albuterol (DUONEB) 0.5-2.5 (3) MG/3ML SOLN [id number] Yes Take 3 mLs by nebulization 4 (four) times daily. Barton Dubois, MD Taking Active Family Member  loratadine (CLARITIN) 10 MG tablet [id number] Yes Take 1 tablet (10 mg total) by mouth daily as needed for allergies. Barton Dubois, MD Taking Active Family Member  metoprolol (LOPRESSOR) 50 MG tablet [id number] Yes Take 0.5 tablets (25 mg total) by mouth 2 (two) times daily. Kathie Dike, MD Taking Active Family Member  oxyCODONE (OXY IR/ROXICODONE) 5 MG immediate release tablet [id number] Yes Take 1 tablet (5 mg total) by mouth every 12 (twelve) hours as needed for severe pain. Kathie Dike, MD Taking Active   pantoprazole (PROTONIX) 40 MG tablet [id number] Yes Take 1 tablet (40 mg total) by mouth daily. Kathie Dike, MD Taking Active   rivastigmine (EXELON) 9.5 mg/24hr [id number] Yes Place 1 patch (9.5 mg total) onto the skin daily. *Remove and discard used patchesKathie Dike, MD Taking Active Family Member  zolpidem (AMBIEN) 10 MG tablet [id number] Yes Take 0.5 tablets (5 mg total) by mouth at bedtime as needed  for sleep. Kathie Dike, MD Taking Active          Depression screen [facility] 2/9 03/24/2019 05/06/2018  Decreased Interest 0 1  Down, Depressed, Hopeless 0 1  PHQ - 2 Score 0 2    Fall Risk  03/24/2019  Falls in the past year? 1  Number falls in past yr: 1  Injury with Fall? 1  Risk for fall due to : History of fall(s);Impaired balance/gait;Medication side effect;Impaired mobility  Follow up Falls evaluation completed    [facility] CM Care  Plan Problem One     Most Recent Value  Care Plan Problem One  Pt is bed bound with limited moibility and now has an immobilizer on R leg.  Role Documenting the Problem One  Care Management Assistant  Care Plan for Problem One  Active  THN Long Term Goal   Pt skin will remain intact over the next 90 days.  THN Long Term Goal Start Date  03/24/19  Interventions for Problem One Long Term Goal  Discuss skin care, turning schedule, how to identify pressure lesions, removing immobilizer often to prevent pressure areas or friction areas.  THN CM Short Term Goal #1   Daughter will report the skin breakdown prevention measures she performs on a daily basis within the next 30 days.  THN CM Short Term Goal #1 Start Date  03/24/19  Interventions for Short Term Goal #1  Explore skin current condition. Explore daily regimen. Explore turning schedule.    THN CM Care Plan Problem Two     Most Recent Value  Care Plan Problem Two  Pt is a DNR but does not have a MOST form in the home.  Role Documenting the Problem Two  Care Management Assistant  Care Plan for Problem Two  Active  THN CM Short Term Goal #1   Daughter will complete a MOST form and post in their home within the next 30 days.  THN CM Short Term Goal #1 Start Date  03/24/19  Interventions for Short Term Goal #2   Interventions for ST!: Will provide a MOST form and discuss the need and value of this form vs DNR form.  THN CM Short Term Goal #2   Daughter will be counseled on Palliative Care during the next 30 days.  THN CM Short Term Goal #2 Start Date  03/24/19      [name]. [name], MSN, [facility] Gerontological Nurse Practitioner [facility] Care Management [phone]

## 2019-04-01 ENCOUNTER — Other Ambulatory Visit: Payer: Self-pay | Admitting: *Deleted

## 2019-04-01 NOTE — Patient Outreach (Signed)
Outreach call to patient's daughter [name] for follow up, Primary care MD completes transition of care, per daughter [name] pt is doing well, she is sitting up in the chair, getting out of bed daily and changing positions, [name] states "the problem I have is we're not sure how this leg brace is supposed to go and it's caused a bruise on her leg"  [name] states when pt came home via EMS she is not sure the brace was on correctly and there has been no one to demonstrate how to wear the brace. [name] states pt has no skin breakdown.  She states pt only seems to have pain at times with movement but otherwise does not seem to be in pain.  [name] states she did not receive the MOST form but will be looking for it.  Pt to see surgeon next Monday 04/07/19.  THN CM Care Plan Problem One     Most Recent Value  Care Plan Problem One  Pt is bed bound with limited moibility and now has an immobilizer on R leg.  Role Documenting the Problem One  Care Management Assistant  Care Plan for Problem One  Active  THN Long Term Goal   Pt skin will remain intact over the next 90 days.  THN Long Term Goal Start Date  03/24/19  Interventions for Problem One Long Term Goal  RN CM reviewed importance of maintaining skin integrity and ways to prevent skin breakdown such as changing positions q 2 hours, adequate protein in diet, being mindful of anything that would cause pressure such as the brace.  RN CM ask daughter to call surgeon (pt will see on 7/20) and describe the brace and ask about exactly how it is to be worn, daughter states she will do this today  THN CM Short Term Goal #1   Daughter will report the skin breakdown prevention measures she performs on a daily basis within the next 30 days.  THN CM Short Term Goal #1 Start Date  03/24/19  Interventions for Short Term Goal #1  RN CM ask daughter to inspect patient's skin daily, keep skin moisturized and report any redness, open areas to doctor    [facility] CM Care Plan  Problem Two     Most Recent Value  Care Plan Problem Two  Pt is a DNR but does not have a MOST form in the home.  Role Documenting the Problem Two  Care Management Assistant  Care Plan for Problem Two  Active  THN CM Short Term Goal #1   Daughter will complete a MOST form and post in their home within the next 30 days.  THN CM Short Term Goal #1 Start Date  03/24/19  Interventions for Short Term Goal #2   RN CM did discuss MOST form and daughter has not received in the mail yet  THN CM Short Term Goal #2   Daughter will be counseled on Palliative Care during the next 30 days.  THN CM Short Term Goal #2 Start Date  03/24/19  Interventions for Short Term Goal #2  RN CM did discuss palliative care, daughter is not sure about this option and will think about this      PLAN Continue weekly outreach for support, resources  Jacqlyn Larsen [facility], [place] Coordinator [phone]

## 2019-04-08 ENCOUNTER — Other Ambulatory Visit: Payer: Self-pay | Admitting: *Deleted

## 2019-04-08 DIAGNOSIS — S7291XD Unspecified fracture of right femur, subsequent encounter for closed fracture with routine healing: Secondary | ICD-10-CM

## 2019-04-08 DIAGNOSIS — J449 Chronic obstructive pulmonary disease, unspecified: Secondary | ICD-10-CM | POA: Diagnosis not present

## 2019-04-08 NOTE — Patient Outreach (Signed)
Outreach call to patient's daughter [name], HIPAA verified, [name] states pt did not go to orthopedic appointment on 04/07/19 due to unable to get pt out of the house and down the front steps (3-4 steps), [name] has tried to get a wheelchair ramp and has had issues with this related to a permit, has had issues with transportation due to needs doctor order due to Covid-19 stating pt is safe transport, pt is on CAP program and they have tried to assist with this transportation issue but nothing has been resolved.  [name] states she called [facility] and was on hold for over 2 hours to talk to them about her mother's appointment and was unable to speak with anyone and no one called her back, [name] is receptive to a telephone visit for assessment, support for her mother,  [name] states "brace fits better, I redid it and repositioned but she still has a bruise"  [name] states pt has no skin breakdown, no redness.  RN CM called [facility] and was on hold for almost 15 minutes and unable to speak with anyone and unable to leave a message,  RN CM faxed letter to [facility] reporting patient's daughter requests a telephone visit from MD and has been unable to get through.  No other concerns voiced today.  RN CM placed order for [facility] social worker.  PLAN Follow up by telephone next week with daughter Ask if telephone visit scheduled by MD Reinforce precautions to maintain skin integrity  Jacqlyn Larsen [facility], [place] Coordinator [phone]

## 2019-04-10 ENCOUNTER — Other Ambulatory Visit: Payer: Self-pay

## 2019-04-10 NOTE — Patient Outreach (Signed)
[place] [facility]) Care Management  04/10/2019  [name] [birth year] [id number]   Social work referral received from [employer], Jacqlyn Larsen.   "Order for social work, Please speak with daughter [name] - best number [phone], pt lives in [place] with her mother. Transportation issues- pt is unable to get out of the house due to no WC ramp, daughter has tried to get a ramp and there is an issue with permit. Pt cannot get down the front steps (3-4 steps) to be transported. [name] also cannot get transport due to Covid- says doctor has to order this and say pt is a safe transport, pt has CAP and [name] states they have tried to help. Pt missed her 7/20 ortho appt. I sent letter requesting a telephone visit as we can't reach them, dtr was on hold 2 hours and I was unable to reach them. Any resources or suggestions for the WC ramp, transportation is appreciated." Unsuccessful outreach today.  Voicemail box not set up on home or mobile numbers so unable to leave message.  Unsuccessful outreach letter mailed.  Will attempt to reach again within four business days.  [name], BSW Social Worker [phone]

## 2019-04-14 ENCOUNTER — Other Ambulatory Visit: Payer: Self-pay

## 2019-04-14 NOTE — Patient Outreach (Addendum)
[place] [facility]) Care Management  04/14/2019  CONNA TERADA 07-04-1920 [id number]   Successful outreach to patient regarding social work referral for assistance with ramp. BSW and daughter discussed referral process/services with [employer] as well as Independent Living.   BSW and daughter conducted three way call to [facility]. [place] [facility] representative further discussed process with daughter and referral was submitted.  Daughter will receive paperwork that needs to be signed by owner of the home(daughter reports that it's rental property) and sent back to [facility]. Daughter consented to referral being submitted to [place] as well.  BSW messaged [name] at [employer] requesting contact information for [facility] caseworker.   BSW will submit referral when able and follow up with daughter.  Addendum: [name] is Independent Living Caseworker for [facility].  Referral for ramp submitted.  Contacted daughter to inform her of this and to let her know she will be contacted by [place].  Will follow up within the next two weeks.    [name], BSW Social Worker [phone]

## 2019-04-15 ENCOUNTER — Other Ambulatory Visit: Payer: Self-pay

## 2019-04-15 NOTE — Patient Outreach (Signed)
[place] [facility]) Care Management  04/15/2019  [name] 1920/01/01 [id number]   Contacted [name] in the absence of her assigned care manager. Member was recently hospitalized for a right femur fracture.  Successful outreach with [name]'s daughter, [name]. HIPAA identifiers verified. [name] reports that [name] has not complained of discomfort and appears to be feeling well.   [name] expressed concerns regarding [name]'s knee immobilizer. Reports the device seems to have shifted. Also reports a wound due to the device rubbing against the right leg. She describes it as a friction wound. Denies bleeding or purulent drainage to the site. Reports placing towels in the immobilizer to prevent further damage. As of today, the wound nor the device have been evaluated by a provider. [name] reports that she has not spoken with the Primary Care or Ortho staff since [name] was discharged. Also reports that home health services were not established. States that [name] was unable to attend the 2-week follow-up appointments due to lack of appropriate transportation.    Contacted [facility] and confirmed appointment for 04/24/19. Contacted her primary care provider, Dr. Cathey Endow office. Pending a return call.  PLAN -Will contact [place] SW regarding need for transportation assistance.   [place] [phone]

## 2019-04-21 ENCOUNTER — Other Ambulatory Visit: Payer: Self-pay

## 2019-04-21 NOTE — Patient Outreach (Signed)
[place] [facility]) Care Management  04/21/2019  KATARYNA MCQUILKIN [birth year] [id number]   The following in-basket message was received from [facility], Felecia McCray on 04/15/19.   "Ms. Rudden has a pending post hospitalization follow-up with Dr. Waneta Martins) and will need transportation assistance. The appointment info is as follows:   Location: [facility] ([place])  Date:    April 24, 2019 at 2pm  She is nonambulatory and requires a knee immobilizer. She will likely require non-emergent convalescent transport. Per chart review, PTAR provided transportation at discharge.   Her daughter, [name] is the point of contact. She can be reached at [phone]."  BSW has communicated with [facility] Assistant Clinical Director, Bary Castilla as well as [facility] Consultant, Gerlene Fee about this request. It was recommended that patient's daughter contact [facility] Provider Benefit number regarding coverage of PTAR transportation.   BSW contacted daughter last week to inform her of this.  Per daughter, she has already contacted [facility] Provider Benefits and was told that an MD order would be needed. Daughter stated that they would not provide information regarding cost for this service, if any, if order is written.  Daughter stated several times that they cannot afford any out-of-pocket cost for PTAR transportation. I informed daughter that I will not be arranging transportation because we have not been able to determine coverage/cost for service.  [name], BSW Social Worker [phone]

## 2019-04-25 ENCOUNTER — Other Ambulatory Visit: Payer: Self-pay | Admitting: *Deleted

## 2019-04-25 NOTE — Patient Outreach (Signed)
Telephone assessment, called and spoke briefly with caregiver, daughter was not there. Left message to request Ms. [name] return my call to follow up on her mother's needs.   Did not hear back from [name]. I will call again on Monday, 04/28/19.  [name]. [name], MSN, [facility] Gerontological Nurse Practitioner [facility] Care Management [phone]

## 2019-04-28 ENCOUNTER — Other Ambulatory Visit: Payer: Self-pay

## 2019-04-28 ENCOUNTER — Ambulatory Visit: Payer: Self-pay

## 2019-04-28 ENCOUNTER — Other Ambulatory Visit: Payer: Self-pay | Admitting: *Deleted

## 2019-04-28 NOTE — Patient Outreach (Signed)
[place] [facility]) Care Management  04/28/2019  [name] 27-Oct-1919 [id number]   Secure message sent to [name] with Independent Living regarding status of referral for ramp.  BSW will follow up with patient's daughter once response is received.   [name], BSW Social Worker [phone]

## 2019-04-28 NOTE — Patient Outreach (Signed)
Telephone outreach. Talked with [name], Mrs. Zellner's daughter and HCPOA. Mrs. [name] has not been able to communicate with Dr. Stann Mainland office although she has tried. Pt has not seen a doctor since she was discharged on 03/20/19. They do not have a ramp at their home and are not able to pay for ambulance transportation for an MD visit. There is a pending consultation for a ramp to be built.  Requested if they would allow be to come and visit and then report to the orthopedist pt status. Mrs. [name] agrees to a home visit following COPVID19 precautions. I will see them tomorrow at 2:00 pm.  Will also consult with Amber Chrismon regarding transportation services.  [name]. [name], MSN, [facility] Gerontological Nurse Practitioner [facility] Care Management [phone]

## 2019-04-29 ENCOUNTER — Other Ambulatory Visit: Payer: Self-pay | Admitting: *Deleted

## 2019-04-29 ENCOUNTER — Other Ambulatory Visit: Payer: Self-pay

## 2019-04-29 NOTE — Patient Outreach (Signed)
[place] [facility]) Care Management   04/29/2019  [name] 1920/03/25 [id number]  [name] is an 85 y.o. female  Subjective: Acute home visit after verifying that [name] has not been seen for her orthopedic follow up due to inability to afford ambulance transport. She has neither seen her primary care provider. Her daughter, Ms. [name], has attempted to communicate with the orthopedic office without reported success. Home health was not ordered due to [name]'s nonambulatory status and dementia, lack of potential for goal achievement. Permission granted to visit [name] at home.  [name] is sitting in a chair with her legs elevated on a stool. She does not open her eyes when spoken to, she is sleeping. Identified myself and the reason I am there to evaluate her.  [name] CAP caregiver is also present for the evaluation, [name].  Hampton reports [name] is comfortable when seated leg elevated and in bed. She grumbles with perineal care and transfers at times.  Objective:   Review of Systems  Constitutional: Negative.   HENT: Negative.   Eyes: Negative.   Respiratory: Negative.   Cardiovascular: Negative.        Regular rhythm, murmur.  Gastrointestinal: Negative.   Genitourinary:       Iincontinent.  Musculoskeletal:       R leg in mid thigh to mid calf soft immobilizer. There is asymetrical differences between her legs. R leg is shorter than left from hip to knee. R thigh proximal to knee is larger than L leg at same place. [name] also has lymphedema of her left arm. Her Hampton reports that this is new since she was in the hospital and had an IV in her R arm which is same side as previous mastectomy.    Skin: Negative.   Neurological: Positive for weakness.       LE reflexes diminished but present. Sleeps though my exam with occasional mumbles.  Endo/Heme/Allergies: Negative.   Psychiatric/Behavioral: Negative.     Physical Exam  Constitutional: She appears well-developed and  well-nourished.  Cardiovascular: Normal rate.  Murmur heard. Respiratory: Effort normal and breath sounds normal.  GI: Soft. Bowel sounds are normal.  Musculoskeletal:        General: Deformity and edema present.     Comments: Edema in R upper thigh superior to knee. Lymphedema arm from axilla to hand. R leg is slightly externally rotated. I am able to straighten to midline but meet resistance to attempt passive internal rotation. [name] does begin to grimace with this motion.  Neurological:  Sleepy, mumbles occasionally which in unintelligible. R leg reflexes are reactive but diminished.  Skin: Skin is warm and dry.  Good cap refil in her R toes. Skin is warm and pink. No breakdown.   Patient was recently discharged from hospital and all medications have been reviewed.  Encounter Medications:   Outpatient Encounter Medications as of 04/29/2019  Medication Sig  . acetaminophen (TYLENOL) 500 MG tablet Take 500 mg by mouth at bedtime. For pain  . ALPRAZolam (XANAX) 0.5 MG tablet Take 0.5 mg by mouth 3 (three) times daily.   Marland Kitchen aspirin EC 81 MG tablet Take 1 tablet (81 mg total) by mouth daily.  . benztropine (COGENTIN) 1 MG tablet Take 1 tablet (1 mg total) by mouth daily.  . citalopram (CELEXA) 20 MG tablet Take 1 tablet (20 mg total) by mouth every morning.  . docusate sodium (COLACE) 100 MG capsule Take 1 capsule (100 mg total) by mouth 2 (two)  times daily.  . ferrous sulfate 325 (65 FE) MG EC tablet Take 1 tablet (325 mg total) by mouth daily.  . furosemide (LASIX) 20 MG tablet Take 1 tablet (20 mg total) by mouth daily.  Marland Kitchen ipratropium-albuterol (DUONEB) 0.5-2.5 (3) MG/3ML SOLN Take 3 mLs by nebulization 4 (four) times daily.  Marland Kitchen loratadine (CLARITIN) 10 MG tablet Take 1 tablet (10 mg total) by mouth daily as needed for allergies.  . metoprolol (LOPRESSOR) 50 MG tablet Take 0.5 tablets (25 mg total) by mouth 2 (two) times daily.  Marland Kitchen oxyCODONE (OXY IR/ROXICODONE) 5 MG immediate release tablet  Take 1 tablet (5 mg total) by mouth every 12 (twelve) hours as needed for severe pain.  . pantoprazole (PROTONIX) 40 MG tablet Take 1 tablet (40 mg total) by mouth daily.  . rivastigmine (EXELON) 9.5 mg/24hr Place 1 patch (9.5 mg total) onto the skin daily. *Remove and discard used patches*  . zolpidem (AMBIEN) 10 MG tablet Take 0.5 tablets (5 mg total) by mouth at bedtime as needed for sleep.   No facility-administered encounter medications on file as of 04/29/2019.    Functional status - totally dependent  Fall/Depression Screening:    Fall Risk  03/24/2019  Falls in the past year? 1  Number falls in past yr: 1  Injury with Fall? 1  Risk for fall due to : History of fall(s);Impaired balance/gait;Medication side effect;Impaired mobility  Follow up Falls evaluation completed   PHQ 2/9 Scores 03/24/2019 03/24/2019 05/06/2018  PHQ - 2 Score 0 0 2   Functional Status Survey: Is the patient deaf or have difficulty hearing?: No Does the patient have difficulty seeing, even when wearing glasses/contacts?: Yes Does the patient have difficulty concentrating, remembering, or making decisions?: Yes Does the patient have difficulty walking or climbing stairs?: Yes(Unable to do - bed or chair bound.) Does the patient have difficulty dressing or bathing?: Yes Does the patient have difficulty doing errands alone such as visiting a doctor's office or shopping?: Yes    THN CM Care Plan Problem One     Most Recent Value  Care Plan Problem One  No MD visit since hospital discharge for follow up of displaced fx. [name] says they cannot affford the co pay of PTAR. It has been impossible to talk with anyone at the orthopedic office.  Role Documenting the Problem One  Care Management Coordinator  Care Plan for Problem One  Active  THN CM Short Term Goal #1   [name] will receive an inhome mobile XRay of her R femur. Dr. Berdine Addison to review and advise NP of any recommendations.  THN CM Short Term Goal #1 Start Date   04/29/19  Interventions for Short Term Goal #1  Not able to get out of the home. [name] is bed or chair bound with a leg immobilizer. Request in home mobile XRay.    THN CM Care Plan Problem Two     Most Recent Value  Care Plan Problem Two  [name] is approaching end of life gradually, not acutley.  Role Documenting the Problem Two  Care Management Coordinator  Care Plan for Problem Two  Active  Interventions for Problem Two Long Term Goal   Discussed appropriate referral for PC for support and easy transfer to Hospice care at emminent signs of decline. [name] may stay at home with these services which is what Hampton desires.  THN Long Term Goal  Hampton will accept an in home palliative care consult to learn of the servies  and support in the next 45 days..  THN Long Term Goal Start Date  04/29/19  [facility] CM Short Term Goal #1   Hampton to complete a MOST form by the end of 30 days.  THN CM Short Term Goal #1 Start Date  04/29/19  Interventions for Short Term Goal #2   Provided a MOST form and explained the benefits beyond having a DNR/       Assessment: 1) R displaced distal femur.                        2) Approaching end of life  Plan: Request mobile Xray of R femur from Dr. Berdine Addison. Will follow up as he deems necessary. Will continue to follow telephonically.  Recommend Dr. Berdine Addison to discuss with Ms. Hampton and order Palliative Consult. I did discuss this with [name]. She initially was resistant. She does not want her mother to go anywhere. I reassured her that she would not need to. PC can transition to Hospice in home if her mother's condition changes. Recommended for extra support. [facility] and [facility] do serve their area.  I will call [name]/Hampton next week to follow up.  [name]. [name], MSN, [facility] Gerontological Nurse Practitioner [facility] Care Management [phone]

## 2019-05-06 ENCOUNTER — Other Ambulatory Visit: Payer: Self-pay

## 2019-05-06 ENCOUNTER — Other Ambulatory Visit: Payer: Self-pay | Admitting: *Deleted

## 2019-05-06 NOTE — Patient Outreach (Signed)
Telephone call to follow up from last week's home visit and plan of care.  [name]. [name], MSN, [facility] Gerontological Nurse Practitioner [facility] Care Management [phone]

## 2019-05-07 NOTE — Patient Outreach (Signed)
05/07/19 Did not receive a return call from [name], pt's daughter for update. Called again today, and left a message with [name], pt's caregiver to have daughter call me back at her convenience. [name] reports pt has not had the mobile X-Ray to re-evaluate her femur fx or call from her primary doctor. I have called Dr. Cathey Endow office to ensure the office received my note from last week. Was able to verify the note was received but unable to determine if Dr. Berdine Addison saw the note on Monday or Tuesday this week when he was in the office. Asked nurse to please ask supervising nurse if she can assist me in finding out if Dr. Berdine Addison saw my note and made a decision about the mobile Xray.  Will pend this note to wait on return calls from daughter and primary care office.  [name]. [name], MSN, GNP-BC Gerontological Nurse Practitioner [facility] Care Management [phone]  Did not receive return call from [name] today.CCS

## 2019-05-08 DIAGNOSIS — M25561 Pain in right knee: Secondary | ICD-10-CM | POA: Diagnosis not present

## 2019-05-09 ENCOUNTER — Other Ambulatory Visit: Payer: Self-pay | Admitting: *Deleted

## 2019-05-09 DIAGNOSIS — J449 Chronic obstructive pulmonary disease, unspecified: Secondary | ICD-10-CM | POA: Diagnosis not present

## 2019-05-09 NOTE — Patient Outreach (Signed)
Today, I was able to talk with [name], Mrs. Gockel's daughter. She reports her mother is doing fine and is comfortable. She reports that she had an in home X-Ray yesterday. She has not heard about any results. She reports she has not talked to anyone at Dr. Cathey Endow office.  Advised I had talked with the office and verified my note regarding last week's visit was in the chart. I left a message for the nurse supervisor to please see if she could get Dr. Berdine Addison or an associate to address my requests for an in home X-Ray and a palliative care consult. I did not receive a response but the pt did get the X-Ray.  Discussed palliative care again with [name] for support and if her mother's condition does decline they can transition her care to Hospice. [name] states today she is not interested in the service. She feels her mother is fine now with her care and the CAP care giver.  Advised I will call next week and encouraged her to call me if she has any questions.  [name]. [name], MSN, [facility] Gerontological Nurse Practitioner [facility] Care Management [phone]

## 2019-05-16 ENCOUNTER — Ambulatory Visit: Payer: Self-pay | Admitting: *Deleted

## 2019-05-19 ENCOUNTER — Other Ambulatory Visit: Payer: Self-pay

## 2019-05-19 NOTE — Patient Outreach (Signed)
[facility]) Care Management  05/19/2019  [name] 19-Apr-1920 [id number]   Contacted patient's daughter regarding status of referrals to Independent Living and [place] [facility] for ramp.  Spoke briefly but she asked for call back later.  Attempted to call back but she was not home and did not answer mobile number.  Will attempt to reach again within four business days.  [name], BSW Social Worker [phone]

## 2019-05-21 ENCOUNTER — Other Ambulatory Visit: Payer: Self-pay

## 2019-05-21 NOTE — Patient Outreach (Signed)
[facility]) Care Management  05/21/2019  [name] 06/23/1920 [id number]   Successful follow up call to patient's daughter regarding referrals to [facility] and Independent Living for ramp.   Daughter reports that she completed the paperwork from [facility] and sent it back. She communicated with [name] from [place] on 05/14/19 and is awaiting paperwork from her.   Will follow up again next month regarding status of referrals.  [name], BSW Social Worker [phone]

## 2019-05-28 ENCOUNTER — Other Ambulatory Visit: Payer: Self-pay | Admitting: *Deleted

## 2019-05-28 NOTE — Patient Outreach (Addendum)
Telephone assessment:  Called Dr. Cathey Endow office to get results of portable XRay of pt R knee/femur: healing displaced distal femur.  Talked to Dr. Berdine Addison. He will ensure the orthopedist that initially evaluated [name] receives a copy of the report since she was not able to come to a follow up appt.  Called pt home, left message to return my call for XRay result and conversation with Dr. Berdine Addison.  [name] returned my call 05/28/10. She reports her mother is very well. She is up in her chair and having a conversation today. [name] says she no longer complains of leg pain when she is transferred or receiving personal care. [name] says the immobilizer does cause some bruising and chaffing of her skin and asks if it can be discontinued.  Called Dr. Berdine Addison again regarding this and he refers me to talk to the orthopedist that saw her in the hospital about discontinuing the immobilizer.  I will do this tomorrow 05/30/19.   For further contacts I will call monthly now and have encouraged Ms. [name] to call me if there are any needs between calls.  [name]. Samantha Neither, MSN, [facility] Gerontological Nurse Practitioner [facility] Care Management [phone]

## 2019-06-09 DIAGNOSIS — J449 Chronic obstructive pulmonary disease, unspecified: Secondary | ICD-10-CM | POA: Diagnosis not present

## 2019-06-18 NOTE — Patient Outreach (Signed)
Late entry from 05/29/19 : Dr. Stann Mainland, orthopedist, replied that the immobilizer could be removed. Notified pt's daughter.  [name]. [name], MSN, [facility] Gerontological Nurse Practitioner [facility] Care Management [phone]

## 2019-06-27 ENCOUNTER — Other Ambulatory Visit: Payer: Self-pay

## 2019-06-27 ENCOUNTER — Other Ambulatory Visit: Payer: Self-pay | Admitting: *Deleted

## 2019-06-27 NOTE — Patient Outreach (Signed)
Telephone outreach. Spoke to [name] pt's caregiver today. She reports Ms. [name] is doing very well. No problems with her RLE since her immobilzer has been discontinued. Caregiver reports pt may start getting some PT. Pt is eating well. No signs of pain. No new issues to report.  [name] is working with daughter, provided resources for transportation and possible building of a ramp so pt can go out of the house for MD appts.  Caregiver reports, she herself is doing well and that Ms. [name], pt daughter is also well.  I will call back again in one month.  THN CM Care Plan Problem One     Most Recent Value  Care Plan Problem One  No MD visit since hospital discharge for follow up of displaced fx. Pt daughter says they cannot affford the co pay of PTAR. It has been impossible to talk with anyone at the orthopedic office.  (Pended)   Role Documenting the Problem One  Care Management Coordinator  (Pended)   Care Plan for Problem One  Active  (Pended)   THN Long Term Goal   Daughter will reach out to NP for any medical needs to prevent complications and provide early intervention over the next 90 days.  (Pended)   THN Long Term Goal Start Date  05/30/19  (Pended)     [facility] CM Care Plan Problem Two     Most Recent Value  Care Plan Problem Two  Pt is approaching end of life gradually, not acutley.  (Pended)   Role Documenting the Problem Two  Care Management Coordinator  (Pended)   Care Plan for Problem Two  Active  (Pended)   THN Long Term Goal  Pt will receive in home assessment over the next 90 days per daughter request for evaluation.  (Pended)   THN Long Term Goal Start Date  05/30/19  (Pended)     [facility] CM Care Plan Problem Three     Most Recent Value  Care Plan Problem Three  High risk for injury related to being bed or chair bound.  (Pended)   Role Documenting the Problem Three  Care Management Coordinator  (Pended)   Care Plan for Problem Three  Active  (Pended)   THN Long Term Goal    Pt will not sustain serious injury over the next 90 days.  (Pended)   THN Long Term Goal Start Date  05/30/19  (Pended)      [name]. [name], MSN, [facility] Gerontological Nurse Practitioner [facility] Care Management [phone]

## 2019-06-30 ENCOUNTER — Ambulatory Visit: Payer: Self-pay

## 2019-06-30 ENCOUNTER — Other Ambulatory Visit: Payer: Self-pay

## 2019-06-30 NOTE — Patient Outreach (Signed)
[place] [facility]) Care Management  06/30/2019  [name] 04/29/1920 [id number]   Contacted [name] with Independent [facility] regarding status of referral for assistance with ramp.  She just received requested medical records and is processing them at this time.  Will follow up again next month regarding status of referral.  [name], [place] Worker [phone]

## 2019-07-08 DIAGNOSIS — M159 Polyosteoarthritis, unspecified: Secondary | ICD-10-CM | POA: Diagnosis not present

## 2019-07-08 DIAGNOSIS — I972 Postmastectomy lymphedema syndrome: Secondary | ICD-10-CM | POA: Diagnosis not present

## 2019-07-08 DIAGNOSIS — D649 Anemia, unspecified: Secondary | ICD-10-CM | POA: Diagnosis not present

## 2019-07-08 DIAGNOSIS — Z9981 Dependence on supplemental oxygen: Secondary | ICD-10-CM | POA: Diagnosis not present

## 2019-07-09 DIAGNOSIS — J449 Chronic obstructive pulmonary disease, unspecified: Secondary | ICD-10-CM | POA: Diagnosis not present

## 2019-07-31 ENCOUNTER — Other Ambulatory Visit: Payer: Self-pay

## 2019-07-31 NOTE — Patient Outreach (Signed)
[place] [facility]) Care Management  07/31/2019  TIYANNA ALERS [birth year] [id number]   Per [name] with Independent Living, Patient does not qualify for ramp assistance because "her dementia is too severe."  Contacted [place] [facility] for update on request for assistance through their agency.  Property has been evaluated and they intend to assist, however, they are having difficulty locating volunteers at this time of [place].  Per [name] with [place] [facility], the last communication from the church pastor was on 07/15/19.  [name] said that she would contact him to determine if there has been any development since last communication.  If so, she will contact patient's daughter.  Called daughter today and provided the above information.  Will follow up with [place] [facility] and daughter again next month.  [name], BSW Social Worker [phone]

## 2019-08-01 ENCOUNTER — Other Ambulatory Visit: Payer: Self-pay | Admitting: *Deleted

## 2019-08-01 ENCOUNTER — Encounter: Payer: Self-pay | Admitting: *Deleted

## 2019-08-01 NOTE — Patient Outreach (Signed)
Telephone outreach and case closure for nurse care Freight forwarder.  Talked with pt caregiver today, [name]. She reports Mrs. Rasch is doing well. She has not had any problems since her tooth abscess last month. She continues to interact with the family. She is eating well. No physical problems.  Note that [employer], BSW, continues to work with family to advocate for them to have a ramp built.  NP will close case at this time. I have reinforced to the caregiver, that I will continue to be available if they need medical assessment at home.  THN CM Care Plan Problem One     Most Recent Value  Care Plan Problem One  No MD visit since hospital discharge for follow up of displaced fx. Pt daughter says they cannot affford the co pay of PTAR. It has been impossible to talk with anyone at the orthopedic office.  (Pended)   Role Documenting the Problem One  Care Management Coordinator  (Pended)   Care Plan for Problem One  Active  (Pended)   THN Long Term Goal   Daughter will reach out to NP for any medical needs to prevent complications and provide early intervention over the next 90 days.  (Pended)   THN Long Term Goal Start Date  05/30/19  (Pended)     [facility] CM Care Plan Problem Two     Most Recent Value  Care Plan Problem Two  Pt is approaching end of life gradually, not acutley.  (Pended)   Role Documenting the Problem Two  Care Management Coordinator  (Pended)   Care Plan for Problem Two  Active  (Pended)   THN Long Term Goal  Pt will receive in home assessment over the next 90 days per daughter request for evaluation.  (Pended)   THN Long Term Goal Start Date  05/30/19  (Pended)     [facility] CM Care Plan Problem Three     Most Recent Value  Care Plan Problem Three  High risk for injury related to being bed or chair bound.  (Pended)   Role Documenting the Problem Three  Care Management Coordinator  (Pended)   Care Plan for Problem Three  Active  (Pended)   THN Long Term Goal   Pt will not sustain  serious injury over the next 90 days.  (Pended)   THN Long Term Goal Start Date  05/30/19  (Pended)      [name]. [name], MSN, [facility] Gerontological Nurse Practitioner [facility] Care Management [phone]

## 2019-08-09 DIAGNOSIS — J449 Chronic obstructive pulmonary disease, unspecified: Secondary | ICD-10-CM | POA: Diagnosis not present

## 2019-09-05 ENCOUNTER — Other Ambulatory Visit: Payer: Self-pay

## 2019-09-05 NOTE — Patient Outreach (Signed)
[facility]) Care Management  09/05/2019  [name] 04/11/1920 [id number]   Contacted [name] with [place] for update on status of referral for ramp.  [name] has been in close communication with a pastor in the [facility] area who is willing to help, however, he is still attempting to locate other volunteers that can assist him.  [name] stated that volunteers are more difficult to locate during holiday season and during this time of [place].  Will follow up again within the next two months.   [name], BSW Social Worker [phone]

## 2019-09-08 DIAGNOSIS — J449 Chronic obstructive pulmonary disease, unspecified: Secondary | ICD-10-CM | POA: Diagnosis not present

## 2019-10-09 DIAGNOSIS — J449 Chronic obstructive pulmonary disease, unspecified: Secondary | ICD-10-CM | POA: Diagnosis not present

## 2019-11-06 ENCOUNTER — Other Ambulatory Visit: Payer: Self-pay

## 2019-11-06 ENCOUNTER — Ambulatory Visit: Payer: Medicare Other | Admitting: *Deleted

## 2019-11-06 NOTE — Patient Outreach (Signed)
[facility]) Care Management  11/06/2019  [name] Jun 14, 1920 [id number]   Contacted [place] [facility] for update on referral for ramp assistance. Volunteers have been located and now [facility] is working to Chief Strategy Officer. Daughter will be contacted when they are able to proceed with construction.   Contacted patient's daughter to provide update.  Closing Social Work case at this time but ensured that daughter has contact information for [facility] if needed.  [name], BSW Social Worker [phone]

## 2019-11-09 DIAGNOSIS — J449 Chronic obstructive pulmonary disease, unspecified: Secondary | ICD-10-CM | POA: Diagnosis not present

## 2019-12-07 DIAGNOSIS — J449 Chronic obstructive pulmonary disease, unspecified: Secondary | ICD-10-CM | POA: Diagnosis not present

## 2020-01-07 DIAGNOSIS — J449 Chronic obstructive pulmonary disease, unspecified: Secondary | ICD-10-CM | POA: Diagnosis not present

## 2020-02-06 DIAGNOSIS — J449 Chronic obstructive pulmonary disease, unspecified: Secondary | ICD-10-CM | POA: Diagnosis not present

## 2020-03-08 DIAGNOSIS — J449 Chronic obstructive pulmonary disease, unspecified: Secondary | ICD-10-CM | POA: Diagnosis not present

## 2020-04-07 DIAGNOSIS — J449 Chronic obstructive pulmonary disease, unspecified: Secondary | ICD-10-CM | POA: Diagnosis not present

## 2020-05-08 DIAGNOSIS — J449 Chronic obstructive pulmonary disease, unspecified: Secondary | ICD-10-CM | POA: Diagnosis not present

## 2020-06-01 DIAGNOSIS — I972 Postmastectomy lymphedema syndrome: Secondary | ICD-10-CM | POA: Diagnosis not present

## 2020-06-08 DIAGNOSIS — J449 Chronic obstructive pulmonary disease, unspecified: Secondary | ICD-10-CM | POA: Diagnosis not present

## 2020-07-08 DIAGNOSIS — J449 Chronic obstructive pulmonary disease, unspecified: Secondary | ICD-10-CM | POA: Diagnosis not present

## 2020-08-08 DIAGNOSIS — J449 Chronic obstructive pulmonary disease, unspecified: Secondary | ICD-10-CM | POA: Diagnosis not present

## 2020-08-16 ENCOUNTER — Emergency Department (HOSPITAL_COMMUNITY): Payer: Medicare Other

## 2020-08-16 ENCOUNTER — Inpatient Hospital Stay (HOSPITAL_COMMUNITY)
Admission: EM | Admit: 2020-08-16 | Discharge: 2020-08-19 | DRG: 177 | Disposition: A | Discharge status: Home / Self Care | Payer: Medicare Other | Attending: Internal Medicine | Admitting: Internal Medicine

## 2020-08-16 ENCOUNTER — Other Ambulatory Visit: Payer: Self-pay

## 2020-08-16 DIAGNOSIS — G20A1 Parkinson's disease without dyskinesia, without mention of fluctuations: Secondary | ICD-10-CM | POA: Diagnosis present

## 2020-08-16 DIAGNOSIS — J690 Pneumonitis due to inhalation of food and vomit: Principal | ICD-10-CM | POA: Diagnosis present

## 2020-08-16 DIAGNOSIS — I5032 Chronic diastolic (congestive) heart failure: Secondary | ICD-10-CM | POA: Diagnosis present

## 2020-08-16 DIAGNOSIS — Z515 Encounter for palliative care: Secondary | ICD-10-CM | POA: Diagnosis not present

## 2020-08-16 DIAGNOSIS — Z880 Allergy status to penicillin: Secondary | ICD-10-CM

## 2020-08-16 DIAGNOSIS — A419 Sepsis, unspecified organism: Secondary | ICD-10-CM | POA: Diagnosis not present

## 2020-08-16 DIAGNOSIS — Z66 Do not resuscitate: Secondary | ICD-10-CM | POA: Diagnosis present

## 2020-08-16 DIAGNOSIS — R7881 Bacteremia: Secondary | ICD-10-CM | POA: Diagnosis not present

## 2020-08-16 DIAGNOSIS — J90 Pleural effusion, not elsewhere classified: Secondary | ICD-10-CM | POA: Diagnosis not present

## 2020-08-16 DIAGNOSIS — Z7189 Other specified counseling: Secondary | ICD-10-CM | POA: Diagnosis not present

## 2020-08-16 DIAGNOSIS — Z20822 Contact with and (suspected) exposure to covid-19: Secondary | ICD-10-CM | POA: Diagnosis present

## 2020-08-16 DIAGNOSIS — Z7401 Bed confinement status: Secondary | ICD-10-CM

## 2020-08-16 DIAGNOSIS — Z79899 Other long term (current) drug therapy: Secondary | ICD-10-CM

## 2020-08-16 DIAGNOSIS — Z91048 Other nonmedicinal substance allergy status: Secondary | ICD-10-CM | POA: Diagnosis not present

## 2020-08-16 DIAGNOSIS — Z853 Personal history of malignant neoplasm of breast: Secondary | ICD-10-CM

## 2020-08-16 DIAGNOSIS — I252 Old myocardial infarction: Secondary | ICD-10-CM | POA: Diagnosis not present

## 2020-08-16 DIAGNOSIS — G309 Alzheimer's disease, unspecified: Secondary | ICD-10-CM | POA: Diagnosis not present

## 2020-08-16 DIAGNOSIS — G20 Parkinson's disease: Secondary | ICD-10-CM | POA: Diagnosis present

## 2020-08-16 DIAGNOSIS — J441 Chronic obstructive pulmonary disease with (acute) exacerbation: Secondary | ICD-10-CM | POA: Diagnosis present

## 2020-08-16 DIAGNOSIS — R6889 Other general symptoms and signs: Secondary | ICD-10-CM | POA: Diagnosis not present

## 2020-08-16 DIAGNOSIS — R0600 Dyspnea, unspecified: Secondary | ICD-10-CM | POA: Diagnosis present

## 2020-08-16 DIAGNOSIS — F0281 Dementia in other diseases classified elsewhere with behavioral disturbance: Secondary | ICD-10-CM | POA: Diagnosis present

## 2020-08-16 DIAGNOSIS — F03918 Unspecified dementia, unspecified severity, with other behavioral disturbance: Secondary | ICD-10-CM | POA: Diagnosis present

## 2020-08-16 DIAGNOSIS — R6510 Systemic inflammatory response syndrome (SIRS) of non-infectious origin without acute organ dysfunction: Secondary | ICD-10-CM | POA: Diagnosis present

## 2020-08-16 DIAGNOSIS — R069 Unspecified abnormalities of breathing: Secondary | ICD-10-CM | POA: Diagnosis not present

## 2020-08-16 DIAGNOSIS — I480 Paroxysmal atrial fibrillation: Secondary | ICD-10-CM | POA: Diagnosis not present

## 2020-08-16 DIAGNOSIS — R4020 Unspecified coma: Secondary | ICD-10-CM | POA: Diagnosis not present

## 2020-08-16 DIAGNOSIS — R1310 Dysphagia, unspecified: Secondary | ICD-10-CM | POA: Diagnosis not present

## 2020-08-16 DIAGNOSIS — R338 Other retention of urine: Secondary | ICD-10-CM

## 2020-08-16 DIAGNOSIS — B9689 Other specified bacterial agents as the cause of diseases classified elsewhere: Secondary | ICD-10-CM | POA: Diagnosis not present

## 2020-08-16 DIAGNOSIS — I700 Atherosclerosis of aorta: Secondary | ICD-10-CM | POA: Diagnosis not present

## 2020-08-16 DIAGNOSIS — Z9010 Acquired absence of unspecified breast and nipple: Secondary | ICD-10-CM | POA: Diagnosis not present

## 2020-08-16 DIAGNOSIS — I517 Cardiomegaly: Secondary | ICD-10-CM | POA: Diagnosis not present

## 2020-08-16 DIAGNOSIS — G9341 Metabolic encephalopathy: Secondary | ICD-10-CM | POA: Diagnosis not present

## 2020-08-16 DIAGNOSIS — J9601 Acute respiratory failure with hypoxia: Secondary | ICD-10-CM | POA: Diagnosis present

## 2020-08-16 DIAGNOSIS — R0682 Tachypnea, not elsewhere classified: Secondary | ICD-10-CM | POA: Diagnosis not present

## 2020-08-16 DIAGNOSIS — K5730 Diverticulosis of large intestine without perforation or abscess without bleeding: Secondary | ICD-10-CM | POA: Diagnosis not present

## 2020-08-16 DIAGNOSIS — I4891 Unspecified atrial fibrillation: Secondary | ICD-10-CM | POA: Diagnosis not present

## 2020-08-16 DIAGNOSIS — I10 Essential (primary) hypertension: Secondary | ICD-10-CM | POA: Diagnosis not present

## 2020-08-16 DIAGNOSIS — N179 Acute kidney failure, unspecified: Secondary | ICD-10-CM | POA: Diagnosis not present

## 2020-08-16 DIAGNOSIS — R404 Transient alteration of awareness: Secondary | ICD-10-CM | POA: Diagnosis not present

## 2020-08-16 DIAGNOSIS — Z743 Need for continuous supervision: Secondary | ICD-10-CM | POA: Diagnosis not present

## 2020-08-16 DIAGNOSIS — F0391 Unspecified dementia with behavioral disturbance: Secondary | ICD-10-CM | POA: Diagnosis present

## 2020-08-16 DIAGNOSIS — I110 Hypertensive heart disease with heart failure: Secondary | ICD-10-CM | POA: Diagnosis present

## 2020-08-16 DIAGNOSIS — Z7982 Long term (current) use of aspirin: Secondary | ICD-10-CM

## 2020-08-16 DIAGNOSIS — Z833 Family history of diabetes mellitus: Secondary | ICD-10-CM | POA: Diagnosis not present

## 2020-08-16 DIAGNOSIS — R6520 Severe sepsis without septic shock: Secondary | ICD-10-CM

## 2020-08-16 DIAGNOSIS — J3489 Other specified disorders of nose and nasal sinuses: Secondary | ICD-10-CM | POA: Diagnosis not present

## 2020-08-16 DIAGNOSIS — R0602 Shortness of breath: Secondary | ICD-10-CM | POA: Diagnosis not present

## 2020-08-16 LAB — BRAIN NATRIURETIC PEPTIDE: B Natriuretic Peptide: 478 pg/mL — ABNORMAL HIGH (ref 0.0–100.0)

## 2020-08-16 LAB — COMPREHENSIVE METABOLIC PANEL
ALT: 24 U/L (ref 0–44)
AST: 32 U/L (ref 15–41)
Albumin: 3.1 g/dL — ABNORMAL LOW (ref 3.5–5.0)
Alkaline Phosphatase: 72 U/L (ref 38–126)
Anion gap: 10 (ref 5–15)
BUN: 21 mg/dL (ref 8–23)
CO2: 26 mmol/L (ref 22–32)
Calcium: 8.6 mg/dL — ABNORMAL LOW (ref 8.9–10.3)
Chloride: 110 mmol/L (ref 98–111)
Creatinine, Ser: 1.05 mg/dL — ABNORMAL HIGH (ref 0.44–1.00)
GFR, Estimated: 48 mL/min — ABNORMAL LOW (ref >60)
Glucose, Bld: 167 mg/dL — ABNORMAL HIGH (ref 70–99)
Potassium: 3.4 mmol/L — ABNORMAL LOW (ref 3.5–5.1)
Sodium: 146 mmol/L — ABNORMAL HIGH (ref 135–145)
Total Bilirubin: 0.5 mg/dL (ref 0.3–1.2)
Total Protein: 8.6 g/dL — ABNORMAL HIGH (ref 6.5–8.1)

## 2020-08-16 LAB — CBC WITH DIFFERENTIAL/PLATELET
Abs Immature Granulocytes: 0.24 10*3/uL — ABNORMAL HIGH (ref 0.00–0.07)
Basophils Absolute: 0.1 10*3/uL (ref 0.0–0.1)
Basophils Relative: 0 %
Eosinophils Absolute: 0.1 10*3/uL (ref 0.0–0.5)
Eosinophils Relative: 0 %
HCT: 40.2 % (ref 36.0–46.0)
Hemoglobin: 12.1 g/dL (ref 12.0–15.0)
Immature Granulocytes: 1 %
Lymphocytes Relative: 6 %
Lymphs Abs: 1.1 10*3/uL (ref 0.7–4.0)
MCH: 23 pg — ABNORMAL LOW (ref 26.0–34.0)
MCHC: 30.1 g/dL (ref 30.0–36.0)
MCV: 76.3 fL — ABNORMAL LOW (ref 80.0–100.0)
Monocytes Absolute: 0.9 10*3/uL (ref 0.1–1.0)
Monocytes Relative: 4 %
Neutro Abs: 18 10*3/uL — ABNORMAL HIGH (ref 1.7–7.7)
Neutrophils Relative %: 89 %
Platelets: 325 10*3/uL (ref 150–400)
RBC: 5.27 MIL/uL — ABNORMAL HIGH (ref 3.87–5.11)
RDW: 19 % — ABNORMAL HIGH (ref 11.5–15.5)
WBC: 20.4 10*3/uL — ABNORMAL HIGH (ref 4.0–10.5)
nRBC: 0 % (ref 0.0–0.2)

## 2020-08-16 LAB — URINALYSIS, ROUTINE W REFLEX MICROSCOPIC
Bilirubin Urine: NEGATIVE
Glucose, UA: NEGATIVE mg/dL
Ketones, ur: NEGATIVE mg/dL
Leukocytes,Ua: NEGATIVE
Nitrite: NEGATIVE
Protein, ur: 100 mg/dL — AB
RBC / HPF: >50 RBC/hpf — ABNORMAL HIGH (ref 0–5)
Specific Gravity, Urine: 1.018 (ref 1.005–1.030)
pH: 5 (ref 5.0–8.0)

## 2020-08-16 LAB — BLOOD GAS, ARTERIAL
Acid-Base Excess: 2.4 mmol/L — ABNORMAL HIGH (ref 0.0–2.0)
Bicarbonate: 26.4 mmol/L (ref 20.0–28.0)
Drawn by: 35043
FIO2: 36
O2 Saturation: 96.3 %
Patient temperature: 37
pCO2 arterial: 42 mmHg (ref 32.0–48.0)
pH, Arterial: 7.417 (ref 7.350–7.450)
pO2, Arterial: 88.3 mmHg (ref 83.0–108.0)

## 2020-08-16 LAB — LACTIC ACID, PLASMA: Lactic Acid, Venous: 1.8 mmol/L (ref 0.5–1.9)

## 2020-08-16 LAB — PROTIME-INR
INR: 1.2 (ref 0.8–1.2)
Prothrombin Time: 15 seconds (ref 11.4–15.2)

## 2020-08-16 LAB — RESP PANEL BY RT-PCR (FLU A&B, COVID) ARPGX2
Influenza A by PCR: NEGATIVE
Influenza B by PCR: NEGATIVE
SARS Coronavirus 2 by RT PCR: NEGATIVE

## 2020-08-16 LAB — APTT: aPTT: 33 seconds (ref 24–36)

## 2020-08-16 MED ORDER — ACETAMINOPHEN 325 MG PO TABS: 650.0000 mg | ORAL_TABLET | Freq: Four times a day (QID) | ORAL | Status: DC | PRN

## 2020-08-16 MED ORDER — ENOXAPARIN SODIUM 40 MG/0.4ML ~~LOC~~ SOLN
40.0000 mg | SUBCUTANEOUS | Status: DC
  Administered 2020-08-17 – 2020-08-18 (×2): 40 mg via SUBCUTANEOUS
  Filled 2020-08-16 (×2): qty 0.4

## 2020-08-16 MED ORDER — IPRATROPIUM-ALBUTEROL 0.5-2.5 (3) MG/3ML IN SOLN
3.0000 mL | Freq: Four times a day (QID) | RESPIRATORY_TRACT | Status: DC
  Administered 2020-08-17: 3 mL via RESPIRATORY_TRACT
  Filled 2020-08-16: qty 3

## 2020-08-16 MED ORDER — IOHEXOL 350 MG/ML SOLN
100.0000 mL | Freq: Once | INTRAVENOUS | Status: AC | PRN
  Administered 2020-08-16: 100 mL via INTRAVENOUS

## 2020-08-16 MED ORDER — LACTATED RINGERS IV BOLUS (SEPSIS)
1000.0000 mL | Freq: Once | INTRAVENOUS | Status: AC
  Administered 2020-08-16: 1000 mL via INTRAVENOUS

## 2020-08-16 MED ORDER — CLINDAMYCIN PHOSPHATE 300 MG/50ML IV SOLN
300.0000 mg | Freq: Once | INTRAVENOUS | Status: AC
  Administered 2020-08-16: 300 mg via INTRAVENOUS
  Filled 2020-08-16: qty 50

## 2020-08-16 MED ORDER — VANCOMYCIN HCL 1500 MG/300ML IV SOLN
1500.0000 mg | Freq: Once | INTRAVENOUS | Status: AC
  Administered 2020-08-16: 1500 mg via INTRAVENOUS
  Filled 2020-08-16: qty 300

## 2020-08-16 MED ORDER — ALBUTEROL SULFATE (2.5 MG/3ML) 0.083% IN NEBU
2.5000 mg | INHALATION_SOLUTION | Freq: Once | RESPIRATORY_TRACT | Status: AC
  Administered 2020-08-16: 2.5 mg via RESPIRATORY_TRACT
  Filled 2020-08-16: qty 3

## 2020-08-16 MED ORDER — ONDANSETRON HCL 4 MG PO TABS: 4.0000 mg | ORAL_TABLET | Freq: Four times a day (QID) | ORAL | Status: DC | PRN

## 2020-08-16 MED ORDER — ACETAMINOPHEN 650 MG RE SUPP
650.0000 mg | Freq: Four times a day (QID) | RECTAL | Status: DC | PRN
  Filled 2020-08-16: qty 1

## 2020-08-16 MED ORDER — SODIUM CHLORIDE 0.9 % IV SOLN
1.0000 g | Freq: Two times a day (BID) | INTRAVENOUS | Status: DC
  Administered 2020-08-17 – 2020-08-18 (×3): 1 g via INTRAVENOUS
  Filled 2020-08-16 (×3): qty 1

## 2020-08-16 MED ORDER — LACTATED RINGERS IV SOLN: INTRAVENOUS | Status: DC

## 2020-08-16 MED ORDER — METRONIDAZOLE IN NACL 5-0.79 MG/ML-% IV SOLN: 500.0000 mg | Freq: Three times a day (TID) | INTRAVENOUS | Status: DC

## 2020-08-16 MED ORDER — ACETAMINOPHEN 650 MG RE SUPP
650.0000 mg | Freq: Once | RECTAL | Status: AC
  Administered 2020-08-16: 650 mg via RECTAL
  Filled 2020-08-16: qty 1

## 2020-08-16 MED ORDER — SODIUM CHLORIDE 0.9 % IV SOLN
2.0000 g | Freq: Once | INTRAVENOUS | Status: AC
  Administered 2020-08-16: 2 g via INTRAVENOUS
  Filled 2020-08-16: qty 2

## 2020-08-16 MED ORDER — IPRATROPIUM-ALBUTEROL 0.5-2.5 (3) MG/3ML IN SOLN: 3.0000 mL | RESPIRATORY_TRACT | Status: DC

## 2020-08-16 MED ORDER — ONDANSETRON HCL 4 MG/2ML IJ SOLN: 4.0000 mg | Freq: Four times a day (QID) | INTRAMUSCULAR | Status: DC | PRN

## 2020-08-16 MED ORDER — VANCOMYCIN HCL IN DEXTROSE 1-5 GM/200ML-% IV SOLN: 1000.0000 mg | Freq: Once | INTRAVENOUS | Status: DC

## 2020-08-16 MED ORDER — LACTATED RINGERS IV BOLUS (SEPSIS): 250.0000 mL | Freq: Once | INTRAVENOUS | Status: DC

## 2020-08-16 MED ORDER — METHYLPREDNISOLONE SODIUM SUCC 125 MG IJ SOLR
60.0000 mg | Freq: Two times a day (BID) | INTRAMUSCULAR | Status: DC
  Administered 2020-08-16 – 2020-08-18 (×4): 60 mg via INTRAVENOUS
  Filled 2020-08-16 (×4): qty 2

## 2020-08-16 MED ORDER — SODIUM CHLORIDE 0.9 % IV SOLN: 2.0000 g | Freq: Two times a day (BID) | INTRAVENOUS | Status: DC

## 2020-08-16 MED ORDER — VANCOMYCIN HCL 1250 MG/250ML IV SOLN
1250.0000 mg | INTRAVENOUS | Status: DC
  Administered 2020-08-18: 1250 mg via INTRAVENOUS
  Filled 2020-08-16: qty 250

## 2020-08-16 MED ORDER — METRONIDAZOLE 500 MG PO TABS
500.0000 mg | ORAL_TABLET | Freq: Three times a day (TID) | ORAL | Status: DC
  Filled 2020-08-16: qty 1

## 2020-08-16 NOTE — Progress Notes (Signed)
Elink following code sepsis °

## 2020-08-16 NOTE — H&P (Addendum)
History and Physical    [name] EQA:[id number] DOB: 1919-10-04 DOA: 08/16/2020  PCP: Iona Beard, MD   Patient coming from: Home  I have personally briefly reviewed patient's old medical records in [place]  Chief Complaint: AMS  HPI: [name] is a 86 y.o. female with medical history significant for diastolic CHF, hypertension, Parkinson's disease, dementia with behavioral disturbance. Patient was brought to the ED via EMS with reports of congestion.  On arrival to the ED, patient's was unresponsive.  ED provider and myself were unable to contact patient's family.  ED Course: Temperature 103.1, heart rate 60s to 80s, tachypneic to 42, blood pressure systolic ranging from 89 - 110s.  O2 sats 90% on 2 L.  Placed on 4 L.  Lactic acid 1.8.  WBC 20.4.  BNP elevated 178.  UA rare bacteria.  Without acute abnormality, showed colonic diverticulosis without diverticulitis.  Creatinine 1.05, elevated.  Patient was started on broad-spectrum antibiotics Vanco and cefepime.  2.25 L bolus given. Hospitalist to admit for sepsis.  Review of Systems: Unable to assess due to altered mental status.  Past Medical History:  Diagnosis Date  . Alzheimer's dementia ([place])   . Cancer (HCC)    breast  . CHF (congestive heart failure) ([place])   . Heart attack ([place])   . Hypertension   . Parkinson disease [facility])     Past Surgical History:  Procedure Laterality Date  . ABDOMINAL HYSTERECTOMY    . arm surgery    . BREAST SURGERY    . CHOLECYSTECTOMY    . COLON SURGERY    . HIP PINNING,CANNULATED Right 02/01/2013   Procedure: CANNULATED HIP PINNING;  Surgeon: Sanjuana Kava, MD;  Location: [facility];  Service: Orthopedics;  Laterality: Right;  . LEG SURGERY    . MASTECTOMY    . PELVIC FRACTURE SURGERY    . small intestine removed    . TONSILLECTOMY       reports that she has never smoked. She has never used smokeless tobacco. She reports that she does not drink alcohol and does not use drugs.   Allergies  Allergen Reactions  . Adhesive [Tape] Rash  . Ensure Pudding [Nutritional Supplements] Swelling and Rash    Throat swelling  . Penicillins Swelling and Rash    Has patient had a PCN reaction causing immediate rash, facial/tongue/throat swelling, SOB or lightheadedness with hypotension: Yes Has patient had a PCN reaction causing severe rash involving mucus membranes or skin necrosis: Yes Has patient had a PCN reaction that required hospitalization No Has patient had a PCN reaction occurring within the last 10 years: No If all of the above answers are "NO", then may proceed with Cephalosporin use.    Family History  Problem Relation Age of Onset  . Aneurysm Mother   . Diabetes Father     Prior to Admission medications   Medication Sig Start Date End Date Taking? Authorizing Provider  acetaminophen (TYLENOL) 500 MG tablet Take 500 mg by mouth at bedtime. For pain   Yes [provider]  ALPRAZolam (XANAX) 0.5 MG tablet Take 0.5 mg by mouth 3 (three) times daily.    Yes [provider]  aspirin EC 81 MG tablet Take 1 tablet (81 mg total) by mouth daily. 04/05/13  Yes Kathie Dike, MD  benztropine (COGENTIN) 1 MG tablet Take 1 tablet (1 mg total) by mouth daily. 04/05/13  Yes Kathie Dike, MD  citalopram (CELEXA) 20 MG tablet Take 1 tablet (20  mg total) by mouth every morning. 04/05/13  Yes Kathie Dike, MD  ferrous sulfate 325 (65 FE) MG EC tablet Take 1 tablet (325 mg total) by mouth daily. 03/20/19 08/16/20 Yes Kathie Dike, MD  furosemide (LASIX) 20 MG tablet Take 1 tablet (20 mg total) by mouth daily. 04/30/18 08/16/20 Yes Erline Hau, MD  ipratropium-albuterol (DUONEB) 0.5-2.5 (3) MG/3ML SOLN Take 3 mLs by nebulization 4 (four) times daily. 09/28/18  Yes Barton Dubois, MD  metoprolol (LOPRESSOR) 50 MG tablet Take 0.5 tablets (25 mg total) by mouth 2 (two) times daily. 09/24/13  Yes Kathie Dike, MD  pantoprazole (PROTONIX) 40 MG  tablet Take 1 tablet (40 mg total) by mouth daily. 03/20/19 08/16/20 Yes Kathie Dike, MD  rivastigmine (EXELON) 9.5 mg/24hr Place 1 patch (9.5 mg total) onto the skin daily. *Remove and discard used patches* 04/05/13  Yes Memon, Jolaine Artist, MD  zolpidem (AMBIEN) 10 MG tablet Take 0.5 tablets (5 mg total) by mouth at bedtime as needed for sleep. 03/20/19  Yes Kathie Dike, MD  loratadine (CLARITIN) 10 MG tablet Take 1 tablet (10 mg total) by mouth daily as needed for allergies. Patient not taking: Reported on 08/16/2020 09/28/18 09/28/19  Barton Dubois, MD  oxyCODONE (OXY IR/ROXICODONE) 5 MG immediate release tablet Take 1 tablet (5 mg total) by mouth every 12 (twelve) hours as needed for severe pain. Patient not taking: Reported on 08/16/2020 03/20/19   Kathie Dike, MD    Physical Exam: Exam limited by patient's mental status.  Patient is awake, eyes open, but she is not tracking  Vitals:   08/16/20 1630 08/16/20 1822 08/16/20 1900 08/16/20 1930  BP: 103/78  (!) 104/58 (!) 100/56  Pulse: 74  66 65  Resp: (!) 33  (!) 29 (!) 28  Temp:      TempSrc:      SpO2: 99% 98% 93% 90%  Weight:        Constitutional: Calm. Vitals:   08/16/20 1630 08/16/20 1822 08/16/20 1900 08/16/20 1930  BP: 103/78  (!) 104/58 (!) 100/56  Pulse: 74  66 65  Resp: (!) 33  (!) 29 (!) 28  Temp:      TempSrc:      SpO2: 99% 98% 93% 90%  Weight:       Eyes: PERRL, lids and conjunctivae normal ENMT: Limited exam, mucous membranes appear dry Neck: normal, no masses, no thyromegaly, limited exam. Respiratory: clear to auscultation bilaterally, no wheezing, no crackles. Normal respiratory effort. No accessory muscle use.  Cardiovascular: Regular rate and rhythm,  No extremity edema. 2+ pedal pulses.   Abdomen: no tenderness, no masses palpated. No hepatosplenomegaly. Bowel sounds positive.  Musculoskeletal: no clubbing / cyanosis. No joint deformity upper and lower extremities. Good ROM, no contractures. Normal  muscle tone.  Skin: no rashes, lesions, ulcers. No induration Neurologic: Limited exam.  Minimal movement of extremities to mild sternal rub. Psychiatric: Awake, not following directions, eyes open not tracking,  Labs on Admission: I have personally reviewed following labs and imaging studies  CBC: Recent Labs  Lab 08/16/20 1300  WBC 20.4*  NEUTROABS 18.0*  HGB 12.1  HCT 40.2  MCV 76.3*  PLT 478   Basic Metabolic Panel: Recent Labs  Lab 08/16/20 1300  NA 146*  K 3.4*  CL 110  CO2 26  GLUCOSE 167*  BUN 21  CREATININE 1.05*  CALCIUM 8.6*   Liver Function Tests: Recent Labs  Lab 08/16/20 1300  AST 32  ALT 24  ALKPHOS  72  BILITOT 0.5  PROT 8.6*  ALBUMIN 3.1*   Coagulation Profile: Recent Labs  Lab 08/16/20 1300  INR 1.2   Urine analysis:    Component Value Date/Time   COLORURINE YELLOW 08/16/2020 1448   APPEARANCEUR HAZY (A) 08/16/2020 1448   LABSPEC 1.018 08/16/2020 1448   PHURINE 5.0 08/16/2020 1448   GLUCOSEU NEGATIVE 08/16/2020 1448   HGBUR MODERATE (A) 08/16/2020 1448   BILIRUBINUR NEGATIVE 08/16/2020 1448   KETONESUR NEGATIVE 08/16/2020 1448   PROTEINUR 100 (A) 08/16/2020 1448   UROBILINOGEN 0.2 01/06/2015 1842   NITRITE NEGATIVE 08/16/2020 1448   LEUKOCYTESUR NEGATIVE 08/16/2020 1448    Radiological Exams on Admission: CT Head Wo Contrast  Result Date: 08/16/2020 CLINICAL DATA:  Altered level of consciousness, sepsis, tachypnea EXAM: CT HEAD WITHOUT CONTRAST TECHNIQUE: Contiguous axial images were obtained from the base of the skull through the vertex without intravenous contrast. COMPARISON:  10/18/2017 FINDINGS: Brain: No acute infarct or hemorrhage. Lateral ventricles and midline structures are stable. 2.5 x 2.2 cm mass within the anterior aspect of the right middle cranial fossa not appreciably changed since prior MRI dating to 2019, most consistent with meningioma. No significant mass effect. No acute extra-axial fluid collections.  Vascular: No hyperdense vessel or unexpected calcification. Skull: Normal. Negative for fracture or focal lesion. Sinuses/Orbits: Diffuse mucosal thickening throughout the paranasal sinuses, most pronounced within the sphenoid sinuses. Other: None. IMPRESSION: 1. No acute intracranial process. 2. Probable meningioma right middle cranial fossa, without significant change since 2019. Electronically Signed   By: Randa Ngo M.D.   On: 08/16/2020 17:34   CT Angio Chest PE W/Cm &/Or Wo Cm  Result Date: 08/16/2020 CLINICAL DATA:  Altered level of consciousness, sepsis, tachypnea EXAM: CT ANGIOGRAPHY CHEST CT ABDOMEN AND PELVIS WITH CONTRAST TECHNIQUE: Multidetector CT imaging of the chest was performed using the standard protocol during bolus administration of intravenous contrast. Multiplanar CT image reconstructions and MIPs were obtained to evaluate the vascular anatomy. Multidetector CT imaging of the abdomen and pelvis was performed using the standard protocol during bolus administration of intravenous contrast. CONTRAST:  149mL OMNIPAQUE IOHEXOL 350 MG/ML SOLN COMPARISON:  08/16/2020, 09/17/2013 FINDINGS: CTA CHEST FINDINGS Cardiovascular: This is a technically adequate evaluation of the pulmonary vasculature. No filling defects or pulmonary emboli. The heart is enlarged with prominent left atrial dilatation. No pericardial effusion. Mild atherosclerosis of the aorta and coronary vessels. Mediastinum/Nodes: No enlarged mediastinal, hilar, or axillary lymph nodes. Thyroid gland, trachea, and esophagus demonstrate no significant findings. Lungs/Pleura: Dependent hypoventilatory changes are seen bilaterally. No acute airspace disease, effusion, or pneumothorax. Musculoskeletal: No acute or destructive bony lesions. Chronic appearing T9 compression deformity. Reconstructed images demonstrate no additional findings. Review of the MIP images confirms the above findings. CT ABDOMEN and PELVIS FINDINGS  Hepatobiliary: Numerous hepatic cysts are again identified, without significant change since prior study. Gallbladder is unremarkable. Pancreas: Unremarkable. No pancreatic ductal dilatation or surrounding inflammatory changes. Spleen: Normal in size without focal abnormality. Adrenals/Urinary Tract: Bilateral renal cortical atrophy. Stable left renal cortical cyst. The adrenals are unremarkable. Bladder is decompressed with a Foley catheter. Stomach/Bowel: No bowel obstruction or ileus. Minimal descending colonic diverticulosis without diverticulitis. No bowel wall thickening or inflammatory change. Vascular/Lymphatic: Aortic atherosclerosis. No enlarged abdominal or pelvic lymph nodes. Reproductive: Status post hysterectomy. No adnexal masses. Other: No free fluid or free gas. No abdominal wall hernia. Musculoskeletal: No acute or destructive bony lesions. Chronic L1 compression deformity. Reconstructed images demonstrate no additional findings. Review of the MIP images  confirms the above findings. IMPRESSION: 1. No evidence of pulmonary embolus. 2. Cardiomegaly with prominent left atrial dilatation. 3. No acute intra-abdominal or intrapelvic process. 4. Minimal descending colonic diverticulosis without diverticulitis. 5. Aortic Atherosclerosis (ICD10-I70.0). Electronically Signed   By: Randa Ngo M.D.   On: 08/16/2020 17:44   CT Abdomen Pelvis W Contrast  Result Date: 08/16/2020 CLINICAL DATA:  Altered level of consciousness, sepsis, tachypnea EXAM: CT ANGIOGRAPHY CHEST CT ABDOMEN AND PELVIS WITH CONTRAST TECHNIQUE: Multidetector CT imaging of the chest was performed using the standard protocol during bolus administration of intravenous contrast. Multiplanar CT image reconstructions and MIPs were obtained to evaluate the vascular anatomy. Multidetector CT imaging of the abdomen and pelvis was performed using the standard protocol during bolus administration of intravenous contrast. CONTRAST:  181mL  OMNIPAQUE IOHEXOL 350 MG/ML SOLN COMPARISON:  08/16/2020, 09/17/2013 FINDINGS: CTA CHEST FINDINGS Cardiovascular: This is a technically adequate evaluation of the pulmonary vasculature. No filling defects or pulmonary emboli. The heart is enlarged with prominent left atrial dilatation. No pericardial effusion. Mild atherosclerosis of the aorta and coronary vessels. Mediastinum/Nodes: No enlarged mediastinal, hilar, or axillary lymph nodes. Thyroid gland, trachea, and esophagus demonstrate no significant findings. Lungs/Pleura: Dependent hypoventilatory changes are seen bilaterally. No acute airspace disease, effusion, or pneumothorax. Musculoskeletal: No acute or destructive bony lesions. Chronic appearing T9 compression deformity. Reconstructed images demonstrate no additional findings. Review of the MIP images confirms the above findings. CT ABDOMEN and PELVIS FINDINGS Hepatobiliary: Numerous hepatic cysts are again identified, without significant change since prior study. Gallbladder is unremarkable. Pancreas: Unremarkable. No pancreatic ductal dilatation or surrounding inflammatory changes. Spleen: Normal in size without focal abnormality. Adrenals/Urinary Tract: Bilateral renal cortical atrophy. Stable left renal cortical cyst. The adrenals are unremarkable. Bladder is decompressed with a Foley catheter. Stomach/Bowel: No bowel obstruction or ileus. Minimal descending colonic diverticulosis without diverticulitis. No bowel wall thickening or inflammatory change. Vascular/Lymphatic: Aortic atherosclerosis. No enlarged abdominal or pelvic lymph nodes. Reproductive: Status post hysterectomy. No adnexal masses. Other: No free fluid or free gas. No abdominal wall hernia. Musculoskeletal: No acute or destructive bony lesions. Chronic L1 compression deformity. Reconstructed images demonstrate no additional findings. Review of the MIP images confirms the above findings. IMPRESSION: 1. No evidence of pulmonary embolus.  2. Cardiomegaly with prominent left atrial dilatation. 3. No acute intra-abdominal or intrapelvic process. 4. Minimal descending colonic diverticulosis without diverticulitis. 5. Aortic Atherosclerosis (ICD10-I70.0). Electronically Signed   By: Randa Ngo M.D.   On: 08/16/2020 17:44   DG Chest Port 1 View  Result Date: 08/16/2020 CLINICAL DATA:  Possible sepsis EXAM: PORTABLE CHEST 1 VIEW COMPARISON:  03/18/2019 FINDINGS: Low lung volumes. Mild chronic interstitial prominence. No definite new consolidation. No pleural effusion. Similar cardiomediastinal contours with cardiomegaly. IMPRESSION: No acute process in the chest. Electronically Signed   By: Macy Mis M.D.   On: 08/16/2020 13:18    EKG: Independently reviewed.  Sinus rhythm, with marked sinus arrhythmia.  QTc 470.  No significant change from prior EKG  Assessment/Plan Principal Problem:   Severe sepsis (HCC) Active Problems:   Dementia with behavioral disturbance (HCC)   Parkinson disease (HCC)   Essential hypertension   Chronic diastolic CHF (congestive heart failure) (HCC)    Severe SIRs- febrile to 103.1, initial tachypnea to 42, soft blood pressures, with leukocytosis of 20.4.  Evidence of endorgan dysfunction with acute kidney injury and encephalopathy .  Lactic acid normal 1.8.  At this time focus of infection not identified-CTA chest, CT abdomen without acute abnormality,  UA not suggestive of infection. -Continue broad-spectrum antibiotics IV vancomycin and meropenem (IV metronidazole not available, and clindamycin would be high risk considering her age, allergy to penicillins) -CBC, BMP in the morning -Follow-up blood and urine cultures - Bolus 2.25 L given, continue 1/2 N/s 100cc/hr x 20hrs -Remain n.p.o. -Obtain ABG  Acute kidney injury- mild, creatinine 1.05, baseline about 0.7. Contrast exposure today. - Hydrate  Acute respiratory failure, COPD exacerbation-O2 sats 89 to 90% on 2 L, O2 sats increased to 4  L.  CTA chest done without acute abnormality.  Initially patient was tachypneic and wheezing, but currently lung exam unremarkable. -Supplemental O2 -DuoNebs scheduled and as needed -IV Solu-Medrol 60 twice daily - Antibiotics  Diastolic CHF-stable and compensated, currently requiring fluids for SIRS physiology.  Last Echo 2016 EF 60 to 65%, G2 DD, with elevated PA pressure of 74. -Hold home Lasix 20 mg, hold metoprolol  Dementia with behavioral disturbance, parkinsonism- likely with metabolic encephalopathy, I am unable to confirm this baseline mental status..  head CT without acute abnormality.  Clinically she appears chronically ill and appears to have advanced dementia.  Notes - 2020 admission for left femur fracture, patient is nonambulatory at baseline, with advanced dementia requiring full assist for all ADLs. - Hold rivastigmine, benztropine while n.p.o.  COPD-stable without wheezing appreciated -DuoNeb scheduled as needed  HTN- initially hypotensive. -Hold home metoprolol, Lasix 20 mg daily  DVT prophylaxis:  lovenox Code Status:  DNR, documented in chart by ED nurse, patient is DNR, confirmed with patient's family. Family Communication: None at bedside.  Patient's daughter listed on demographics [name], I tried both home and mobile number no response. Disposition Plan: >2 days Consults called:  none Admission status: inpt, step down I certify that at the point of admission it is my clinical judgment that the patient will require inpatient hospital care spanning beyond 2 midnights from the point of admission due to high intensity of service, high risk for further deterioration and high frequency of surveillance required. The following factors support the patient status of inpatient: AMS, septic, requiring close monitoring, IV antibiotics pending culture results.   Bethena Roys MD [facility]  08/16/2020, 11:04 PM

## 2020-08-16 NOTE — ED Notes (Signed)
MD aware of pts status.

## 2020-08-16 NOTE — ED Notes (Signed)
Pt's O2 89-90% on 2LPM. Increased O2 to 4LPM via [place]

## 2020-08-16 NOTE — Progress Notes (Addendum)
Pharmacy Antibiotic Note  [name] is a 86 y.o. female admitted on 08/16/2020 with SIRS.  Pharmacy has been consulted to change cefepime to meropenem for anaerobe coverage.  Plan: Meropenem 1g IV Q12H.  Height: 5\' 1"  (154.9 cm) Weight: 70.8 kg (156 lb 1.4 oz) IBW/kg (Calculated) : 47.8  Temp (24hrs), Avg:103.1 F (39.5 C), Min:103.1 F (39.5 C), Max:103.1 F (39.5 C)  Recent Labs  Lab 08/16/20 1300  WBC 20.4*  CREATININE 1.05*  LATICACIDVEN 1.8    Estimated Creatinine Clearance: 26.9 mL/min (A) (by C-G formula based on SCr of 1.05 mg/dL (H)).    Allergies  Allergen Reactions  . Adhesive [Tape] Rash  . Ensure Pudding [Nutritional Supplements] Swelling and Rash    Throat swelling  . Penicillins Swelling and Rash    Has patient had a PCN reaction causing immediate rash, facial/tongue/throat swelling, SOB or lightheadedness with hypotension: Yes Has patient had a PCN reaction causing severe rash involving mucus membranes or skin necrosis: Yes Has patient had a PCN reaction that required hospitalization No Has patient had a PCN reaction occurring within the last 10 years: No If all of the above answers are "NO", then may proceed with Cephalosporin use.     Thank you for allowing pharmacy to be a part of this patient's care.  Wynona Neat, PharmD, BCPS  08/16/2020 11:12 PM

## 2020-08-16 NOTE — ED Notes (Signed)
Pt family verified pt is a DNR

## 2020-08-16 NOTE — ED Provider Notes (Signed)
[place] of the patient assumed at the change of shift pending workup for sepsis of unknown etiology.   5:57 PM Labs and CT images reviewed, no definite source of infection. She is still breathing rapidly and having some wheezing. Will give a neb treatment to see if this helps her symptoms and discuss admission with the hospitalist.   6:25 PM Spoke with Dr. Arlyce Dice, Hospitalist, who will evaluate the patient for admission.    Truddie Hidden, MD 08/16/20 [phone]

## 2020-08-16 NOTE — ED Triage Notes (Signed)
Pts' daughter called out for pt having congestion.

## 2020-08-16 NOTE — Progress Notes (Signed)
Pharmacy Antibiotic Note  [name] is a 86 y.o. female admitted on 08/16/2020 with unknown source.  Pharmacy has been consulted for Vancomycin and Cefepime dosing.  Plan: Vancomycin 1500 mg IV x 1 dose. Vancomycin 1250 mg IV every 48 hours. Expected AUC 524. Cefepime 2000 mg IV every 12 hours. Monitor labs, c/s, and vanco level as indicated.  Weight: 70.8 kg (156 lb)  Temp (24hrs), Avg:103.1 F (39.5 C), Min:103.1 F (39.5 C), Max:103.1 F (39.5 C)  Recent Labs  Lab 08/16/20 1300  WBC 20.4*  CREATININE 1.05*  LATICACIDVEN 1.8    CrCl cannot be calculated (Unknown ideal weight.).    Allergies  Allergen Reactions  . Adhesive [Tape] Rash  . Ensure Pudding [Nutritional Supplements] Swelling and Rash    Throat swelling  . Penicillins Swelling and Rash    Has patient had a PCN reaction causing immediate rash, facial/tongue/throat swelling, SOB or lightheadedness with hypotension: Yes Has patient had a PCN reaction causing severe rash involving mucus membranes or skin necrosis: Yes Has patient had a PCN reaction that required hospitalization No Has patient had a PCN reaction occurring within the last 10 years: No If all of the above answers are "NO", then may proceed with Cephalosporin use.    Antimicrobials this admission: Vanco 11/29 >>  Cefepime 11/29 >>    Microbiology results: 11/29 BCx: pending 11/29 UCx: pending    Thank you for allowing pharmacy to be a part of this patient's care.  Ramond Craver 08/16/2020 2:10 PM

## 2020-08-16 NOTE — ED Provider Notes (Signed)
[facility] EMERGENCY DEPARTMENT Provider Note   CSN: [id number] Arrival date & time: 08/16/20  1212     History No chief complaint on file.   [name] is a 86 y.o. female.  Level 5 caveat secondary to unresponsive.  86 year old female brought in by EMS from home for nasal congestion.  Found to be febrile tachypneic and hypotensive.  No other history available at this time.  The history is provided by the EMS personnel and the patient. The history is limited by the condition of the patient and the absence of a caregiver. No language interpreter was used.  Fever Max temp prior to arrival:  103 Temp source:  Rectal Severity:  Unable to specify Onset quality:  Unable to specify Timing:  Unable to specify Progression:  Unchanged Chronicity:  New Relieved by:  None tried Worsened by:  Nothing Ineffective treatments:  None tried Associated symptoms: congestion        Past Medical History:  Diagnosis Date  . Alzheimer's dementia ([place])   . Cancer (HCC)    breast  . CHF (congestive heart failure) ([place])   . Heart attack ([place])   . Hypertension   . Parkinson disease [facility])     Patient Active Problem List   Diagnosis Date Noted  . Closed fracture of distal end of right femur, initial encounter ([place]) 03/18/2019  . Acute on chronic respiratory failure ([place]) 09/26/2018  . Acute on chronic diastolic CHF (congestive heart failure) ([place]) 04/28/2018  . Dysarthria 10/18/2017  . Acute metabolic encephalopathy 58/05/9832  . Chronic diastolic CHF (congestive heart failure) ([place]) 10/17/2017  . Fever   . COPD with acute bronchitis ([place]) 10/16/2017  . Dyspnea 01/06/2015  . Essential hypertension 01/06/2015  . Depression with anxiety 01/06/2015  . Insomnia 01/06/2015  . Acute on chronic diastolic congestive heart failure ([place])   . Lesion of liver 09/17/2013  . Microcytic anemia 04/05/2013  . Elevated troponin 04/04/2013  . Fall 01/31/2013  . Dementia with behavioral disturbance  ([place]) 01/31/2013  . Parkinson disease ([place]) 01/31/2013  . Community acquired pneumonia 06/30/2012  . Weakness generalized 06/30/2012    Past Surgical History:  Procedure Laterality Date  . ABDOMINAL HYSTERECTOMY    . arm surgery    . BREAST SURGERY    . CHOLECYSTECTOMY    . COLON SURGERY    . HIP PINNING,CANNULATED Right 02/01/2013   Procedure: CANNULATED HIP PINNING;  Surgeon: Sanjuana Kava, MD;  Location: [facility];  Service: Orthopedics;  Laterality: Right;  . LEG SURGERY    . MASTECTOMY    . PELVIC FRACTURE SURGERY    . small intestine removed    . TONSILLECTOMY       OB History    Gravida  4   Para  4   Term  4   Preterm      AB      Living        SAB      TAB      Ectopic      Multiple      Live Births              Family History  Problem Relation Age of Onset  . Aneurysm Mother   . Diabetes Father     Social History   Tobacco Use  . Smoking status: Never Smoker  . Smokeless tobacco: Never Used  Vaping Use  . Vaping Use: Never used  Substance Use Topics  . Alcohol use: No  .  Drug use: No    Home Medications Prior to Admission medications   Medication Sig Start Date End Date Taking? Authorizing Provider  acetaminophen (TYLENOL) 500 MG tablet Take 500 mg by mouth at bedtime. For pain    [provider]  ALPRAZolam (XANAX) 0.5 MG tablet Take 0.5 mg by mouth 3 (three) times daily.     [provider]  aspirin EC 81 MG tablet Take 1 tablet (81 mg total) by mouth daily. 04/05/13   Kathie Dike, MD  benztropine (COGENTIN) 1 MG tablet Take 1 tablet (1 mg total) by mouth daily. 04/05/13   Kathie Dike, MD  citalopram (CELEXA) 20 MG tablet Take 1 tablet (20 mg total) by mouth every morning. 04/05/13   Kathie Dike, MD  ferrous sulfate 325 (65 FE) MG EC tablet Take 1 tablet (325 mg total) by mouth daily. 03/20/19 03/19/20  Kathie Dike, MD  furosemide (LASIX) 20 MG tablet Take 1 tablet (20 mg total) by mouth daily. 04/30/18  04/30/19  Erline Hau, MD  ipratropium-albuterol (DUONEB) 0.5-2.5 (3) MG/3ML SOLN Take 3 mLs by nebulization 4 (four) times daily. 09/28/18   Barton Dubois, MD  loratadine (CLARITIN) 10 MG tablet Take 1 tablet (10 mg total) by mouth daily as needed for allergies. 09/28/18 09/28/19  Barton Dubois, MD  metoprolol (LOPRESSOR) 50 MG tablet Take 0.5 tablets (25 mg total) by mouth 2 (two) times daily. 09/24/13   Kathie Dike, MD  oxyCODONE (OXY IR/ROXICODONE) 5 MG immediate release tablet Take 1 tablet (5 mg total) by mouth every 12 (twelve) hours as needed for severe pain. 03/20/19   Kathie Dike, MD  pantoprazole (PROTONIX) 40 MG tablet Take 1 tablet (40 mg total) by mouth daily. 03/20/19 03/19/20  Kathie Dike, MD  rivastigmine (EXELON) 9.5 mg/24hr Place 1 patch (9.5 mg total) onto the skin daily. *Remove and discard used patches* 04/05/13   Kathie Dike, MD  zolpidem (AMBIEN) 10 MG tablet Take 0.5 tablets (5 mg total) by mouth at bedtime as needed for sleep. 03/20/19   Kathie Dike, MD    Allergies    Adhesive [tape], Ensure pudding [nutritional supplements], and Penicillins  Review of Systems   Review of Systems  Unable to perform ROS: Patient unresponsive  Constitutional: Positive for fever.  HENT: Positive for congestion.     Physical Exam Updated Vital Signs BP 95/83   Pulse 79   Temp (!) 103.1 F (39.5 C) (Rectal)   Resp (!) 28   Wt 70.8 kg   SpO2 100%   BMI 29.48 kg/m   Physical Exam Vitals and nursing note reviewed.  Constitutional:      General: She is not in acute distress.    Appearance: She is well-developed. She is ill-appearing.  HENT:     Head: Normocephalic and atraumatic.  Eyes:     Conjunctiva/sclera: Conjunctivae normal.  Cardiovascular:     Rate and Rhythm: Normal rate and regular rhythm.     Heart sounds: No murmur heard.   Pulmonary:     Effort: Tachypnea and accessory muscle usage present. No respiratory distress.     Breath sounds:  Wheezing present.  Abdominal:     Palpations: Abdomen is soft.     Tenderness: There is no abdominal tenderness.  Musculoskeletal:        General: No deformity or signs of injury.     Cervical back: Neck supple.     Right lower leg: No edema.     Left lower leg: No  edema.  Skin:    General: Skin is warm and dry.     Capillary Refill: Capillary refill takes less than 2 seconds.  Neurological:     Mental Status: She is lethargic.     GCS: GCS eye subscore is 2. GCS verbal subscore is 2. GCS motor subscore is 5.     Comments: Patient localizing pain all 4 extremities with some withdrawal.     ED Results / Procedures / Treatments   Labs (all labs ordered are listed, but only abnormal results are displayed) Labs Reviewed  COMPREHENSIVE METABOLIC PANEL - Abnormal; Notable for the following components:      Result Value   Sodium 146 (*)    Potassium 3.4 (*)    Glucose, Bld 167 (*)    Creatinine, Ser 1.05 (*)    Calcium 8.6 (*)    Total Protein 8.6 (*)    Albumin 3.1 (*)    GFR, Estimated 48 (*)    All other components within normal limits  CBC WITH DIFFERENTIAL/PLATELET - Abnormal; Notable for the following components:   WBC 20.4 (*)    RBC 5.27 (*)    MCV 76.3 (*)    MCH 23.0 (*)    RDW 19.0 (*)    Neutro Abs 18.0 (*)    Abs Immature Granulocytes 0.24 (*)    All other components within normal limits  URINALYSIS, ROUTINE W REFLEX MICROSCOPIC - Abnormal; Notable for the following components:   APPearance HAZY (*)    Hgb urine dipstick MODERATE (*)    Protein, ur 100 (*)    RBC / HPF >50 (*)    Bacteria, UA RARE (*)    All other components within normal limits  BRAIN NATRIURETIC PEPTIDE - Abnormal; Notable for the following components:   B Natriuretic Peptide 478.0 (*)    All other components within normal limits  CULTURE, BLOOD (ROUTINE X 2)  CULTURE, BLOOD (ROUTINE X 2)  RESP PANEL BY RT-PCR (FLU A&B, COVID) ARPGX2  URINE CULTURE  LACTIC ACID, PLASMA  PROTIME-INR   APTT    EKG EKG Interpretation  Date/Time:  Monday August 16 2020 12:43:48 EST Ventricular Rate:  84 PR Interval:  200 QRS Duration: 88 QT Interval:  398 QTC Calculation: 470 R Axis:   10 Text Interpretation: Sinus rhythm with marked sinus arrhythmia Anterior infarct , age undetermined Abnormal ECG No significant change since prior 6/20 Confirmed by Aletta Edouard [phone]) on 08/16/2020 12:53:04 PM   Radiology No results found.  Procedures .Critical Care Performed by: Hayden Rasmussen, MD Authorized by: Hayden Rasmussen, MD   Critical care provider statement:    Critical care time (minutes):  45   Critical care time was exclusive of:  Separately billable procedures and treating other patients   Critical care was necessary to treat or prevent imminent or life-threatening deterioration of the following conditions:  Sepsis   Critical care was time spent personally by me on the following activities:  Discussions with consultants, evaluation of patient's response to treatment, examination of patient, ordering and performing treatments and interventions, ordering and review of laboratory studies, ordering and review of radiographic studies, pulse oximetry, re-evaluation of patient's condition, obtaining history from patient or surrogate, review of old charts and development of treatment plan with patient or surrogate   (including critical care time)  Medications Ordered in ED Medications  lactated ringers infusion ( Intravenous Not Given 08/16/20 1645)  lactated ringers bolus 1,000 mL (0 mLs Intravenous Stopped 08/16/20 1445)  And  lactated ringers bolus 1,000 mL (0 mLs Intravenous Stopped 08/16/20 1625)    And  lactated ringers bolus 250 mL (250 mLs Intravenous Not Given 08/16/20 1644)  ceFEPIme (MAXIPIME) 2 g in sodium chloride 0.9 % 100 mL IVPB (has no administration in time range)  vancomycin (VANCOREADY) IVPB 1250 mg/250 mL (has no administration in time range)   acetaminophen (TYLENOL) suppository 650 mg (650 mg Rectal Given 08/16/20 1246)  ceFEPIme (MAXIPIME) 2 g in sodium chloride 0.9 % 100 mL IVPB (0 g Intravenous Stopped 08/16/20 1445)  vancomycin (VANCOREADY) IVPB 1500 mg/300 mL (1,500 mg Intravenous New Bag/Given 08/16/20 1335)  clindamycin (CLEOCIN) IVPB 300 mg (0 mg Intravenous Stopped 08/16/20 1445)  iohexol (OMNIPAQUE) 350 MG/ML injection 100 mL (100 mLs Intravenous Contrast Given 08/16/20 1708)    ED Course  I have reviewed the triage vital signs and the nursing notes.  Pertinent labs & imaging results that were available during my care of the patient were reviewed by me and considered in my medical decision making (see chart for details).  Clinical Course as of Aug 17 1707  Mon Aug 16, 2020  1348 Attempted to reach daughter both on mobile and home phone.  Unsuccessful.  Reviewed prior CODE STATUS in chart and patient is DNR.   [MB]    Clinical Course User Index [MB] Hayden Rasmussen, MD   MDM Rules/Calculators/A&P                         [name] was evaluated in Emergency Department on 08/16/2020 for the symptoms described in the history of present illness. She was evaluated in the context of the global COVID-19 pandemic, which necessitated consideration that the patient might be at risk for infection with the SARS-CoV-2 virus that causes COVID-19. Institutional protocols and algorithms that pertain to the evaluation of patients at risk for COVID-19 are in a state of rapid change based on information released by regulatory bodies including the CDC and federal and state organizations. These policies and algorithms were followed during the patient's care in the ED.  This patient complains of congestion, fever, tachypnea; this involves an extensive number of treatment Options and is a complaint that carries with it a high risk of complications and Morbidity. The differential includes Covid, pneumonia, pneumothorax, sepsis,  acidosis, metabolic derangement, UTI  I ordered, reviewed and interpreted labs, which included CBC with elevated white count, stable hemoglobin, chemistries with elevated sodium elevated creatinine reflecting some dehydration, lactate normal, coags fairly normal, urinalysis showing hematuria without obvious signs of infection, Covid testing negative, BNP mildly elevated I ordered medication IV fluids, Tylenol, empiric antibiotics I ordered imaging studies which included chest x-ray and I independently    visualized and interpreted imaging which showed no acute infiltrates Additional history obtained from EMS Previous records obtained and reviewed in epic, it appears that she is minimally verbal at baseline Critical interventions-treatment of sepsis with IV fluids and antibiotics  After the interventions stated above, I reevaluated the patient and found to be somewhat more interactive.  She remains tachypneic.  Blood pressure was over 100.  Signed out to oncoming provider Dr. Karle Starch to follow-up on CT imaging and proceed with admission.   Final Clinical Impression(s) / ED Diagnoses Final diagnoses:  Sepsis with acute hypoxic respiratory failure without septic shock, due to unspecified organism [facility])    Rx / DC Orders ED Discharge Orders    None  Hayden Rasmussen, MD 08/16/20 [phone]

## 2020-08-17 ENCOUNTER — Inpatient Hospital Stay (HOSPITAL_COMMUNITY): Payer: Medicare Other

## 2020-08-17 DIAGNOSIS — I10 Essential (primary) hypertension: Secondary | ICD-10-CM | POA: Diagnosis not present

## 2020-08-17 DIAGNOSIS — F0281 Dementia in other diseases classified elsewhere with behavioral disturbance: Secondary | ICD-10-CM

## 2020-08-17 DIAGNOSIS — G20 Parkinson's disease: Secondary | ICD-10-CM | POA: Diagnosis not present

## 2020-08-17 DIAGNOSIS — I5032 Chronic diastolic (congestive) heart failure: Secondary | ICD-10-CM | POA: Diagnosis not present

## 2020-08-17 DIAGNOSIS — Z7189 Other specified counseling: Secondary | ICD-10-CM | POA: Diagnosis not present

## 2020-08-17 DIAGNOSIS — I4891 Unspecified atrial fibrillation: Secondary | ICD-10-CM | POA: Diagnosis not present

## 2020-08-17 DIAGNOSIS — R7881 Bacteremia: Secondary | ICD-10-CM

## 2020-08-17 DIAGNOSIS — Z515 Encounter for palliative care: Secondary | ICD-10-CM

## 2020-08-17 DIAGNOSIS — A419 Sepsis, unspecified organism: Secondary | ICD-10-CM

## 2020-08-17 LAB — CBC
HCT: 36.8 % (ref 36.0–46.0)
Hemoglobin: 10.6 g/dL — ABNORMAL LOW (ref 12.0–15.0)
MCH: 22.5 pg — ABNORMAL LOW (ref 26.0–34.0)
MCHC: 28.8 g/dL — ABNORMAL LOW (ref 30.0–36.0)
MCV: 78 fL — ABNORMAL LOW (ref 80.0–100.0)
Platelets: 170 10*3/uL (ref 150–400)
RBC: 4.72 MIL/uL (ref 3.87–5.11)
RDW: 18.6 % — ABNORMAL HIGH (ref 11.5–15.5)
WBC: 16.7 10*3/uL — ABNORMAL HIGH (ref 4.0–10.5)
nRBC: 0 % (ref 0.0–0.2)

## 2020-08-17 LAB — BASIC METABOLIC PANEL
Anion gap: 11 (ref 5–15)
BUN: 25 mg/dL — ABNORMAL HIGH (ref 8–23)
CO2: 23 mmol/L (ref 22–32)
Calcium: 8.4 mg/dL — ABNORMAL LOW (ref 8.9–10.3)
Chloride: 113 mmol/L — ABNORMAL HIGH (ref 98–111)
Creatinine, Ser: 0.75 mg/dL (ref 0.44–1.00)
GFR, Estimated: >60 mL/min (ref >60)
Glucose, Bld: 171 mg/dL — ABNORMAL HIGH (ref 70–99)
Potassium: 3.8 mmol/L (ref 3.5–5.1)
Sodium: 147 mmol/L — ABNORMAL HIGH (ref 135–145)

## 2020-08-17 LAB — BLOOD CULTURE ID PANEL (REFLEXED) - BCID2

## 2020-08-17 LAB — ECHOCARDIOGRAM COMPLETE
Area-P 1/2: 2.7 cm2
Height: 61 in
S' Lateral: 2 cm
Weight: 2497.37 oz

## 2020-08-17 MED ORDER — AMIODARONE HCL IN DEXTROSE 360-4.14 MG/200ML-% IV SOLN
60.0000 mg/h | INTRAVENOUS | Status: AC
  Administered 2020-08-17: 60 mg/h via INTRAVENOUS
  Filled 2020-08-17: qty 200

## 2020-08-17 MED ORDER — BUDESONIDE 0.5 MG/2ML IN SUSP
0.5000 mg | Freq: Two times a day (BID) | RESPIRATORY_TRACT | Status: DC
  Administered 2020-08-17 – 2020-08-19 (×5): 0.5 mg via RESPIRATORY_TRACT
  Filled 2020-08-17 (×5): qty 2

## 2020-08-17 MED ORDER — AMIODARONE HCL IN DEXTROSE 360-4.14 MG/200ML-% IV SOLN
30.0000 mg/h | INTRAVENOUS | Status: DC
  Administered 2020-08-17 (×2): 30 mg/h via INTRAVENOUS
  Filled 2020-08-17 (×4): qty 200

## 2020-08-17 MED ORDER — CHLORHEXIDINE GLUCONATE CLOTH 2 % EX PADS
6.0000 | MEDICATED_PAD | Freq: Every day | CUTANEOUS | Status: DC
  Administered 2020-08-17 – 2020-08-19 (×3): 6 via TOPICAL

## 2020-08-17 MED ORDER — LEVALBUTEROL HCL 0.63 MG/3ML IN NEBU
0.6300 mg | INHALATION_SOLUTION | Freq: Four times a day (QID) | RESPIRATORY_TRACT | Status: AC
  Administered 2020-08-17: 0.63 mg via RESPIRATORY_TRACT
  Filled 2020-08-17: qty 3

## 2020-08-17 MED ORDER — FLUTICASONE PROPIONATE 50 MCG/ACT NA SUSP
1.0000 | Freq: Every day | NASAL | Status: DC
  Administered 2020-08-17 – 2020-08-19 (×3): 1 via NASAL
  Filled 2020-08-17 (×2): qty 16

## 2020-08-17 MED ORDER — POLYVINYL ALCOHOL 1.4 % OP SOLN
1.0000 [drp] | OPHTHALMIC | Status: DC | PRN
  Administered 2020-08-17 – 2020-08-19 (×2): 1 [drp] via OPHTHALMIC
  Filled 2020-08-17 (×2): qty 15

## 2020-08-17 NOTE — ED Notes (Signed)
CRITICAL VALUE ALERT  Critical Value:  Gram + Cocci in aerobic and anaerobic bottles  Date & Time Notied:  08/17/2020 3662  Provider Notified: Dr. Dyann Kief  Orders Received/Actions taken: see chart

## 2020-08-17 NOTE — Progress Notes (Signed)
*  PRELIMINARY RESULTS* Echocardiogram 2D Echocardiogram has been performed.  [name] 08/17/2020, 11:14 AM

## 2020-08-17 NOTE — Progress Notes (Signed)
PROGRESS NOTE    [name]  DXA:[id number] DOB: [birth year] DOA: 08/16/2020 PCP: Iona Beard, MD   Chief complaint: Worsening mentation and increased congestion.  Brief Narrative:  As per H&P written by Dr. Denton Brick on 08/16/2020 [name] is a 86 y.o. female with medical history significant for diastolic CHF, hypertension, Parkinson's disease, dementia with behavioral disturbance. Patient was brought to the ED via EMS with reports of congestion.  On arrival to the ED, patient's was unresponsive.  ED provider and myself were unable to contact patient's family.  ED Course: Temperature 103.1, heart rate 60s to 80s, tachypneic to 42, blood pressure systolic ranging from 89 - 110s.  O2 sats 90% on 2 L.  Placed on 4 L.  Lactic acid 1.8.  WBC 20.4.  BNP elevated 178.  UA rare bacteria.  Without acute abnormality, showed colonic diverticulosis without diverticulitis.  Creatinine 1.05, elevated.  Patient was started on broad-spectrum antibiotics Vanco and cefepime.  2.25 L bolus given. Hospitalist to admit for sepsis.  Assessment & Plan: 1-severe sepsis [facility]): In the setting of gram-positive cocci bacteremia -Present on admission -Continue current IV broad-spectrum antibiotics (vancomycin and meropenem in order). -Complete fluid resuscitation and follow 2D echo results. -Follow final cultures speciation and sensitivity results; important to narrow antibiotics appropriately.  2-Dementia with behavioral disturbance (HCC) -Continue constant reorientation and supportive care -Patient at high risk for hospital-acquired delirium.  3-Parkinson disease ([place]) -Continue supportive care -Will resume home medications when able to tolerate by mouth.  4-Essential hypertension -Overall stable and well-controlled -On presentation blood pressure was soft and patient's antihypertensive regimen has been hold -Will follow vital signs and resume antihypertensive agents as needed.  5-COPD exacerbation  -Positive rhonchi and wheeze appreciated on examination -Continue nebulizer management, steroids and wean oxygen supplementation to room air as tolerated.  6-new onset A. fib with RVR -Good response to amiodarone -Will check 2D echo and titrate amiodarone off.  7-Chronic diastolic CHF (congestive heart failure) (HCC) -overall compensated -Continue to follow daily weights and strict I's and O's.   DVT prophylaxis: Lovenox Code Status: DNR Family Communication: No family at bedside. Disposition:   Status is: Inpatient   Dispo: The patient is from: Home              Anticipated d/c is to: To be determined              Anticipated d/c date is: To be determined              Patient currently no medically stable for discharge; receiving treatment for severe sepsis in the setting of bacteremia and acute on chronic COPD exacerbation.  Continue to wean oxygen supplementation as tolerated; continue steroids, nebulizer management, IV fluids and supportive care.  Will continue amiodarone and wean off as tolerated and will check 2D echo.  Patient overall prognosis is guarded; palliative care has been consulted for advance care planning.    Consultants:   Palliative care  Procedures:  See below for x-ray report 2D echo: Pending  Antimicrobials:  Meropenem and vancomycin  Subjective: Afebrile currently; not following commands and just opening her eyes to voice.  No nausea or vomiting reported.  Overnight with new development of A. fib with RVR.  Objective: Vitals:   08/17/20 0600 08/17/20 0630 08/17/20 0715 08/17/20 0821  BP: 98/60 (!) 106/94 120/80   Pulse: 77 77 70   Resp: (!) 25 (!) 24 19   Temp: 99.9 F (37.7 C) 99.7 F (37.6 C)  99.5 F (37.5 C)   TempSrc:      SpO2: 98% 96% 100% 100%  Weight:      Height:        Intake/Output Summary (Last 24 hours) at 08/17/2020 0854 Last data filed at 08/16/2020 1625 Gross per 24 hour  Intake 2200 ml  Output -  Net 2200 ml    Filed Weights   08/16/20 1232 08/16/20 2300  Weight: 70.8 kg 70.8 kg    Examination:  General exam: Currently afebrile; no following commands.  Patient open her eyes to voice.  Upper airway congestion and mild gurgling sound appreciated. Respiratory system: Fair air movement bilaterally; positive rhonchi and mild expiratory wheezing.  No using accessory muscle. Cardiovascular system: S1 & S2 heard, RRR. No JVD, rubs, gallops or clicks.  Trace lower extremity edema appreciated bilaterally. Gastrointestinal system: Abdomen is nondistended, soft and nontender. No organomegaly or masses felt. Normal bowel sounds heard. Central nervous system: Unable to properly assess with patient underlying dementia and current mental status. Extremities: No cyanosis or clubbing. Skin: No rashes, lesions or ulcers Psychiatry: Mood & affect appropriate.     Data Reviewed: I have personally reviewed following labs and imaging studies  CBC: Recent Labs  Lab 08/16/20 1300 08/17/20 0819  WBC 20.4* 16.7*  NEUTROABS 18.0*  --   HGB 12.1 10.6*  HCT 40.2 36.8  MCV 76.3* 78.0*  PLT 325 242    Basic Metabolic Panel: Recent Labs  Lab 08/16/20 1300 08/17/20 0819  NA 146* 147*  K 3.4* 3.8  CL 110 113*  CO2 26 23  GLUCOSE 167* 171*  BUN 21 25*  CREATININE 1.05* 0.75  CALCIUM 8.6* 8.4*    GFR: Estimated Creatinine Clearance: 35.3 mL/min (by C-G formula based on SCr of 0.75 mg/dL).  Liver Function Tests: Recent Labs  Lab 08/16/20 1300  AST 32  ALT 24  ALKPHOS 72  BILITOT 0.5  PROT 8.6*  ALBUMIN 3.1*    CBG: No results for input(s): GLUCAP in the last 168 hours.   Recent Results (from the past 240 hour(s))  Blood Culture (routine x 2)     Status: None (Preliminary result)   Collection Time: 08/16/20  1:00 PM   Specimen: Blood  Result Value Ref Range Status   Specimen Description   Final    RIGHT ANTECUBITAL BOTTLES DRAWN AEROBIC AND ANAEROBIC   Special Requests   Final    Blood  Culture adequate volume Performed at [facility], [address], [place], [place] [address]    Culture PENDING  Incomplete   Report Status PENDING  Incomplete  Blood Culture (routine x 2)     Status: None (Preliminary result)   Collection Time: 08/16/20  1:05 PM   Specimen: BLOOD RIGHT WRIST  Result Value Ref Range Status   Specimen Description   Final    BLOOD RIGHT WRIST BOTTLES DRAWN AEROBIC AND ANAEROBIC   Special Requests Blood Culture adequate volume  Final   Culture  Setup Time   Final    GRAM POSITIVE COCCI IN BOTH AEROBIC AND ANAEROBIC BOTTLES Gram Stain Report Called to,Read Back By and Verified With: MICHAEL DOSS @0830  08/17/20 BY JONES,T [facility] Performed at [facility], [address] [place], [place] [address]    Culture PENDING  Incomplete   Report Status PENDING  Incomplete  Resp Panel by RT-PCR (Flu A&B, Covid) Nasopharyngeal Swab     Status: None   Collection Time: 08/16/20  3:17 PM   Specimen: Nasopharyngeal Swab;  Nasopharyngeal(NP) swabs in vial transport medium  Result Value Ref Range Status   SARS Coronavirus 2 by RT PCR NEGATIVE NEGATIVE Final    Comment: (NOTE) SARS-CoV-2 target nucleic acids are NOT DETECTED.  The SARS-CoV-2 RNA is generally detectable in upper respiratory specimens during the acute phase of infection. The lowest concentration of SARS-CoV-2 viral copies this assay can detect is 138 copies/mL. A negative result does not preclude SARS-Cov-2 infection and should not be used as the sole basis for treatment or other patient management decisions. A negative result may occur with  improper specimen collection/handling, submission of specimen other than nasopharyngeal swab, presence of viral mutation(s) within the areas targeted by this assay, and inadequate number of viral copies(<138 copies/mL). A negative result must be combined with clinical observations, patient history, and epidemiological information. The expected result is Negative.   Fact Sheet for Patients:  EntrepreneurPulse.com.au  Fact Sheet for Healthcare Providers:  IncredibleEmployment.be  This test is no t yet approved or cleared by the [place] FDA and  has been authorized for detection and/or diagnosis of SARS-CoV-2 by FDA under an Emergency Use Authorization (EUA). This EUA will remain  in effect (meaning this test can be used) for the duration of the COVID-19 declaration under Section 564(b)(1) of the Act, 21 U.S.C.section 360bbb-3(b)(1), unless the authorization is terminated  or revoked sooner.       Influenza A by PCR NEGATIVE NEGATIVE Final   Influenza B by PCR NEGATIVE NEGATIVE Final    Comment: (NOTE) The Xpert Xpress SARS-CoV-2/FLU/RSV plus assay is intended as an aid in the diagnosis of influenza from Nasopharyngeal swab specimens and should not be used as a sole basis for treatment. Nasal washings and aspirates are unacceptable for Xpert Xpress SARS-CoV-2/FLU/RSV testing.  Fact Sheet for Patients: EntrepreneurPulse.com.au  Fact Sheet for Healthcare Providers: IncredibleEmployment.be  This test is not yet approved or cleared by the [place] FDA and has been authorized for detection and/or diagnosis of SARS-CoV-2 by FDA under an Emergency Use Authorization (EUA). This EUA will remain in effect (meaning this test can be used) for the duration of the COVID-19 declaration under Section 564(b)(1) of the Act, 21 U.S.C. section 360bbb-3(b)(1), unless the authorization is terminated or revoked.  Performed at [facility], [address], [place], [place] [address]     Radiology Studies: CT Head Wo Contrast  Result Date: 08/16/2020 CLINICAL DATA:  Altered level of consciousness, sepsis, tachypnea EXAM: CT HEAD WITHOUT CONTRAST TECHNIQUE: Contiguous axial images were obtained from the base of the skull through the vertex without intravenous contrast. COMPARISON:   10/18/2017 FINDINGS: Brain: No acute infarct or hemorrhage. Lateral ventricles and midline structures are stable. 2.5 x 2.2 cm mass within the anterior aspect of the right middle cranial fossa not appreciably changed since prior MRI dating to 2019, most consistent with meningioma. No significant mass effect. No acute extra-axial fluid collections. Vascular: No hyperdense vessel or unexpected calcification. Skull: Normal. Negative for fracture or focal lesion. Sinuses/Orbits: Diffuse mucosal thickening throughout the paranasal sinuses, most pronounced within the sphenoid sinuses. Other: None. IMPRESSION: 1. No acute intracranial process. 2. Probable meningioma right middle cranial fossa, without significant change since 2019. Electronically Signed   By: Randa Ngo M.D.   On: 08/16/2020 17:34   CT Angio Chest PE W/Cm &/Or Wo Cm  Result Date: 08/16/2020 CLINICAL DATA:  Altered level of consciousness, sepsis, tachypnea EXAM: CT ANGIOGRAPHY CHEST CT ABDOMEN AND PELVIS WITH CONTRAST TECHNIQUE: Multidetector CT imaging of the chest was performed  using the standard protocol during bolus administration of intravenous contrast. Multiplanar CT image reconstructions and MIPs were obtained to evaluate the vascular anatomy. Multidetector CT imaging of the abdomen and pelvis was performed using the standard protocol during bolus administration of intravenous contrast. CONTRAST:  159mL OMNIPAQUE IOHEXOL 350 MG/ML SOLN COMPARISON:  08/16/2020, 09/17/2013 FINDINGS: CTA CHEST FINDINGS Cardiovascular: This is a technically adequate evaluation of the pulmonary vasculature. No filling defects or pulmonary emboli. The heart is enlarged with prominent left atrial dilatation. No pericardial effusion. Mild atherosclerosis of the aorta and coronary vessels. Mediastinum/Nodes: No enlarged mediastinal, hilar, or axillary lymph nodes. Thyroid gland, trachea, and esophagus demonstrate no significant findings. Lungs/Pleura: Dependent  hypoventilatory changes are seen bilaterally. No acute airspace disease, effusion, or pneumothorax. Musculoskeletal: No acute or destructive bony lesions. Chronic appearing T9 compression deformity. Reconstructed images demonstrate no additional findings. Review of the MIP images confirms the above findings. CT ABDOMEN and PELVIS FINDINGS Hepatobiliary: Numerous hepatic cysts are again identified, without significant change since prior study. Gallbladder is unremarkable. Pancreas: Unremarkable. No pancreatic ductal dilatation or surrounding inflammatory changes. Spleen: Normal in size without focal abnormality. Adrenals/Urinary Tract: Bilateral renal cortical atrophy. Stable left renal cortical cyst. The adrenals are unremarkable. Bladder is decompressed with a Foley catheter. Stomach/Bowel: No bowel obstruction or ileus. Minimal descending colonic diverticulosis without diverticulitis. No bowel wall thickening or inflammatory change. Vascular/Lymphatic: Aortic atherosclerosis. No enlarged abdominal or pelvic lymph nodes. Reproductive: Status post hysterectomy. No adnexal masses. Other: No free fluid or free gas. No abdominal wall hernia. Musculoskeletal: No acute or destructive bony lesions. Chronic L1 compression deformity. Reconstructed images demonstrate no additional findings. Review of the MIP images confirms the above findings. IMPRESSION: 1. No evidence of pulmonary embolus. 2. Cardiomegaly with prominent left atrial dilatation. 3. No acute intra-abdominal or intrapelvic process. 4. Minimal descending colonic diverticulosis without diverticulitis. 5. Aortic Atherosclerosis (ICD10-I70.0). Electronically Signed   By: Randa Ngo M.D.   On: 08/16/2020 17:44   CT Abdomen Pelvis W Contrast  Result Date: 08/16/2020 CLINICAL DATA:  Altered level of consciousness, sepsis, tachypnea EXAM: CT ANGIOGRAPHY CHEST CT ABDOMEN AND PELVIS WITH CONTRAST TECHNIQUE: Multidetector CT imaging of the chest was performed  using the standard protocol during bolus administration of intravenous contrast. Multiplanar CT image reconstructions and MIPs were obtained to evaluate the vascular anatomy. Multidetector CT imaging of the abdomen and pelvis was performed using the standard protocol during bolus administration of intravenous contrast. CONTRAST:  195mL OMNIPAQUE IOHEXOL 350 MG/ML SOLN COMPARISON:  08/16/2020, 09/17/2013 FINDINGS: CTA CHEST FINDINGS Cardiovascular: This is a technically adequate evaluation of the pulmonary vasculature. No filling defects or pulmonary emboli. The heart is enlarged with prominent left atrial dilatation. No pericardial effusion. Mild atherosclerosis of the aorta and coronary vessels. Mediastinum/Nodes: No enlarged mediastinal, hilar, or axillary lymph nodes. Thyroid gland, trachea, and esophagus demonstrate no significant findings. Lungs/Pleura: Dependent hypoventilatory changes are seen bilaterally. No acute airspace disease, effusion, or pneumothorax. Musculoskeletal: No acute or destructive bony lesions. Chronic appearing T9 compression deformity. Reconstructed images demonstrate no additional findings. Review of the MIP images confirms the above findings. CT ABDOMEN and PELVIS FINDINGS Hepatobiliary: Numerous hepatic cysts are again identified, without significant change since prior study. Gallbladder is unremarkable. Pancreas: Unremarkable. No pancreatic ductal dilatation or surrounding inflammatory changes. Spleen: Normal in size without focal abnormality. Adrenals/Urinary Tract: Bilateral renal cortical atrophy. Stable left renal cortical cyst. The adrenals are unremarkable. Bladder is decompressed with a Foley catheter. Stomach/Bowel: No bowel obstruction or ileus. Minimal descending colonic diverticulosis  without diverticulitis. No bowel wall thickening or inflammatory change. Vascular/Lymphatic: Aortic atherosclerosis. No enlarged abdominal or pelvic lymph nodes. Reproductive: Status post  hysterectomy. No adnexal masses. Other: No free fluid or free gas. No abdominal wall hernia. Musculoskeletal: No acute or destructive bony lesions. Chronic L1 compression deformity. Reconstructed images demonstrate no additional findings. Review of the MIP images confirms the above findings. IMPRESSION: 1. No evidence of pulmonary embolus. 2. Cardiomegaly with prominent left atrial dilatation. 3. No acute intra-abdominal or intrapelvic process. 4. Minimal descending colonic diverticulosis without diverticulitis. 5. Aortic Atherosclerosis (ICD10-I70.0). Electronically Signed   By: Randa Ngo M.D.   On: 08/16/2020 17:44   DG Chest Port 1 View  Result Date: 08/17/2020 CLINICAL DATA:  Shortness of breath EXAM: PORTABLE CHEST 1 VIEW COMPARISON:  Film from the previous day FINDINGS: Cardiac shadow is stable. Aortic calcifications are again seen. Lungs are well aerated bilaterally. No focal infiltrate or sizable effusion is seen. The atelectatic changes seen on prior CT examination are not well appreciated on today's exam. IMPRESSION: No acute abnormality noted. Atelectatic changes seen on yesterday's CT are not well appreciated on this exam. Electronically Signed   By: Inez Catalina M.D.   On: 08/17/2020 03:14   DG Chest Port 1 View  Result Date: 08/16/2020 CLINICAL DATA:  Possible sepsis EXAM: PORTABLE CHEST 1 VIEW COMPARISON:  03/18/2019 FINDINGS: Low lung volumes. Mild chronic interstitial prominence. No definite new consolidation. No pleural effusion. Similar cardiomediastinal contours with cardiomegaly. IMPRESSION: No acute process in the chest. Electronically Signed   By: Macy Mis M.D.   On: 08/16/2020 13:18    Scheduled Meds: . enoxaparin (LOVENOX) injection  40 mg Subcutaneous Q24H  . levalbuterol  0.63 mg Nebulization Q6H  . methylPREDNISolone (SOLU-MEDROL) injection  60 mg Intravenous Q12H   Continuous Infusions: . amiodarone 60 mg/hr (08/17/20 0352)   Followed by  . amiodarone     . lactated ringers    . meropenem (MERREM) IV    . [START ON 08/18/2020] vancomycin       LOS: 1 day    Time spent: 35 minutes.    Barton Dubois, MD [facility]   To contact the attending provider between 7A-7P or the covering provider during after hours 7P-7A, please log into the web site [email or web address] and access using universal [place] password for that web site. If you do not have the password, please call the hospital operator.  08/17/2020, 8:54 AM

## 2020-08-17 NOTE — ED Notes (Signed)
Pt's HR increased from 60's to 130's. EKG captured and MD paged.

## 2020-08-17 NOTE — Progress Notes (Addendum)
RN called due to patient's increased HR, it was noted that the HR increased after DuoNeb treatment.  Patient was asymptomatic and was in no acute distress DuoNeb was stopped and Xopenex was started. Continue to monitor patient and treat accordingly  3:20 AM Patient went into paroxysmal A. fib with RVR Patient will be started on IV amiodarone drip without a loading dose due to patient's soft BP Repeated chest x-ray done showed no acute abnormality noted.  Atelectatic changes seen on prior CT examination and not well appreciated on current chest x-ray. We shall continue to monitor patient and treat accordingly.

## 2020-08-17 NOTE — Progress Notes (Signed)
Brief note: full note to follow.   Palliative consult complete. Patient's HCPOA requests full scope treatment with goals being for patient to return home.  Daughter requesting for patient to be put on diet and allowed to eat. Patient accepted ice chips from daughter with much encouragement- appeared to swallow without overt aspiration- but I am concerned about silent aspiration. Will order SLP consult- per daughter patient is at baseline mental status.   Mariana Kaufman, AGNP-C Palliative Medicine  No charge note

## 2020-08-17 NOTE — Consult Note (Signed)
Consultation Note Date: 08/17/2020   Patient Name: [name]  DOB: 03-Aug-1920  MRN: [id number]  Age / Sex: 86 y.o., female  PCP: Iona Beard, MD Referring Physician: Barton Dubois, MD  Reason for Consultation: Establishing goals of care  HPI/Patient Profile: 86 y.o. female  with past medical history of CHF, hypertension, Parkinson's dementia, admitted on 08/16/2020 with sepsis bacteremia with source unknown.  On admission she was febrile tachypneic, hypotensive, and unresponsive.  Daughter had called EMS reporting congestion. Head chest abdomen and pelvis CAT scans did not show any definitive source of infection.  Treatment was started with empiric antibiotics with one blood culture growing gram-positive cocci in both aerobic and anaerobic bottles.  BNP was also elevated at 478. during the night she had new onset of paroxysmal A. fib with RVR and was started on IV amiodarone.  Palliative medicine consulted for goals of care.  Patient has DNR in place.  Clinical Assessment and Goals of Care: Reviewed patient's chart and met at bedside with her daughter [name].  Patient lives at home with her daughter.  She is bed and chair bound, dependent for all ADLs, daughter uses a Harrel Lemon to lift her from the bed to the chair each day.  Patient does not speak or open her eyes very often, however she does enjoy spending time with her family. Care is provided by her daughter and by home aids.  Per daughter patient has not had decreased appetite and eats well. I discussed patient's current illness and answered patient's daughter's questions related to her diagnosis and possible treatments. Her daughter was very concerned about [name] eating.  I retrieved ice chips and made attempts to place them in [name]'s mouth however she held her mouth tightly shut.  Her daughter was able to push the spoon with ice cubes on it  into [name]'s mouth at which time [name] did receive the ice chips and did not show overt signs of aspiration, however she had a weak cough and there was some concern for possible silent aspiration.  I discussed with patient's daughter why the patient had n.p.o. status and recommended a speech evaluation for safety of swallowing eval.  Patient's daughter expressed that patient had never had problems eating before, however she did note that patient would pocket food.  I attempted to provide health education related to how illness can affect mental status which can affect safety of swallowing, at which time [name] told me that patient was at her baseline mental status. When discussing goals of care [name] was very reluctant to discuss anything related to end-of-life.  She believes that to do that would be talking it into existence.  Her goals of care are for patient to be treated and returned home with her.  She declined to discuss other advanced care planning anticipatory care decisions such as feeding tube.  Primary Decision Maker NEXT OF KIN    SUMMARY OF RECOMMENDATIONS -Continue full scope care, patient has DNR order in place -SLP to  evaluate for swallow safety -PMT will follow peripherally and chart check for further intervention as needed  Code Status/Advance Care Planning:  DNR  Palliative Prophylaxis:   Aspiration  Prognosis:    Unable to determine  Discharge Planning: To Be Determined  Primary Diagnoses: Present on Admission: . Severe sepsis ([place]) . Dementia with behavioral disturbance ([place]) . Parkinson disease ([place]) . Essential hypertension . Chronic diastolic CHF (congestive heart failure) ([place])   I have reviewed the medical record, interviewed the patient and family, and examined the patient. The following aspects are pertinent.  Past Medical History:  Diagnosis Date  . Alzheimer's dementia ([place])   . Cancer (H[facility])    breast  . CHF (congestive heart  failure) ([place])   . Heart attack ([place])   . Hypertension   . Parkinson disease [facility])    Social History   Socioeconomic History  . Marital status: Widowed    Spouse name: Not on file  . Number of children: Not on file  . Years of education: Not on file  . Highest education level: Not on file  Occupational History  . Occupation: Retired and disabled  Tobacco Use  . Smoking status: Never Smoker  . Smokeless tobacco: Never Used  Vaping Use  . Vaping Use: Never used  Substance and Sexual Activity  . Alcohol use: No  . Drug use: No  . Sexual activity: Never  Other Topics Concern  . Not on file  Social History Narrative   Pt is totally dependent upon her daughter [name] who is very dedicated. Pt has a [employer] worker 5 days a week for 8 hours a day. [name] drives and is able to take her mother to appts and get her medications.   Social Determinants of Health   Financial Resource Strain:   . Difficulty of Paying Living Expenses: Not on file  Food Insecurity:   . Worried About Charity fundraiser in the Last Year: Not on file  . Ran Out of Food in the Last Year: Not on file  Transportation Needs:   . Lack of Transportation (Medical): Not on file  . Lack of Transportation (Non-Medical): Not on file  Physical Activity:   . Days of Exercise per Week: Not on file  . Minutes of Exercise per Session: Not on file  Stress:   . Feeling of Stress : Not on file  Social Connections:   . Frequency of Communication with Friends and Family: Not on file  . Frequency of Social Gatherings with Friends and Family: Not on file  . Attends Religious Services: Not on file  . Active Member of Clubs or Organizations: Not on file  . Attends Archivist Meetings: Not on file  . Marital Status: Not on file   Scheduled Meds: . budesonide (PULMICORT) nebulizer solution  0.5 mg Nebulization BID  . Chlorhexidine Gluconate Cloth  6 each Topical Daily  . enoxaparin (LOVENOX) injection   40 mg Subcutaneous Q24H  . fluticasone  1 spray Each Nare Daily  . methylPREDNISolone (SOLU-MEDROL) injection  60 mg Intravenous Q12H   Continuous Infusions: . amiodarone 30 mg/hr (08/17/20 1036)  . lactated ringers    . meropenem (MERREM) IV 1 g (08/17/20 1152)  . [START ON 08/18/2020] vancomycin     PRN Meds:.acetaminophen **OR** acetaminophen, ondansetron **OR** ondansetron (ZOFRAN) IV, polyvinyl alcohol Medications Prior to Admission:  Prior to Admission medications   Medication Sig Start Date End Date Taking? Authorizing Provider  acetaminophen (TYLENOL) 500 MG  tablet Take 500 mg by mouth at bedtime. For pain   Yes [provider]  ALPRAZolam (XANAX) 0.5 MG tablet Take 0.5 mg by mouth 3 (three) times daily.    Yes [provider]  aspirin EC 81 MG tablet Take 1 tablet (81 mg total) by mouth daily. 04/05/13  Yes Kathie Dike, MD  benztropine (COGENTIN) 1 MG tablet Take 1 tablet (1 mg total) by mouth daily. 04/05/13  Yes Kathie Dike, MD  citalopram (CELEXA) 20 MG tablet Take 1 tablet (20 mg total) by mouth every morning. 04/05/13  Yes Kathie Dike, MD  ferrous sulfate 325 (65 FE) MG EC tablet Take 1 tablet (325 mg total) by mouth daily. 03/20/19 08/16/20 Yes Kathie Dike, MD  furosemide (LASIX) 20 MG tablet Take 1 tablet (20 mg total) by mouth daily. 04/30/18 08/16/20 Yes Erline Hau, MD  ipratropium-albuterol (DUONEB) 0.5-2.5 (3) MG/3ML SOLN Take 3 mLs by nebulization 4 (four) times daily. 09/28/18  Yes Barton Dubois, MD  metoprolol (LOPRESSOR) 50 MG tablet Take 0.5 tablets (25 mg total) by mouth 2 (two) times daily. 09/24/13  Yes Kathie Dike, MD  pantoprazole (PROTONIX) 40 MG tablet Take 1 tablet (40 mg total) by mouth daily. 03/20/19 08/16/20 Yes Kathie Dike, MD  rivastigmine (EXELON) 9.5 mg/24hr Place 1 patch (9.5 mg total) onto the skin daily. *Remove and discard used patches* 04/05/13  Yes Memon, Jolaine Artist, MD  zolpidem (AMBIEN) 10 MG tablet  Take 0.5 tablets (5 mg total) by mouth at bedtime as needed for sleep. 03/20/19  Yes Kathie Dike, MD  loratadine (CLARITIN) 10 MG tablet Take 1 tablet (10 mg total) by mouth daily as needed for allergies. Patient not taking: Reported on 08/16/2020 09/28/18 09/28/19  Barton Dubois, MD  oxyCODONE (OXY IR/ROXICODONE) 5 MG immediate release tablet Take 1 tablet (5 mg total) by mouth every 12 (twelve) hours as needed for severe pain. Patient not taking: Reported on 08/16/2020 03/20/19   Kathie Dike, MD   Allergies  Allergen Reactions  . Adhesive [Tape] Rash  . Ensure Pudding [Nutritional Supplements] Swelling and Rash    Throat swelling  . Penicillins Swelling and Rash    Has patient had a PCN reaction causing immediate rash, facial/tongue/throat swelling, SOB or lightheadedness with hypotension: Yes Has patient had a PCN reaction causing severe rash involving mucus membranes or skin necrosis: Yes Has patient had a PCN reaction that required hospitalization No Has patient had a PCN reaction occurring within the last 10 years: No If all of the above answers are "NO", then may proceed with Cephalosporin use.   Review of Systems  Unable to perform ROS: Dementia    Physical Exam Vitals and nursing note reviewed.  Constitutional:      Appearance: Normal appearance.     Comments: Lethargic  HENT:     Mouth/Throat:     Mouth: Mucous membranes are dry.     Pharynx: No oropharyngeal exudate.  Cardiovascular:     Rate and Rhythm: Rhythm irregular.  Pulmonary:     Comments: Wheezing, audible secretions Neurological:     Comments: Does not follow commands, mumbles incomprehensively at times      Vital Signs: BP (!) 134/91   Pulse 67   Temp 99.1 F (37.3 C) (Bladder)   Resp (!) 29   Ht _0  (1.549 m)   Wt 68.8 kg   SpO2 98%   BMI 28.66 kg/m  Pain Scale: 0-10       SpO2: SpO2: 98 %  O2 Device:SpO2: 98 % O2 Flow Rate: .O2 Flow Rate (L/min): 4 L/min  IO: Intake/output  summary:   Intake/Output Summary (Last 24 hours) at 08/17/2020 1714 Last data filed at 08/17/2020 1134 Gross per 24 hour  Intake --  Output 775 ml  Net -775 ml    LBM: Last BM Date:  (PTA) Baseline Weight: Weight: 70.8 kg Most recent weight: Weight: 68.8 kg     Palliative Assessment/Data: PPS: 10%     Thank you for this consult. Palliative medicine will continue to follow and assist as needed.   Time In: 1433 Time Out: 1553 Time Total: 73 mins Greater than 50%  of this time was spent counseling and coordinating care related to the above assessment and plan.  Signed by: Mariana Kaufman, AGNP-C Palliative Medicine    Please contact Palliative Medicine Team phone at [phone] for questions and concerns.  For individual provider: See Shea Evans

## 2020-08-17 NOTE — ED Notes (Signed)
MD notified of HR and recent EKG.

## 2020-08-18 DIAGNOSIS — A419 Sepsis, unspecified organism: Secondary | ICD-10-CM | POA: Diagnosis not present

## 2020-08-18 DIAGNOSIS — R6520 Severe sepsis without septic shock: Secondary | ICD-10-CM | POA: Diagnosis not present

## 2020-08-18 LAB — CREATININE, SERUM
Creatinine, Ser: 0.8 mg/dL (ref 0.44–1.00)
GFR, Estimated: >60 mL/min (ref >60)

## 2020-08-18 LAB — URINE CULTURE: Culture: <10000 — AB

## 2020-08-18 LAB — PROCALCITONIN: Procalcitonin: 1 ng/mL

## 2020-08-18 MED ORDER — CITALOPRAM HYDROBROMIDE 20 MG PO TABS
20.0000 mg | ORAL_TABLET | Freq: Every morning | ORAL | Status: DC
  Administered 2020-08-19: 20 mg via ORAL
  Filled 2020-08-18: qty 1

## 2020-08-18 MED ORDER — AMIODARONE HCL 200 MG PO TABS
200.0000 mg | ORAL_TABLET | Freq: Every day | ORAL | Status: DC
  Administered 2020-08-18 – 2020-08-19 (×2): 200 mg via ORAL
  Filled 2020-08-18 (×2): qty 1

## 2020-08-18 MED ORDER — DEXTROSE 5 % IV SOLN: INTRAVENOUS | Status: DC

## 2020-08-18 MED ORDER — ZOLPIDEM TARTRATE 5 MG PO TABS: 5.0000 mg | ORAL_TABLET | Freq: Every evening | ORAL | Status: DC | PRN

## 2020-08-18 MED ORDER — RIVASTIGMINE 9.5 MG/24HR TD PT24
9.5000 mg | MEDICATED_PATCH | Freq: Every day | TRANSDERMAL | Status: DC
  Administered 2020-08-18 – 2020-08-19 (×2): 9.5 mg via TRANSDERMAL
  Filled 2020-08-18 (×3): qty 1

## 2020-08-18 MED ORDER — BENZTROPINE MESYLATE 1 MG PO TABS
1.0000 mg | ORAL_TABLET | Freq: Every day | ORAL | Status: DC
  Administered 2020-08-18 – 2020-08-19 (×2): 1 mg via ORAL
  Filled 2020-08-18 (×2): qty 1

## 2020-08-18 MED ORDER — ASPIRIN EC 81 MG PO TBEC
81.0000 mg | DELAYED_RELEASE_TABLET | Freq: Every day | ORAL | Status: DC
  Administered 2020-08-18 – 2020-08-19 (×2): 81 mg via ORAL
  Filled 2020-08-18 (×2): qty 1

## 2020-08-18 MED ORDER — METHYLPREDNISOLONE SODIUM SUCC 40 MG IJ SOLR
40.0000 mg | Freq: Two times a day (BID) | INTRAMUSCULAR | Status: DC
  Administered 2020-08-18 – 2020-08-19 (×2): 40 mg via INTRAVENOUS
  Filled 2020-08-18 (×2): qty 1

## 2020-08-18 NOTE — Progress Notes (Signed)
PROGRESS NOTE    [name]  YIR:[id number] DOB: [birth year] DOA: 08/16/2020 PCP: Iona Beard, MD   Brief Narrative:  As per H&P written by Dr. Denton Brick on 08/16/2020 Samantha Corporal Cobbis a 86 y.o.femalewith medical history significant fordiastolic CHF, hypertension, Parkinson's disease, dementia with behavioral disturbance. Patient was brought to the ED via EMS with reports of congestion. On arrival to the ED, patient's was unresponsive.ED provider and myself were unable to contact patient's family.  ED Course:Temperature 103.1, heart rate 60s to 80s, tachypneic to 42, blood pressure systolicranging from 89 - 110s.O2 sats 90% on 2 L. Placed on 4 L. Lactic acid 1.8. WBC 20.4. BNP elevated 178. UA rare bacteria. Without acute abnormality, showed colonic diverticulosis without diverticulitis. Creatinine 1.05, elevated. Patient was started on broad-spectrum antibiotics Vanco and cefepime. 2.25 L bolus given. Hospitalist to admit for sepsis.  Assessment & Plan:   Principal Problem:   Severe sepsis ([place]) Active Problems:   Dementia with behavioral disturbance (HCC)   Parkinson disease (HCC)   Essential hypertension   Chronic diastolic CHF (congestive heart failure) (HCC)   1-severe sepsis (HCC)-resolving -Present on admission -Unknown if true sepsis present, with no definitive source.  Blood culture with gram-positive bacteremia in 1 set of cultures and likely contaminant -Repeat blood cultures today -Transthoracic 2D echocardiogram without signs of vegetations and LVEF 60-65% and grade 2 diastolic dysfunction -Check procalcitonin -Plan to discontinue IV antibiotics today  2-Dementia with behavioral disturbance (HCC) -Continue constant reorientation and supportive care -Patient at high risk for hospital-acquired delirium. -With dysphagia and started on dysphagia 1 diet per SLP  3-Parkinson disease ([place]) -Continue supportive care -Will resume home medications when  able to tolerate by mouth.  4-Essential hypertension -Overall stable and well-controlled -On presentation blood pressure was soft and patient's antihypertensive regimen has been hold -Will follow vital signs and resume antihypertensive agents as needed.  5-COPD exacerbation-improving -Positive rhonchi and wheeze appreciated on examination -Continue nebulizer management, steroids and wean oxygen supplementation to room air as tolerated. -Wean steroids  6-new onset A. fib with RVR-improved -Good response to IV amiodarone which will be transitioned to p.o. today (crushed)  7-Chronic diastolic CHF (congestive heart failure) (HCC) -overall compensated -Continue to follow daily weights and strict I's and O's.  DVT prophylaxis:Lovenox Code Status: DNR Family Communication: Discussed with daughter at bedside 12/1 Disposition Plan:  Status is: Inpatient  Remains inpatient appropriate because:IV treatments appropriate due to intensity of illness or inability to take PO and Inpatient level of care appropriate due to severity of illness   Dispo: The patient is from: Home              Anticipated d/c is to: Home              Anticipated d/c date is: 1 day              Patient currently is not medically stable to d/c.   Consultants:  Palliative Care  Procedures:   See below  Antimicrobials:  Anti-infectives (From admission, onward)   Start     Dose/Rate Route Frequency Ordered Stop   08/18/20 1300  vancomycin (VANCOREADY) IVPB 1250 mg/250 mL  Status:  Discontinued        1,250 mg 166.7 mL/hr over 90 Minutes Intravenous Every 48 hours 08/16/20 1409 08/18/20 1410   08/17/20 0300  ceFEPIme (MAXIPIME) 2 g in sodium chloride 0.9 % 100 mL IVPB  Status:  Discontinued        2 g 200  mL/hr over 30 Minutes Intravenous Every 12 hours 08/16/20 1404 08/16/20 2304   08/16/20 2330  meropenem (MERREM) 1 g in sodium chloride 0.9 % 100 mL IVPB  Status:  Discontinued        1 g 200 mL/hr over  30 Minutes Intravenous Every 12 hours 08/16/20 2328 08/18/20 1410   08/16/20 1830  metroNIDAZOLE (FLAGYL) IVPB 500 mg  Status:  Discontinued        500 mg 100 mL/hr over 60 Minutes Intravenous Every 8 hours 08/16/20 1826 08/16/20 2304   08/16/20 1430  vancomycin (VANCOREADY) IVPB 1500 mg/300 mL        1,500 mg 150 mL/hr over 120 Minutes Intravenous  Once 08/16/20 1303 08/16/20 1848   08/16/20 1400  metroNIDAZOLE (FLAGYL) tablet 500 mg  Status:  Discontinued        500 mg Oral Every 8 hours 08/16/20 1300 08/16/20 1336   08/16/20 1345  clindamycin (CLEOCIN) IVPB 300 mg        300 mg 100 mL/hr over 30 Minutes Intravenous  Once 08/16/20 1336 08/16/20 1445   08/16/20 1315  ceFEPIme (MAXIPIME) 2 g in sodium chloride 0.9 % 100 mL IVPB        2 g 200 mL/hr over 30 Minutes Intravenous  Once 08/16/20 1300 08/16/20 1445   08/16/20 1315  vancomycin (VANCOCIN) IVPB 1000 mg/200 mL premix  Status:  Discontinued        1,000 mg 200 mL/hr over 60 Minutes Intravenous  Once 08/16/20 1300 08/16/20 1303       Subjective: Patient seen and evaluated today with no acute overnight events and concerns noted.  Daughter is at bedside stating that patient has had her usual baseline.  She is concerned about her not being able to eat.  Objective: Vitals:   08/18/20 1130 08/18/20 1200 08/18/20 1230 08/18/20 1300  BP: (!) 216/197  (!) 132/117 137/83  Pulse: 72 68 66 67  Resp: 19 (!) 27 20 (!) 26  Temp: (!) 100.4 F (38 C) (!) 100.4 F (38 C) 100.2 F (37.9 C) 100 F (37.8 C)  TempSrc:      SpO2: 98% 98% 96% 99%  Weight:      Height:        Intake/Output Summary (Last 24 hours) at 08/18/2020 1416 Last data filed at 08/18/2020 0600 Gross per 24 hour  Intake 723.52 ml  Output 350 ml  Net 373.52 ml   Filed Weights   08/16/20 2300 08/17/20 1131 08/18/20 0500  Weight: 70.8 kg 68.8 kg 68.6 kg    Examination:  General exam: Appears calm and comfortable  Respiratory system: Clear to auscultation.  Respiratory effort normal.  Currently on 2 L nasal cannula oxygen. Cardiovascular system: S1 & S2 heard, RRR.  Holosystolic murmur. Gastrointestinal system: Abdomen is minimally distended, soft and nontender.  Central nervous system: Somnolent but arousable Extremities: No significant edema Skin: No rashes, lesions or ulcers Psychiatry: Cannot be assessed given patient condition.    Data Reviewed: I have personally reviewed following labs and imaging studies  CBC: Recent Labs  Lab 08/16/20 1300 08/17/20 0819  WBC 20.4* 16.7*  NEUTROABS 18.0*  --   HGB 12.1 10.6*  HCT 40.2 36.8  MCV 76.3* 78.0*  PLT 325 856   Basic Metabolic Panel: Recent Labs  Lab 08/16/20 1300 08/17/20 0819 08/18/20 0541  NA 146* 147*  --   K 3.4* 3.8  --   CL 110 113*  --   CO2 26 23  --  GLUCOSE 167* 171*  --   BUN 21 25*  --   CREATININE 1.05* 0.75 0.80  CALCIUM 8.6* 8.4*  --    GFR: Estimated Creatinine Clearance: 34.8 mL/min (by C-G formula based on SCr of 0.8 mg/dL). Liver Function Tests: Recent Labs  Lab 08/16/20 1300  AST 32  ALT 24  ALKPHOS 72  BILITOT 0.5  PROT 8.6*  ALBUMIN 3.1*   No results for input(s): LIPASE, AMYLASE in the last 168 hours. No results for input(s): AMMONIA in the last 168 hours. Coagulation Profile: Recent Labs  Lab 08/16/20 1300  INR 1.2   Cardiac Enzymes: No results for input(s): CKTOTAL, CKMB, CKMBINDEX, TROPONINI in the last 168 hours. BNP (last 3 results) No results for input(s): PROBNP in the last 8760 hours. HbA1C: No results for input(s): HGBA1C in the last 72 hours. CBG: No results for input(s): GLUCAP in the last 168 hours. Lipid Profile: No results for input(s): CHOL, HDL, LDLCALC, TRIG, CHOLHDL, LDLDIRECT in the last 72 hours. Thyroid Function Tests: No results for input(s): TSH, T4TOTAL, FREET4, T3FREE, THYROIDAB in the last 72 hours. Anemia Panel: No results for input(s): VITAMINB12, FOLATE, FERRITIN, TIBC, IRON, RETICCTPCT in the  last 72 hours. Sepsis Labs: Recent Labs  Lab 08/16/20 1300  LATICACIDVEN 1.8    Recent Results (from the past 240 hour(s))  Blood Culture (routine x 2)     Status: None (Preliminary result)   Collection Time: 08/16/20  1:00 PM   Specimen: Right Antecubital; Blood  Result Value Ref Range Status   Specimen Description   Final    RIGHT ANTECUBITAL BOTTLES DRAWN AEROBIC AND ANAEROBIC   Special Requests Blood Culture adequate volume  Final   Culture   Final    NO GROWTH < 24 HOURS Performed at [facility], [address] [place], [place] [address]    Report Status PENDING  Incomplete  Blood Culture (routine x 2)     Status: None (Preliminary result)   Collection Time: 08/16/20  1:05 PM   Specimen: BLOOD RIGHT WRIST  Result Value Ref Range Status   Specimen Description   Final    BLOOD RIGHT WRIST BOTTLES DRAWN AEROBIC AND ANAEROBIC Performed at [facility], [address], [place], [place] [address]    Special Requests   Final    Blood Culture adequate volume Performed at [facility], [address], [place], [place] [address]    Culture  Setup Time   Final    GRAM POSITIVE COCCI IN BOTH AEROBIC AND ANAEROBIC BOTTLES Gram Stain Report Called to,Read Back By and Verified With: MICHAEL DOSS @0830  08/17/20 BY JONES,T [facility] Organism ID to follow    Culture   Final    GRAM POSITIVE COCCI IDENTIFICATION TO FOLLOW Performed at [facility], [place] [address], [place], [place] [address]    Report Status PENDING  Incomplete  Blood Culture ID Panel (Reflexed)     Status: Abnormal   Collection Time: 08/16/20  1:05 PM  Result Value Ref Range Status   Enterococcus faecalis NOT DETECTED NOT DETECTED Final   Enterococcus Faecium NOT DETECTED NOT DETECTED Final   Listeria monocytogenes NOT DETECTED NOT DETECTED Final   Staphylococcus species DETECTED (A) NOT DETECTED Final    Comment: CRITICAL RESULT CALLED TO, READ BACK BY AND VERIFIED WITH: Gloris Manchester PharmD 14:30 08/17/20  ([email or web address])    Staphylococcus aureus (BCID) NOT DETECTED NOT DETECTED Final   Staphylococcus epidermidis NOT DETECTED NOT DETECTED Final   Staphylococcus lugdunensis NOT  DETECTED NOT DETECTED Final   Streptococcus species NOT DETECTED NOT DETECTED Final   Streptococcus agalactiae NOT DETECTED NOT DETECTED Final   Streptococcus pneumoniae NOT DETECTED NOT DETECTED Final   Streptococcus pyogenes NOT DETECTED NOT DETECTED Final   A.calcoaceticus-baumannii NOT DETECTED NOT DETECTED Final   Bacteroides fragilis NOT DETECTED NOT DETECTED Final   Enterobacterales NOT DETECTED NOT DETECTED Final   Enterobacter cloacae complex NOT DETECTED NOT DETECTED Final   Escherichia coli NOT DETECTED NOT DETECTED Final   Klebsiella aerogenes NOT DETECTED NOT DETECTED Final   Klebsiella oxytoca NOT DETECTED NOT DETECTED Final   Klebsiella pneumoniae NOT DETECTED NOT DETECTED Final   Proteus species NOT DETECTED NOT DETECTED Final   Salmonella species NOT DETECTED NOT DETECTED Final   Serratia marcescens NOT DETECTED NOT DETECTED Final   Haemophilus influenzae NOT DETECTED NOT DETECTED Final   Neisseria meningitidis NOT DETECTED NOT DETECTED Final   Pseudomonas aeruginosa NOT DETECTED NOT DETECTED Final   Stenotrophomonas maltophilia NOT DETECTED NOT DETECTED Final   Candida albicans NOT DETECTED NOT DETECTED Final   Candida auris NOT DETECTED NOT DETECTED Final   Candida glabrata NOT DETECTED NOT DETECTED Final   Candida krusei NOT DETECTED NOT DETECTED Final   Candida parapsilosis NOT DETECTED NOT DETECTED Final   Candida tropicalis NOT DETECTED NOT DETECTED Final   Cryptococcus neoformans/gattii NOT DETECTED NOT DETECTED Final    Comment: Performed at [facility], [address], [place], [place] [address]  Urine culture     Status: Abnormal   Collection Time: 08/16/20  2:49 PM   Specimen: Urine, Catheterized  Result Value Ref Range Status   Specimen Description   Final    URINE, CATHETERIZED  Performed at [facility], [address] [place], [place] [address]    Special Requests   Final    NONE Performed at [facility], [address] [place], [place] [address]    Culture (A)  Final    <10,000 COLONIES/mL INSIGNIFICANT GROWTH Performed at [place] [address], [place], [place] [address]    Report Status 08/18/2020 FINAL  Final  Resp Panel by RT-PCR (Flu A&B, Covid) Nasopharyngeal Swab     Status: None   Collection Time: 08/16/20  3:17 PM   Specimen: Nasopharyngeal Swab; Nasopharyngeal(NP) swabs in vial transport medium  Result Value Ref Range Status   SARS Coronavirus 2 by RT PCR NEGATIVE NEGATIVE Final    Comment: (NOTE) SARS-CoV-2 target nucleic acids are NOT DETECTED.  The SARS-CoV-2 RNA is generally detectable in upper respiratory specimens during the acute phase of infection. The lowest concentration of SARS-CoV-2 viral copies this assay can detect is 138 copies/mL. A negative result does not preclude SARS-Cov-2 infection and should not be used as the sole basis for treatment or other patient management decisions. A negative result may occur with  improper specimen collection/handling, submission of specimen other than nasopharyngeal swab, presence of viral mutation(s) within the areas targeted by this assay, and inadequate number of viral copies(<138 copies/mL). A negative result must be combined with clinical observations, patient history, and epidemiological information. The expected result is Negative.  Fact Sheet for Patients:  EntrepreneurPulse.com.au  Fact Sheet for Healthcare Providers:  IncredibleEmployment.be  This test is no t yet approved or cleared by the [place] FDA and  has been authorized for detection and/or diagnosis of SARS-CoV-2 by FDA under an Emergency Use Authorization (EUA). This EUA will remain  in effect (meaning this test can be used)  for the duration of the COVID-19  declaration under Section 564(b)(1) of the Act, 21 U.S.C.section 360bbb-3(b)(1), unless the authorization is terminated  or revoked sooner.       Influenza A by PCR NEGATIVE NEGATIVE Final   Influenza B by PCR NEGATIVE NEGATIVE Final    Comment: (NOTE) The Xpert Xpress SARS-CoV-2/FLU/RSV plus assay is intended as an aid in the diagnosis of influenza from Nasopharyngeal swab specimens and should not be used as a sole basis for treatment. Nasal washings and aspirates are unacceptable for Xpert Xpress SARS-CoV-2/FLU/RSV testing.  Fact Sheet for Patients: EntrepreneurPulse.com.au  Fact Sheet for Healthcare Providers: IncredibleEmployment.be  This test is not yet approved or cleared by the [place] FDA and has been authorized for detection and/or diagnosis of SARS-CoV-2 by FDA under an Emergency Use Authorization (EUA). This EUA will remain in effect (meaning this test can be used) for the duration of the COVID-19 declaration under Section 564(b)(1) of the Act, 21 U.S.C. section 360bbb-3(b)(1), unless the authorization is terminated or revoked.  Performed at [facility], [address] [place], [place] [address]   Culture, blood (routine x 2)     Status: None (Preliminary result)   Collection Time: 08/18/20  9:04 AM   Specimen: BLOOD  Result Value Ref Range Status   Specimen Description BLOOD BLOOD RIGHT HAND  Final   Special Requests   Final    BOTTLES DRAWN AEROBIC AND ANAEROBIC Blood Culture adequate volume Performed at [facility], [address] [place], [place] [address]    Culture PENDING  Incomplete   Report Status PENDING  Incomplete         Radiology Studies: CT Head Wo Contrast  Result Date: 08/16/2020 CLINICAL DATA:  Altered level of consciousness, sepsis, tachypnea EXAM: CT HEAD WITHOUT CONTRAST TECHNIQUE: Contiguous axial images were obtained from the base of the skull through the vertex without intravenous  contrast. COMPARISON:  10/18/2017 FINDINGS: Brain: No acute infarct or hemorrhage. Lateral ventricles and midline structures are stable. 2.5 x 2.2 cm mass within the anterior aspect of the right middle cranial fossa not appreciably changed since prior MRI dating to 2019, most consistent with meningioma. No significant mass effect. No acute extra-axial fluid collections. Vascular: No hyperdense vessel or unexpected calcification. Skull: Normal. Negative for fracture or focal lesion. Sinuses/Orbits: Diffuse mucosal thickening throughout the paranasal sinuses, most pronounced within the sphenoid sinuses. Other: None. IMPRESSION: 1. No acute intracranial process. 2. Probable meningioma right middle cranial fossa, without significant change since 2019. Electronically Signed   By: Randa Ngo M.D.   On: 08/16/2020 17:34   CT Angio Chest PE W/Cm &/Or Wo Cm  Result Date: 08/16/2020 CLINICAL DATA:  Altered level of consciousness, sepsis, tachypnea EXAM: CT ANGIOGRAPHY CHEST CT ABDOMEN AND PELVIS WITH CONTRAST TECHNIQUE: Multidetector CT imaging of the chest was performed using the standard protocol during bolus administration of intravenous contrast. Multiplanar CT image reconstructions and MIPs were obtained to evaluate the vascular anatomy. Multidetector CT imaging of the abdomen and pelvis was performed using the standard protocol during bolus administration of intravenous contrast. CONTRAST:  172mL OMNIPAQUE IOHEXOL 350 MG/ML SOLN COMPARISON:  08/16/2020, 09/17/2013 FINDINGS: CTA CHEST FINDINGS Cardiovascular: This is a technically adequate evaluation of the pulmonary vasculature. No filling defects or pulmonary emboli. The heart is enlarged with prominent left atrial dilatation. No pericardial effusion. Mild atherosclerosis of the aorta and coronary vessels. Mediastinum/Nodes: No enlarged mediastinal, hilar, or axillary lymph nodes. Thyroid gland, trachea, and esophagus demonstrate no significant findings.  Lungs/Pleura: Dependent hypoventilatory changes are seen bilaterally. No acute airspace disease, effusion, or pneumothorax. Musculoskeletal: No acute or destructive bony lesions. Chronic appearing T9 compression deformity. Reconstructed images demonstrate no additional findings. Review of the MIP images confirms the above findings. CT ABDOMEN and PELVIS FINDINGS Hepatobiliary: Numerous hepatic cysts are again identified, without significant change since prior study. Gallbladder is unremarkable. Pancreas: Unremarkable. No pancreatic ductal dilatation or surrounding inflammatory changes. Spleen: Normal in size without focal abnormality. Adrenals/Urinary Tract: Bilateral renal cortical atrophy. Stable left renal cortical cyst. The adrenals are unremarkable. Bladder is decompressed with a Foley catheter. Stomach/Bowel: No bowel obstruction or ileus. Minimal descending colonic diverticulosis without diverticulitis. No bowel wall thickening or inflammatory change. Vascular/Lymphatic: Aortic atherosclerosis. No enlarged abdominal or pelvic lymph nodes. Reproductive: Status post hysterectomy. No adnexal masses. Other: No free fluid or free gas. No abdominal wall hernia. Musculoskeletal: No acute or destructive bony lesions. Chronic L1 compression deformity. Reconstructed images demonstrate no additional findings. Review of the MIP images confirms the above findings. IMPRESSION: 1. No evidence of pulmonary embolus. 2. Cardiomegaly with prominent left atrial dilatation. 3. No acute intra-abdominal or intrapelvic process. 4. Minimal descending colonic diverticulosis without diverticulitis. 5. Aortic Atherosclerosis (ICD10-I70.0). Electronically Signed   By: Randa Ngo M.D.   On: 08/16/2020 17:44   CT Abdomen Pelvis W Contrast  Result Date: 08/16/2020 CLINICAL DATA:  Altered level of consciousness, sepsis, tachypnea EXAM: CT ANGIOGRAPHY CHEST CT ABDOMEN AND PELVIS WITH CONTRAST TECHNIQUE: Multidetector CT imaging of  the chest was performed using the standard protocol during bolus administration of intravenous contrast. Multiplanar CT image reconstructions and MIPs were obtained to evaluate the vascular anatomy. Multidetector CT imaging of the abdomen and pelvis was performed using the standard protocol during bolus administration of intravenous contrast. CONTRAST:  127mL OMNIPAQUE IOHEXOL 350 MG/ML SOLN COMPARISON:  08/16/2020, 09/17/2013 FINDINGS: CTA CHEST FINDINGS Cardiovascular: This is a technically adequate evaluation of the pulmonary vasculature. No filling defects or pulmonary emboli. The heart is enlarged with prominent left atrial dilatation. No pericardial effusion. Mild atherosclerosis of the aorta and coronary vessels. Mediastinum/Nodes: No enlarged mediastinal, hilar, or axillary lymph nodes. Thyroid gland, trachea, and esophagus demonstrate no significant findings. Lungs/Pleura: Dependent hypoventilatory changes are seen bilaterally. No acute airspace disease, effusion, or pneumothorax. Musculoskeletal: No acute or destructive bony lesions. Chronic appearing T9 compression deformity. Reconstructed images demonstrate no additional findings. Review of the MIP images confirms the above findings. CT ABDOMEN and PELVIS FINDINGS Hepatobiliary: Numerous hepatic cysts are again identified, without significant change since prior study. Gallbladder is unremarkable. Pancreas: Unremarkable. No pancreatic ductal dilatation or surrounding inflammatory changes. Spleen: Normal in size without focal abnormality. Adrenals/Urinary Tract: Bilateral renal cortical atrophy. Stable left renal cortical cyst. The adrenals are unremarkable. Bladder is decompressed with a Foley catheter. Stomach/Bowel: No bowel obstruction or ileus. Minimal descending colonic diverticulosis without diverticulitis. No bowel wall thickening or inflammatory change. Vascular/Lymphatic: Aortic atherosclerosis. No enlarged abdominal or pelvic lymph nodes.  Reproductive: Status post hysterectomy. No adnexal masses. Other: No free fluid or free gas. No abdominal wall hernia. Musculoskeletal: No acute or destructive bony lesions. Chronic L1 compression deformity. Reconstructed images demonstrate no additional findings. Review of the MIP images confirms the above findings. IMPRESSION: 1. No evidence of pulmonary embolus. 2. Cardiomegaly with prominent left atrial dilatation. 3. No acute intra-abdominal or intrapelvic process. 4. Minimal descending colonic diverticulosis without diverticulitis. 5. Aortic Atherosclerosis (ICD10-I70.0). Electronically Signed   By: Randa Ngo M.D.   On: 08/16/2020 17:44   DG Chest [facility]  1 View  Result Date: 08/17/2020 CLINICAL DATA:  Shortness of breath EXAM: PORTABLE CHEST 1 VIEW COMPARISON:  Film from the previous day FINDINGS: Cardiac shadow is stable. Aortic calcifications are again seen. Lungs are well aerated bilaterally. No focal infiltrate or sizable effusion is seen. The atelectatic changes seen on prior CT examination are not well appreciated on today's exam. IMPRESSION: No acute abnormality noted. Atelectatic changes seen on yesterday's CT are not well appreciated on this exam. Electronically Signed   By: Inez Catalina M.D.   On: 08/17/2020 03:14   ECHOCARDIOGRAM COMPLETE  Result Date: 08/17/2020    ECHOCARDIOGRAM REPORT   Patient Name:   [name] Date of Exam: 08/17/2020 Medical Rec #:  [id number]    Height:       61.0 in Accession #:    [id number]   Weight:       156.1 lb Date of Birth:  [birth year]    BSA:          1.700 m Patient Age:    7 years     BP:           94/78 mmHg Patient Gender: F            HR:           69 bpm. Exam Location:  [facility] Procedure: 2D Echo, Cardiac Doppler and Color Doppler Indications:    Atrial Fibrillation 427.31 / I48.91  History:        Patient has prior history of Echocardiogram examinations, most                 recent 01/07/2015. CHF, Previous Myocardial Infarction; Risk                  Factors:Hypertension. Dementia with behavioral disturbance,                 Parkinson disease, Cancer ([place]) (From Hx).  Sonographer:    Alvino Chapel RCS Referring Phys: [phone] CARLOS MADERA  Sonographer Comments: Could NOT obtain IVC or Suprasternal windows IMPRESSIONS  1. Left ventricular ejection fraction, by estimation, is 60 to 65%. The left ventricle has normal function. The left ventricle has no regional wall motion abnormalities. There is severe left ventricular hypertrophy. Left ventricular diastolic parameters  are consistent with Grade II diastolic dysfunction (pseudonormalization).  2. Right ventricular systolic function is normal. The right ventricular size is normal.  3. Left atrial size was severely dilated.  4. The mitral valve is grossly normal. Mild to moderate mitral valve regurgitation.  5. The aortic valve is tricuspid. Aortic valve regurgitation is trivial. Mild aortic valve sclerosis is present, with no evidence of aortic valve stenosis.  6. There is a trivial pericardial effusion. Pericardial adipose tissue also noted.  7. Large hepatic cyst noted. FINDINGS  Left Ventricle: Left ventricular ejection fraction, by estimation, is 60 to 65%. The left ventricle has normal function. The left ventricle has no regional wall motion abnormalities. The left ventricular internal cavity size was normal in size. There is  severe left ventricular hypertrophy. Left ventricular diastolic parameters are consistent with Grade II diastolic dysfunction (pseudonormalization). Right Ventricle: The right ventricular size is normal. No increase in right ventricular wall thickness. Right ventricular systolic function is normal. Left Atrium: Left atrial size was severely dilated. Right Atrium: Right atrial size was normal in size. Pericardium: Trivial pericardial effusion is present. Presence of pericardial fat pad. Mitral Valve: The mitral valve is grossly normal. Mild mitral annular calcification.  Mild to  moderate mitral valve regurgitation. Tricuspid Valve: The tricuspid valve is grossly normal. Tricuspid valve regurgitation is mild. Aortic Valve: The aortic valve is tricuspid. There is moderate aortic valve annular calcification. Aortic valve regurgitation is trivial. Mild aortic valve sclerosis is present, with no evidence of aortic valve stenosis. Pulmonic Valve: The pulmonic valve was not well visualized. Pulmonic valve regurgitation is trivial. Aorta: The aortic root is normal in size and structure. IAS/Shunts: No atrial level shunt detected by color flow Doppler.  LEFT VENTRICLE PLAX 2D LVIDd:         2.70 cm  Diastology LVIDs:         2.00 cm  LV e' medial:    3.05 cm/s LV PW:         1.70 cm  LV E/e' medial:  34.1 LV IVS:        1.80 cm  LV e' lateral:   4.03 cm/s LVOT diam:     1.50 cm  LV E/e' lateral: 25.8 LV SV:         48 LV SV Index:   28 LVOT Area:     1.77 cm  RIGHT VENTRICLE RV S prime:     7.29 cm/s TAPSE (M-mode): 1.2 cm LEFT ATRIUM             Index       RIGHT ATRIUM           Index LA diam:        4.70 cm 2.76 cm/m  RA Area:     14.00 cm LA Vol (A2C):   75.7 ml 44.53 ml/m RA Volume:   33.70 ml  19.82 ml/m LA Vol (A4C):   96.9 ml 57.00 ml/m LA Biplane Vol: 90.8 ml 53.42 ml/m  AORTIC VALVE LVOT Vmax:   134.00 cm/s LVOT Vmean:  89.600 cm/s LVOT VTI:    0.271 m  AORTA Ao Root diam: 2.80 cm MITRAL VALVE                TRICUSPID VALVE MV Area (PHT): 2.70 cm     TR Peak grad:   25.0 mmHg MV Decel Time: 281 msec     TR Vmax:        250.00 cm/s MV E velocity: 104.00 cm/s MV A velocity: 89.20 cm/s   SHUNTS MV E/A ratio:  1.17         Systemic VTI:  0.27 m                             Systemic Diam: 1.50 cm Rozann Lesches MD Electronically signed by Rozann Lesches MD Signature Date/Time: 08/17/2020/3:37:17 PM    Final         Scheduled Meds: . amiodarone  200 mg Oral Daily  . budesonide (PULMICORT) nebulizer solution  0.5 mg Nebulization BID  . Chlorhexidine Gluconate Cloth  6 each  Topical Daily  . enoxaparin (LOVENOX) injection  40 mg Subcutaneous Q24H  . fluticasone  1 spray Each Nare Daily  . methylPREDNISolone (SOLU-MEDROL) injection  40 mg Intravenous Q12H   Continuous Infusions: . dextrose    . lactated ringers       LOS: 2 days    Time spent: 35 minutes    Niamya Vittitow Darleen Crocker, DO [facility]  If 7PM-7AM, please contact night-coverage [email or web address] 08/18/2020, 2:16 PM

## 2020-08-18 NOTE — Evaluation (Signed)
Clinical/Bedside Swallow Evaluation Patient Details  Name: [name] MRN: [id number] Date of Birth: 1920-06-23  Today's Date: 08/18/2020 Time: SLP Start Time (ACUTE ONLY): 1214 SLP Stop Time (ACUTE ONLY): 1239 SLP Time Calculation (min) (ACUTE ONLY): 25 min  Past Medical History:  Past Medical History:  Diagnosis Date  . Alzheimer's dementia ([place])   . Cancer (H[facility])    breast  . CHF (congestive heart failure) ([place])   . Heart attack ([place])   . Hypertension   . Parkinson disease [facility])    Past Surgical History:  Past Surgical History:  Procedure Laterality Date  . ABDOMINAL HYSTERECTOMY    . arm surgery    . BREAST SURGERY    . CHOLECYSTECTOMY    . COLON SURGERY    . HIP PINNING,CANNULATED Right 02/01/2013   Procedure: CANNULATED HIP PINNING;  Surgeon: Sanjuana Kava, MD;  Location: [facility];  Service: Orthopedics;  Laterality: Right;  . LEG SURGERY    . MASTECTOMY    . PELVIC FRACTURE SURGERY    . small intestine removed    . TONSILLECTOMY     HPI:  86 y.o. female  with past medical history of CHF, hypertension, Parkinson's dementia, admitted on 08/16/2020 with sepsis bacteremia with source unknown.  On admission she was febrile tachypneic, hypotensive, and unresponsive.  Daughter had called EMS reporting congestion. Head chest abdomen and pelvis CAT scans did not show any definitive source of infection.  Treatment was started with empiric antibiotics with one blood culture growing gram-positive cocci in both aerobic and anaerobic bottles.  BNP was also elevated at 478. during the night she had new onset of paroxysmal A. fib with RVR and was started on IV amiodarone.  Palliative medicine consulted for goals of care.  Patient has DNR in place. BSE requested.   Assessment / Plan / Recommendation Clinical Impression  Clinical swallow evaluation completed at bedside with daughter/caregiver present. Her daughter stated that she does not think that her mother has trouble swallowing and she  thinks that she is currently sick from being exposed to paint fumes near her home. Her daughter indicates that eating is important to her mother and that she enjoys her foods. She typically eats "soft foods" at home which include: oatmeal, cream potatoes, applesauce, and mashed cooked apples and meatloaf. She drinks her liquids from a cup with some sort of spout/straw. Pt's eyes were closed throughout the evaluation, however she responded to spoon, cup, and straw to lips indicating some form of po readiness. Pt's daughter stated that her mother does not like cold liquids or ice chips. Pt accepted single ice chips with eventual perceived swallow (palpation). Pt with reduced labial closure around the cup for cup sips of water which resulted in some labial spillage. Pt was able to suck water from the straw and nearly consumed 120 ml via sequential swallows before SLP removed. Pt with occasional delayed congested cough after straw sips of water. SLP provided education regarding increased risks for aspiration in individuals with dementia, advanced age, dependent feeder status, and inability to communicate needs. Suspect episodic aspiration possibly after the swallow from residuals, however no way to know for certain without objective assessment (also likely with thickened liquids). Pt's daughter is not interested in completing MBSS at this time and it does not sound like it would change the course of treatment currently. Recommend offering D1/puree and thin liquids with 100% feeder assist and only feed Pt when she readily accepts po, daughter prefers to feed Pt. Pt's  daughter acknowledges information regarding aspiration, however she verbalized that she does not think this is happening to Pt. Will order D1/puree and thin liquids, po medications crushed as able in puree, feeder assist with aspiration and reflux precautions and SLP to follow during acute stay. Can complete MBSS if desired, however Pt unlikely to take in  much for the evaluation.    SLP Visit Diagnosis: Dysphagia, unspecified (R13.10)    Aspiration Risk  Moderate aspiration risk;Risk for inadequate nutrition/hydration    Diet Recommendation Dysphagia 1 (Puree);Thin liquid   Liquid Administration via: Cup;Straw Medication Administration: Crushed with puree Supervision: Staff to assist with self feeding;Full supervision/cueing for compensatory strategies (daughter will feed Pt) Compensations: Slow rate;Small sips/bites;Monitor for anterior loss Postural Changes: Seated upright at 90 degrees;Remain upright for at least 30 minutes after po intake    Other  Recommendations Oral Care Recommendations: Oral care BID;Staff/trained caregiver to provide oral care Other Recommendations: Clarify dietary restrictions   Follow up Recommendations 24 hour supervision/assistance      Frequency and Duration min 2x/week  1 week       Prognosis Prognosis for Safe Diet Advancement: Guarded Barriers to Reach Goals: Cognitive deficits      Swallow Study   General Date of Onset: 08/16/20 HPI: 86 y.o. female  with past medical history of CHF, hypertension, Parkinson's dementia, admitted on 08/16/2020 with sepsis bacteremia with source unknown.  On admission she was febrile tachypneic, hypotensive, and unresponsive.  Daughter had called EMS reporting congestion. Head chest abdomen and pelvis CAT scans did not show any definitive source of infection.  Treatment was started with empiric antibiotics with one blood culture growing gram-positive cocci in both aerobic and anaerobic bottles.  BNP was also elevated at 478. during the night she had new onset of paroxysmal A. fib with RVR and was started on IV amiodarone.  Palliative medicine consulted for goals of care.  Patient has DNR in place. BSE requested. Type of Study: Bedside Swallow Evaluation Previous Swallow Assessment: BSE 2019 (D3/thin), 2015 (puree/NTL) Diet Prior to this Study: NPO Temperature Spikes  Noted: No Respiratory Status: Room air History of Recent Intubation: No Behavior/Cognition: Alert;Requires cueing;Doesn't follow directions (eyes closed, but responds to spoon/po) Oral Cavity Assessment: Within Functional Limits Oral Care Completed by SLP: Recent completion by staff Oral Cavity - Dentition: Missing dentition;Adequate natural dentition Vision: Impaired for self-feeding Self-Feeding Abilities: Total assist Patient Positioning: Upright in bed Baseline Vocal Quality: Not observed Volitional Cough: Cognitively unable to elicit (reflexive cough was congested) Volitional Swallow: Unable to elicit    Oral/Motor/Sensory Function Overall Oral Motor/Sensory Function: Generalized oral weakness   Ice Chips Ice chips: Impaired Presentation: Spoon Oral Phase Impairments: Reduced labial seal;Reduced lingual movement/coordination (improved with trials)   Thin Liquid Thin Liquid: Impaired Presentation: Cup;Straw Oral Phase Impairments: Reduced labial seal;Reduced lingual movement/coordination Oral Phase Functional Implications: Right anterior spillage Pharyngeal  Phase Impairments: Cough - Delayed (audible swallow)    Nectar Thick Nectar Thick Liquid: Not tested   Honey Thick Honey Thick Liquid: Not tested   Puree Puree: Within functional limits Presentation: Spoon   Solid     Solid: Impaired Presentation: Spoon Oral Phase Impairments: Impaired mastication Oral Phase Functional Implications: Prolonged oral transit     Thank you,  Genene Churn, Horace  Mariem Skolnick 08/18/2020,1:05 PM

## 2020-08-19 DIAGNOSIS — A419 Sepsis, unspecified organism: Secondary | ICD-10-CM | POA: Diagnosis not present

## 2020-08-19 DIAGNOSIS — R6520 Severe sepsis without septic shock: Secondary | ICD-10-CM | POA: Diagnosis not present

## 2020-08-19 LAB — BASIC METABOLIC PANEL
Anion gap: 9 (ref 5–15)
BUN: 38 mg/dL — ABNORMAL HIGH (ref 8–23)
CO2: 25 mmol/L (ref 22–32)
Calcium: 8.5 mg/dL — ABNORMAL LOW (ref 8.9–10.3)
Chloride: 109 mmol/L (ref 98–111)
Creatinine, Ser: 1.09 mg/dL — ABNORMAL HIGH (ref 0.44–1.00)
GFR, Estimated: 46 mL/min — ABNORMAL LOW (ref >60)
Glucose, Bld: 183 mg/dL — ABNORMAL HIGH (ref 70–99)
Potassium: 4.2 mmol/L (ref 3.5–5.1)
Sodium: 143 mmol/L (ref 135–145)

## 2020-08-19 LAB — CULTURE, BLOOD (ROUTINE X 2): Special Requests: ADEQUATE

## 2020-08-19 LAB — CBC
HCT: 36.8 % (ref 36.0–46.0)
Hemoglobin: 10.8 g/dL — ABNORMAL LOW (ref 12.0–15.0)
MCH: 22.4 pg — ABNORMAL LOW (ref 26.0–34.0)
MCHC: 29.3 g/dL — ABNORMAL LOW (ref 30.0–36.0)
MCV: 76.3 fL — ABNORMAL LOW (ref 80.0–100.0)
Platelets: 234 10*3/uL (ref 150–400)
RBC: 4.82 MIL/uL (ref 3.87–5.11)
RDW: 18.1 % — ABNORMAL HIGH (ref 11.5–15.5)
WBC: 10.8 10*3/uL — ABNORMAL HIGH (ref 4.0–10.5)
nRBC: 0.2 % (ref 0.0–0.2)

## 2020-08-19 LAB — MAGNESIUM: Magnesium: 2.4 mg/dL (ref 1.7–2.4)

## 2020-08-19 MED ORDER — GLYCOPYRROLATE 1 MG PO TABS: 1.0000 mg | ORAL_TABLET | Freq: Three times a day (TID) | ORAL | 0 refills | Status: AC

## 2020-08-19 MED ORDER — PREDNISONE 20 MG PO TABS: 40.0000 mg | ORAL_TABLET | Freq: Every day | ORAL | 0 refills | Status: DC

## 2020-08-19 MED ORDER — ENOXAPARIN SODIUM 30 MG/0.3ML ~~LOC~~ SOLN
30.0000 mg | SUBCUTANEOUS | Status: DC
  Administered 2020-08-19: 30 mg via SUBCUTANEOUS
  Filled 2020-08-19: qty 0.3

## 2020-08-19 MED ORDER — AMIODARONE HCL 200 MG PO TABS: 200.0000 mg | ORAL_TABLET | Freq: Every day | ORAL | 3 refills | Status: DC

## 2020-08-19 NOTE — Progress Notes (Signed)
Patient discharged to home with daughter via [facility].

## 2020-08-19 NOTE — Care Management Important Message (Signed)
Important Message  Patient Details  Name: [name] MRN: [id number] Date of Birth: 1920/02/11   Medicare Important Message Given:  Yes     Tommy Medal 08/19/2020, 12:38 PM

## 2020-08-19 NOTE — Discharge Summary (Signed)
Physician Discharge Summary  [name] IHK:[id number] DOB: 1920/01/25 DOA: 08/16/2020  PCP: Iona Beard, MD  Admit date: 08/16/2020  Discharge date: 08/19/2020  Admitted From:Home  Disposition:  Home  Recommendations for Outpatient Follow-up:  1. Follow up with PCP in 1-2 weeks 2. Continue on steroid taper as prescribed and finish within 5 days 3. Continue on other medications as noted below to include amiodarone 4. Continue other home medications with the exception of Lasix until dietary intake improves  Home Health: None  Equipment/Devices: Has home 2 L nasal cannula at bedtime  Discharge Condition: Stable  CODE STATUS: DNR  Diet recommendation: Dysphagia 1 diet  Brief/Interim Summary: As per H&P written by Dr. Denton Brick on 08/16/2020 [name] a 86 y.o.femalewith medical history significant fordiastolic CHF, hypertension, Parkinson's disease, dementia with behavioral disturbance. Patient was brought to the ED via EMS with reports of congestion. On arrival to the ED, patient's was unresponsive.ED provider and myself were unable to contact patient's family.  ED Course:Temperature 103.1, heart rate 60s to 80s, tachypneic to 42, blood pressure systolicranging from 89 - 110s.O2 sats 90% on 2 L. Placed on 4 L. Lactic acid 1.8. WBC 20.4. BNP elevated 178. UA rare bacteria. Without acute abnormality, showed colonic diverticulosis without diverticulitis. Creatinine 1.05, elevated. Patient was started on broad-spectrum antibiotics Vanco and cefepime. 2.25 L bolus given. Hospitalist to admit for sepsis.  -Patient was admitted with severe sepsis on admission, but no definitive source was found.  There was likely some component of aspiration that may have been associated.  She was noted to have 1 set of blood cultures positive for staph hominis that appears to be a contaminant and no other acute findings.  She had 2D echocardiogram with no signs of vegetations and  LVEF 60-65% and grade 2 diastolic dysfunction.  Her leukocytosis is resolving and she does not have any further fevers.  She was also noted to have new onset atrial fibrillation with RVR for which she was started on IV amiodarone and this has been transitioned to oral with good heart rate control noted.  She is not a candidate for any aggressive cardiology interventions or any other significant work-up and truly appears to have a very guarded prognosis over the next few months.  Palliative care was consulted for further discussions and consideration of hospice, but patient's daughter was not agreeable to any further discussions at this time and would like to take her home as it is.  She unfortunately continues to have very poor prognosis and likely will not survive over the next few months.  At this time, she is medically stable for discharge.  Discharge Diagnoses:  Principal Problem:   Severe sepsis ([place]) Active Problems:   Dementia with behavioral disturbance (HCC)   Parkinson disease (HCC)   Essential hypertension   Chronic diastolic CHF (congestive heart failure) ([place])  Principal discharge diagnosis: Aspiration pneumonitis in the setting of likely silent aspiration with associated encephalopathy.  Severe sepsis ruled out.  Discharge Instructions  Discharge Instructions    Ambulatory referral to Urology   Complete by: As directed    Foley catheter void trial.   Diet - low sodium heart healthy   Complete by: As directed    Increase activity slowly   Complete by: As directed    Leave dressing on - Keep it clean, dry, and intact until clinic visit   Complete by: As directed      Allergies as of 08/19/2020      Reactions  Adhesive [tape] Rash   Ensure Pudding [nutritional Supplements] Swelling, Rash   Throat swelling   Penicillins Swelling, Rash   Has patient had a PCN reaction causing immediate rash, facial/tongue/throat swelling, SOB or lightheadedness with hypotension: Yes Has  patient had a PCN reaction causing severe rash involving mucus membranes or skin necrosis: Yes Has patient had a PCN reaction that required hospitalization No Has patient had a PCN reaction occurring within the last 10 years: No If all of the above answers are "NO", then may proceed with Cephalosporin use.      Medication List    STOP taking these medications   ALPRAZolam 0.5 MG tablet Commonly known as: XANAX   furosemide 20 MG tablet Commonly known as: Lasix   oxyCODONE 5 MG immediate release tablet Commonly known as: Oxy IR/ROXICODONE     TAKE these medications   acetaminophen 500 MG tablet Commonly known as: TYLENOL Take 500 mg by mouth at bedtime. For pain   amiodarone 200 MG tablet Commonly known as: PACERONE Take 1 tablet (200 mg total) by mouth daily. Start taking on: August 20, 2020   aspirin EC 81 MG tablet Take 1 tablet (81 mg total) by mouth daily.   benztropine 1 MG tablet Commonly known as: COGENTIN Take 1 tablet (1 mg total) by mouth daily.   citalopram 20 MG tablet Commonly known as: CELEXA Take 1 tablet (20 mg total) by mouth every morning.   ferrous sulfate 325 (65 FE) MG EC tablet Take 1 tablet (325 mg total) by mouth daily.   glycopyrrolate 1 MG tablet Commonly known as: Robinul Take 1 tablet (1 mg total) by mouth 3 (three) times daily.   ipratropium-albuterol 0.5-2.5 (3) MG/3ML Soln Commonly known as: DUONEB Take 3 mLs by nebulization 4 (four) times daily.   loratadine 10 MG tablet Commonly known as: Claritin Take 1 tablet (10 mg total) by mouth daily as needed for allergies.   metoprolol tartrate 50 MG tablet Commonly known as: LOPRESSOR Take 0.5 tablets (25 mg total) by mouth 2 (two) times daily.   pantoprazole 40 MG tablet Commonly known as: Protonix Take 1 tablet (40 mg total) by mouth daily.   predniSONE 20 MG tablet Commonly known as: Deltasone Take 2 tablets (40 mg total) by mouth daily for 5 days.   rivastigmine 9.5  mg/24hr Commonly known as: EXELON Place 1 patch (9.5 mg total) onto the skin daily. *Remove and discard used patches*   zolpidem 10 MG tablet Commonly known as: AMBIEN Take 0.5 tablets (5 mg total) by mouth at bedtime as needed for sleep.            Discharge Care Instructions  (From admission, onward)         Start     Ordered   08/19/20 0000  Leave dressing on - Keep it clean, dry, and intact until clinic visit        08/19/20 1129          Follow-up Information    Iona Beard, MD Follow up in 1 week(s).   Specialty: Family Medicine Contact information: [address] [place] [address] [phone]              Allergies  Allergen Reactions  . Adhesive [Tape] Rash  . Ensure Pudding [Nutritional Supplements] Swelling and Rash    Throat swelling  . Penicillins Swelling and Rash    Has patient had a PCN reaction causing immediate rash, facial/tongue/throat swelling, SOB or lightheadedness  with hypotension: Yes Has patient had a PCN reaction causing severe rash involving mucus membranes or skin necrosis: Yes Has patient had a PCN reaction that required hospitalization No Has patient had a PCN reaction occurring within the last 10 years: No If all of the above answers are "NO", then may proceed with Cephalosporin use.    Consultations:  Palliative care   Procedures/Studies: CT Head Wo Contrast  Result Date: 08/16/2020 CLINICAL DATA:  Altered level of consciousness, sepsis, tachypnea EXAM: CT HEAD WITHOUT CONTRAST TECHNIQUE: Contiguous axial images were obtained from the base of the skull through the vertex without intravenous contrast. COMPARISON:  10/18/2017 FINDINGS: Brain: No acute infarct or hemorrhage. Lateral ventricles and midline structures are stable. 2.5 x 2.2 cm mass within the anterior aspect of the right middle cranial fossa not appreciably changed since prior MRI dating to 2019, most consistent with meningioma. No significant mass  effect. No acute extra-axial fluid collections. Vascular: No hyperdense vessel or unexpected calcification. Skull: Normal. Negative for fracture or focal lesion. Sinuses/Orbits: Diffuse mucosal thickening throughout the paranasal sinuses, most pronounced within the sphenoid sinuses. Other: None. IMPRESSION: 1. No acute intracranial process. 2. Probable meningioma right middle cranial fossa, without significant change since 2019. Electronically Signed   By: Randa Ngo M.D.   On: 08/16/2020 17:34   CT Angio Chest PE W/Cm &/Or Wo Cm  Result Date: 08/16/2020 CLINICAL DATA:  Altered level of consciousness, sepsis, tachypnea EXAM: CT ANGIOGRAPHY CHEST CT ABDOMEN AND PELVIS WITH CONTRAST TECHNIQUE: Multidetector CT imaging of the chest was performed using the standard protocol during bolus administration of intravenous contrast. Multiplanar CT image reconstructions and MIPs were obtained to evaluate the vascular anatomy. Multidetector CT imaging of the abdomen and pelvis was performed using the standard protocol during bolus administration of intravenous contrast. CONTRAST:  178mL OMNIPAQUE IOHEXOL 350 MG/ML SOLN COMPARISON:  08/16/2020, 09/17/2013 FINDINGS: CTA CHEST FINDINGS Cardiovascular: This is a technically adequate evaluation of the pulmonary vasculature. No filling defects or pulmonary emboli. The heart is enlarged with prominent left atrial dilatation. No pericardial effusion. Mild atherosclerosis of the aorta and coronary vessels. Mediastinum/Nodes: No enlarged mediastinal, hilar, or axillary lymph nodes. Thyroid gland, trachea, and esophagus demonstrate no significant findings. Lungs/Pleura: Dependent hypoventilatory changes are seen bilaterally. No acute airspace disease, effusion, or pneumothorax. Musculoskeletal: No acute or destructive bony lesions. Chronic appearing T9 compression deformity. Reconstructed images demonstrate no additional findings. Review of the MIP images confirms the above  findings. CT ABDOMEN and PELVIS FINDINGS Hepatobiliary: Numerous hepatic cysts are again identified, without significant change since prior study. Gallbladder is unremarkable. Pancreas: Unremarkable. No pancreatic ductal dilatation or surrounding inflammatory changes. Spleen: Normal in size without focal abnormality. Adrenals/Urinary Tract: Bilateral renal cortical atrophy. Stable left renal cortical cyst. The adrenals are unremarkable. Bladder is decompressed with a Foley catheter. Stomach/Bowel: No bowel obstruction or ileus. Minimal descending colonic diverticulosis without diverticulitis. No bowel wall thickening or inflammatory change. Vascular/Lymphatic: Aortic atherosclerosis. No enlarged abdominal or pelvic lymph nodes. Reproductive: Status post hysterectomy. No adnexal masses. Other: No free fluid or free gas. No abdominal wall hernia. Musculoskeletal: No acute or destructive bony lesions. Chronic L1 compression deformity. Reconstructed images demonstrate no additional findings. Review of the MIP images confirms the above findings. IMPRESSION: 1. No evidence of pulmonary embolus. 2. Cardiomegaly with prominent left atrial dilatation. 3. No acute intra-abdominal or intrapelvic process. 4. Minimal descending colonic diverticulosis without diverticulitis. 5. Aortic Atherosclerosis (ICD10-I70.0). Electronically Signed   By: Randa Ngo M.D.   On: 08/16/2020  17:44   CT Abdomen Pelvis W Contrast  Result Date: 08/16/2020 CLINICAL DATA:  Altered level of consciousness, sepsis, tachypnea EXAM: CT ANGIOGRAPHY CHEST CT ABDOMEN AND PELVIS WITH CONTRAST TECHNIQUE: Multidetector CT imaging of the chest was performed using the standard protocol during bolus administration of intravenous contrast. Multiplanar CT image reconstructions and MIPs were obtained to evaluate the vascular anatomy. Multidetector CT imaging of the abdomen and pelvis was performed using the standard protocol during bolus administration of  intravenous contrast. CONTRAST:  170mL OMNIPAQUE IOHEXOL 350 MG/ML SOLN COMPARISON:  08/16/2020, 09/17/2013 FINDINGS: CTA CHEST FINDINGS Cardiovascular: This is a technically adequate evaluation of the pulmonary vasculature. No filling defects or pulmonary emboli. The heart is enlarged with prominent left atrial dilatation. No pericardial effusion. Mild atherosclerosis of the aorta and coronary vessels. Mediastinum/Nodes: No enlarged mediastinal, hilar, or axillary lymph nodes. Thyroid gland, trachea, and esophagus demonstrate no significant findings. Lungs/Pleura: Dependent hypoventilatory changes are seen bilaterally. No acute airspace disease, effusion, or pneumothorax. Musculoskeletal: No acute or destructive bony lesions. Chronic appearing T9 compression deformity. Reconstructed images demonstrate no additional findings. Review of the MIP images confirms the above findings. CT ABDOMEN and PELVIS FINDINGS Hepatobiliary: Numerous hepatic cysts are again identified, without significant change since prior study. Gallbladder is unremarkable. Pancreas: Unremarkable. No pancreatic ductal dilatation or surrounding inflammatory changes. Spleen: Normal in size without focal abnormality. Adrenals/Urinary Tract: Bilateral renal cortical atrophy. Stable left renal cortical cyst. The adrenals are unremarkable. Bladder is decompressed with a Foley catheter. Stomach/Bowel: No bowel obstruction or ileus. Minimal descending colonic diverticulosis without diverticulitis. No bowel wall thickening or inflammatory change. Vascular/Lymphatic: Aortic atherosclerosis. No enlarged abdominal or pelvic lymph nodes. Reproductive: Status post hysterectomy. No adnexal masses. Other: No free fluid or free gas. No abdominal wall hernia. Musculoskeletal: No acute or destructive bony lesions. Chronic L1 compression deformity. Reconstructed images demonstrate no additional findings. Review of the MIP images confirms the above findings.  IMPRESSION: 1. No evidence of pulmonary embolus. 2. Cardiomegaly with prominent left atrial dilatation. 3. No acute intra-abdominal or intrapelvic process. 4. Minimal descending colonic diverticulosis without diverticulitis. 5. Aortic Atherosclerosis (ICD10-I70.0). Electronically Signed   By: Randa Ngo M.D.   On: 08/16/2020 17:44   DG Chest Port 1 View  Result Date: 08/17/2020 CLINICAL DATA:  Shortness of breath EXAM: PORTABLE CHEST 1 VIEW COMPARISON:  Film from the previous day FINDINGS: Cardiac shadow is stable. Aortic calcifications are again seen. Lungs are well aerated bilaterally. No focal infiltrate or sizable effusion is seen. The atelectatic changes seen on prior CT examination are not well appreciated on today's exam. IMPRESSION: No acute abnormality noted. Atelectatic changes seen on yesterday's CT are not well appreciated on this exam. Electronically Signed   By: Inez Catalina M.D.   On: 08/17/2020 03:14   DG Chest Port 1 View  Result Date: 08/16/2020 CLINICAL DATA:  Possible sepsis EXAM: PORTABLE CHEST 1 VIEW COMPARISON:  03/18/2019 FINDINGS: Low lung volumes. Mild chronic interstitial prominence. No definite new consolidation. No pleural effusion. Similar cardiomediastinal contours with cardiomegaly. IMPRESSION: No acute process in the chest. Electronically Signed   By: Macy Mis M.D.   On: 08/16/2020 13:18   ECHOCARDIOGRAM COMPLETE  Result Date: 08/17/2020    ECHOCARDIOGRAM REPORT   Patient Name:   [name] Date of Exam: 08/17/2020 Medical Rec #:  [id number]    Height:       61.0 in Accession #:    [id number]   Weight:       156.1 lb  Date of Birth:  [birth year]    BSA:          1.700 m Patient Age:    32 years     BP:           94/78 mmHg Patient Gender: F            HR:           69 bpm. Exam Location:  [facility] Procedure: 2D Echo, Cardiac Doppler and Color Doppler Indications:    Atrial Fibrillation 427.31 / I48.91  History:        Patient has prior history of  Echocardiogram examinations, most                 recent 01/07/2015. CHF, Previous Myocardial Infarction; Risk                 Factors:Hypertension. Dementia with behavioral disturbance,                 Parkinson disease, Cancer ([place]) (From Hx).  Sonographer:    Alvino Chapel RCS Referring Phys: [id number] CARLOS MADERA  Sonographer Comments: Could NOT obtain IVC or Suprasternal windows IMPRESSIONS  1. Left ventricular ejection fraction, by estimation, is 60 to 65%. The left ventricle has normal function. The left ventricle has no regional wall motion abnormalities. There is severe left ventricular hypertrophy. Left ventricular diastolic parameters  are consistent with Grade II diastolic dysfunction (pseudonormalization).  2. Right ventricular systolic function is normal. The right ventricular size is normal.  3. Left atrial size was severely dilated.  4. The mitral valve is grossly normal. Mild to moderate mitral valve regurgitation.  5. The aortic valve is tricuspid. Aortic valve regurgitation is trivial. Mild aortic valve sclerosis is present, with no evidence of aortic valve stenosis.  6. There is a trivial pericardial effusion. Pericardial adipose tissue also noted.  7. Large hepatic cyst noted. FINDINGS  Left Ventricle: Left ventricular ejection fraction, by estimation, is 60 to 65%. The left ventricle has normal function. The left ventricle has no regional wall motion abnormalities. The left ventricular internal cavity size was normal in size. There is  severe left ventricular hypertrophy. Left ventricular diastolic parameters are consistent with Grade II diastolic dysfunction (pseudonormalization). Right Ventricle: The right ventricular size is normal. No increase in right ventricular wall thickness. Right ventricular systolic function is normal. Left Atrium: Left atrial size was severely dilated. Right Atrium: Right atrial size was normal in size. Pericardium: Trivial pericardial effusion is present. Presence of  pericardial fat pad. Mitral Valve: The mitral valve is grossly normal. Mild mitral annular calcification. Mild to moderate mitral valve regurgitation. Tricuspid Valve: The tricuspid valve is grossly normal. Tricuspid valve regurgitation is mild. Aortic Valve: The aortic valve is tricuspid. There is moderate aortic valve annular calcification. Aortic valve regurgitation is trivial. Mild aortic valve sclerosis is present, with no evidence of aortic valve stenosis. Pulmonic Valve: The pulmonic valve was not well visualized. Pulmonic valve regurgitation is trivial. Aorta: The aortic root is normal in size and structure. IAS/Shunts: No atrial level shunt detected by color flow Doppler.  LEFT VENTRICLE PLAX 2D LVIDd:         2.70 cm  Diastology LVIDs:         2.00 cm  LV e' medial:    3.05 cm/s LV PW:         1.70 cm  LV E/e' medial:  34.1 LV IVS:        1.80 cm  LV e' lateral:   4.03 cm/s LVOT diam:     1.50 cm  LV E/e' lateral: 25.8 LV SV:         48 LV SV Index:   28 LVOT Area:     1.77 cm  RIGHT VENTRICLE RV S prime:     7.29 cm/s TAPSE (M-mode): 1.2 cm LEFT ATRIUM             Index       RIGHT ATRIUM           Index LA diam:        4.70 cm 2.76 cm/m  RA Area:     14.00 cm LA Vol (A2C):   75.7 ml 44.53 ml/m RA Volume:   33.70 ml  19.82 ml/m LA Vol (A4C):   96.9 ml 57.00 ml/m LA Biplane Vol: 90.8 ml 53.42 ml/m  AORTIC VALVE LVOT Vmax:   134.00 cm/s LVOT Vmean:  89.600 cm/s LVOT VTI:    0.271 m  AORTA Ao Root diam: 2.80 cm MITRAL VALVE                TRICUSPID VALVE MV Area (PHT): 2.70 cm     TR Peak grad:   25.0 mmHg MV Decel Time: 281 msec     TR Vmax:        250.00 cm/s MV E velocity: 104.00 cm/s MV A velocity: 89.20 cm/s   SHUNTS MV E/A ratio:  1.17         Systemic VTI:  0.27 m                             Systemic Diam: 1.50 cm Rozann Lesches MD Electronically signed by Rozann Lesches MD Signature Date/Time: 08/17/2020/3:37:17 PM    Final      Discharge Exam: Vitals:   08/19/20 0900 08/19/20 1100   BP:    Pulse: 78 85  Resp: 16 (!) 21  Temp:    SpO2: 99% 97%   Vitals:   08/19/20 0746 08/19/20 0800 08/19/20 0900 08/19/20 1100  BP:  124/83    Pulse:  73 78 85  Resp:  18 16 (!) 21  Temp: 99.3 F (37.4 C)     TempSrc: Axillary     SpO2:  99% 99% 97%  Weight:      Height:        General: Pt is somnolent and arousable to voice, nonverbal Cardiovascular: RRR, S1/S2 +, no rubs, no gallops Respiratory: CTA bilaterally, no wheezing, no rhonchi, on 2 L nasal cannula oxygen Abdominal: Soft, NT, ND, bowel sounds + Extremities: no edema, no cyanosis    The results of significant diagnostics from this hospitalization (including imaging, microbiology, ancillary and laboratory) are listed below for reference.     Microbiology: Recent Results (from the past 240 hour(s))  Blood Culture (routine x 2)     Status: None (Preliminary result)   Collection Time: 08/16/20  1:00 PM   Specimen: Right Antecubital; Blood  Result Value Ref Range Status   Specimen Description   Final    RIGHT ANTECUBITAL BOTTLES DRAWN AEROBIC AND ANAEROBIC   Special Requests Blood Culture adequate volume  Final   Culture   Final    NO GROWTH 3 DAYS Performed at [facility], [address] [place], [place] [address]    Report Status PENDING  Incomplete  Blood Culture (routine x 2)     Status: Abnormal  Collection Time: 08/16/20  1:05 PM   Specimen: BLOOD RIGHT WRIST  Result Value Ref Range Status   Specimen Description   Final    BLOOD RIGHT WRIST BOTTLES DRAWN AEROBIC AND ANAEROBIC Performed at [facility], [address], [place], [place] [address]    Special Requests   Final    Blood Culture adequate volume Performed at [facility], [address], [place], [place] [address]    Culture  Setup Time   Final    GRAM POSITIVE COCCI IN BOTH AEROBIC AND ANAEROBIC BOTTLES Gram Stain Report Called to,Read Back By and Verified With: MICHAEL DOSS @0830  08/17/20 BY JONES,T [facility] Organism ID to follow     Culture (A)  Final    STAPHYLOCOCCUS HOMINIS THE SIGNIFICANCE OF ISOLATING THIS ORGANISM FROM A SINGLE SET OF BLOOD CULTURES WHEN MULTIPLE SETS ARE DRAWN IS UNCERTAIN. PLEASE NOTIFY THE MICROBIOLOGY DEPARTMENT WITHIN ONE WEEK IF SPECIATION AND SENSITIVITIES ARE REQUIRED. Performed at [facility], [place] [address], [place], [place] [address]    Report Status 08/19/2020 FINAL  Final  Blood Culture ID Panel (Reflexed)     Status: Abnormal   Collection Time: 08/16/20  1:05 PM  Result Value Ref Range Status   Enterococcus faecalis NOT DETECTED NOT DETECTED Final   Enterococcus Faecium NOT DETECTED NOT DETECTED Final   Listeria monocytogenes NOT DETECTED NOT DETECTED Final   Staphylococcus species DETECTED (A) NOT DETECTED Final    Comment: CRITICAL RESULT CALLED TO, READ BACK BY AND VERIFIED WITH: Gloris Manchester PharmD 14:30 08/17/20 ([email or web address])    Staphylococcus aureus (BCID) NOT DETECTED NOT DETECTED Final   Staphylococcus epidermidis NOT DETECTED NOT DETECTED Final   Staphylococcus lugdunensis NOT DETECTED NOT DETECTED Final   Streptococcus species NOT DETECTED NOT DETECTED Final   Streptococcus agalactiae NOT DETECTED NOT DETECTED Final   Streptococcus pneumoniae NOT DETECTED NOT DETECTED Final   Streptococcus pyogenes NOT DETECTED NOT DETECTED Final   A.calcoaceticus-baumannii NOT DETECTED NOT DETECTED Final   Bacteroides fragilis NOT DETECTED NOT DETECTED Final   Enterobacterales NOT DETECTED NOT DETECTED Final   Enterobacter cloacae complex NOT DETECTED NOT DETECTED Final   Escherichia coli NOT DETECTED NOT DETECTED Final   Klebsiella aerogenes NOT DETECTED NOT DETECTED Final   Klebsiella oxytoca NOT DETECTED NOT DETECTED Final   Klebsiella pneumoniae NOT DETECTED NOT DETECTED Final   Proteus species NOT DETECTED NOT DETECTED Final   Salmonella species NOT DETECTED NOT DETECTED Final   Serratia marcescens NOT DETECTED NOT DETECTED Final   Haemophilus influenzae NOT DETECTED NOT  DETECTED Final   Neisseria meningitidis NOT DETECTED NOT DETECTED Final   Pseudomonas aeruginosa NOT DETECTED NOT DETECTED Final   Stenotrophomonas maltophilia NOT DETECTED NOT DETECTED Final   Candida albicans NOT DETECTED NOT DETECTED Final   Candida auris NOT DETECTED NOT DETECTED Final   Candida glabrata NOT DETECTED NOT DETECTED Final   Candida krusei NOT DETECTED NOT DETECTED Final   Candida parapsilosis NOT DETECTED NOT DETECTED Final   Candida tropicalis NOT DETECTED NOT DETECTED Final   Cryptococcus neoformans/gattii NOT DETECTED NOT DETECTED Final    Comment: Performed at [facility], [address] [place], [place] [address]  Urine culture     Status: Abnormal   Collection Time: 08/16/20  2:49 PM   Specimen: Urine, Catheterized  Result Value Ref Range Status   Specimen Description   Final    URINE, CATHETERIZED Performed at [facility], [address] [place], [place] [address]  Special Requests   Final    NONE Performed at [facility] [address] [place], [place] [address]    Culture (A)  Final    <10,000 COLONIES/mL INSIGNIFICANT GROWTH Performed at [facility] [address], [place], [place] [address]    Report Status 08/18/2020 FINAL  Final  Resp Panel by RT-PCR (Flu A&B, Covid) Nasopharyngeal Swab     Status: None   Collection Time: 08/16/20  3:17 PM   Specimen: Nasopharyngeal Swab; Nasopharyngeal(NP) swabs in vial transport medium  Result Value Ref Range Status   SARS Coronavirus 2 by RT PCR NEGATIVE NEGATIVE Final    Comment: (NOTE) SARS-CoV-2 target nucleic acids are NOT DETECTED.  The SARS-CoV-2 RNA is generally detectable in upper respiratory specimens during the acute phase of infection. The lowest concentration of SARS-CoV-2 viral copies this assay can detect is 138 copies/mL. A negative result does not preclude SARS-Cov-2 infection and should not be used as the sole basis for treatment or other patient management decisions. A  negative result may occur with  improper specimen collection/handling, submission of specimen other than nasopharyngeal swab, presence of viral mutation(s) within the areas targeted by this assay, and inadequate number of viral copies(<138 copies/mL). A negative result must be combined with clinical observations, patient history, and epidemiological information. The expected result is Negative.  Fact Sheet for Patients:  EntrepreneurPulse.com.au  Fact Sheet for Healthcare Providers:  IncredibleEmployment.be  This test is no t yet approved or cleared by the [place] FDA and  has been authorized for detection and/or diagnosis of SARS-CoV-2 by FDA under an Emergency Use Authorization (EUA). This EUA will remain  in effect (meaning this test can be used) for the duration of the COVID-19 declaration under Section 564(b)(1) of the Act, 21 U.S.C.section 360bbb-3(b)(1), unless the authorization is terminated  or revoked sooner.       Influenza A by PCR NEGATIVE NEGATIVE Final   Influenza B by PCR NEGATIVE NEGATIVE Final    Comment: (NOTE) The Xpert Xpress SARS-CoV-2/FLU/RSV plus assay is intended as an aid in the diagnosis of influenza from Nasopharyngeal swab specimens and should not be used as a sole basis for treatment. Nasal washings and aspirates are unacceptable for Xpert Xpress SARS-CoV-2/FLU/RSV testing.  Fact Sheet for Patients: EntrepreneurPulse.com.au  Fact Sheet for Healthcare Providers: IncredibleEmployment.be  This test is not yet approved or cleared by the [place] FDA and has been authorized for detection and/or diagnosis of SARS-CoV-2 by FDA under an Emergency Use Authorization (EUA). This EUA will remain in effect (meaning this test can be used) for the duration of the COVID-19 declaration under Section 564(b)(1) of the Act, 21 U.S.C. section 360bbb-3(b)(1), unless the authorization  is terminated or revoked.  Performed at [facility], [address], [place], [place] [address]   Culture, blood (routine x 2)     Status: None (Preliminary result)   Collection Time: 08/18/20  9:04 AM   Specimen: BLOOD  Result Value Ref Range Status   Specimen Description BLOOD BLOOD RIGHT HAND  Final   Special Requests   Final    BOTTLES DRAWN AEROBIC AND ANAEROBIC Blood Culture adequate volume   Culture   Final    NO GROWTH < 24 HOURS Performed at [facility], [address], [place], [place] [address]    Report Status PENDING  Incomplete     Labs: BNP (last 3 results) Recent Labs    08/16/20 1312  BNP 347.4*   Basic Metabolic Panel: Recent  Labs  Lab 08/16/20 1300 08/17/20 0819 08/18/20 0541 08/19/20 0337  NA 146* 147*  --  143  K 3.4* 3.8  --  4.2  CL 110 113*  --  109  CO2 26 23  --  25  GLUCOSE 167* 171*  --  183*  BUN 21 25*  --  38*  CREATININE 1.05* 0.75 0.80 1.09*  CALCIUM 8.6* 8.4*  --  8.5*  MG  --   --   --  2.4   Liver Function Tests: Recent Labs  Lab 08/16/20 1300  AST 32  ALT 24  ALKPHOS 72  BILITOT 0.5  PROT 8.6*  ALBUMIN 3.1*   No results for input(s): LIPASE, AMYLASE in the last 168 hours. No results for input(s): AMMONIA in the last 168 hours. CBC: Recent Labs  Lab 08/16/20 1300 08/17/20 0819 08/19/20 0337  WBC 20.4* 16.7* 10.8*  NEUTROABS 18.0*  --   --   HGB 12.1 10.6* 10.8*  HCT 40.2 36.8 36.8  MCV 76.3* 78.0* 76.3*  PLT 325 170 234   Cardiac Enzymes: No results for input(s): CKTOTAL, CKMB, CKMBINDEX, TROPONINI in the last 168 hours. BNP: Invalid input(s): POCBNP CBG: No results for input(s): GLUCAP in the last 168 hours. D-Dimer No results for input(s): DDIMER in the last 72 hours. Hgb A1c No results for input(s): HGBA1C in the last 72 hours. Lipid Profile No results for input(s): CHOL, HDL, LDLCALC, TRIG, CHOLHDL, LDLDIRECT in the last 72 hours. Thyroid function studies No results for input(s): TSH, T4TOTAL,  T3FREE, THYROIDAB in the last 72 hours.  Invalid input(s): FREET3 Anemia work up No results for input(s): VITAMINB12, FOLATE, FERRITIN, TIBC, IRON, RETICCTPCT in the last 72 hours. Urinalysis    Component Value Date/Time   COLORURINE YELLOW 08/16/2020 1448   APPEARANCEUR HAZY (A) 08/16/2020 1448   LABSPEC 1.018 08/16/2020 1448   PHURINE 5.0 08/16/2020 1448   GLUCOSEU NEGATIVE 08/16/2020 1448   HGBUR MODERATE (A) 08/16/2020 1448   BILIRUBINUR NEGATIVE 08/16/2020 1448   KETONESUR NEGATIVE 08/16/2020 1448   PROTEINUR 100 (A) 08/16/2020 1448   UROBILINOGEN 0.2 01/06/2015 1842   NITRITE NEGATIVE 08/16/2020 1448   LEUKOCYTESUR NEGATIVE 08/16/2020 1448   Sepsis Labs Invalid input(s): PROCALCITONIN,  WBC,  LACTICIDVEN Microbiology Recent Results (from the past 240 hour(s))  Blood Culture (routine x 2)     Status: None (Preliminary result)   Collection Time: 08/16/20  1:00 PM   Specimen: Right Antecubital; Blood  Result Value Ref Range Status   Specimen Description   Final    RIGHT ANTECUBITAL BOTTLES DRAWN AEROBIC AND ANAEROBIC   Special Requests Blood Culture adequate volume  Final   Culture   Final    NO GROWTH 3 DAYS Performed at [facility], [address], [place], [place] [address]    Report Status PENDING  Incomplete  Blood Culture (routine x 2)     Status: Abnormal   Collection Time: 08/16/20  1:05 PM   Specimen: BLOOD RIGHT WRIST  Result Value Ref Range Status   Specimen Description   Final    BLOOD RIGHT WRIST BOTTLES DRAWN AEROBIC AND ANAEROBIC Performed at [facility], [address] [place], [place] [address]    Special Requests   Final    Blood Culture adequate volume Performed at [facility], [address], [place], [place] [address]    Culture  Setup Time   Final    GRAM POSITIVE COCCI IN BOTH AEROBIC AND ANAEROBIC BOTTLES Gram Stain Report Called  to,Read Back By and Verified With: MICHAEL DOSS @0830  08/17/20 BY JONES,T [facility] Organism ID to follow     Culture (A)  Final    STAPHYLOCOCCUS HOMINIS THE SIGNIFICANCE OF ISOLATING THIS ORGANISM FROM A SINGLE SET OF BLOOD CULTURES WHEN MULTIPLE SETS ARE DRAWN IS UNCERTAIN. PLEASE NOTIFY THE MICROBIOLOGY DEPARTMENT WITHIN ONE WEEK IF SPECIATION AND SENSITIVITIES ARE REQUIRED. Performed at [facility], [place] [address] [place], [place] [address]    Report Status 08/19/2020 FINAL  Final  Blood Culture ID Panel (Reflexed)     Status: Abnormal   Collection Time: 08/16/20  1:05 PM  Result Value Ref Range Status   Enterococcus faecalis NOT DETECTED NOT DETECTED Final   Enterococcus Faecium NOT DETECTED NOT DETECTED Final   Listeria monocytogenes NOT DETECTED NOT DETECTED Final   Staphylococcus species DETECTED (A) NOT DETECTED Final    Comment: CRITICAL RESULT CALLED TO, READ BACK BY AND VERIFIED WITH: Gloris Manchester PharmD 14:30 08/17/20 ([email or web address])    Staphylococcus aureus (BCID) NOT DETECTED NOT DETECTED Final   Staphylococcus epidermidis NOT DETECTED NOT DETECTED Final   Staphylococcus lugdunensis NOT DETECTED NOT DETECTED Final   Streptococcus species NOT DETECTED NOT DETECTED Final   Streptococcus agalactiae NOT DETECTED NOT DETECTED Final   Streptococcus pneumoniae NOT DETECTED NOT DETECTED Final   Streptococcus pyogenes NOT DETECTED NOT DETECTED Final   A.calcoaceticus-baumannii NOT DETECTED NOT DETECTED Final   Bacteroides fragilis NOT DETECTED NOT DETECTED Final   Enterobacterales NOT DETECTED NOT DETECTED Final   Enterobacter cloacae complex NOT DETECTED NOT DETECTED Final   Escherichia coli NOT DETECTED NOT DETECTED Final   Klebsiella aerogenes NOT DETECTED NOT DETECTED Final   Klebsiella oxytoca NOT DETECTED NOT DETECTED Final   Klebsiella pneumoniae NOT DETECTED NOT DETECTED Final   Proteus species NOT DETECTED NOT DETECTED Final   Salmonella species NOT DETECTED NOT DETECTED Final   Serratia marcescens NOT DETECTED NOT DETECTED Final   Haemophilus influenzae NOT DETECTED NOT  DETECTED Final   Neisseria meningitidis NOT DETECTED NOT DETECTED Final   Pseudomonas aeruginosa NOT DETECTED NOT DETECTED Final   Stenotrophomonas maltophilia NOT DETECTED NOT DETECTED Final   Candida albicans NOT DETECTED NOT DETECTED Final   Candida auris NOT DETECTED NOT DETECTED Final   Candida glabrata NOT DETECTED NOT DETECTED Final   Candida krusei NOT DETECTED NOT DETECTED Final   Candida parapsilosis NOT DETECTED NOT DETECTED Final   Candida tropicalis NOT DETECTED NOT DETECTED Final   Cryptococcus neoformans/gattii NOT DETECTED NOT DETECTED Final    Comment: Performed at [facility], [address], [place], [place] [address]  Urine culture     Status: Abnormal   Collection Time: 08/16/20  2:49 PM   Specimen: Urine, Catheterized  Result Value Ref Range Status   Specimen Description   Final    URINE, CATHETERIZED Performed at [facility], [address], [place], [place] [address]    Special Requests   Final    NONE Performed at [facility], [address], [place], [place] [address]    Culture (A)  Final    <10,000 COLONIES/mL INSIGNIFICANT GROWTH Performed at [facility] [address], [place], [place] [address]    Report Status 08/18/2020 FINAL  Final  Resp Panel by RT-PCR (Flu A&B, Covid) Nasopharyngeal Swab     Status: None   Collection Time: 08/16/20  3:17 PM   Specimen: Nasopharyngeal Swab; Nasopharyngeal(NP) swabs in vial transport medium  Result Value Ref Range Status  SARS Coronavirus 2 by RT PCR NEGATIVE NEGATIVE Final    Comment: (NOTE) SARS-CoV-2 target nucleic acids are NOT DETECTED.  The SARS-CoV-2 RNA is generally detectable in upper respiratory specimens during the acute phase of infection. The lowest concentration of SARS-CoV-2 viral copies this assay can detect is 138 copies/mL. A negative result does not preclude SARS-Cov-2 infection and should not be used as the sole basis for treatment or other patient management decisions. A  negative result may occur with  improper specimen collection/handling, submission of specimen other than nasopharyngeal swab, presence of viral mutation(s) within the areas targeted by this assay, and inadequate number of viral copies(<138 copies/mL). A negative result must be combined with clinical observations, patient history, and epidemiological information. The expected result is Negative.  Fact Sheet for Patients:  EntrepreneurPulse.com.au  Fact Sheet for Healthcare Providers:  IncredibleEmployment.be  This test is no t yet approved or cleared by the [place] FDA and  has been authorized for detection and/or diagnosis of SARS-CoV-2 by FDA under an Emergency Use Authorization (EUA). This EUA will remain  in effect (meaning this test can be used) for the duration of the COVID-19 declaration under Section 564(b)(1) of the Act, 21 U.S.C.section 360bbb-3(b)(1), unless the authorization is terminated  or revoked sooner.       Influenza A by PCR NEGATIVE NEGATIVE Final   Influenza B by PCR NEGATIVE NEGATIVE Final    Comment: (NOTE) The Xpert Xpress SARS-CoV-2/FLU/RSV plus assay is intended as an aid in the diagnosis of influenza from Nasopharyngeal swab specimens and should not be used as a sole basis for treatment. Nasal washings and aspirates are unacceptable for Xpert Xpress SARS-CoV-2/FLU/RSV testing.  Fact Sheet for Patients: EntrepreneurPulse.com.au  Fact Sheet for Healthcare Providers: IncredibleEmployment.be  This test is not yet approved or cleared by the [place] FDA and has been authorized for detection and/or diagnosis of SARS-CoV-2 by FDA under an Emergency Use Authorization (EUA). This EUA will remain in effect (meaning this test can be used) for the duration of the COVID-19 declaration under Section 564(b)(1) of the Act, 21 U.S.C. section 360bbb-3(b)(1), unless the authorization  is terminated or revoked.  Performed at [facility], [address], [place], [place] [address]   Culture, blood (routine x 2)     Status: None (Preliminary result)   Collection Time: 08/18/20  9:04 AM   Specimen: BLOOD  Result Value Ref Range Status   Specimen Description BLOOD BLOOD RIGHT HAND  Final   Special Requests   Final    BOTTLES DRAWN AEROBIC AND ANAEROBIC Blood Culture adequate volume   Culture   Final    NO GROWTH < 24 HOURS Performed at [facility], [address], [place], [place] [address]    Report Status PENDING  Incomplete     Time coordinating discharge: 35 minutes  SIGNED:   Rodena Goldmann, DO [facility] 08/19/2020, 11:38 AM  If 7PM-7AM, please contact night-coverage [email or web address]

## 2020-08-21 LAB — CULTURE, BLOOD (ROUTINE X 2)
Culture: NO GROWTH
Special Requests: ADEQUATE

## 2020-08-23 ENCOUNTER — Telehealth: Payer: Self-pay

## 2020-08-23 LAB — CULTURE, BLOOD (ROUTINE X 2)
Culture: NO GROWTH
Special Requests: ADEQUATE

## 2020-08-23 NOTE — Telephone Encounter (Signed)
Received call from daughter concerning paitents catheter. Caller stated she was informed that Urology would come out at time of discharge to help with patients catheter. Caller made aware that we are a Urology office and not home health. Appoointment offered and declined. Per discharge note from hospital caller was instructed to call PCP for possible home health order concerning catheter since office visit was declined.

## 2020-08-24 ENCOUNTER — Emergency Department (HOSPITAL_COMMUNITY): Payer: Medicare Other

## 2020-08-24 ENCOUNTER — Inpatient Hospital Stay (HOSPITAL_COMMUNITY): Payer: Medicare Other

## 2020-08-24 ENCOUNTER — Inpatient Hospital Stay (HOSPITAL_COMMUNITY)
Admission: EM | Admit: 2020-08-24 | Discharge: 2020-08-30 | DRG: 871 | Disposition: A | Discharge status: Home / Self Care | Payer: Medicare Other | Attending: Internal Medicine | Admitting: Internal Medicine

## 2020-08-24 ENCOUNTER — Encounter (HOSPITAL_COMMUNITY): Payer: Self-pay | Admitting: *Deleted

## 2020-08-24 DIAGNOSIS — R509 Fever, unspecified: Secondary | ICD-10-CM

## 2020-08-24 DIAGNOSIS — G9340 Encephalopathy, unspecified: Secondary | ICD-10-CM | POA: Diagnosis not present

## 2020-08-24 DIAGNOSIS — E870 Hyperosmolality and hypernatremia: Secondary | ICD-10-CM | POA: Diagnosis present

## 2020-08-24 DIAGNOSIS — R0902 Hypoxemia: Secondary | ICD-10-CM | POA: Diagnosis not present

## 2020-08-24 DIAGNOSIS — I10 Essential (primary) hypertension: Secondary | ICD-10-CM | POA: Diagnosis present

## 2020-08-24 DIAGNOSIS — J690 Pneumonitis due to inhalation of food and vomit: Secondary | ICD-10-CM | POA: Diagnosis not present

## 2020-08-24 DIAGNOSIS — I517 Cardiomegaly: Secondary | ICD-10-CM | POA: Diagnosis not present

## 2020-08-24 DIAGNOSIS — Z9049 Acquired absence of other specified parts of digestive tract: Secondary | ICD-10-CM

## 2020-08-24 DIAGNOSIS — R404 Transient alteration of awareness: Secondary | ICD-10-CM | POA: Diagnosis not present

## 2020-08-24 DIAGNOSIS — Z79899 Other long term (current) drug therapy: Secondary | ICD-10-CM

## 2020-08-24 DIAGNOSIS — F418 Other specified anxiety disorders: Secondary | ICD-10-CM | POA: Diagnosis present

## 2020-08-24 DIAGNOSIS — G9341 Metabolic encephalopathy: Secondary | ICD-10-CM | POA: Diagnosis not present

## 2020-08-24 DIAGNOSIS — A419 Sepsis, unspecified organism: Secondary | ICD-10-CM | POA: Diagnosis not present

## 2020-08-24 DIAGNOSIS — J9620 Acute and chronic respiratory failure, unspecified whether with hypoxia or hypercapnia: Secondary | ICD-10-CM | POA: Diagnosis present

## 2020-08-24 DIAGNOSIS — F03918 Unspecified dementia, unspecified severity, with other behavioral disturbance: Secondary | ICD-10-CM | POA: Diagnosis present

## 2020-08-24 DIAGNOSIS — F0281 Dementia in other diseases classified elsewhere with behavioral disturbance: Secondary | ICD-10-CM | POA: Diagnosis present

## 2020-08-24 DIAGNOSIS — N179 Acute kidney failure, unspecified: Secondary | ICD-10-CM | POA: Diagnosis present

## 2020-08-24 DIAGNOSIS — N281 Cyst of kidney, acquired: Secondary | ICD-10-CM | POA: Diagnosis not present

## 2020-08-24 DIAGNOSIS — Z7982 Long term (current) use of aspirin: Secondary | ICD-10-CM

## 2020-08-24 DIAGNOSIS — I252 Old myocardial infarction: Secondary | ICD-10-CM | POA: Diagnosis not present

## 2020-08-24 DIAGNOSIS — R627 Adult failure to thrive: Secondary | ICD-10-CM | POA: Diagnosis present

## 2020-08-24 DIAGNOSIS — I5032 Chronic diastolic (congestive) heart failure: Secondary | ICD-10-CM | POA: Diagnosis not present

## 2020-08-24 DIAGNOSIS — Z9981 Dependence on supplemental oxygen: Secondary | ICD-10-CM

## 2020-08-24 DIAGNOSIS — G20 Parkinson's disease: Secondary | ICD-10-CM | POA: Diagnosis not present

## 2020-08-24 DIAGNOSIS — Z90710 Acquired absence of both cervix and uterus: Secondary | ICD-10-CM

## 2020-08-24 DIAGNOSIS — Z66 Do not resuscitate: Secondary | ICD-10-CM | POA: Diagnosis not present

## 2020-08-24 DIAGNOSIS — J449 Chronic obstructive pulmonary disease, unspecified: Secondary | ICD-10-CM | POA: Diagnosis present

## 2020-08-24 DIAGNOSIS — R6520 Severe sepsis without septic shock: Secondary | ICD-10-CM | POA: Diagnosis not present

## 2020-08-24 DIAGNOSIS — R069 Unspecified abnormalities of breathing: Secondary | ICD-10-CM | POA: Diagnosis not present

## 2020-08-24 DIAGNOSIS — I110 Hypertensive heart disease with heart failure: Secondary | ICD-10-CM | POA: Diagnosis present

## 2020-08-24 DIAGNOSIS — G309 Alzheimer's disease, unspecified: Secondary | ICD-10-CM | POA: Diagnosis present

## 2020-08-24 DIAGNOSIS — I480 Paroxysmal atrial fibrillation: Secondary | ICD-10-CM | POA: Diagnosis present

## 2020-08-24 DIAGNOSIS — J9811 Atelectasis: Secondary | ICD-10-CM | POA: Diagnosis not present

## 2020-08-24 DIAGNOSIS — J90 Pleural effusion, not elsewhere classified: Secondary | ICD-10-CM | POA: Diagnosis not present

## 2020-08-24 DIAGNOSIS — R6889 Other general symptoms and signs: Secondary | ICD-10-CM | POA: Diagnosis not present

## 2020-08-24 DIAGNOSIS — G301 Alzheimer's disease with late onset: Secondary | ICD-10-CM | POA: Diagnosis not present

## 2020-08-24 DIAGNOSIS — R5381 Other malaise: Secondary | ICD-10-CM | POA: Diagnosis present

## 2020-08-24 DIAGNOSIS — Z743 Need for continuous supervision: Secondary | ICD-10-CM | POA: Diagnosis not present

## 2020-08-24 DIAGNOSIS — Z20822 Contact with and (suspected) exposure to covid-19: Secondary | ICD-10-CM | POA: Diagnosis present

## 2020-08-24 DIAGNOSIS — Z515 Encounter for palliative care: Secondary | ICD-10-CM

## 2020-08-24 DIAGNOSIS — Z880 Allergy status to penicillin: Secondary | ICD-10-CM

## 2020-08-24 DIAGNOSIS — Z993 Dependence on wheelchair: Secondary | ICD-10-CM

## 2020-08-24 DIAGNOSIS — F0391 Unspecified dementia with behavioral disturbance: Secondary | ICD-10-CM | POA: Diagnosis present

## 2020-08-24 DIAGNOSIS — Q446 Cystic disease of liver: Secondary | ICD-10-CM | POA: Diagnosis not present

## 2020-08-24 DIAGNOSIS — R1310 Dysphagia, unspecified: Secondary | ICD-10-CM | POA: Diagnosis present

## 2020-08-24 DIAGNOSIS — Z853 Personal history of malignant neoplasm of breast: Secondary | ICD-10-CM

## 2020-08-24 DIAGNOSIS — Z7189 Other specified counseling: Secondary | ICD-10-CM | POA: Diagnosis not present

## 2020-08-24 DIAGNOSIS — J9621 Acute and chronic respiratory failure with hypoxia: Secondary | ICD-10-CM | POA: Diagnosis not present

## 2020-08-24 LAB — COMPREHENSIVE METABOLIC PANEL
ALT: 24 U/L (ref 0–44)
AST: 23 U/L (ref 15–41)
Albumin: 3.3 g/dL — ABNORMAL LOW (ref 3.5–5.0)
Alkaline Phosphatase: 51 U/L (ref 38–126)
Anion gap: 11 (ref 5–15)
BUN: 37 mg/dL — ABNORMAL HIGH (ref 8–23)
CO2: 24 mmol/L (ref 22–32)
Calcium: 8.4 mg/dL — ABNORMAL LOW (ref 8.9–10.3)
Chloride: 116 mmol/L — ABNORMAL HIGH (ref 98–111)
Creatinine, Ser: 1.5 mg/dL — ABNORMAL HIGH (ref 0.44–1.00)
GFR, Estimated: 31 mL/min — ABNORMAL LOW (ref >60)
Glucose, Bld: 195 mg/dL — ABNORMAL HIGH (ref 70–99)
Potassium: 3.6 mmol/L (ref 3.5–5.1)
Sodium: 151 mmol/L — ABNORMAL HIGH (ref 135–145)
Total Bilirubin: 1 mg/dL (ref 0.3–1.2)
Total Protein: 7.3 g/dL (ref 6.5–8.1)

## 2020-08-24 LAB — CBC WITH DIFFERENTIAL/PLATELET
Abs Immature Granulocytes: 0.97 10*3/uL — ABNORMAL HIGH (ref 0.00–0.07)
Basophils Absolute: 0.1 10*3/uL (ref 0.0–0.1)
Basophils Relative: 0 %
Eosinophils Absolute: 0 10*3/uL (ref 0.0–0.5)
Eosinophils Relative: 0 %
HCT: 41 % (ref 36.0–46.0)
Hemoglobin: 12.3 g/dL (ref 12.0–15.0)
Immature Granulocytes: 3 %
Lymphocytes Relative: 3 %
Lymphs Abs: 1 10*3/uL (ref 0.7–4.0)
MCH: 22.7 pg — ABNORMAL LOW (ref 26.0–34.0)
MCHC: 30 g/dL (ref 30.0–36.0)
MCV: 75.8 fL — ABNORMAL LOW (ref 80.0–100.0)
Monocytes Absolute: 1.9 10*3/uL — ABNORMAL HIGH (ref 0.1–1.0)
Monocytes Relative: 6 %
Neutro Abs: 30.5 10*3/uL — ABNORMAL HIGH (ref 1.7–7.7)
Neutrophils Relative %: 88 %
Platelets: 345 10*3/uL (ref 150–400)
RBC: 5.41 MIL/uL — ABNORMAL HIGH (ref 3.87–5.11)
RDW: 19.7 % — ABNORMAL HIGH (ref 11.5–15.5)
WBC: 34.4 10*3/uL — ABNORMAL HIGH (ref 4.0–10.5)
nRBC: 0.1 % (ref 0.0–0.2)

## 2020-08-24 LAB — URINALYSIS, ROUTINE W REFLEX MICROSCOPIC
Bacteria, UA: NONE SEEN
Bilirubin Urine: NEGATIVE
Glucose, UA: NEGATIVE mg/dL
Ketones, ur: NEGATIVE mg/dL
Nitrite: NEGATIVE
Protein, ur: >=300 mg/dL — AB
Specific Gravity, Urine: 1.024 (ref 1.005–1.030)
WBC, UA: >50 WBC/hpf — ABNORMAL HIGH (ref 0–5)
pH: 5 (ref 5.0–8.0)

## 2020-08-24 LAB — T4, FREE: Free T4: 0.51 ng/dL — ABNORMAL LOW (ref 0.61–1.12)

## 2020-08-24 LAB — RESP PANEL BY RT-PCR (FLU A&B, COVID) ARPGX2
Influenza A by PCR: NEGATIVE
Influenza B by PCR: NEGATIVE
SARS Coronavirus 2 by RT PCR: NEGATIVE

## 2020-08-24 LAB — LACTIC ACID, PLASMA: Lactic Acid, Venous: 3.3 mmol/L (ref 0.5–1.9)

## 2020-08-24 LAB — APTT: aPTT: 26 seconds (ref 24–36)

## 2020-08-24 LAB — TSH: TSH: 6.061 u[IU]/mL — ABNORMAL HIGH (ref 0.350–4.500)

## 2020-08-24 LAB — PROTIME-INR
INR: 1.3 — ABNORMAL HIGH (ref 0.8–1.2)
Prothrombin Time: 15.4 seconds — ABNORMAL HIGH (ref 11.4–15.2)

## 2020-08-24 MED ORDER — SODIUM CHLORIDE 0.9 % IV BOLUS
500.0000 mL | Freq: Once | INTRAVENOUS | Status: AC
  Administered 2020-08-24: 500 mL via INTRAVENOUS

## 2020-08-24 MED ORDER — LACTATED RINGERS IV BOLUS (SEPSIS)
1000.0000 mL | Freq: Once | INTRAVENOUS | Status: AC
  Administered 2020-08-24: 1000 mL via INTRAVENOUS

## 2020-08-24 MED ORDER — ALBUTEROL SULFATE (2.5 MG/3ML) 0.083% IN NEBU
2.5000 mg | INHALATION_SOLUTION | RESPIRATORY_TRACT | Status: DC | PRN
  Administered 2020-08-24: 2.5 mg via RESPIRATORY_TRACT
  Filled 2020-08-24: qty 3

## 2020-08-24 MED ORDER — LACTATED RINGERS IV BOLUS (SEPSIS)
250.0000 mL | Freq: Once | INTRAVENOUS | Status: AC
  Administered 2020-08-24: 250 mL via INTRAVENOUS

## 2020-08-24 MED ORDER — VANCOMYCIN HCL IN DEXTROSE 1-5 GM/200ML-% IV SOLN
1000.0000 mg | Freq: Once | INTRAVENOUS | Status: AC
  Administered 2020-08-24: 1000 mg via INTRAVENOUS
  Filled 2020-08-24: qty 200

## 2020-08-24 MED ORDER — ACETAMINOPHEN 325 MG PO TABS: 650.0000 mg | ORAL_TABLET | Freq: Four times a day (QID) | ORAL | Status: DC | PRN

## 2020-08-24 MED ORDER — LEVOFLOXACIN IN D5W 500 MG/100ML IV SOLN
500.0000 mg | INTRAVENOUS | Status: DC
  Administered 2020-08-26: 500 mg via INTRAVENOUS
  Filled 2020-08-24: qty 100

## 2020-08-24 MED ORDER — CHLORHEXIDINE GLUCONATE CLOTH 2 % EX PADS
6.0000 | MEDICATED_PAD | Freq: Every day | CUTANEOUS | Status: DC
  Administered 2020-08-24 – 2020-08-27 (×4): 6 via TOPICAL

## 2020-08-24 MED ORDER — ONDANSETRON HCL 4 MG/2ML IJ SOLN: 4.0000 mg | Freq: Four times a day (QID) | INTRAMUSCULAR | Status: DC | PRN

## 2020-08-24 MED ORDER — AMIODARONE HCL IN DEXTROSE 360-4.14 MG/200ML-% IV SOLN
30.0000 mg/h | INTRAVENOUS | Status: DC
  Administered 2020-08-24 – 2020-08-25 (×3): 30 mg/h via INTRAVENOUS
  Filled 2020-08-24 (×2): qty 200

## 2020-08-24 MED ORDER — VANCOMYCIN HCL 750 MG/150ML IV SOLN
750.0000 mg | INTRAVENOUS | Status: DC
  Administered 2020-08-26: 750 mg via INTRAVENOUS
  Filled 2020-08-24: qty 150

## 2020-08-24 MED ORDER — AMIODARONE HCL IN DEXTROSE 360-4.14 MG/200ML-% IV SOLN
60.0000 mg/h | INTRAVENOUS | Status: AC
  Administered 2020-08-24: 60 mg/h via INTRAVENOUS
  Filled 2020-08-24: qty 200

## 2020-08-24 MED ORDER — IOHEXOL 300 MG/ML  SOLN
75.0000 mL | Freq: Once | INTRAMUSCULAR | Status: AC | PRN
  Administered 2020-08-24: 75 mL via INTRAVENOUS

## 2020-08-24 MED ORDER — ENOXAPARIN SODIUM 30 MG/0.3ML ~~LOC~~ SOLN
30.0000 mg | SUBCUTANEOUS | Status: DC
  Administered 2020-08-24 – 2020-08-25 (×2): 30 mg via SUBCUTANEOUS
  Filled 2020-08-24 (×2): qty 0.3

## 2020-08-24 MED ORDER — SODIUM CHLORIDE 0.9 % IV SOLN: 2.0000 g | INTRAVENOUS | Status: DC

## 2020-08-24 MED ORDER — LACTATED RINGERS IV SOLN: INTRAVENOUS | Status: DC

## 2020-08-24 MED ORDER — LORAZEPAM 2 MG/ML IJ SOLN
0.5000 mg | INTRAMUSCULAR | Status: DC | PRN
  Administered 2020-08-24 – 2020-08-26 (×4): 0.5 mg via INTRAVENOUS
  Filled 2020-08-24 (×4): qty 1

## 2020-08-24 MED ORDER — ENOXAPARIN SODIUM 40 MG/0.4ML ~~LOC~~ SOLN: 40.0000 mg | SUBCUTANEOUS | Status: DC

## 2020-08-24 MED ORDER — VANCOMYCIN HCL 500 MG/100ML IV SOLN: 500.0000 mg | INTRAVENOUS | Status: DC

## 2020-08-24 MED ORDER — FUROSEMIDE 10 MG/ML IJ SOLN
40.0000 mg | Freq: Once | INTRAMUSCULAR | Status: AC
  Administered 2020-08-24: 40 mg via INTRAVENOUS
  Filled 2020-08-24: qty 4

## 2020-08-24 MED ORDER — LEVOFLOXACIN IN D5W 750 MG/150ML IV SOLN
750.0000 mg | Freq: Once | INTRAVENOUS | Status: AC
  Administered 2020-08-24: 750 mg via INTRAVENOUS
  Filled 2020-08-24: qty 150

## 2020-08-24 MED ORDER — SODIUM CHLORIDE 0.9 % IV SOLN: 500.0000 mg | INTRAVENOUS | Status: DC

## 2020-08-24 MED ORDER — SODIUM CHLORIDE 0.9 % IV SOLN: INTRAVENOUS | Status: DC

## 2020-08-24 MED ORDER — ONDANSETRON HCL 4 MG PO TABS: 4.0000 mg | ORAL_TABLET | Freq: Four times a day (QID) | ORAL | Status: DC | PRN

## 2020-08-24 NOTE — ED Notes (Signed)
Patient transported to CT 

## 2020-08-24 NOTE — Progress Notes (Signed)
Notified bedside nurse of need to draw repeat lactic acid. 

## 2020-08-24 NOTE — Progress Notes (Signed)
Assessed patient for midline catheter. Patient had PIV placed by ED MD in right upper arm that infiltrated. Patient has left arm restriction due to mastectomy. Patient with moderate to severe swelling to right arm/upper arm. No viable veins found in upper arm due to swelling. Lower arm veins about 3 cms deep due to swelling and very small.  PIV placed right ac. Vascular RN would advise if new PIV access is lost, central line would be needed. [place], RN notified of findings.

## 2020-08-24 NOTE — Progress Notes (Signed)
Second bladder scan on patient showed 180 mL. Will do one more scan on patient before end of my shift and make MD aware. Will continue to monitor.

## 2020-08-24 NOTE — Progress Notes (Signed)
In and out cath performed. 225 mL removed from pt. Will pass on to night RN.

## 2020-08-24 NOTE — ED Triage Notes (Signed)
Family checked on pt around [place] found pt had vomited on self, appeared to have aspirated. Was here a week ago for pneumonia. Pt currently 94% on 5L

## 2020-08-24 NOTE — Progress Notes (Signed)
Pt has had no urine output since on the unit. Bladder scanned patient, only showed 74 mL. Will continue to monitor and scan as necessary.

## 2020-08-24 NOTE — H&P (Addendum)
History and Physical  Patient Name: [name]     OEV:[id number]    DOB: [birth year]    DOA: 08/24/2020 PCP: Iona Beard, MD  Patient coming from: Home  Chief Complaint: Cough, respiratory distress      HPI: [name] is a 86 y.o. F with hx Dementia, home dwelling, wheelchair-bound for over a year, dCHF, Parkinsonism not on Sinemet, HTN, and COPD on 2 L home oxygen who presents with acute respiratory distress.  Patient had recently been admitted, discharged 5 days ago after presenting with fever, tachycardia, elevated lactic acid, and apparent sepsis.  She was treated with IV antibiotics.  She did have paroxysmal atrial fibrillation, started on amiodarone but no anticoagulant.  Urine culture had no growth, blood cultures grew only contaminant, antibiotics were discontinued after 3 days and she was discharged with foley in place for urinary retention.  After returning home, she seemed to be improving for several days, was interactive with family, and taking oral intake, but still very weak.  Family noticed progressively worsening cough at home.  Then on night of admission, she was breathing rapidly, had poor mentation, and had a rattling cough so they brought her to the ER.  In the ER temp 106.56F, heart rate 111, respiratory rate 39.  Lactate 3.3.  Urinalysis with leukocytes.  CXR without focal opacity.  Started on aggressive IV fluids and antibiotics.          ROS: Review of Systems  Unable to perform ROS: Critical illness          Past Medical History:  Diagnosis Date  . Alzheimer's dementia ([place])   . Cancer (HCC)    breast  . CHF (congestive heart failure) ([place])   . Heart attack ([place])   . Hypertension   . Parkinson disease [facility])     Past Surgical History:  Procedure Laterality Date  . ABDOMINAL HYSTERECTOMY    . arm surgery    . BREAST SURGERY    . CHOLECYSTECTOMY    . COLON SURGERY    . HIP PINNING,CANNULATED Right 02/01/2013   Procedure: CANNULATED HIP  PINNING;  Surgeon: Sanjuana Kava, MD;  Location: [facility];  Service: Orthopedics;  Laterality: Right;  . LEG SURGERY    . MASTECTOMY    . PELVIC FRACTURE SURGERY    . small intestine removed    . TONSILLECTOMY      Social History: Patient lives with her daughter.  The patient has been WC bound for >1 year.  Never smoker.  Allergies  Allergen Reactions  . Adhesive [Tape] Rash  . Ensure Pudding [Nutritional Supplements] Swelling and Rash    Throat swelling  . Penicillins Swelling and Rash    Has patient had a PCN reaction causing immediate rash, facial/tongue/throat swelling, SOB or lightheadedness with hypotension: Yes Has patient had a PCN reaction causing severe rash involving mucus membranes or skin necrosis: Yes Has patient had a PCN reaction that required hospitalization No Has patient had a PCN reaction occurring within the last 10 years: No If all of the above answers are "NO", then may proceed with Cephalosporin use.    Family history: family history includes Aneurysm in her mother; Diabetes in her father.  Prior to Admission medications   Medication Sig Start Date End Date Taking? Authorizing Provider  acetaminophen (TYLENOL) 500 MG tablet Take 500 mg by mouth at bedtime. For pain    [provider]  amiodarone (PACERONE) 200 MG tablet Take 1 tablet (200 mg  total) by mouth daily. 08/20/20 09/19/20  Manuella Ghazi, Pratik D, DO  aspirin EC 81 MG tablet Take 1 tablet (81 mg total) by mouth daily. 04/05/13   Kathie Dike, MD  benztropine (COGENTIN) 1 MG tablet Take 1 tablet (1 mg total) by mouth daily. 04/05/13   Kathie Dike, MD  citalopram (CELEXA) 20 MG tablet Take 1 tablet (20 mg total) by mouth every morning. 04/05/13   Kathie Dike, MD  ferrous sulfate 325 (65 FE) MG EC tablet Take 1 tablet (325 mg total) by mouth daily. 03/20/19 08/16/20  Kathie Dike, MD  glycopyrrolate (ROBINUL) 1 MG tablet Take 1 tablet (1 mg total) by mouth 3 (three) times daily. 08/19/20 09/18/20   Manuella Ghazi, Pratik D, DO  ipratropium-albuterol (DUONEB) 0.5-2.5 (3) MG/3ML SOLN Take 3 mLs by nebulization 4 (four) times daily. 09/28/18   Barton Dubois, MD  loratadine (CLARITIN) 10 MG tablet Take 1 tablet (10 mg total) by mouth daily as needed for allergies. Patient not taking: Reported on 08/16/2020 09/28/18 09/28/19  Barton Dubois, MD  metoprolol (LOPRESSOR) 50 MG tablet Take 0.5 tablets (25 mg total) by mouth 2 (two) times daily. 09/24/13   Kathie Dike, MD  pantoprazole (PROTONIX) 40 MG tablet Take 1 tablet (40 mg total) by mouth daily. 03/20/19 08/16/20  Kathie Dike, MD  rivastigmine (EXELON) 9.5 mg/24hr Place 1 patch (9.5 mg total) onto the skin daily. *Remove and discard used patches* 04/05/13   Kathie Dike, MD  zolpidem (AMBIEN) 10 MG tablet Take 0.5 tablets (5 mg total) by mouth at bedtime as needed for sleep. 03/20/19   Kathie Dike, MD       Physical Exam: BP (!) 119/92 (BP Location: Left Leg)   Pulse (!) 104   Temp (!) 104.7 F (40.4 C) (Bladder)   Resp (!) 22   SpO2 95%  General appearance: Frail elderly adult female, obtunded.  In respiratory distress. ENT: No nasal deformity, discharge, epistaxis.  Edentulous, oropharynx dry, lips normal Skin: Warm and dry.  No jaundice.  No suspicious rashes or lesions. Cardiac:, Irregular, nl S1-S2, no murmurs appreciated.  Capillary refill is brisk.  JVP not visible.  Nonpitting lower extremity edema, lymphedema of left arm, mild pitting edema of the right arm.  Radial pulses 2+ and symmetric. Respiratory: Tachypneic, coarse rhonchi bilaterally, no wheezing.  Respiratory effort dramatically increased. Abdomen: Abdomen rigid. No ascites, distension, hepatosplenomegaly.   MSK: Severe diffuse loss of subcutaneous muscle mass and fat.  No cyanosis or clubbing. Neuro: Obtunded, pushes me away on exam, does not cooperate or follow commands.  No spontaneous verbalizations.    Psych: Obtunded.     Labs on Admission:  I have personally  reviewed following labs and imaging studies: CBC: Recent Labs  Lab 08/19/20 0337 08/24/20 0447  WBC 10.8* 34.4*  NEUTROABS  --  30.5*  HGB 10.8* 12.3  HCT 36.8 41.0  MCV 76.3* 75.8*  PLT 234 034   Basic Metabolic Panel: Recent Labs  Lab 08/18/20 0541 08/19/20 0337 08/24/20 0447  NA  --  143 151*  K  --  4.2 3.6  CL  --  109 116*  CO2  --  25 24  GLUCOSE  --  183* 195*  BUN  --  38* 37*  CREATININE 0.80 1.09* 1.50*  CALCIUM  --  8.5* 8.4*  MG  --  2.4  --    GFR: Estimated Creatinine Clearance: 18.8 mL/min (A) (by C-G formula based on SCr of 1.5 mg/dL (H)).  Liver Function Tests:  Recent Labs  Lab 08/24/20 0447  AST 23  ALT 24  ALKPHOS 51  BILITOT 1.0  PROT 7.3  ALBUMIN 3.3*    Coagulation Profile: Recent Labs  Lab 08/24/20 0447  INR 1.3*   Sepsis Labs: Lactic acid 3.3  Recent Results (from the past 240 hour(s))  Blood Culture (routine x 2)     Status: None   Collection Time: 08/16/20  1:00 PM   Specimen: Right Antecubital; Blood  Result Value Ref Range Status   Specimen Description   Final    RIGHT ANTECUBITAL BOTTLES DRAWN AEROBIC AND ANAEROBIC   Special Requests Blood Culture adequate volume  Final   Culture   Final    NO GROWTH 5 DAYS Performed at [facility], [address], [place], [place] [address]    Report Status 08/21/2020 FINAL  Final  Blood Culture (routine x 2)     Status: Abnormal   Collection Time: 08/16/20  1:05 PM   Specimen: BLOOD RIGHT WRIST  Result Value Ref Range Status   Specimen Description   Final    BLOOD RIGHT WRIST BOTTLES DRAWN AEROBIC AND ANAEROBIC Performed at [facility], [address], [place], [place] [address]    Special Requests   Final    Blood Culture adequate volume Performed at [facility], [address] [place], [place] [address]    Culture  Setup Time   Final    GRAM POSITIVE COCCI IN BOTH AEROBIC AND ANAEROBIC BOTTLES Gram Stain Report Called to,Read Back By and Verified With: MICHAEL DOSS  @0830  08/17/20 BY JONES,T [facility] Organism ID to follow    Culture (A)  Final    STAPHYLOCOCCUS HOMINIS THE SIGNIFICANCE OF ISOLATING THIS ORGANISM FROM A SINGLE SET OF BLOOD CULTURES WHEN MULTIPLE SETS ARE DRAWN IS UNCERTAIN. PLEASE NOTIFY THE MICROBIOLOGY DEPARTMENT WITHIN ONE WEEK IF SPECIATION AND SENSITIVITIES ARE REQUIRED. Performed at [facility], [place] [address] [place], [place] [address]    Report Status 08/19/2020 FINAL  Final  Blood Culture ID Panel (Reflexed)     Status: Abnormal   Collection Time: 08/16/20  1:05 PM  Result Value Ref Range Status   Enterococcus faecalis NOT DETECTED NOT DETECTED Final   Enterococcus Faecium NOT DETECTED NOT DETECTED Final   Listeria monocytogenes NOT DETECTED NOT DETECTED Final   Staphylococcus species DETECTED (A) NOT DETECTED Final    Comment: CRITICAL RESULT CALLED TO, READ BACK BY AND VERIFIED WITH: Gloris Manchester PharmD 14:30 08/17/20 ([email or web address])    Staphylococcus aureus (BCID) NOT DETECTED NOT DETECTED Final   Staphylococcus epidermidis NOT DETECTED NOT DETECTED Final   Staphylococcus lugdunensis NOT DETECTED NOT DETECTED Final   Streptococcus species NOT DETECTED NOT DETECTED Final   Streptococcus agalactiae NOT DETECTED NOT DETECTED Final   Streptococcus pneumoniae NOT DETECTED NOT DETECTED Final   Streptococcus pyogenes NOT DETECTED NOT DETECTED Final   A.calcoaceticus-baumannii NOT DETECTED NOT DETECTED Final   Bacteroides fragilis NOT DETECTED NOT DETECTED Final   Enterobacterales NOT DETECTED NOT DETECTED Final   Enterobacter cloacae complex NOT DETECTED NOT DETECTED Final   Escherichia coli NOT DETECTED NOT DETECTED Final   Klebsiella aerogenes NOT DETECTED NOT DETECTED Final   Klebsiella oxytoca NOT DETECTED NOT DETECTED Final   Klebsiella pneumoniae NOT DETECTED NOT DETECTED Final   Proteus species NOT DETECTED NOT DETECTED Final   Salmonella species NOT DETECTED NOT DETECTED Final   Serratia marcescens NOT DETECTED NOT  DETECTED Final   Haemophilus influenzae NOT DETECTED NOT DETECTED Final  Neisseria meningitidis NOT DETECTED NOT DETECTED Final   Pseudomonas aeruginosa NOT DETECTED NOT DETECTED Final   Stenotrophomonas maltophilia NOT DETECTED NOT DETECTED Final   Candida albicans NOT DETECTED NOT DETECTED Final   Candida auris NOT DETECTED NOT DETECTED Final   Candida glabrata NOT DETECTED NOT DETECTED Final   Candida krusei NOT DETECTED NOT DETECTED Final   Candida parapsilosis NOT DETECTED NOT DETECTED Final   Candida tropicalis NOT DETECTED NOT DETECTED Final   Cryptococcus neoformans/gattii NOT DETECTED NOT DETECTED Final    Comment: Performed at [facility], [place] [address], [place], [place] [address]  Urine culture     Status: Abnormal   Collection Time: 08/16/20  2:49 PM   Specimen: Urine, Catheterized  Result Value Ref Range Status   Specimen Description   Final    URINE, CATHETERIZED Performed at [facility], [address] [place], [place] [address]    Special Requests   Final    NONE Performed at [facility], [address], [place], [place] [address]    Culture (A)  Final    <10,000 COLONIES/mL INSIGNIFICANT GROWTH Performed at [facility] [address], [place], [place] [address]    Report Status 08/18/2020 FINAL  Final  Resp Panel by RT-PCR (Flu A&B, Covid) Nasopharyngeal Swab     Status: None   Collection Time: 08/16/20  3:17 PM   Specimen: Nasopharyngeal Swab; Nasopharyngeal(NP) swabs in vial transport medium  Result Value Ref Range Status   SARS Coronavirus 2 by RT PCR NEGATIVE NEGATIVE Final    Comment: (NOTE) SARS-CoV-2 target nucleic acids are NOT DETECTED.  The SARS-CoV-2 RNA is generally detectable in upper respiratory specimens during the acute phase of infection. The lowest concentration of SARS-CoV-2 viral copies this assay can detect is 138 copies/mL. A negative result does not preclude SARS-Cov-2 infection and should not be used as the sole  basis for treatment or other patient management decisions. A negative result may occur with  improper specimen collection/handling, submission of specimen other than nasopharyngeal swab, presence of viral mutation(s) within the areas targeted by this assay, and inadequate number of viral copies(<138 copies/mL). A negative result must be combined with clinical observations, patient history, and epidemiological information. The expected result is Negative.  Fact Sheet for Patients:  EntrepreneurPulse.com.au  Fact Sheet for Healthcare Providers:  IncredibleEmployment.be  This test is no t yet approved or cleared by the [place] FDA and  has been authorized for detection and/or diagnosis of SARS-CoV-2 by FDA under an Emergency Use Authorization (EUA). This EUA will remain  in effect (meaning this test can be used) for the duration of the COVID-19 declaration under Section 564(b)(1) of the Act, 21 U.S.C.section 360bbb-3(b)(1), unless the authorization is terminated  or revoked sooner.       Influenza A by PCR NEGATIVE NEGATIVE Final   Influenza B by PCR NEGATIVE NEGATIVE Final    Comment: (NOTE) The Xpert Xpress SARS-CoV-2/FLU/RSV plus assay is intended as an aid in the diagnosis of influenza from Nasopharyngeal swab specimens and should not be used as a sole basis for treatment. Nasal washings and aspirates are unacceptable for Xpert Xpress SARS-CoV-2/FLU/RSV testing.  Fact Sheet for Patients: EntrepreneurPulse.com.au  Fact Sheet for Healthcare Providers: IncredibleEmployment.be  This test is not yet approved or cleared by the [place] FDA and has been authorized for detection and/or diagnosis of SARS-CoV-2 by FDA under an Emergency Use Authorization (EUA). This EUA will remain in effect (meaning this test can  be used) for the duration of the COVID-19 declaration under Section 564(b)(1) of the Act,  21 U.S.C. section 360bbb-3(b)(1), unless the authorization is terminated or revoked.  Performed at [facility], [address] [place], [place] [address]   Culture, blood (routine x 2)     Status: None   Collection Time: 08/18/20  9:04 AM   Specimen: BLOOD  Result Value Ref Range Status   Specimen Description BLOOD BLOOD RIGHT HAND  Final   Special Requests   Final    BOTTLES DRAWN AEROBIC AND ANAEROBIC Blood Culture adequate volume   Culture   Final    NO GROWTH 5 DAYS Performed at [facility], [address] [place], [place] [address]    Report Status 08/23/2020 FINAL  Final  Blood Culture (routine x 2)     Status: None (Preliminary result)   Collection Time: 08/24/20  4:47 AM   Specimen: BLOOD  Result Value Ref Range Status   Specimen Description BLOOD RIGHT ARM  Final   Special Requests   Final    BOTTLES DRAWN AEROBIC AND ANAEROBIC Blood Culture adequate volume Performed at [facility], [address], [place], [place] [address]    Culture PENDING  Incomplete   Report Status PENDING  Incomplete  Resp Panel by RT-PCR (Flu A&B, Covid) Nasopharyngeal Swab     Status: None   Collection Time: 08/24/20  5:29 AM   Specimen: Nasopharyngeal Swab; Nasopharyngeal(NP) swabs in vial transport medium  Result Value Ref Range Status   SARS Coronavirus 2 by RT PCR NEGATIVE NEGATIVE Final    Comment: (NOTE) SARS-CoV-2 target nucleic acids are NOT DETECTED.  The SARS-CoV-2 RNA is generally detectable in upper respiratory specimens during the acute phase of infection. The lowest concentration of SARS-CoV-2 viral copies this assay can detect is 138 copies/mL. A negative result does not preclude SARS-Cov-2 infection and should not be used as the sole basis for treatment or other patient management decisions. A negative result may occur with  improper specimen collection/handling, submission of specimen other than nasopharyngeal swab, presence of viral mutation(s) within the areas  targeted by this assay, and inadequate number of viral copies(<138 copies/mL). A negative result must be combined with clinical observations, patient history, and epidemiological information. The expected result is Negative.  Fact Sheet for Patients:  EntrepreneurPulse.com.au  Fact Sheet for Healthcare Providers:  IncredibleEmployment.be  This test is no t yet approved or cleared by the [place] FDA and  has been authorized for detection and/or diagnosis of SARS-CoV-2 by FDA under an Emergency Use Authorization (EUA). This EUA will remain  in effect (meaning this test can be used) for the duration of the COVID-19 declaration under Section 564(b)(1) of the Act, 21 U.S.C.section 360bbb-3(b)(1), unless the authorization is terminated  or revoked sooner.       Influenza A by PCR NEGATIVE NEGATIVE Final   Influenza B by PCR NEGATIVE NEGATIVE Final    Comment: (NOTE) The Xpert Xpress SARS-CoV-2/FLU/RSV plus assay is intended as an aid in the diagnosis of influenza from Nasopharyngeal swab specimens and should not be used as a sole basis for treatment. Nasal washings and aspirates are unacceptable for Xpert Xpress SARS-CoV-2/FLU/RSV testing.  Fact Sheet for Patients: EntrepreneurPulse.com.au  Fact Sheet for Healthcare Providers: IncredibleEmployment.be  This test is not yet approved or cleared by the [place] FDA and has been authorized for detection and/or diagnosis of SARS-CoV-2 by FDA under an Emergency Use Authorization (EUA). This EUA will remain in effect (meaning this  test can be used) for the duration of the COVID-19 declaration under Section 564(b)(1) of the Act, 21 U.S.C. section 360bbb-3(b)(1), unless the authorization is terminated or revoked.  Performed at [facility], [address] [place], [place] [address]            Radiological Exams on Admission: Personally reviewed chest  x-ray shows cardiomegaly, patchy likely atelectasis bilaterally; CT abdomen report reviewed: CT ABDOMEN PELVIS WO CONTRAST  Result Date: 08/24/2020 CLINICAL DATA:  Abdominal pain, coughing up brown stuff, trouble breathing, severe dementia, unable to provide additional history; past history of breast cancer, CHF, hypertension, Alzheimer's, Parkinson's, COPD EXAM: CT ABDOMEN AND PELVIS WITHOUT CONTRAST TECHNIQUE: Multidetector CT imaging of the abdomen and pelvis was performed following the standard protocol without IV contrast. Sagittal and coronal MPR images reconstructed from axial data set. IV contrast was administered but the bulk of the contrast dosed extravasated in the RIGHT upper extremity; routine post extravasation orders provided. No oral contrast. Beam hardening artifacts in upper abdomen due to patient's arms and extravasated contrast material. COMPARISON:  08/16/2020 FINDINGS: Lower chest: Bibasilar infiltrates and atelectasis. Enlargement of cardiac chambers. Coronary arterial and aortic calcifications. Hepatobiliary: Numerous hepatic cysts, largest 9.5 x 7.4 cm image 24. No other gross hepatic or gallbladder abnormality, though significant artifacts are present. Pancreas: Normal appearance Spleen: Grossly normal appearance Adrenals/Urinary Tract: Adrenal thickening without mass. Cyst at upper pole LEFT kidney unchanged. No additional renal mass or hydronephrosis. Bladder decompressed. Stomach/Bowel: Increased stool and gas within rectum. Prior ileocolic resection. Lipoma at distal descending colon 2.4 x 1.6 cm image 61. Stomach and remaining bowel loops unremarkable. Vascular/Lymphatic: Extensive atherosclerotic calcifications aorta and iliac arteries without aneurysm. No adenopathy. Reproductive: Uterus surgically absent with nonvisualization of ovaries Other: No free air or free fluid. Small RIGHT inguinal hernia containing fat. Musculoskeletal: Osseous demineralization. Cannulated screws at  proximal RIGHT femur. Old posttraumatic deformity of LEFT pubis. Chronic compression fracture of L1. Extravasated contrast material at RIGHT arm. IMPRESSION: Increased stool and gas within rectum. Hepatic and LEFT renal cysts. Small RIGHT inguinal hernia containing fat. Bibasilar pulmonary infiltrates and atelectasis. No acute intra-abdominal or intrapelvic abnormalities. Aortic Atherosclerosis (ICD10-I70.0). Electronically Signed   By: Lavonia Dana M.D.   On: 08/24/2020 13:38   DG Chest Port 1 View  Result Date: 08/24/2020 CLINICAL DATA:  Questionable sepsis EXAM: PORTABLE CHEST 1 VIEW COMPARISON:  Is 08/17/2020 FINDINGS: Cardiomegaly and aortic tortuosity. No aortic aneurysm by recent CT. Stable accentuated markings with scar-like appearance and atelectasis by recent CT. There is no edema, consolidation, effusion, or pneumothorax. Artifact from EKG leads. IMPRESSION: Stable from prior.  No acute finding. Electronically Signed   By: Monte Fantasia M.D.   On: 08/24/2020 05:43    EKG: Independently reviewed.  Atrial fibrillation, tachycardia.  No ST changes.       Assessment/Plan   Severe sepsis due to likely pneumonia Patient presented with leukocytosis, fever, tachycardia, tachypnea, and encephalopathy and acute kidney injury.  Possible sources are urine or pneumonia, unclear at this time.  Her fever is remarkably high at 106, rectal.  Other considerations for causes of fever this high include hypothalamic stroke, serotonin syndrome.  Hyperthyroidism ruled out.  NMS not likely (not on neuroleptic).  Perforated abdomen ruled out with CT.    -Continue vancomycin and Levaquin -Continue IV fluids  -Hold SSRI -Low dose benzodiazepines for severe agitation   AKI Mild, Cr 1.5 up from baseline 1.0.  Due to sepsis. -IV fluids and monitor  Dementia Parkinsonism -Hold  Cogentin -Hold SSRI  Paroxysmal atrial fibrillation -Continue amiodarone -Hold metoprolol given hypotension  COPD No  wheezing -Albuterol as able -Attempted RT chest physiotherapy today, patient was too ill to comply  Chronic diastolic CHF Chronic respiratory faliure with hypoxia on 2L O2 at home Overall appears septic more than in congestive heart failure. -Continue fluids -Close attention to fluid status         DVT prophylaxis: Lovenox  Code Status: DNR  Family Communication: Daughters and son at bedside.  Guarded prognosis shared.  DNR confirmed.  Disposition Plan: Anticipate IV fluids and IV antibiotics. If this is sepsis or serotonin syndrome, it may improve with current therapies (supportive care, fluids, benzos), but family are aware she likely will deteriorate despite all current aggressive measures, given her age and poor functional reserve.   If this is hypothalamic stroke, patient will continue to deteriorate, no therapies will improve outcome.  Plan for re-evaluation in 24 hours.  If no improvement in 24 hours, will recommend comfort measures.   Consults called: None Admission status: INPATIENT      Medical decision making: Patient seen at 11:03 AM on 08/24/2020.  The patient was discussed with Dr. Dayna Barker.  What exists of the patient's chart was reviewed in depth and summarized above.  Clinical condition: respiratory distress but SpO2 stable on supplemental O2.  Mentation poor, tachycardic, BP stable.    The patient is critically ill with multi-organ failure.  Critical care was necessary to treat or prevent imminent or life-threatening deterioration of sepsis, respiratory failure, encephalopathy, renal failure and was exclusive of separately billable procedures and treating other patients. Total critical care time spent by me: 70 minutes Time spent personally by me on obtaining history from patient or surrogate, evaluation of the patient, evaluation of patient's response to treatment, ordering and review of laboratory studies, ordering and review of radiographic studies, ordering  and performing treatments and interventions, and re-evaluation of the patient's condition.         [facility] Please page though [place] or Epic secure chat:  For password, contact charge nurse

## 2020-08-24 NOTE — Progress Notes (Signed)
Pharmacy Antibiotic Note  [name] is a 86 y.o. female admitted on 08/24/2020 with pneumonia.  Pharmacy has been consulted for Vancomycin/Levaquin dosing. WBC mildly elevated. Noted bump in Scr this AM.   Plan: Vancomycin 750 mg IV q48h Levaquin 500 mg IV q48h Trend WBC, temp, renal function  F/U infectious work-up Drug levels as indicated  Temp (24hrs), Avg:105.9 F (41.1 C), Min:105.4 F (40.8 C), Max:106.2 F (41.2 C)  Recent Labs  Lab 08/17/20 0819 08/18/20 0541 08/19/20 0337 08/24/20 0447  WBC 16.7*  --  10.8*  --   CREATININE 0.75 0.80 1.09* 1.50*  LATICACIDVEN  --   --   --  3.3*    Estimated Creatinine Clearance: 18.8 mL/min (A) (by C-G formula based on SCr of 1.5 mg/dL (H)).    Allergies  Allergen Reactions  . Adhesive [Tape] Rash  . Ensure Pudding [Nutritional Supplements] Swelling and Rash    Throat swelling  . Penicillins Swelling and Rash    Has patient had a PCN reaction causing immediate rash, facial/tongue/throat swelling, SOB or lightheadedness with hypotension: Yes Has patient had a PCN reaction causing severe rash involving mucus membranes or skin necrosis: Yes Has patient had a PCN reaction that required hospitalization No Has patient had a PCN reaction occurring within the last 10 years: No If all of the above answers are "NO", then may proceed with Cephalosporin use.    Narda Bonds, PharmD, BCPS Clinical Pharmacist Phone: [phone]

## 2020-08-24 NOTE — Progress Notes (Signed)
6:22 AM I was called to admit patient who presents to the emergency department with severe sepsis of unknown source at this time (though suspected to be due to aspiration).  Chest x-ray showed no acute finding. She was febrile, tachycardic and tachypneic.  Lactic acid 3.3.  However, CBC was yet to be done.  Admission was deferred pending CBC. Flow manager will be texted when lab is ready.  This has already been communicated with the flow manager.

## 2020-08-24 NOTE — Sepsis Progress Note (Signed)
Notified bedside nurse of need to draw repeat lactic acid. 

## 2020-08-24 NOTE — Progress Notes (Signed)
Pt oxygen saturation is adequate at 95-100% on a regular nasal cannula, yet patient is a heavy mouth breather and RR are high. For patient's comfort a NRB mask placed to help ease breathing. Will continue to monitor.

## 2020-08-24 NOTE — ED Provider Notes (Signed)
[facility] EMERGENCY DEPARTMENT Provider Note   CSN: [id number] Arrival date & time: 08/24/20  0430     History No chief complaint on file.   [name] is a 86 y.o. female.  86 year old female who is DNR presents to the emergency department today secondary to respiratory distress.  Her daughter is with her.  She states that she went to check on her and she was coughing up brown stuff and seemed to be having trouble breathing so she called EMS.  Patient has severe dementia Parkinson's disease, heart failure breast cancer.  Patient is not able to offer any history at this time.  Does appear on review of the records the patient has similar presentation last time and improved with antibiotics without a source being found it was thought to be pulmonary even though CT and chest x-rays were negative at that time.  She came in today with a temp Foley in place.        Past Medical History:  Diagnosis Date  . Alzheimer's dementia ([place])   . Cancer (HCC)    breast  . CHF (congestive heart failure) ([place])   . Heart attack ([place])   . Hypertension   . Parkinson disease [facility])     Patient Active Problem List   Diagnosis Date Noted  . Sepsis ([place]) 08/24/2020  . Severe sepsis ([place]) 08/16/2020  . Closed fracture of distal end of right femur, initial encounter ([place]) 03/18/2019  . Acute on chronic respiratory failure ([place]) 09/26/2018  . Acute on chronic diastolic CHF (congestive heart failure) ([place]) 04/28/2018  . Dysarthria 10/18/2017  . Acute metabolic encephalopathy 62/69/4854  . Chronic diastolic CHF (congestive heart failure) ([place]) 10/17/2017  . Fever   . COPD with acute bronchitis ([place]) 10/16/2017  . Dyspnea 01/06/2015  . Essential hypertension 01/06/2015  . Depression with anxiety 01/06/2015  . Insomnia 01/06/2015  . Acute on chronic diastolic congestive heart failure ([place])   . Lesion of liver 09/17/2013  . Microcytic anemia 04/05/2013  . Elevated troponin 04/04/2013  . Fall  01/31/2013  . Dementia with behavioral disturbance ([place]) 01/31/2013  . Parkinson disease ([place]) 01/31/2013  . Community acquired pneumonia 06/30/2012  . Weakness generalized 06/30/2012    Past Surgical History:  Procedure Laterality Date  . ABDOMINAL HYSTERECTOMY    . arm surgery    . BREAST SURGERY    . CHOLECYSTECTOMY    . COLON SURGERY    . HIP PINNING,CANNULATED Right 02/01/2013   Procedure: CANNULATED HIP PINNING;  Surgeon: Sanjuana Kava, MD;  Location: [facility];  Service: Orthopedics;  Laterality: Right;  . LEG SURGERY    . MASTECTOMY    . PELVIC FRACTURE SURGERY    . small intestine removed    . TONSILLECTOMY       OB History    Gravida  4   Para  4   Term  4   Preterm      AB      Living        SAB      TAB      Ectopic      Multiple      Live Births              Family History  Problem Relation Age of Onset  . Aneurysm Mother   . Diabetes Father     Social History   Tobacco Use  . Smoking status: Never Smoker  . Smokeless tobacco: Never Used  Vaping Use  .  Vaping Use: Never used  Substance Use Topics  . Alcohol use: No  . Drug use: No    Home Medications Prior to Admission medications   Medication Sig Start Date End Date Taking? Authorizing Provider  acetaminophen (TYLENOL) 500 MG tablet Take 500 mg by mouth at bedtime. For pain    [provider]  amiodarone (PACERONE) 200 MG tablet Take 1 tablet (200 mg total) by mouth daily. 08/20/20 09/19/20  Manuella Ghazi, Pratik D, DO  aspirin EC 81 MG tablet Take 1 tablet (81 mg total) by mouth daily. 04/05/13   Kathie Dike, MD  benztropine (COGENTIN) 1 MG tablet Take 1 tablet (1 mg total) by mouth daily. 04/05/13   Kathie Dike, MD  citalopram (CELEXA) 20 MG tablet Take 1 tablet (20 mg total) by mouth every morning. 04/05/13   Kathie Dike, MD  ferrous sulfate 325 (65 FE) MG EC tablet Take 1 tablet (325 mg total) by mouth daily. 03/20/19 08/16/20  Kathie Dike, MD  glycopyrrolate  (ROBINUL) 1 MG tablet Take 1 tablet (1 mg total) by mouth 3 (three) times daily. 08/19/20 09/18/20  Manuella Ghazi, Pratik D, DO  ipratropium-albuterol (DUONEB) 0.5-2.5 (3) MG/3ML SOLN Take 3 mLs by nebulization 4 (four) times daily. 09/28/18   Barton Dubois, MD  loratadine (CLARITIN) 10 MG tablet Take 1 tablet (10 mg total) by mouth daily as needed for allergies. Patient not taking: Reported on 08/16/2020 09/28/18 09/28/19  Barton Dubois, MD  metoprolol (LOPRESSOR) 50 MG tablet Take 0.5 tablets (25 mg total) by mouth 2 (two) times daily. 09/24/13   Kathie Dike, MD  pantoprazole (PROTONIX) 40 MG tablet Take 1 tablet (40 mg total) by mouth daily. 03/20/19 08/16/20  Kathie Dike, MD  predniSONE (DELTASONE) 20 MG tablet Take 2 tablets (40 mg total) by mouth daily for 5 days. 08/19/20 08/24/20  Manuella Ghazi, Pratik D, DO  rivastigmine (EXELON) 9.5 mg/24hr Place 1 patch (9.5 mg total) onto the skin daily. *Remove and discard used patches* 04/05/13   Kathie Dike, MD  zolpidem (AMBIEN) 10 MG tablet Take 0.5 tablets (5 mg total) by mouth at bedtime as needed for sleep. 03/20/19   Kathie Dike, MD    Allergies    Adhesive [tape], Ensure pudding [nutritional supplements], and Penicillins  Review of Systems   Review of Systems  Unable to perform ROS: Dementia    Physical Exam Updated Vital Signs BP 98/85   Pulse 96   Temp (!) 104.5 F (40.3 C)   Resp (!) 37   SpO2 95%   Physical Exam Vitals and nursing note reviewed.  Constitutional:      General: She is not in acute distress.    Appearance: She is well-developed.  HENT:     Head: Normocephalic and atraumatic.     Nose: No congestion or rhinorrhea.     Mouth/Throat:     Mouth: Mucous membranes are dry.  Eyes:     Conjunctiva/sclera: Conjunctivae normal.  Cardiovascular:     Rate and Rhythm: Regular rhythm. Tachycardia present.     Heart sounds: No murmur heard.   Pulmonary:     Effort: Tachypnea present. No respiratory distress.     Breath sounds:  Rhonchi and rales present.  Abdominal:     General: There is no distension.     Palpations: Abdomen is soft. There is no mass.     Tenderness: There is no abdominal tenderness.     Hernia: No hernia is present.  Musculoskeletal:  General: No swelling or tenderness. Normal range of motion.     Cervical back: Neck supple.  Skin:    General: Skin is warm and dry.  Neurological:     Mental Status: She is alert. Mental status is at baseline. She is disoriented.     ED Results / Procedures / Treatments   Labs (all labs ordered are listed, but only abnormal results are displayed) Labs Reviewed  LACTIC ACID, PLASMA - Abnormal; Notable for the following components:      Result Value   Lactic Acid, Venous 3.3 (*)    All other components within normal limits  COMPREHENSIVE METABOLIC PANEL - Abnormal; Notable for the following components:   Sodium 151 (*)    Chloride 116 (*)    Glucose, Bld 195 (*)    BUN 37 (*)    Creatinine, Ser 1.50 (*)    Calcium 8.4 (*)    Albumin 3.3 (*)    GFR, Estimated 31 (*)    All other components within normal limits  CBC WITH DIFFERENTIAL/PLATELET - Abnormal; Notable for the following components:   WBC 34.4 (*)    RBC 5.41 (*)    MCV 75.8 (*)    MCH 22.7 (*)    RDW 19.7 (*)    Neutro Abs 30.5 (*)    Monocytes Absolute 1.9 (*)    Abs Immature Granulocytes 0.97 (*)    All other components within normal limits  PROTIME-INR - Abnormal; Notable for the following components:   Prothrombin Time 15.4 (*)    INR 1.3 (*)    All other components within normal limits  URINALYSIS, ROUTINE W REFLEX MICROSCOPIC - Abnormal; Notable for the following components:   APPearance CLOUDY (*)    Hgb urine dipstick MODERATE (*)    Protein, ur >=300 (*)    Leukocytes,Ua MODERATE (*)    WBC, UA >50 (*)    All other components within normal limits  RESP PANEL BY RT-PCR (FLU A&B, COVID) ARPGX2  CULTURE, BLOOD (ROUTINE X 2)  CULTURE, BLOOD (ROUTINE X 2)  URINE  CULTURE  APTT  LACTIC ACID, PLASMA    EKG EKG Interpretation  Date/Time:  Tuesday August 24 2020 05:04:25 EST Ventricular Rate:  117 PR Interval:    QRS Duration: 91 QT Interval:  302 QTC Calculation: 390 R Axis:   50 Text Interpretation: Atrial fibrillation Ventricular premature complex Probable LVH with secondary repol abnrm improved rate from previous morphologically similar Confirmed by Merrily Pew [phone]) on 08/24/2020 8:10:40 AM   Radiology DG Chest Port 1 View  Result Date: 08/24/2020 CLINICAL DATA:  Questionable sepsis EXAM: PORTABLE CHEST 1 VIEW COMPARISON:  Is 08/17/2020 FINDINGS: Cardiomegaly and aortic tortuosity. No aortic aneurysm by recent CT. Stable accentuated markings with scar-like appearance and atelectasis by recent CT. There is no edema, consolidation, effusion, or pneumothorax. Artifact from EKG leads. IMPRESSION: Stable from prior.  No acute finding. Electronically Signed   By: Monte Fantasia M.D.   On: 08/24/2020 05:43    Procedures .Critical Care Performed by: Merrily Pew, MD Authorized by: Merrily Pew, MD   Critical care provider statement:    Critical care time (minutes):  45   Critical care was necessary to treat or prevent imminent or life-threatening deterioration of the following conditions:  Sepsis   Critical care was time spent personally by me on the following activities:  Discussions with consultants, evaluation of patient's response to treatment, examination of patient, ordering and performing treatments and interventions, ordering and review  of laboratory studies, ordering and review of radiographic studies, pulse oximetry, re-evaluation of patient's condition, obtaining history from patient or surrogate and review of old charts   (including critical care time)  Medications Ordered in ED Medications  lactated ringers infusion ( Intravenous New Bag/Given 08/24/20 0530)  levofloxacin (LEVAQUIN) IVPB 500 mg (has no administration in  time range)  vancomycin (VANCOREADY) IVPB 750 mg/150 mL (has no administration in time range)  lactated ringers bolus 1,000 mL (0 mLs Intravenous Stopped 08/24/20 0614)    And  lactated ringers bolus 1,000 mL (0 mLs Intravenous Stopped 08/24/20 0614)    And  lactated ringers bolus 250 mL (0 mLs Intravenous Stopped 08/24/20 0614)  levofloxacin (LEVAQUIN) IVPB 750 mg (750 mg Intravenous New Bag/Given 08/24/20 0535)  vancomycin (VANCOCIN) IVPB 1000 mg/200 mL premix (1,000 mg Intravenous New Bag/Given 08/24/20 0534)    ED Course  I have reviewed the triage vital signs and the nursing notes.  Pertinent labs & imaging results that were available during my care of the patient were reviewed by me and considered in my medical decision making (see chart for details).    MDM Rules/Calculators/A&P                          Code sepsis on arrival secondary to fever, tachycardia, tachypnea.  Patient's white count to be at 38.  Antibiotics administered.  Fluids administered.  Blood negative.  Suspect pulmonary source with her increased work of breathing rales or rhonchi however urine does appear he could be infected as well.  Blood cultures drawn urine cultures done.  Discussed with Dr. Loleta Books with hospitalist who agrees to admit and assume care from here  Final Clinical Impression(s) / ED Diagnoses Final diagnoses:  Hypoxia  Fever, unspecified fever cause    Rx / DC Orders ED Discharge Orders    None       Aaliayah Miao, Corene Cornea, MD 08/24/20 [phone]

## 2020-08-24 NOTE — ED Notes (Signed)
Unable to obtain IV access. MD Mesner notified.

## 2020-08-24 NOTE — Sepsis Progress Note (Signed)
Following for code sepsis 

## 2020-08-25 DIAGNOSIS — G301 Alzheimer's disease with late onset: Secondary | ICD-10-CM

## 2020-08-25 DIAGNOSIS — Z7189 Other specified counseling: Secondary | ICD-10-CM

## 2020-08-25 DIAGNOSIS — J9620 Acute and chronic respiratory failure, unspecified whether with hypoxia or hypercapnia: Secondary | ICD-10-CM

## 2020-08-25 DIAGNOSIS — G9341 Metabolic encephalopathy: Secondary | ICD-10-CM

## 2020-08-25 DIAGNOSIS — F0281 Dementia in other diseases classified elsewhere with behavioral disturbance: Secondary | ICD-10-CM

## 2020-08-25 LAB — COMPREHENSIVE METABOLIC PANEL
ALT: 23 U/L (ref 0–44)
AST: 26 U/L (ref 15–41)
Albumin: 2.7 g/dL — ABNORMAL LOW (ref 3.5–5.0)
Alkaline Phosphatase: 43 U/L (ref 38–126)
Anion gap: 9 (ref 5–15)
BUN: 42 mg/dL — ABNORMAL HIGH (ref 8–23)
CO2: 21 mmol/L — ABNORMAL LOW (ref 22–32)
Calcium: 7.5 mg/dL — ABNORMAL LOW (ref 8.9–10.3)
Chloride: 119 mmol/L — ABNORMAL HIGH (ref 98–111)
Creatinine, Ser: 1.13 mg/dL — ABNORMAL HIGH (ref 0.44–1.00)
GFR, Estimated: 44 mL/min — ABNORMAL LOW (ref >60)
Glucose, Bld: 150 mg/dL — ABNORMAL HIGH (ref 70–99)
Potassium: 3.3 mmol/L — ABNORMAL LOW (ref 3.5–5.1)
Sodium: 149 mmol/L — ABNORMAL HIGH (ref 135–145)
Total Bilirubin: 0.5 mg/dL (ref 0.3–1.2)
Total Protein: 6.2 g/dL — ABNORMAL LOW (ref 6.5–8.1)

## 2020-08-25 LAB — CBC
HCT: 35.7 % — ABNORMAL LOW (ref 36.0–46.0)
Hemoglobin: 10.9 g/dL — ABNORMAL LOW (ref 12.0–15.0)
MCH: 22.6 pg — ABNORMAL LOW (ref 26.0–34.0)
MCHC: 30.5 g/dL (ref 30.0–36.0)
MCV: 73.9 fL — ABNORMAL LOW (ref 80.0–100.0)
Platelets: 201 10*3/uL (ref 150–400)
RBC: 4.83 MIL/uL (ref 3.87–5.11)
RDW: 19.3 % — ABNORMAL HIGH (ref 11.5–15.5)
WBC: 41 10*3/uL — ABNORMAL HIGH (ref 4.0–10.5)
nRBC: 0 % (ref 0.0–0.2)

## 2020-08-25 LAB — CK: Total CK: 429 U/L — ABNORMAL HIGH (ref 38–234)

## 2020-08-25 MED ORDER — METOPROLOL TARTRATE 5 MG/5ML IV SOLN: 5.0000 mg | Freq: Four times a day (QID) | INTRAVENOUS | Status: DC | PRN

## 2020-08-25 MED ORDER — HYDROMORPHONE HCL 1 MG/ML IJ SOLN
0.5000 mg | INTRAMUSCULAR | Status: DC | PRN
  Administered 2020-08-25 – 2020-08-26 (×4): 0.5 mg via INTRAVENOUS
  Filled 2020-08-25 (×4): qty 0.5

## 2020-08-25 MED ORDER — ACETAMINOPHEN 10 MG/ML IV SOLN
1000.0000 mg | Freq: Four times a day (QID) | INTRAVENOUS | Status: AC
  Administered 2020-08-25 – 2020-08-26 (×4): 1000 mg via INTRAVENOUS
  Filled 2020-08-25 (×4): qty 100

## 2020-08-25 NOTE — Consult Note (Signed)
Consultation Note Date: 08/25/2020   Patient Name: [name]  DOB: 18-Mar-1920  MRN: [id number]  Age / Sex: 86 y.o., female  PCP: Iona Beard, MD Referring Physician: Barb Merino, MD  Reason for Consultation: Establishing goals of care  HPI/Patient Profile: 86 y.o. female  with past medical history of Parkinson's dementia- bedbound and minimally verbal at baseline, CHF, HTN- recent admission 11/29 for sepsis bacteremia- admitted again on 08/24/2020 with severe sepsis. On admission she was hyperthermic with temp 106, hypoxic and hypotensive. She had vomited and appeared to aspirated at home- CXR without focal opacity. She has been started on empiric antibiotics and IV fluids. She has acute kidney injury that is improving with IV fluids. Palliative medicine consulted for goals of care.    Clinical Assessment and Goals of Care: Evaluated patient- she is in bed- does not follow commands, does not answer questions. She is shivering and jerking and respiratory status appears uncomfortable.  I am familiar with this patient and her family having met with them during her last admission.  I met with patient's two daughters and sister. Her daughter's were very reluctant to discuss end of life issues or the paths of comfort vs aggressive medical care.  They will accept any interventions that are offered by medical providers with the goal of stabilizing patient and returning home where she has 24 hour care.  On admission patient appeared to be actively dying. I am concerned that even if she is stabilized this admission with IV fluids and antibiotics she will quickly decline again once she is discharged home.  If medical team agrees that patient is dying despite all interventions- which appears to be the case- then recommend transitioning patient to comfort to allow for peaceful dying process and comfort as patient currently  appears to be uncomfortable and suffering at end of life.  Primary Decision Maker NEXT OF KIN- two daughters    SUMMARY OF RECOMMENDATIONS -Difficult situation- see above discussion- continue current scope of care    Code Status/Advance Care Planning:  DNR  Prognosis:    Unable to determine- likely limited to weeks even with current interventions, would likely be days if patient was full comfort  Discharge Planning: To Be Determined  Primary Diagnoses: Present on Admission: . Sepsis ([place]) . Parkinson disease ([place]) . Essential hypertension . Dementia with behavioral disturbance ([place]) . Depression with anxiety . Chronic diastolic CHF (congestive heart failure) ([place]) . Acute on chronic respiratory failure ([place]) . Acute metabolic encephalopathy . Hypernatremia . AF (paroxysmal atrial fibrillation) ([place])   I have reviewed the medical record, interviewed the patient and family, and examined the patient. The following aspects are pertinent.  Past Medical History:  Diagnosis Date  . Alzheimer's dementia ([place])   . Cancer (HCC)    breast  . CHF (congestive heart failure) ([place])   . Heart attack ([place])   . Hypertension   . Parkinson disease [facility])    Social History   Socioeconomic History  . Marital status: Widowed    Spouse  name: Not on file  . Number of children: Not on file  . Years of education: Not on file  . Highest education level: Not on file  Occupational History  . Occupation: Retired and disabled  Tobacco Use  . Smoking status: Never Smoker  . Smokeless tobacco: Never Used  Vaping Use  . Vaping Use: Never used  Substance and Sexual Activity  . Alcohol use: No  . Drug use: No  . Sexual activity: Never  Other Topics Concern  . Not on file  Social History Narrative   Pt is totally dependent upon her daughter [name] who is very dedicated. Pt has a CAP worker 5 days a week for 8 hours a day. [name] drives and is able to take her mother to  appts and get her medications.   Social Determinants of Health   Financial Resource Strain:   . Difficulty of Paying Living Expenses: Not on file  Food Insecurity:   . Worried About Charity fundraiser in the Last Year: Not on file  . Ran Out of Food in the Last Year: Not on file  Transportation Needs:   . Lack of Transportation (Medical): Not on file  . Lack of Transportation (Non-Medical): Not on file  Physical Activity:   . Days of Exercise per Week: Not on file  . Minutes of Exercise per Session: Not on file  Stress:   . Feeling of Stress : Not on file  Social Connections:   . Frequency of Communication with Friends and Family: Not on file  . Frequency of Social Gatherings with Friends and Family: Not on file  . Attends Religious Services: Not on file  . Active Member of Clubs or Organizations: Not on file  . Attends Archivist Meetings: Not on file  . Marital Status: Not on file   Scheduled Meds: . Chlorhexidine Gluconate Cloth  6 each Topical Daily  . enoxaparin (LOVENOX) injection  30 mg Subcutaneous Q24H   Continuous Infusions: . sodium chloride 100 mL/hr at 08/25/20 0735  . acetaminophen 1,000 mg (08/25/20 1243)  . [START ON 08/26/2020] levofloxacin (LEVAQUIN) IV    . [START ON 08/26/2020] vancomycin     PRN Meds:.acetaminophen, albuterol, HYDROmorphone (DILAUDID) injection, LORazepam, metoprolol tartrate, ondansetron **OR** ondansetron (ZOFRAN) IV Medications Prior to Admission:  Prior to Admission medications   Medication Sig Start Date End Date Taking? Authorizing Provider  acetaminophen (TYLENOL) 500 MG tablet Take 500 mg by mouth at bedtime. For pain   Yes [provider]  amiodarone (PACERONE) 200 MG tablet Take 1 tablet (200 mg total) by mouth daily. 08/20/20 09/19/20 Yes Shah, Pratik D, DO  aspirin EC 81 MG tablet Take 1 tablet (81 mg total) by mouth daily. 04/05/13  Yes Kathie Dike, MD  benztropine (COGENTIN) 1 MG tablet Take 1 tablet (1 mg  total) by mouth daily. 04/05/13  Yes Kathie Dike, MD  citalopram (CELEXA) 20 MG tablet Take 1 tablet (20 mg total) by mouth every morning. 04/05/13  Yes Kathie Dike, MD  ferrous sulfate 325 (65 FE) MG EC tablet Take 1 tablet (325 mg total) by mouth daily. 03/20/19 08/25/20 Yes Kathie Dike, MD  glycopyrrolate (ROBINUL) 1 MG tablet Take 1 tablet (1 mg total) by mouth 3 (three) times daily. 08/19/20 09/18/20 Yes Shah, Pratik D, DO  ipratropium-albuterol (DUONEB) 0.5-2.5 (3) MG/3ML SOLN Take 3 mLs by nebulization 4 (four) times daily. 09/28/18  Yes Barton Dubois, MD  metoprolol (LOPRESSOR) 50 MG tablet Take 0.5  tablets (25 mg total) by mouth 2 (two) times daily. 09/24/13  Yes Kathie Dike, MD  pantoprazole (PROTONIX) 40 MG tablet Take 1 tablet (40 mg total) by mouth daily. 03/20/19 08/25/20 Yes Kathie Dike, MD  rivastigmine (EXELON) 9.5 mg/24hr Place 1 patch (9.5 mg total) onto the skin daily. *Remove and discard used patches* 04/05/13  Yes Memon, Jolaine Artist, MD  zolpidem (AMBIEN) 10 MG tablet Take 0.5 tablets (5 mg total) by mouth at bedtime as needed for sleep. 03/20/19  Yes Kathie Dike, MD   Allergies  Allergen Reactions  . Adhesive [Tape] Rash  . Ensure Pudding [Nutritional Supplements] Swelling and Rash    Throat swelling  . Penicillins Swelling and Rash    Has patient had a PCN reaction causing immediate rash, facial/tongue/throat swelling, SOB or lightheadedness with hypotension: Yes Has patient had a PCN reaction causing severe rash involving mucus membranes or skin necrosis: Yes Has patient had a PCN reaction that required hospitalization No Has patient had a PCN reaction occurring within the last 10 years: No If all of the above answers are "NO", then may proceed with Cephalosporin use.   Review of Systems  Unable to perform ROS: Dementia    Physical Exam Vitals and nursing note reviewed.  Constitutional:      Appearance: She is ill-appearing and toxic-appearing.  Skin:     General: Skin is warm and dry.     Coloration: Skin is pale.  Neurological:     Comments: nonresponsive     Vital Signs: BP 110/82   Pulse 87   Temp 100 F (37.8 C) (Rectal)   Resp (!) 46   Ht _0  (1.549 m)   Wt 71.4 kg   SpO2 100%   BMI 29.74 kg/m  Pain Scale: PAINAD       SpO2: SpO2: 100 % O2 Device:SpO2: 100 % O2 Flow Rate: .O2 Flow Rate (L/min): 10 L/min  IO: Intake/output summary:   Intake/Output Summary (Last 24 hours) at 08/25/2020 1832 Last data filed at 08/25/2020 1819 Gross per 24 hour  Intake --  Output 675 ml  Net -675 ml    LBM:   Baseline Weight: Weight: 71.4 kg Most recent weight: Weight: 71.4 kg     Palliative Assessment/Data: PPS: 10%     Thank you for this consult. Palliative medicine will continue to follow and assist as needed.   Time In: 1436 Time Out: 1529 Time Total: 53 mins  Greater than 50%  of this time was spent counseling and coordinating care related to the above assessment and plan.  Signed by: Mariana Kaufman, AGNP-C Palliative Medicine    Please contact Palliative Medicine Team phone at [phone] for questions and concerns.  For individual provider: See Shea Evans

## 2020-08-25 NOTE — Progress Notes (Signed)
Brief note- full consult note to follow-   Met with patient's two daughters and sister- they wish for continued current level of care with limit set only at DNR.  Family is willing to accept any interventions that are offered by medical team due to their strong beliefs that patient will die when she is ready despite any interventions offered by medical team. They will likely not make decision for comfort measures only on their own while other interventions are presented as an option.  If medical team agrees that patient is dying despite all interventions- which appears to be the case- then recommend transitioning patient to comfort to allow for peaceful dying process and comfort as patient currently appears to be uncomfortable and suffering at end of life.   Mariana Kaufman, AGNP-C Palliative Medicine  Please call Palliative Medicine team phone with any questions [phone]. For individual providers please see [facility].

## 2020-08-25 NOTE — Progress Notes (Signed)
PROGRESS NOTE    [name]  NUU:[id number] DOB: 04/02/1920 DOA: 08/24/2020 PCP: Iona Beard, MD    Brief Narrative:  86 year old female with history of advanced dementia and bedbound for last 10 years, diastolic heart failure, Parkinson's, hypertension, COPD on 2 L home oxygen brought from home with respiratory distress.  Patient was recently admitted to the hospital and discharged 5 days ago where she was treated for probable aspiration pneumonitis.  She also developed rapid A. fib and treated with amiodarone.  No anticoagulation.  Family noticed progressive worsening cough at home, poor mentation and rattling noise so they brought her to the emergency room. In the emergency room temperature 106, heart rate 111, respiratory 39.  Lactic acid 3.3.  Urinalysis with leukocytes.  Chest x-ray without focal opacity.  Sepsis pathway was started and patient was treated with IV fluids and antibiotics after urine and blood cultures. Patient remains in very poor clinical condition with slim chances of recovery, admitted to ICU.   Assessment & Plan:   Principal Problem:   Sepsis ([place]) Active Problems:   Dementia with behavioral disturbance ([place])   Parkinson disease ([place])   Essential hypertension   Depression with anxiety   Acute metabolic encephalopathy   Chronic diastolic CHF (congestive heart failure) (HCC)   Acute on chronic respiratory failure (HCC)   Hypernatremia   AKI (acute kidney injury) (HCC)   AF (paroxysmal atrial fibrillation) (HCC)   COPD (chronic obstructive pulmonary disease) (HCC)  Severe sepsis likely due to aspiration pneumonia: Blood cultures urine cultures pending. Patient on Levaquin and vancomycin, penicillin allergy Maintenance IV fluids.  N.p.o. patient is not able to eat. Poor prognosis, palliative discussion in place.  Acute kidney injury: Due to sepsis.  We will continue on maintenance IV fluid.  Altered mental status, acute metabolic infective encephalopathy  in the setting of underlying advanced dementia: Patient remains obtunded, looks very uncomfortable with gurgling noise and swallow breathing. We will continue to monitor. She is not stable enough to go for further scans.  Paroxysmal A. fib with RVR: Currently on amiodarone infusion.  Holding metoprolol.  Not an anticoagulation candidate.  Social/ethics/goal of care: See previous discussion.  Multiple family members at the bedside.  Patient's 2 daughters and sister at the bedside.  Discussed with the family that she is nearing death.  She has worsening leukocytosis and oxygen requirement as well as tachycardia.  Patient looks very uncomfortable. Patient with advanced dementia and dysphagia, chances of recovery are slim. Offered comfort care medications including low-dose opiates and benzodiazepine and also offered family to start comfort care measures and hospice, however they are adamantly refusing at this time. Patient will probably die in the hospital, hopefully family will understand the gravity of situation and keep her comfortable. Patient is DNR.  No code, no intubation. I offered low-dose Ativan and IV Tylenol, family agreed and will use it.   DVT prophylaxis: enoxaparin (LOVENOX) injection 30 mg Start: 08/24/20 1115   Code Status: DNR Family Communication: 2 daughters and sister at the bedside Disposition Plan: Status is: Inpatient  Remains inpatient appropriate because:Hemodynamically unstable   Dispo: The patient is from: Home              Anticipated d/c is to: Unknown.              Anticipated d/c date is: Unknown.              Patient currently is not medically stable to d/c.  Consultants:   Palliative medicine  Procedures:   None  Antimicrobials:  Antibiotics Given (last 72 hours)    Date/Time Action Medication Dose Rate   08/24/20 0534 New Bag/Given   vancomycin (VANCOCIN) IVPB 1000 mg/200 mL premix 1,000 mg 200 mL/hr   08/24/20 0535 New  Bag/Given   levofloxacin (LEVAQUIN) IVPB 750 mg 750 mg 100 mL/hr         Subjective: Patient seen and examined.  Overnight remained with tachypnea and tachycardia.  She is totally obtunded and unable to interact.  Very sick looking.  WBC count is 41,000.  Objective: Vitals:   08/24/20 2100 08/24/20 2200 08/25/20 0757 08/25/20 1114  BP: (!) 107/59 110/82    Pulse: (!) 41 61 82 (!) 38  Resp: (!) 36 (!) 36 (!) 32 (!) 37  Temp:   (!) 97.5 F (36.4 C) 99.7 F (37.6 C)  TempSrc:   Rectal Rectal  SpO2: 100% 100% 97% 98%  Weight:      Height:        Intake/Output Summary (Last 24 hours) at 08/25/2020 1329 Last data filed at 08/24/2020 1919 Gross per 24 hour  Intake 69.66 ml  Output 225 ml  Net -155.34 ml   Filed Weights   08/24/20 1245  Weight: 71.4 kg    Examination:  General exam: Critically sick, obtunded, unable to interact Audible gurgling noise and swallow breathing.  Looks very uncomfortable and distressed. Respiratory system: Poor bilateral air entry.  Conducted upper airway sounds. Cardiovascular system: S1 & S2 heard, irregularly irregular, tachycardic. Gastrointestinal system: Distended but soft.  Bowel sounds present. Central nervous system: Sleepy, lethargic, obtunded. Extremities: Not following any commands. Anasarca present.     Data Reviewed: I have personally reviewed following labs and imaging studies  CBC: Recent Labs  Lab 08/19/20 0337 08/24/20 0447 08/25/20 0426  WBC 10.8* 34.4* 41.0*  NEUTROABS  --  30.5*  --   HGB 10.8* 12.3 10.9*  HCT 36.8 41.0 35.7*  MCV 76.3* 75.8* 73.9*  PLT 234 345 921   Basic Metabolic Panel: Recent Labs  Lab 08/19/20 0337 08/24/20 0447 08/25/20 0652  NA 143 151* 149*  K 4.2 3.6 3.3*  CL 109 116* 119*  CO2 25 24 21*  GLUCOSE 183* 195* 150*  BUN 38* 37* 42*  CREATININE 1.09* 1.50* 1.13*  CALCIUM 8.5* 8.4* 7.5*  MG 2.4  --   --    GFR: Estimated Creatinine Clearance: 25.1 mL/min (A) (by C-G formula  based on SCr of 1.13 mg/dL (H)). Liver Function Tests: Recent Labs  Lab 08/24/20 0447 08/25/20 0652  AST 23 26  ALT 24 23  ALKPHOS 51 43  BILITOT 1.0 0.5  PROT 7.3 6.2*  ALBUMIN 3.3* 2.7*   No results for input(s): LIPASE, AMYLASE in the last 168 hours. No results for input(s): AMMONIA in the last 168 hours. Coagulation Profile: Recent Labs  Lab 08/24/20 0447  INR 1.3*   Cardiac Enzymes: Recent Labs  Lab 08/25/20 0652  CKTOTAL 429*   BNP (last 3 results) No results for input(s): PROBNP in the last 8760 hours. HbA1C: No results for input(s): HGBA1C in the last 72 hours. CBG: No results for input(s): GLUCAP in the last 168 hours. Lipid Profile: No results for input(s): CHOL, HDL, LDLCALC, TRIG, CHOLHDL, LDLDIRECT in the last 72 hours. Thyroid Function Tests: Recent Labs    08/24/20 0447  TSH 6.061*  FREET4 0.51*   Anemia Panel: No results for input(s): VITAMINB12, FOLATE, FERRITIN, TIBC, IRON, RETICCTPCT  in the last 72 hours. Sepsis Labs: Recent Labs  Lab 08/24/20 0447  LATICACIDVEN 3.3*    Recent Results (from the past 240 hour(s))  Blood Culture (routine x 2)     Status: None   Collection Time: 08/16/20  1:00 PM   Specimen: Right Antecubital; Blood  Result Value Ref Range Status   Specimen Description   Final    RIGHT ANTECUBITAL BOTTLES DRAWN AEROBIC AND ANAEROBIC   Special Requests Blood Culture adequate volume  Final   Culture   Final    NO GROWTH 5 DAYS Performed at [facility], [address], [place], [place] [address]    Report Status 08/21/2020 FINAL  Final  Blood Culture (routine x 2)     Status: Abnormal   Collection Time: 08/16/20  1:05 PM   Specimen: BLOOD RIGHT WRIST  Result Value Ref Range Status   Specimen Description   Final    BLOOD RIGHT WRIST BOTTLES DRAWN AEROBIC AND ANAEROBIC Performed at [facility], [address] [place], [place] [address]    Special Requests   Final    Blood Culture adequate volume Performed at [facility], [address] [place], [place] [address]    Culture  Setup Time   Final    GRAM POSITIVE COCCI IN BOTH AEROBIC AND ANAEROBIC BOTTLES Gram Stain Report Called to,Read Back By and Verified With: MICHAEL DOSS @0830  08/17/20 BY JONES,T [facility] Organism ID to follow    Culture (A)  Final    STAPHYLOCOCCUS HOMINIS THE SIGNIFICANCE OF ISOLATING THIS ORGANISM FROM A SINGLE SET OF BLOOD CULTURES WHEN MULTIPLE SETS ARE DRAWN IS UNCERTAIN. PLEASE NOTIFY THE MICROBIOLOGY DEPARTMENT WITHIN ONE WEEK IF SPECIATION AND SENSITIVITIES ARE REQUIRED. Performed at [facility], [place] [address] [place], [place] [address]    Report Status 08/19/2020 FINAL  Final  Blood Culture ID Panel (Reflexed)     Status: Abnormal   Collection Time: 08/16/20  1:05 PM  Result Value Ref Range Status   Enterococcus faecalis NOT DETECTED NOT DETECTED Final   Enterococcus Faecium NOT DETECTED NOT DETECTED Final   Listeria monocytogenes NOT DETECTED NOT DETECTED Final   Staphylococcus species DETECTED (A) NOT DETECTED Final    Comment: CRITICAL RESULT CALLED TO, READ BACK BY AND VERIFIED WITH: Gloris Manchester PharmD 14:30 08/17/20 ([email or web address])    Staphylococcus aureus (BCID) NOT DETECTED NOT DETECTED Final   Staphylococcus epidermidis NOT DETECTED NOT DETECTED Final   Staphylococcus lugdunensis NOT DETECTED NOT DETECTED Final   Streptococcus species NOT DETECTED NOT DETECTED Final   Streptococcus agalactiae NOT DETECTED NOT DETECTED Final   Streptococcus pneumoniae NOT DETECTED NOT DETECTED Final   Streptococcus pyogenes NOT DETECTED NOT DETECTED Final   A.calcoaceticus-baumannii NOT DETECTED NOT DETECTED Final   Bacteroides fragilis NOT DETECTED NOT DETECTED Final   Enterobacterales NOT DETECTED NOT DETECTED Final   Enterobacter cloacae complex NOT DETECTED NOT DETECTED Final   Escherichia coli NOT DETECTED NOT DETECTED Final   Klebsiella aerogenes NOT DETECTED NOT DETECTED Final   Klebsiella oxytoca NOT DETECTED NOT  DETECTED Final   Klebsiella pneumoniae NOT DETECTED NOT DETECTED Final   Proteus species NOT DETECTED NOT DETECTED Final   Salmonella species NOT DETECTED NOT DETECTED Final   Serratia marcescens NOT DETECTED NOT DETECTED Final   Haemophilus influenzae NOT DETECTED NOT DETECTED Final   Neisseria meningitidis NOT DETECTED NOT DETECTED Final   Pseudomonas aeruginosa NOT DETECTED NOT DETECTED Final   Stenotrophomonas maltophilia NOT DETECTED NOT DETECTED Final  Candida albicans NOT DETECTED NOT DETECTED Final   Candida auris NOT DETECTED NOT DETECTED Final   Candida glabrata NOT DETECTED NOT DETECTED Final   Candida krusei NOT DETECTED NOT DETECTED Final   Candida parapsilosis NOT DETECTED NOT DETECTED Final   Candida tropicalis NOT DETECTED NOT DETECTED Final   Cryptococcus neoformans/gattii NOT DETECTED NOT DETECTED Final    Comment: Performed at [facility], [place] [address], [place], [place] [address]  Urine culture     Status: Abnormal   Collection Time: 08/16/20  2:49 PM   Specimen: Urine, Catheterized  Result Value Ref Range Status   Specimen Description   Final    URINE, CATHETERIZED Performed at [facility], [address] [place], [place] [address]    Special Requests   Final    NONE Performed at [facility], [address] [place], [place] [address]    Culture (A)  Final    <10,000 COLONIES/mL INSIGNIFICANT GROWTH Performed at [facility] [address] [place],  [address]    Report Status 08/18/2020 FINAL  Final  Resp Panel by RT-PCR (Flu A&B, Covid) Nasopharyngeal Swab     Status: None   Collection Time: 08/16/20  3:17 PM   Specimen: Nasopharyngeal Swab; Nasopharyngeal(NP) swabs in vial transport medium  Result Value Ref Range Status   SARS Coronavirus 2 by RT PCR NEGATIVE NEGATIVE Final    Comment: (NOTE) SARS-CoV-2 target nucleic acids are NOT DETECTED.  The SARS-CoV-2 RNA is generally detectable in upper respiratory specimens during the  acute phase of infection. The lowest concentration of SARS-CoV-2 viral copies this assay can detect is 138 copies/mL. A negative result does not preclude SARS-Cov-2 infection and should not be used as the sole basis for treatment or other patient management decisions. A negative result may occur with  improper specimen collection/handling, submission of specimen other than nasopharyngeal swab, presence of viral mutation(s) within the areas targeted by this assay, and inadequate number of viral copies(<138 copies/mL). A negative result must be combined with clinical observations, patient history, and epidemiological information. The expected result is Negative.  Fact Sheet for Patients:  EntrepreneurPulse.com.au  Fact Sheet for Healthcare Providers:  IncredibleEmployment.be  This test is no t yet approved or cleared by the [place] FDA and  has been authorized for detection and/or diagnosis of SARS-CoV-2 by FDA under an Emergency Use Authorization (EUA). This EUA will remain  in effect (meaning this test can be used) for the duration of the COVID-19 declaration under Section 564(b)(1) of the Act, 21 U.S.C.section 360bbb-3(b)(1), unless the authorization is terminated  or revoked sooner.       Influenza A by PCR NEGATIVE NEGATIVE Final   Influenza B by PCR NEGATIVE NEGATIVE Final    Comment: (NOTE) The Xpert Xpress SARS-CoV-2/FLU/RSV plus assay is intended as an aid in the diagnosis of influenza from Nasopharyngeal swab specimens and should not be used as a sole basis for treatment. Nasal washings and aspirates are unacceptable for Xpert Xpress SARS-CoV-2/FLU/RSV testing.  Fact Sheet for Patients: EntrepreneurPulse.com.au  Fact Sheet for Healthcare Providers: IncredibleEmployment.be  This test is not yet approved or cleared by the [place] FDA and has been authorized for detection and/or  diagnosis of SARS-CoV-2 by FDA under an Emergency Use Authorization (EUA). This EUA will remain in effect (meaning this test can be used) for the duration of the COVID-19 declaration under Section 564(b)(1) of the Act, 21 U.S.C. section 360bbb-3(b)(1), unless the authorization is terminated or revoked.  Performed at [facility], [address] [place], [place] [address]   Culture, blood (routine x 2)     Status: None   Collection Time: 08/18/20  9:04 AM   Specimen: BLOOD  Result Value Ref Range Status   Specimen Description BLOOD BLOOD RIGHT HAND  Final   Special Requests   Final    BOTTLES DRAWN AEROBIC AND ANAEROBIC Blood Culture adequate volume   Culture   Final    NO GROWTH 5 DAYS Performed at [facility], [address] [place], [place] [address]    Report Status 08/23/2020 FINAL  Final  Blood Culture (routine x 2)     Status: None (Preliminary result)   Collection Time: 08/24/20  4:47 AM   Specimen: BLOOD  Result Value Ref Range Status   Specimen Description BLOOD RIGHT ARM  Final   Special Requests   Final    BOTTLES DRAWN AEROBIC AND ANAEROBIC Blood Culture adequate volume Performed at [facility], [address] [place], [place] [address]    Culture PENDING  Incomplete   Report Status PENDING  Incomplete  Resp Panel by RT-PCR (Flu A&B, Covid) Nasopharyngeal Swab     Status: None   Collection Time: 08/24/20  5:29 AM   Specimen: Nasopharyngeal Swab; Nasopharyngeal(NP) swabs in vial transport medium  Result Value Ref Range Status   SARS Coronavirus 2 by RT PCR NEGATIVE NEGATIVE Final    Comment: (NOTE) SARS-CoV-2 target nucleic acids are NOT DETECTED.  The SARS-CoV-2 RNA is generally detectable in upper respiratory specimens during the acute phase of infection. The lowest concentration of SARS-CoV-2 viral copies this assay can detect is 138 copies/mL. A negative result does not preclude SARS-Cov-2 infection and should not be used as the sole basis for treatment  or other patient management decisions. A negative result may occur with  improper specimen collection/handling, submission of specimen other than nasopharyngeal swab, presence of viral mutation(s) within the areas targeted by this assay, and inadequate number of viral copies(<138 copies/mL). A negative result must be combined with clinical observations, patient history, and epidemiological information. The expected result is Negative.  Fact Sheet for Patients:  EntrepreneurPulse.com.au  Fact Sheet for Healthcare Providers:  IncredibleEmployment.be  This test is no t yet approved or cleared by the [place] FDA and  has been authorized for detection and/or diagnosis of SARS-CoV-2 by FDA under an Emergency Use Authorization (EUA). This EUA will remain  in effect (meaning this test can be used) for the duration of the COVID-19 declaration under Section 564(b)(1) of the Act, 21 U.S.C.section 360bbb-3(b)(1), unless the authorization is terminated  or revoked sooner.       Influenza A by PCR NEGATIVE NEGATIVE Final   Influenza B by PCR NEGATIVE NEGATIVE Final    Comment: (NOTE) The Xpert Xpress SARS-CoV-2/FLU/RSV plus assay is intended as an aid in the diagnosis of influenza from Nasopharyngeal swab specimens and should not be used as a sole basis for treatment. Nasal washings and aspirates are unacceptable for Xpert Xpress SARS-CoV-2/FLU/RSV testing.  Fact Sheet for Patients: EntrepreneurPulse.com.au  Fact Sheet for Healthcare Providers: IncredibleEmployment.be  This test is not yet approved or cleared by the [place] FDA and has been authorized for detection and/or diagnosis of SARS-CoV-2 by FDA under an Emergency Use Authorization (EUA). This EUA will remain in effect (meaning this test can be used) for the duration of the COVID-19 declaration under Section 564(b)(1) of the Act, 21 U.S.C. section  360bbb-3(b)(1), unless the authorization is terminated or  revoked.  Performed at [facility], [address], [place], [place] [address]          Radiology Studies: CT ABDOMEN PELVIS WO CONTRAST  Result Date: 08/24/2020 CLINICAL DATA:  Abdominal pain, coughing up brown stuff, trouble breathing, severe dementia, unable to provide additional history; past history of breast cancer, CHF, hypertension, Alzheimer's, Parkinson's, COPD EXAM: CT ABDOMEN AND PELVIS WITHOUT CONTRAST TECHNIQUE: Multidetector CT imaging of the abdomen and pelvis was performed following the standard protocol without IV contrast. Sagittal and coronal MPR images reconstructed from axial data set. IV contrast was administered but the bulk of the contrast dosed extravasated in the RIGHT upper extremity; routine post extravasation orders provided. No oral contrast. Beam hardening artifacts in upper abdomen due to patient's arms and extravasated contrast material. COMPARISON:  08/16/2020 FINDINGS: Lower chest: Bibasilar infiltrates and atelectasis. Enlargement of cardiac chambers. Coronary arterial and aortic calcifications. Hepatobiliary: Numerous hepatic cysts, largest 9.5 x 7.4 cm image 24. No other gross hepatic or gallbladder abnormality, though significant artifacts are present. Pancreas: Normal appearance Spleen: Grossly normal appearance Adrenals/Urinary Tract: Adrenal thickening without mass. Cyst at upper pole LEFT kidney unchanged. No additional renal mass or hydronephrosis. Bladder decompressed. Stomach/Bowel: Increased stool and gas within rectum. Prior ileocolic resection. Lipoma at distal descending colon 2.4 x 1.6 cm image 61. Stomach and remaining bowel loops unremarkable. Vascular/Lymphatic: Extensive atherosclerotic calcifications aorta and iliac arteries without aneurysm. No adenopathy. Reproductive: Uterus surgically absent with nonvisualization of ovaries Other: No free air or free fluid. Small RIGHT inguinal hernia  containing fat. Musculoskeletal: Osseous demineralization. Cannulated screws at proximal RIGHT femur. Old posttraumatic deformity of LEFT pubis. Chronic compression fracture of L1. Extravasated contrast material at RIGHT arm. IMPRESSION: Increased stool and gas within rectum. Hepatic and LEFT renal cysts. Small RIGHT inguinal hernia containing fat. Bibasilar pulmonary infiltrates and atelectasis. No acute intra-abdominal or intrapelvic abnormalities. Aortic Atherosclerosis (ICD10-I70.0). Electronically Signed   By: Lavonia Dana M.D.   On: 08/24/2020 13:38   DG Chest Port 1 View  Result Date: 08/24/2020 CLINICAL DATA:  Questionable sepsis EXAM: PORTABLE CHEST 1 VIEW COMPARISON:  Is 08/17/2020 FINDINGS: Cardiomegaly and aortic tortuosity. No aortic aneurysm by recent CT. Stable accentuated markings with scar-like appearance and atelectasis by recent CT. There is no edema, consolidation, effusion, or pneumothorax. Artifact from EKG leads. IMPRESSION: Stable from prior.  No acute finding. Electronically Signed   By: Monte Fantasia M.D.   On: 08/24/2020 05:43        Scheduled Meds: . Chlorhexidine Gluconate Cloth  6 each Topical Daily  . enoxaparin (LOVENOX) injection  30 mg Subcutaneous Q24H   Continuous Infusions: . sodium chloride 100 mL/hr at 08/25/20 0735  . acetaminophen 1,000 mg (08/25/20 1243)  . amiodarone 30 mg/hr (08/25/20 0734)  . [START ON 08/26/2020] levofloxacin (LEVAQUIN) IV    . [START ON 08/26/2020] vancomycin       LOS: 1 day    Time spent: 50 minutes    Barb Merino, MD [facility] Pager [phone]

## 2020-08-25 NOTE — Progress Notes (Signed)
Patient revisited . Family just left after meeting with palliative care and spending all day here. Called and discussed with daughter Shauna Hugh, they want to continue current level of care and re visit tomorrow morning.  Tylenol iv and ativan to use .

## 2020-08-25 NOTE — Progress Notes (Signed)
Chaplain visited w/ pt and daughter who was at bedside.  Pt was not alert so Chaplain established relationship of care and concern with daughter, Shauna Hugh.  Chaplain prayed aloud at bedside with pt and daughter. Chaplain advised daughter to have nurse page Spiritual Care if a need arose. Daughter asked Chaplain to stop by again. Chaplain will make a return visit.  [place]

## 2020-08-25 NOTE — Progress Notes (Signed)
Detailed note to follow. This patient is very very sick, she is actively dying.  I met with patient's 2 daughters, one of the daughter takes care of her at home.  Her 1 of sister was also at the bedside.  We discussed about very serious situation, probable end-of-life. One of her daughter did not agree with comfort care and does not want to talk about keeping their mom comfortable at end-of-life. Patient is probably not going to survive.  And I have explained this to the patient family. We agreed on giving IV Tylenol for comfort, will avoid morphine as per family.  She can also use a small dose of Ativan. Meantime we will continue IV fluids, IV antibiotics, however we have mostly reached to the futile level of care. She is DNR. Discussed with palliative care team, hopefully family can come to a comfort care approach.

## 2020-08-26 LAB — CBC WITH DIFFERENTIAL/PLATELET
Abs Immature Granulocytes: 0.54 10*3/uL — ABNORMAL HIGH (ref 0.00–0.07)
Basophils Absolute: 0.1 10*3/uL (ref 0.0–0.1)
Basophils Relative: 0 %
Eosinophils Absolute: 0.1 10*3/uL (ref 0.0–0.5)
Eosinophils Relative: 0 %
HCT: 31.6 % — ABNORMAL LOW (ref 36.0–46.0)
Hemoglobin: 9.1 g/dL — ABNORMAL LOW (ref 12.0–15.0)
Immature Granulocytes: 2 %
Lymphocytes Relative: 3 %
Lymphs Abs: 0.8 10*3/uL (ref 0.7–4.0)
MCH: 22.4 pg — ABNORMAL LOW (ref 26.0–34.0)
MCHC: 28.8 g/dL — ABNORMAL LOW (ref 30.0–36.0)
MCV: 77.8 fL — ABNORMAL LOW (ref 80.0–100.0)
Monocytes Absolute: 1.3 10*3/uL — ABNORMAL HIGH (ref 0.1–1.0)
Monocytes Relative: 5 %
Neutro Abs: 25.3 10*3/uL — ABNORMAL HIGH (ref 1.7–7.7)
Neutrophils Relative %: 90 %
Platelets: 227 10*3/uL (ref 150–400)
RBC: 4.06 MIL/uL (ref 3.87–5.11)
RDW: 19.9 % — ABNORMAL HIGH (ref 11.5–15.5)
WBC: 28 10*3/uL — ABNORMAL HIGH (ref 4.0–10.5)
nRBC: 0.1 % (ref 0.0–0.2)

## 2020-08-26 LAB — COMPREHENSIVE METABOLIC PANEL
ALT: 21 U/L (ref 0–44)
AST: 20 U/L (ref 15–41)
Albumin: 2.6 g/dL — ABNORMAL LOW (ref 3.5–5.0)
Alkaline Phosphatase: 45 U/L (ref 38–126)
Anion gap: 8 (ref 5–15)
BUN: 38 mg/dL — ABNORMAL HIGH (ref 8–23)
CO2: 23 mmol/L (ref 22–32)
Calcium: 7.9 mg/dL — ABNORMAL LOW (ref 8.9–10.3)
Chloride: 121 mmol/L — ABNORMAL HIGH (ref 98–111)
Creatinine, Ser: 1.14 mg/dL — ABNORMAL HIGH (ref 0.44–1.00)
GFR, Estimated: 44 mL/min — ABNORMAL LOW (ref >60)
Glucose, Bld: 116 mg/dL — ABNORMAL HIGH (ref 70–99)
Potassium: 4.1 mmol/L (ref 3.5–5.1)
Sodium: 152 mmol/L — ABNORMAL HIGH (ref 135–145)
Total Bilirubin: 0.5 mg/dL (ref 0.3–1.2)
Total Protein: 6 g/dL — ABNORMAL LOW (ref 6.5–8.1)

## 2020-08-26 LAB — URINE CULTURE: Culture: 50000 — AB

## 2020-08-26 LAB — MAGNESIUM: Magnesium: 1.9 mg/dL (ref 1.7–2.4)

## 2020-08-26 LAB — PHOSPHORUS: Phosphorus: 2.4 mg/dL — ABNORMAL LOW (ref 2.5–4.6)

## 2020-08-26 MED ORDER — SODIUM CHLORIDE 0.45 % IV SOLN: INTRAVENOUS | Status: DC

## 2020-08-26 MED ORDER — FLUCONAZOLE 100MG IVPB
100.0000 mg | INTRAVENOUS | Status: DC
  Administered 2020-08-26: 100 mg via INTRAVENOUS
  Filled 2020-08-26 (×4): qty 50

## 2020-08-26 NOTE — Progress Notes (Signed)
PROGRESS NOTE    [name]  YIF:[id number] DOB: November 05, 1919 DOA: 08/24/2020 PCP: Iona Beard, MD    Brief Narrative:  86 year old female with history of advanced dementia at least severe for last 10 years, diastolic heart failure, Parkinson's, hypertension, COPD on 2 L home oxygen brought from home with respiratory distress.  Patient was recently admitted to the hospital and discharged 5 days ago where she was treated for probable aspiration pneumonitis.  She also developed rapid A. fib and treated with amiodarone.  No anticoagulation.  Family noticed progressive worsening cough at home, poor mentation and rattling noise so they brought her to the emergency room. In the emergency room temperature 106, heart rate 111, respiratory 39.  Lactic acid 3.3.  Urinalysis with leukocytes.  Chest x-ray without focal opacity.  Sepsis pathway was started and patient was treated with IV fluids and antibiotics after urine and blood cultures. Patient remains in very poor clinical condition with slim chances of recovery, admitted to ICU.   Assessment & Plan:   Principal Problem:   Sepsis ([place]) Active Problems:   Dementia with behavioral disturbance ([place])   Parkinson disease ([place])   Essential hypertension   Depression with anxiety   Acute metabolic encephalopathy   Chronic diastolic CHF (congestive heart failure) (HCC)   Acute on chronic respiratory failure (HCC)   Hypernatremia   AKI (acute kidney injury) (HCC)   AF (paroxysmal atrial fibrillation) (HCC)   COPD (chronic obstructive pulmonary disease) (HCC)  Severe sepsis likely due to aspiration pneumonia: Blood cultures negative so far. Urine culture with 50,000 yeast. Patient on Levaquin and vancomycin, penicillin allergy We will add Diflucan today. Maintenance IV fluids.  N.p.o. patient is not able to eat. Poor prognosis, palliative discussion in place.  Acute kidney injury: Hypernatremia: Due to sepsis.  We will continue on maintenance IV  fluid.  Changed to half-normal saline today.  Altered mental status, acute metabolic infective encephalopathy in the setting of underlying advanced dementia: Patient remains obtunded, looks very uncomfortable with gurgling noise and swallow breathing. We will continue to monitor.  Paroxysmal A. fib with RVR: Currently sinus rhythm.  Temperature controlled today.  Metoprolol as needed.  Not an anticoagulation candidate.    Social/ethics/goal of care: Discussion with family, multiple family members on 12/8 decided to continue to treat.   Met with patient's daughter Shauna Hugh at bedside 12/9, we discussed about patient's poor prognosis and poor chances of recovery and rehab as well as unable to eat and suffering. Patient's 2 daughters and 1 son to come to hospital tomorrow to discuss about further plan of care for patient. Patient is DNR.  No code, no intubation. Using low-dose Ativan and Dilaudid for air hunger and anxiety.   DVT prophylaxis: enoxaparin (LOVENOX) injection 30 mg Start: 08/24/20 1115   Code Status: DNR Family Communication: Patient's daughter [name] at the bedside. Disposition Plan: Status is: Inpatient  Remains inpatient appropriate because:Hemodynamically unstable   Dispo: The patient is from: Home              Anticipated d/c is to: Unknown.              Anticipated d/c date is: Unknown.              Patient currently is not medically stable to d/c.   Consultants:   Palliative medicine  Procedures:   None  Antimicrobials:  Antibiotics Given (last 72 hours)    Date/Time Action Medication Dose Rate   08/24/20 0534 New Bag/Given  vancomycin (VANCOCIN) IVPB 1000 mg/200 mL premix 1,000 mg 200 mL/hr   08/24/20 0535 New Bag/Given   levofloxacin (LEVAQUIN) IVPB 750 mg 750 mg 100 mL/hr   08/26/20 0656 New Bag/Given   vancomycin (VANCOREADY) IVPB 750 mg/150 mL 750 mg 150 mL/hr   08/26/20 0811 New Bag/Given   levofloxacin (LEVAQUIN) IVPB 500 mg 500 mg 100 mL/hr          Subjective: Patient seen and examined.  Overnight afebrile.  Blood pressures are fairly stable.  Remains on nonrebreather.  She is with labored breathing and tachypnea.  Does not interact.  Withdraws to stimuli.  Objective: Vitals:   08/26/20 0500 08/26/20 0600 08/26/20 0739 08/26/20 1133  BP: 122/72 121/78    Pulse: 74 75 70 72  Resp: (!) 34 (!) 24 (!) 32 (!) 32  Temp:   98.7 F (37.1 C) 98.5 F (36.9 C)  TempSrc:   Rectal Oral  SpO2: 100% 100% 100% 100%  Weight:      Height:        Intake/Output Summary (Last 24 hours) at 08/26/2020 1328 Last data filed at 08/26/2020 0658 Gross per 24 hour  Intake 4435.5 ml  Output 900 ml  Net 3535.5 ml   Filed Weights   08/24/20 1245  Weight: 71.4 kg    Examination:  General exam: Critically sick, obtunded, looks uncomfortable. Audible upper airway sounds. Respiratory system: Poor bilateral air entry.  Conducted upper airway sounds. Cardiovascular system: S1 & S2 heard, irregularly irregular, tachycardic. Gastrointestinal system: Distended but soft.  Bowel sounds present.  Urine collection bag with clear urine. Central nervous system: Sleepy, lethargic, obtunded. Extremities: Not following any commands.  Withdraws to stimuli. Anasarca present all extremities.    Data Reviewed: I have personally reviewed following labs and imaging studies  CBC: Recent Labs  Lab 08/24/20 0447 08/25/20 0426 08/26/20 0430  WBC 34.4* 41.0* 28.0*  NEUTROABS 30.5*  --  25.3*  HGB 12.3 10.9* 9.1*  HCT 41.0 35.7* 31.6*  MCV 75.8* 73.9* 77.8*  PLT 345 201 270   Basic Metabolic Panel: Recent Labs  Lab 08/24/20 0447 08/25/20 0652 08/26/20 0430  NA 151* 149* 152*  K 3.6 3.3* 4.1  CL 116* 119* 121*  CO2 24 21* 23  GLUCOSE 195* 150* 116*  BUN 37* 42* 38*  CREATININE 1.50* 1.13* 1.14*  CALCIUM 8.4* 7.5* 7.9*  MG  --   --  1.9  PHOS  --   --  2.4*   GFR: Estimated Creatinine Clearance: 24.9 mL/min (A) (by C-G formula based on SCr  of 1.14 mg/dL (H)). Liver Function Tests: Recent Labs  Lab 08/24/20 0447 08/25/20 0652 08/26/20 0430  AST _0 ALT _1 ALKPHOS 51 43 45  BILITOT 1.0 0.5 0.5  PROT 7.3 6.2* 6.0*  ALBUMIN 3.3* 2.7* 2.6*   No results for input(s): LIPASE, AMYLASE in the last 168 hours. No results for input(s): AMMONIA in the last 168 hours. Coagulation Profile: Recent Labs  Lab 08/24/20 0447  INR 1.3*   Cardiac Enzymes: Recent Labs  Lab 08/25/20 0652  CKTOTAL 429*   BNP (last 3 results) No results for input(s): PROBNP in the last 8760 hours. HbA1C: No results for input(s): HGBA1C in the last 72 hours. CBG: No results for input(s): GLUCAP in the last 168 hours. Lipid Profile: No results for input(s): CHOL, HDL, LDLCALC, TRIG, CHOLHDL, LDLDIRECT in the last 72 hours. Thyroid Function Tests: Recent Labs    08/24/20 0447  TSH 6.061*  FREET4 0.51*   Anemia Panel: No results for input(s): VITAMINB12, FOLATE, FERRITIN, TIBC, IRON, RETICCTPCT in the last 72 hours. Sepsis Labs: Recent Labs  Lab 08/24/20 0447  LATICACIDVEN 3.3*    Recent Results (from the past 240 hour(s))  Urine culture     Status: Abnormal   Collection Time: 08/16/20  2:49 PM   Specimen: Urine, Catheterized  Result Value Ref Range Status   Specimen Description   Final    URINE, CATHETERIZED Performed at [facility], [address], [place], [place] [address]    Special Requests   Final    NONE Performed at [facility], [address], [place], [place] [address]    Culture (A)  Final    <10,000 COLONIES/mL INSIGNIFICANT GROWTH Performed at [facility] [address], [place], [place] [address]    Report Status 08/18/2020 FINAL  Final  Resp Panel by RT-PCR (Flu A&B, Covid) Nasopharyngeal Swab     Status: None   Collection Time: 08/16/20  3:17 PM   Specimen: Nasopharyngeal Swab; Nasopharyngeal(NP) swabs in vial transport medium  Result Value Ref Range Status   SARS Coronavirus 2 by RT PCR  NEGATIVE NEGATIVE Final    Comment: (NOTE) SARS-CoV-2 target nucleic acids are NOT DETECTED.  The SARS-CoV-2 RNA is generally detectable in upper respiratory specimens during the acute phase of infection. The lowest concentration of SARS-CoV-2 viral copies this assay can detect is 138 copies/mL. A negative result does not preclude SARS-Cov-2 infection and should not be used as the sole basis for treatment or other patient management decisions. A negative result may occur with  improper specimen collection/handling, submission of specimen other than nasopharyngeal swab, presence of viral mutation(s) within the areas targeted by this assay, and inadequate number of viral copies(<138 copies/mL). A negative result must be combined with clinical observations, patient history, and epidemiological information. The expected result is Negative.  Fact Sheet for Patients:  EntrepreneurPulse.com.au  Fact Sheet for Healthcare Providers:  IncredibleEmployment.be  This test is no t yet approved or cleared by the [place] FDA and  has been authorized for detection and/or diagnosis of SARS-CoV-2 by FDA under an Emergency Use Authorization (EUA). This EUA will remain  in effect (meaning this test can be used) for the duration of the COVID-19 declaration under Section 564(b)(1) of the Act, 21 U.S.C.section 360bbb-3(b)(1), unless the authorization is terminated  or revoked sooner.       Influenza A by PCR NEGATIVE NEGATIVE Final   Influenza B by PCR NEGATIVE NEGATIVE Final    Comment: (NOTE) The Xpert Xpress SARS-CoV-2/FLU/RSV plus assay is intended as an aid in the diagnosis of influenza from Nasopharyngeal swab specimens and should not be used as a sole basis for treatment. Nasal washings and aspirates are unacceptable for Xpert Xpress SARS-CoV-2/FLU/RSV testing.  Fact Sheet for Patients: EntrepreneurPulse.com.au  Fact Sheet for  Healthcare Providers: IncredibleEmployment.be  This test is not yet approved or cleared by the [place] FDA and has been authorized for detection and/or diagnosis of SARS-CoV-2 by FDA under an Emergency Use Authorization (EUA). This EUA will remain in effect (meaning this test can be used) for the duration of the COVID-19 declaration under Section 564(b)(1) of the Act, 21 U.S.C. section 360bbb-3(b)(1), unless the authorization is terminated or revoked.  Performed at [facility], [address], [place],  [address]   Culture, blood (routine x 2)     Status: None   Collection Time: 08/18/20  9:04 AM  Specimen: BLOOD  Result Value Ref Range Status   Specimen Description BLOOD BLOOD RIGHT HAND  Final   Special Requests   Final    BOTTLES DRAWN AEROBIC AND ANAEROBIC Blood Culture adequate volume   Culture   Final    NO GROWTH 5 DAYS Performed at [facility], [address], [place], [place] [address]    Report Status 08/23/2020 FINAL  Final  Blood Culture (routine x 2)     Status: None (Preliminary result)   Collection Time: 08/24/20  4:47 AM   Specimen: BLOOD  Result Value Ref Range Status   Specimen Description BLOOD RIGHT ARM  Final   Special Requests   Final    BOTTLES DRAWN AEROBIC AND ANAEROBIC Blood Culture adequate volume   Culture   Final    NO GROWTH 2 DAYS Performed at [facility], [address], [place], [place] [address]    Report Status PENDING  Incomplete  Urine culture     Status: Abnormal   Collection Time: 08/24/20  4:47 AM   Specimen: In/Out Cath Urine  Result Value Ref Range Status   Specimen Description   Final    IN/OUT CATH URINE Performed at [facility], [address], [place], [place] [address]    Special Requests   Final    NONE Performed at [facility], [address], [place], [place] [address]    Culture 50,000 COLONIES/mL YEAST (A)  Final   Report Status 08/26/2020 FINAL  Final  Resp Panel by RT-PCR (Flu A&B,  Covid) Nasopharyngeal Swab     Status: None   Collection Time: 08/24/20  5:29 AM   Specimen: Nasopharyngeal Swab; Nasopharyngeal(NP) swabs in vial transport medium  Result Value Ref Range Status   SARS Coronavirus 2 by RT PCR NEGATIVE NEGATIVE Final    Comment: (NOTE) SARS-CoV-2 target nucleic acids are NOT DETECTED.  The SARS-CoV-2 RNA is generally detectable in upper respiratory specimens during the acute phase of infection. The lowest concentration of SARS-CoV-2 viral copies this assay can detect is 138 copies/mL. A negative result does not preclude SARS-Cov-2 infection and should not be used as the sole basis for treatment or other patient management decisions. A negative result may occur with  improper specimen collection/handling, submission of specimen other than nasopharyngeal swab, presence of viral mutation(s) within the areas targeted by this assay, and inadequate number of viral copies(<138 copies/mL). A negative result must be combined with clinical observations, patient history, and epidemiological information. The expected result is Negative.  Fact Sheet for Patients:  EntrepreneurPulse.com.au  Fact Sheet for Healthcare Providers:  IncredibleEmployment.be  This test is no t yet approved or cleared by the [place] FDA and  has been authorized for detection and/or diagnosis of SARS-CoV-2 by FDA under an Emergency Use Authorization (EUA). This EUA will remain  in effect (meaning this test can be used) for the duration of the COVID-19 declaration under Section 564(b)(1) of the Act, 21 U.S.C.section 360bbb-3(b)(1), unless the authorization is terminated  or revoked sooner.       Influenza A by PCR NEGATIVE NEGATIVE Final   Influenza B by PCR NEGATIVE NEGATIVE Final    Comment: (NOTE) The Xpert Xpress SARS-CoV-2/FLU/RSV plus assay is intended as an aid in the diagnosis of influenza from Nasopharyngeal swab specimens and should  not be used as a sole basis for treatment. Nasal washings and aspirates are unacceptable for Xpert Xpress SARS-CoV-2/FLU/RSV testing.  Fact Sheet for Patients: EntrepreneurPulse.com.au  Fact Sheet for Healthcare Providers: IncredibleEmployment.be  This test is not yet approved or cleared by the [place] and has been authorized for detection and/or diagnosis of SARS-CoV-2 by FDA under an Emergency Use Authorization (EUA). This EUA will remain in effect (meaning this test can be used) for the duration of the COVID-19 declaration under Section 564(b)(1) of the Act, 21 U.S.C. section 360bbb-3(b)(1), unless the authorization is terminated or revoked.  Performed at [facility], [address] [place],  [address]          Radiology Studies: No results found.      Scheduled Meds: . Chlorhexidine Gluconate Cloth  6 each Topical Daily  . enoxaparin (LOVENOX) injection  30 mg Subcutaneous Q24H   Continuous Infusions: . sodium chloride    . fluconazole (DIFLUCAN) IV    . levofloxacin (LEVAQUIN) IV 500 mg (08/26/20 0811)  . vancomycin 150 mL/hr at 08/26/20 0658     LOS: 2 days    Time spent: 40 minutes    Barb Merino, MD [facility] Pager [phone]

## 2020-08-26 NOTE — Progress Notes (Signed)
Chaplain return visit with daughter at pt's bedside per physician request.  Chaplain re-established relationship of care and concern.  Chaplain inquired of daughter what she knows about her mother's condition. Daughter report she's been told her mom won't recover from this, but she is still holding out hope.  Chaplain wondered aloud what mother's wishes would be at this time in her life.  Daughter expressed mother had children promise they would not put her in a nursing home.  Daughter expressed being unable to make sole decision about moving mom to comfort care.  Chaplain inquired if a family meeting w mother's physician would be helpful in that all would hear at same time about mother's condition and what medical interventions, if any,  can be offered at this time.  Daughter agreed this would be a great help, indicating her sister and brother could be here tomorrow morning to speak with doctor. Chaplain agreed to try and facilitate that meeting and to be present during the family meeting.  Chaplain prayed aloud for [name] and her siblings to receive a sign that would help them have a clear vision of what to lovingly do for their mother at this time.  Chaplain will continue to visit and care for [name]'s family.  [place]

## 2020-08-26 NOTE — Progress Notes (Signed)
Met with patient's 2 daughters separately today and spent good time with them explaining about patient's current situation.  I offered them that their mother is ready to convert to comfort care and is stop all active treatment and blood draws.  Both the daughters were understanding and very aware about terminal illness.  Both daughters were also agreeable that mom is suffering. They both want to talk to each other, with their brother and come to conclusion. I have continue to offer support, continue to offer low-dose Ativan and IV Dilaudid to keep patient comfortable while she is on treatment.  Plan: Anytime patient's family can come to conclusion about starting comfort care and hospice level of care, will change status and liberalize visitor policy.  Patient is likely to pass away in the hospital.+

## 2020-08-27 DIAGNOSIS — Z515 Encounter for palliative care: Secondary | ICD-10-CM

## 2020-08-27 LAB — COMPREHENSIVE METABOLIC PANEL
ALT: 18 U/L (ref 0–44)
AST: 25 U/L (ref 15–41)
Albumin: 2.2 g/dL — ABNORMAL LOW (ref 3.5–5.0)
Alkaline Phosphatase: 62 U/L (ref 38–126)
Anion gap: 9 (ref 5–15)
BUN: 24 mg/dL — ABNORMAL HIGH (ref 8–23)
CO2: 19 mmol/L — ABNORMAL LOW (ref 22–32)
Calcium: 7.7 mg/dL — ABNORMAL LOW (ref 8.9–10.3)
Chloride: 124 mmol/L — ABNORMAL HIGH (ref 98–111)
Creatinine, Ser: 0.82 mg/dL (ref 0.44–1.00)
GFR, Estimated: >60 mL/min (ref >60)
Glucose, Bld: 91 mg/dL (ref 70–99)
Potassium: 5.3 mmol/L — ABNORMAL HIGH (ref 3.5–5.1)
Sodium: 152 mmol/L — ABNORMAL HIGH (ref 135–145)
Total Bilirubin: 1 mg/dL (ref 0.3–1.2)
Total Protein: 5.4 g/dL — ABNORMAL LOW (ref 6.5–8.1)

## 2020-08-27 MED ORDER — ACETAMINOPHEN 325 MG PO TABS: 650.0000 mg | ORAL_TABLET | Freq: Four times a day (QID) | ORAL | Status: DC | PRN

## 2020-08-27 MED ORDER — HYDROMORPHONE HCL 1 MG/ML IJ SOLN
0.5000 mg | INTRAMUSCULAR | Status: DC | PRN
  Administered 2020-08-27 – 2020-08-30 (×4): 0.5 mg via INTRAVENOUS
  Filled 2020-08-27 (×4): qty 0.5

## 2020-08-27 MED ORDER — ACETAMINOPHEN 650 MG RE SUPP: 650.0000 mg | Freq: Four times a day (QID) | RECTAL | Status: DC | PRN

## 2020-08-27 MED ORDER — LORAZEPAM 1 MG PO TABS: 1.0000 mg | ORAL_TABLET | ORAL | Status: DC | PRN

## 2020-08-27 MED ORDER — ONDANSETRON HCL 4 MG/2ML IJ SOLN: 4.0000 mg | Freq: Four times a day (QID) | INTRAMUSCULAR | Status: DC | PRN

## 2020-08-27 MED ORDER — LORAZEPAM 2 MG/ML IJ SOLN
1.0000 mg | INTRAMUSCULAR | Status: DC | PRN
  Administered 2020-08-27 – 2020-08-29 (×4): 1 mg via INTRAVENOUS
  Filled 2020-08-27 (×4): qty 1

## 2020-08-27 MED ORDER — HALOPERIDOL 0.5 MG PO TABS: 0.5000 mg | ORAL_TABLET | ORAL | Status: DC | PRN

## 2020-08-27 MED ORDER — GLYCOPYRROLATE 0.2 MG/ML IJ SOLN
0.2000 mg | INTRAMUSCULAR | Status: DC | PRN
  Administered 2020-08-27 – 2020-08-30 (×3): 0.2 mg via INTRAVENOUS
  Filled 2020-08-27 (×4): qty 1

## 2020-08-27 MED ORDER — GLYCOPYRROLATE 1 MG PO TABS: 1.0000 mg | ORAL_TABLET | ORAL | Status: DC | PRN

## 2020-08-27 MED ORDER — LORAZEPAM 2 MG/ML PO CONC: 1.0000 mg | ORAL | Status: DC | PRN

## 2020-08-27 MED ORDER — GLYCOPYRROLATE 0.2 MG/ML IJ SOLN: 0.2000 mg | INTRAMUSCULAR | Status: DC | PRN

## 2020-08-27 MED ORDER — HALOPERIDOL LACTATE 2 MG/ML PO CONC
0.5000 mg | ORAL | Status: DC | PRN
  Filled 2020-08-27: qty 0.3

## 2020-08-27 MED ORDER — HALOPERIDOL LACTATE 5 MG/ML IJ SOLN: 0.5000 mg | INTRAMUSCULAR | Status: DC | PRN

## 2020-08-27 MED ORDER — ONDANSETRON 4 MG PO TBDP: 4.0000 mg | ORAL_TABLET | Freq: Four times a day (QID) | ORAL | Status: DC | PRN

## 2020-08-27 NOTE — Progress Notes (Signed)
PROGRESS NOTE    [name]  OVA:[id number] DOB: 1920/07/30 DOA: 08/24/2020 PCP: Iona Beard, MD    Brief Narrative:  86 year old female with history of advanced dementia at least severe for last 10 years, diastolic heart failure, Parkinson's, hypertension, COPD on 2 L home oxygen brought from home with respiratory distress.  Patient was recently admitted to the hospital and discharged 5 days ago where she was treated for probable aspiration pneumonitis.  She also developed rapid A. fib and treated with amiodarone.  No anticoagulation.  Family noticed progressive worsening cough at home, poor mentation and rattling noise so they brought her to the emergency room. In the emergency room temperature 106, heart rate 111, respiratory 39.  Lactic acid 3.3.  Urinalysis with leukocytes.  Chest x-ray without focal opacity.  Sepsis pathway was started and patient was treated with IV fluids and antibiotics after urine and blood cultures. Patient remains in very poor clinical condition with slim chances of recovery, admitted to ICU. 12/8-12/10: Ongoing discussion about patient not appropriately responding to treatment, advance debility and no improvement despite optimizing medical therapy and recommendations for comfort care measures.  12/10: Both daughters in agreement to keep patient comfortable, comfort care measures initiated.  Severe sepsis likely due to aspiration pneumonia, negative cultures.  Treated with IV antibiotics Levaquin, vancomycin and Diflucan for 72 hours with no clinical response. Acute kidney injury Paroxysmal A. fib Altered mental status/advanced debility in the setting of underlying dementia  Plan: Patient remains without clinical improvement despite being optimized on medical therapy, antibiotics and IV fluids. She remains with poor mental status, unable to wake up, mostly remains obtunded. Very uncomfortable with secretions in her mouth and throat, unable to clear and  cough. Patient's family agreed for comfort care measures, they want to continue IV fluids and oxygen support today. All comfort care medications are available, will use IV Dilaudid, IV Ativan, muscle relaxants, anticholinergics to control the secretions. Liberalize visitor policy, family member can come and visit the patient. No escalation of care.  No lab draws. Patient with likely diet in the hospital, family not ready to talk about home with hospice. RN to pronounce death if happens in the hospital.    DVT prophylaxis: Comfort care   Code Status: Comfort measures Family Communication: Patient's 2 daughters at the bedside. Disposition Plan: Status is: Inpatient  Remains inpatient appropriate because:Hemodynamically unstable   Dispo: The patient is from: Home              Anticipated d/c is to: Unknown.,  Anticipate hospital death              Anticipated d/c date is: Unknown.              Patient currently is not medically stable to d/c.   Consultants:   Palliative medicine  Procedures:   None  Antimicrobials:  Antibiotics Given (last 72 hours)    Date/Time Action Medication Dose Rate   08/26/20 0656 New Bag/Given   vancomycin (VANCOREADY) IVPB 750 mg/150 mL 750 mg 150 mL/hr   08/26/20 0811 New Bag/Given   levofloxacin (LEVAQUIN) IVPB 500 mg 500 mg 100 mL/hr         Subjective: Patient seen and examined.  Afebrile overnight.  Remains tachypneic, audible gurgling.  Not responding to commands.  She does have some spontaneous movements and moves on painful stimuli.  Objective: Vitals:   08/27/20 0900 08/27/20 1000 08/27/20 1100 08/27/20 1200  BP: (!) 141/119  (!) 162/137 (!) 177/153  Pulse: 72 72 78 74  Resp: (!) 31 (!) 22 (!) 32 (!) 22  Temp:      TempSrc:      SpO2: 100% 100% 100% 100%  Weight:      Height:        Intake/Output Summary (Last 24 hours) at 08/27/2020 1305 Last data filed at 08/27/2020 1137 Gross per 24 hour  Intake 2187.27 ml  Output  650 ml  Net 1537.27 ml   Filed Weights   08/24/20 1245  Weight: 71.4 kg    Examination:  Physical Exam Constitutional:      Comments: Very sick looking, profoundly debilitated and obtunded.  Withdraws on painful stimuli and tactile stimulation but does not follow commands.  HENT:     Head: Atraumatic.  Eyes:     Pupils: Pupils are equal, round, and reactive to light.  Cardiovascular:     Rate and Rhythm: Normal rate and regular rhythm.  Pulmonary:     Comments: Tachypneic, on 15 L of oxygen through nonrebreather.  Obviously distressed and audible upper airway noise.  Bilateral conducted airway sounds. Skin:    Comments: Anasarca.  Neurological:     Comments: Lethargic and obtunded.       Data Reviewed: I have personally reviewed following labs and imaging studies  CBC: Recent Labs  Lab 08/24/20 0447 08/25/20 0426 08/26/20 0430  WBC 34.4* 41.0* 28.0*  NEUTROABS 30.5*  --  25.3*  HGB 12.3 10.9* 9.1*  HCT 41.0 35.7* 31.6*  MCV 75.8* 73.9* 77.8*  PLT 345 201 517   Basic Metabolic Panel: Recent Labs  Lab 08/24/20 0447 08/25/20 0652 08/26/20 0430 08/27/20 0732  NA 151* 149* 152* 152*  K 3.6 3.3* 4.1 5.3*  CL 116* 119* 121* 124*  CO2 24 21* 23 19*  GLUCOSE 195* 150* 116* 91  BUN 37* 42* 38* 24*  CREATININE 1.50* 1.13* 1.14* 0.82  CALCIUM 8.4* 7.5* 7.9* 7.7*  MG  --   --  1.9  --   PHOS  --   --  2.4*  --    GFR: Estimated Creatinine Clearance: 34.6 mL/min (by C-G formula based on SCr of 0.82 mg/dL). Liver Function Tests: Recent Labs  Lab 08/24/20 0447 08/25/20 0652 08/26/20 0430 08/27/20 0732  AST 23 26 20 25   ALT 24 23 21 18   ALKPHOS 51 43 45 62  BILITOT 1.0 0.5 0.5 1.0  PROT 7.3 6.2* 6.0* 5.4*  ALBUMIN 3.3* 2.7* 2.6* 2.2*   No results for input(s): LIPASE, AMYLASE in the last 168 hours. No results for input(s): AMMONIA in the last 168 hours. Coagulation Profile: Recent Labs  Lab 08/24/20 0447  INR 1.3*   Cardiac Enzymes: Recent Labs   Lab 08/25/20 0652  CKTOTAL 429*   BNP (last 3 results) No results for input(s): PROBNP in the last 8760 hours. HbA1C: No results for input(s): HGBA1C in the last 72 hours. CBG: No results for input(s): GLUCAP in the last 168 hours. Lipid Profile: No results for input(s): CHOL, HDL, LDLCALC, TRIG, CHOLHDL, LDLDIRECT in the last 72 hours. Thyroid Function Tests: No results for input(s): TSH, T4TOTAL, FREET4, T3FREE, THYROIDAB in the last 72 hours. Anemia Panel: No results for input(s): VITAMINB12, FOLATE, FERRITIN, TIBC, IRON, RETICCTPCT in the last 72 hours. Sepsis Labs: Recent Labs  Lab 08/24/20 0447  LATICACIDVEN 3.3*    Recent Results (from the past 240 hour(s))  Culture, blood (routine x 2)     Status: None   Collection Time: 08/18/20  9:04 AM   Specimen: BLOOD  Result Value Ref Range Status   Specimen Description BLOOD BLOOD RIGHT HAND  Final   Special Requests   Final    BOTTLES DRAWN AEROBIC AND ANAEROBIC Blood Culture adequate volume   Culture   Final    NO GROWTH 5 DAYS Performed at [facility], [address] [place], [place] [address]    Report Status 08/23/2020 FINAL  Final  Blood Culture (routine x 2)     Status: None (Preliminary result)   Collection Time: 08/24/20  4:47 AM   Specimen: BLOOD  Result Value Ref Range Status   Specimen Description BLOOD RIGHT ARM  Final   Special Requests   Final    BOTTLES DRAWN AEROBIC AND ANAEROBIC Blood Culture adequate volume   Culture   Final    NO GROWTH 3 DAYS Performed at [facility], [address] [place], [place] [address]    Report Status PENDING  Incomplete  Urine culture     Status: Abnormal   Collection Time: 08/24/20  4:47 AM   Specimen: In/Out Cath Urine  Result Value Ref Range Status   Specimen Description   Final    IN/OUT CATH URINE Performed at [facility] [address] [place], [place] [address]    Special Requests   Final    NONE Performed at [facility], [address],  [place], [place] [address]    Culture 50,000 COLONIES/mL YEAST (A)  Final   Report Status 08/26/2020 FINAL  Final  Resp Panel by RT-PCR (Flu A&B, Covid) Nasopharyngeal Swab     Status: None   Collection Time: 08/24/20  5:29 AM   Specimen: Nasopharyngeal Swab; Nasopharyngeal(NP) swabs in vial transport medium  Result Value Ref Range Status   SARS Coronavirus 2 by RT PCR NEGATIVE NEGATIVE Final    Comment: (NOTE) SARS-CoV-2 target nucleic acids are NOT DETECTED.  The SARS-CoV-2 RNA is generally detectable in upper respiratory specimens during the acute phase of infection. The lowest concentration of SARS-CoV-2 viral copies this assay can detect is 138 copies/mL. A negative result does not preclude SARS-Cov-2 infection and should not be used as the sole basis for treatment or other patient management decisions. A negative result may occur with  improper specimen collection/handling, submission of specimen other than nasopharyngeal swab, presence of viral mutation(s) within the areas targeted by this assay, and inadequate number of viral copies(<138 copies/mL). A negative result must be combined with clinical observations, patient history, and epidemiological information. The expected result is Negative.  Fact Sheet for Patients:  EntrepreneurPulse.com.au  Fact Sheet for Healthcare Providers:  IncredibleEmployment.be  This test is no t yet approved or cleared by the [place] FDA and  has been authorized for detection and/or diagnosis of SARS-CoV-2 by FDA under an Emergency Use Authorization (EUA). This EUA will remain  in effect (meaning this test can be used) for the duration of the COVID-19 declaration under Section 564(b)(1) of the Act, 21 U.S.C.section 360bbb-3(b)(1), unless the authorization is terminated  or revoked sooner.       Influenza A by PCR NEGATIVE NEGATIVE Final   Influenza B by PCR NEGATIVE NEGATIVE Final    Comment:  (NOTE) The Xpert Xpress SARS-CoV-2/FLU/RSV plus assay is intended as an aid in the diagnosis of influenza from Nasopharyngeal swab specimens and should not be used as a sole basis for treatment. Nasal washings and aspirates are unacceptable for Xpert Xpress SARS-CoV-2/FLU/RSV testing.  Fact Sheet for Patients: EntrepreneurPulse.com.au  Fact Sheet  for Healthcare Providers: IncredibleEmployment.be  This test is not yet approved or cleared by the [place] and has been authorized for detection and/or diagnosis of SARS-CoV-2 by FDA under an Emergency Use Authorization (EUA). This EUA will remain in effect (meaning this test can be used) for the duration of the COVID-19 declaration under Section 564(b)(1) of the Act, 21 U.S.C. section 360bbb-3(b)(1), unless the authorization is terminated or revoked.  Performed at [facility], [address], [place],  [address]          Radiology Studies: No results found.      Scheduled Meds: . Chlorhexidine Gluconate Cloth  6 each Topical Daily   Continuous Infusions: . sodium chloride Stopped (08/27/20 1222)     LOS: 3 days    Time spent: 40 minutes    Barb Merino, MD [facility] Pager [phone]

## 2020-08-27 NOTE — Progress Notes (Signed)
Chaplain attended to Ms. [name]'s family multiple times throughout the day.  Chaplain attended family meeting with MD as they made decision to move their mother to comfort care.  Chaplain offered ministry of presence as family members expressed disappointment (anger) over how they had been treated rudely multiple times during their mother's stay.  Family members reported having felt "heard, understood, and cared for" by Bonney Roussel and MD, for which they were grateful.  Chaplain advocated with [facility] that extended family members be allowed to come to visit (4 at a time) now that Ms. [name] has been moved to comfort care.   On last visit of the day, Ms. [name] was much more restful. Her family was much less stressed, waiting quietly for the will of the Lord to prevail.   Chaplain felt peace in the room.    [place]

## 2020-08-28 DIAGNOSIS — Z515 Encounter for palliative care: Secondary | ICD-10-CM

## 2020-08-28 NOTE — Progress Notes (Signed)
PROGRESS NOTE    [name]  LKG:[id number] DOB: [birth year] DOA: 08/24/2020 PCP: Iona Beard, MD    Brief Narrative:  86 year old female with history of advanced dementia at least severe for last 10 years, diastolic heart failure, Parkinson's, hypertension, COPD on 2 L home oxygen brought from home with respiratory distress.  Patient was recently admitted to the hospital and discharged 5 days ago where she was treated for probable aspiration pneumonitis.  She also developed rapid A. fib and treated with amiodarone.  No anticoagulation.  Family noticed progressive worsening cough at home, poor mentation and rattling noise so they brought her to the emergency room. In the emergency room temperature 106, heart rate 111, respiratory 39.  Lactic acid 3.3.  Urinalysis with leukocytes.  Chest x-ray without focal opacity.  Sepsis pathway was started and patient was treated with IV fluids and antibiotics after urine and blood cultures. Patient remains in very poor clinical condition with slim chances of recovery, admitted to ICU. 12/8-12/10: Ongoing discussion about patient not appropriately responding to treatment, advance debility and no improvement despite optimizing medical therapy and recommendations for comfort care measures.  12/10: Both daughters in agreement to keep patient comfortable, comfort care measures initiated.  Severe sepsis likely due to aspiration pneumonia, negative cultures.  Treated with IV antibiotics Levaquin, vancomycin and Diflucan for 72 hours with no clinical response. Acute kidney injury Paroxysmal A. fib Altered mental status/advanced debility in the setting of underlying dementia  Plan: Patient remained without clinical improvement despite being optimized on medical therapy, antibiotics and IV fluids. She remained with poor mental status, unable to wake up, mostly remains obtunded. Very uncomfortable with secretions in her mouth and throat, unable to clear and  cough. Patient's family agreed for comfort care measures on 12/11 All comfort care medications are available, will use IV Dilaudid, IV Ativan, muscle relaxants, anticholinergics to control the secretions. Liberalize visitor policy, family member can come and visit the patient. No escalation of care.  No lab draws. Patient with likely die in the hospital, family not ready to talk about home with hospice. RN to pronounce death if happens in the hospital.  Patient Active Problem List   Diagnosis Date Noted  . End of life care 08/27/2020  . Sepsis ([place]) 08/24/2020  . Hypernatremia 08/24/2020  . AKI (acute kidney injury) ([place]) 08/24/2020  . AF (paroxysmal atrial fibrillation) ([place]) 08/24/2020  . COPD (chronic obstructive pulmonary disease) ([place]) 08/24/2020  . Severe sepsis ([place]) 08/16/2020  . Closed fracture of distal end of right femur, initial encounter ([place]) 03/18/2019  . Acute on chronic respiratory failure ([place]) 09/26/2018  . Acute on chronic diastolic CHF (congestive heart failure) ([place]) 04/28/2018  . Dysarthria 10/18/2017  . Acute metabolic encephalopathy 66/44/0347  . Chronic diastolic CHF (congestive heart failure) ([place]) 10/17/2017  . Fever   . COPD with acute bronchitis ([place]) 10/16/2017  . Dyspnea 01/06/2015  . Essential hypertension 01/06/2015  . Depression with anxiety 01/06/2015  . Insomnia 01/06/2015  . Acute on chronic diastolic congestive heart failure ([place])   . Lesion of liver 09/17/2013  . Microcytic anemia 04/05/2013  . Elevated troponin 04/04/2013  . Fall 01/31/2013  . Dementia with behavioral disturbance ([place]) 01/31/2013  . Parkinson disease ([place]) 01/31/2013  . Community acquired pneumonia 06/30/2012  . Weakness generalized 06/30/2012    DVT prophylaxis: Comfort care   Code Status: Comfort measures Family Communication: Patient's 2 daughters at the bedside 12/10. Disposition Plan: Status is: Inpatient  Remains inpatient appropriate  because:Hemodynamically  unstable   Dispo: The patient is from: Home              Anticipated d/c is to: Unknown.,  Anticipate hospital death              Anticipated d/c date is: Unknown.              Patient currently is not medically stable to d/c.   Consultants:   Palliative medicine  Procedures:   None  Antimicrobials:  Antibiotics Given (last 72 hours)    Date/Time Action Medication Dose Rate   08/26/20 0656 New Bag/Given   vancomycin (VANCOREADY) IVPB 750 mg/150 mL 750 mg 150 mL/hr   08/26/20 0811 New Bag/Given   levofloxacin (LEVAQUIN) IVPB 500 mg 500 mg 100 mL/hr         Subjective: Patient seen and examined.  She was medicated for comfort.  She is not able to respond, however looks very comfortable and calm.  Objective: Vitals:   08/28/20 0600 08/28/20 0700 08/28/20 0736 08/28/20 0802  BP:      Pulse: 75 77 72   Resp: (!) 23 (!) 32 (!) 26   Temp:    98.7 F (37.1 C)  TempSrc:    Oral  SpO2: 98% 99% 100%   Weight:      Height:        Intake/Output Summary (Last 24 hours) at 08/28/2020 1126 Last data filed at 08/28/2020 0736 Gross per 24 hour  Intake 808.33 ml  Output 400 ml  Net 408.33 ml   Filed Weights   08/24/20 1245  Weight: 71.4 kg    Examination:  Physical Exam Constitutional:      Comments: Sick looking, profoundly debilitated and obtunded.  Withdraws on stimuli but no response.  HENT:     Head: Atraumatic.  Eyes:     Pupils: Pupils are equal, round, and reactive to light.  Cardiovascular:     Rate and Rhythm: Normal rate and regular rhythm.  Pulmonary:     Comments: Comfortable looking on minimum oxygen. Skin:    Comments: Anasarca.  Neurological:     Comments: Lethargic and obtunded.       Data Reviewed: I have personally reviewed following labs and imaging studies  CBC: Recent Labs  Lab 08/24/20 0447 08/25/20 0426 08/26/20 0430  WBC 34.4* 41.0* 28.0*  NEUTROABS 30.5*  --  25.3*  HGB 12.3 10.9* 9.1*  HCT 41.0  35.7* 31.6*  MCV 75.8* 73.9* 77.8*  PLT 345 201 035   Basic Metabolic Panel: Recent Labs  Lab 08/24/20 0447 08/25/20 0652 08/26/20 0430 08/27/20 0732  NA 151* 149* 152* 152*  K 3.6 3.3* 4.1 5.3*  CL 116* 119* 121* 124*  CO2 24 21* 23 19*  GLUCOSE 195* 150* 116* 91  BUN 37* 42* 38* 24*  CREATININE 1.50* 1.13* 1.14* 0.82  CALCIUM 8.4* 7.5* 7.9* 7.7*  MG  --   --  1.9  --   PHOS  --   --  2.4*  --    GFR: Estimated Creatinine Clearance: 34.6 mL/min (by C-G formula based on SCr of 0.82 mg/dL). Liver Function Tests: Recent Labs  Lab 08/24/20 0447 08/25/20 0652 08/26/20 0430 08/27/20 0732  AST 23 26 20 25   ALT 24 23 21 18   ALKPHOS 51 43 45 62  BILITOT 1.0 0.5 0.5 1.0  PROT 7.3 6.2* 6.0* 5.4*  ALBUMIN 3.3* 2.7* 2.6* 2.2*   No results for input(s): LIPASE, AMYLASE in the last 168  hours. No results for input(s): AMMONIA in the last 168 hours. Coagulation Profile: Recent Labs  Lab 08/24/20 0447  INR 1.3*   Cardiac Enzymes: Recent Labs  Lab 08/25/20 0652  CKTOTAL 429*   BNP (last 3 results) No results for input(s): PROBNP in the last 8760 hours. HbA1C: No results for input(s): HGBA1C in the last 72 hours. CBG: No results for input(s): GLUCAP in the last 168 hours. Lipid Profile: No results for input(s): CHOL, HDL, LDLCALC, TRIG, CHOLHDL, LDLDIRECT in the last 72 hours. Thyroid Function Tests: No results for input(s): TSH, T4TOTAL, FREET4, T3FREE, THYROIDAB in the last 72 hours. Anemia Panel: No results for input(s): VITAMINB12, FOLATE, FERRITIN, TIBC, IRON, RETICCTPCT in the last 72 hours. Sepsis Labs: Recent Labs  Lab 08/24/20 0447  LATICACIDVEN 3.3*    Recent Results (from the past 240 hour(s))  Blood Culture (routine x 2)     Status: None (Preliminary result)   Collection Time: 08/24/20  4:47 AM   Specimen: BLOOD  Result Value Ref Range Status   Specimen Description BLOOD RIGHT ARM  Final   Special Requests   Final    BOTTLES DRAWN AEROBIC AND  ANAEROBIC Blood Culture adequate volume   Culture   Final    NO GROWTH 4 DAYS Performed at [facility], [address] [place], [place] [address]    Report Status PENDING  Incomplete  Urine culture     Status: Abnormal   Collection Time: 08/24/20  4:47 AM   Specimen: In/Out Cath Urine  Result Value Ref Range Status   Specimen Description   Final    IN/OUT CATH URINE Performed at [facility], [address] [place], [place] [address]    Special Requests   Final    NONE Performed at [facility], [address] [place],  [address]    Culture 50,000 COLONIES/mL YEAST (A)  Final   Report Status 08/26/2020 FINAL  Final  Resp Panel by RT-PCR (Flu A&B, Covid) Nasopharyngeal Swab     Status: None   Collection Time: 08/24/20  5:29 AM   Specimen: Nasopharyngeal Swab; Nasopharyngeal(NP) swabs in vial transport medium  Result Value Ref Range Status   SARS Coronavirus 2 by RT PCR NEGATIVE NEGATIVE Final    Comment: (NOTE) SARS-CoV-2 target nucleic acids are NOT DETECTED.  The SARS-CoV-2 RNA is generally detectable in upper respiratory specimens during the acute phase of infection. The lowest concentration of SARS-CoV-2 viral copies this assay can detect is 138 copies/mL. A negative result does not preclude SARS-Cov-2 infection and should not be used as the sole basis for treatment or other patient management decisions. A negative result may occur with  improper specimen collection/handling, submission of specimen other than nasopharyngeal swab, presence of viral mutation(s) within the areas targeted by this assay, and inadequate number of viral copies(<138 copies/mL). A negative result must be combined with clinical observations, patient history, and epidemiological information. The expected result is Negative.  Fact Sheet for Patients:  EntrepreneurPulse.com.au  Fact Sheet for Healthcare Providers:  IncredibleEmployment.be  This test is  no t yet approved or cleared by the [place] FDA and  has been authorized for detection and/or diagnosis of SARS-CoV-2 by FDA under an Emergency Use Authorization (EUA). This EUA will remain  in effect (meaning this test can be used) for the duration of the COVID-19 declaration under Section 564(b)(1) of the Act, 21 U.S.C.section 360bbb-3(b)(1), unless the authorization is terminated  or revoked sooner.       Influenza  A by PCR NEGATIVE NEGATIVE Final   Influenza B by PCR NEGATIVE NEGATIVE Final    Comment: (NOTE) The Xpert Xpress SARS-CoV-2/FLU/RSV plus assay is intended as an aid in the diagnosis of influenza from Nasopharyngeal swab specimens and should not be used as a sole basis for treatment. Nasal washings and aspirates are unacceptable for Xpert Xpress SARS-CoV-2/FLU/RSV testing.  Fact Sheet for Patients: EntrepreneurPulse.com.au  Fact Sheet for Healthcare Providers: IncredibleEmployment.be  This test is not yet approved or cleared by the [place] FDA and has been authorized for detection and/or diagnosis of SARS-CoV-2 by FDA under an Emergency Use Authorization (EUA). This EUA will remain in effect (meaning this test can be used) for the duration of the COVID-19 declaration under Section 564(b)(1) of the Act, 21 U.S.C. section 360bbb-3(b)(1), unless the authorization is terminated or revoked.  Performed at [facility], [address] [place], [place] [address]          Radiology Studies: No results found.      Scheduled Meds:  Continuous Infusions: . sodium chloride Stopped (08/27/20 1222)     LOS: 4 days    Time spent:  30 minutes    Barb Merino, MD [facility] Pager [phone]

## 2020-08-28 NOTE — Progress Notes (Signed)
Patient given full bath, peri care, and oral care. All linens, external catheter, and suction equipment changed. Patient tolerated activity well and is resting comfortably. Family member updated this morning on patient's condition and morning care.

## 2020-08-29 LAB — CULTURE, BLOOD (ROUTINE X 2)
Culture: NO GROWTH
Special Requests: ADEQUATE

## 2020-08-29 MED ORDER — MORPHINE SULFATE (CONCENTRATE) 10 MG/0.5ML PO SOLN
10.0000 mg | ORAL | Status: DC | PRN
  Administered 2020-08-29 – 2020-08-30 (×2): 10 mg via ORAL
  Filled 2020-08-29 (×2): qty 0.5

## 2020-08-29 NOTE — TOC Initial Note (Signed)
Transition of Care [facility]) - Initial/Assessment Note    Patient Details  Name: [name] MRN: [id number] Date of Birth: 1920-09-14  Transition of Care [facility]) CM/SW Contact:    Shade Flood, LCSW Phone Number: 08/29/2020, 3:13 PM  Clinical Narrative:                  Received consult from MD indicating pt family now stating that they would like to take pt home. Per MD, they were interested in discussing possible option of hospice. This LCSW spoke with pt's daughter, [name], by phone as wells as pt's daughter, Shauna Hugh. [name] indicated that they want to take pt home but they do not want hospice. [name] stated that she and [name] are going to be at the hospital together tomorrow and they will make a final decision about taking pt home either with or without hospice. [name] stated that they do have all needed DME at home.  Assigned TOC will follow up tomorrow and assist as needed.  Expected Discharge Plan: Home w Hospice Care Barriers to Discharge: Other (comment) (hospice referral pending)   Patient Goals and CMS Choice   CMS Medicare.gov Compare Post Acute Care list provided to:: Patient Represenative (must comment) Choice offered to / list presented to : Adult Children  Expected Discharge Plan and Services Expected Discharge Plan: Home w Hospice Care In-house Referral: Clinical Social Work   Post Acute Care Choice: Hospice Living arrangements for the past 2 months: [place]                                      Prior Living Arrangements/Services Living arrangements for the past 2 months: Single Family Home Lives with:: Adult Children Patient language and need for interpreter reviewed:: Yes Do you feel safe going back to the place where you live?: Yes      Need for Family Participation in Patient Care: Yes (Comment) Care giver support system in place?: Yes (comment)   Criminal Activity/Legal Involvement Pertinent to Current Situation/Hospitalization: No -  Comment as needed  Activities of Daily Living      Permission Sought/Granted                  Emotional Assessment       Orientation: : Oriented to Self Alcohol / Substance Use: Not Applicable Psych Involvement: No (comment)  Admission diagnosis:  Hypoxia [R09.02] Sepsis ([place]) [A41.9] Fever, unspecified fever cause [R50.9] Patient Active Problem List   Diagnosis Date Noted  . End of life care 08/27/2020  . Sepsis ([place]) 08/24/2020  . Hypernatremia 08/24/2020  . AKI (acute kidney injury) ([place]) 08/24/2020  . AF (paroxysmal atrial fibrillation) ([place]) 08/24/2020  . COPD (chronic obstructive pulmonary disease) ([place]) 08/24/2020  . Severe sepsis ([place]) 08/16/2020  . Closed fracture of distal end of right femur, initial encounter ([place]) 03/18/2019  . Acute on chronic respiratory failure ([place]) 09/26/2018  . Acute on chronic diastolic CHF (congestive heart failure) ([place]) 04/28/2018  . Dysarthria 10/18/2017  . Acute metabolic encephalopathy 40/98/1191  . Chronic diastolic CHF (congestive heart failure) ([place]) 10/17/2017  . Fever   . COPD with acute bronchitis ([place]) 10/16/2017  . Dyspnea 01/06/2015  . Essential hypertension 01/06/2015  . Depression with anxiety 01/06/2015  . Insomnia 01/06/2015  . Acute on chronic diastolic congestive heart failure ([place])   . Lesion of liver 09/17/2013  . Microcytic anemia 04/05/2013  . Elevated  troponin 04/04/2013  . Fall 01/31/2013  . Dementia with behavioral disturbance ([place]) 01/31/2013  . Parkinson disease ([place]) 01/31/2013  . Community acquired pneumonia 06/30/2012  . Weakness generalized 06/30/2012   PCP:  Iona Beard, MD Pharmacy:   [place], [place] - [address] [place] [address] Phone: [phone] Fax: [phone]     Social Determinants of Health (SDOH) Interventions    Readmission Risk Interventions No flowsheet data found.

## 2020-08-29 NOTE — Progress Notes (Addendum)
Patient remains on full comfort care measures.  She looks very comfortable and medicated for comfort.  End-of-life care: All symptom control medications available. Patient is obtunded and unable to eat. I have updated family, met with daughter at bedside 12/11. Family not ready to talk about hospice.  They do not think they can manage her at home. Anticipate hospital death.  Total time spent: 22 minutes  Daughters want to explore whether they can take her home. I told them that patient can be taken home but will need hospice in place. They will make final decision tomorrow.

## 2020-08-30 MED ORDER — OXYCODONE HCL 20 MG/ML PO CONC: 10.0000 mg | ORAL | 0 refills | Status: AC | PRN

## 2020-08-30 MED ORDER — ACETAMINOPHEN 500 MG PO TABS: 500.0000 mg | ORAL_TABLET | Freq: Four times a day (QID) | ORAL | 0 refills | Status: AC | PRN

## 2020-08-30 MED ORDER — HALOPERIDOL LACTATE 2 MG/ML PO CONC: 2.0000 mg | ORAL | 0 refills | Status: AC | PRN

## 2020-08-30 NOTE — Progress Notes (Signed)
Patient experiencing dyspnea at rest this am. Patient was also moved in bed and was grimacing. Dilaudid 0.5 mg given to patient administered to patient per PRN order. Will continue to assess patient for dyspnea and pain.

## 2020-08-30 NOTE — Progress Notes (Signed)
Patient still experiencing shortness of breath at this time. Morphine concentrate 10 mg oral solution administered to patient per PRN order. Will continue to assess patient for severe pain and shortness of breath.

## 2020-08-30 NOTE — Progress Notes (Signed)
Patient seen and examined.  Remains in total comfort care.  Patient was medicated with oral morphine and Ativan and was very difficult to give oral medicine because she would not open her mouth. Patient was medicated with IV Dilaudid earlier morning.  She looks in mild distress, totally obtunded and not responding.  End-of-life care: Patient's family is deciding whether to take her home for end-of-life care with hospice in place.  Continue supportive measures until then.  Total time spent: 20 minutes

## 2020-08-30 NOTE — Progress Notes (Signed)
Informed by secretary that patient's family requested to speak with patient's nurse. I introduced myself to patient's family and asked if they had any questions or needed anything. Patient's daughter said that "I'm going to be nice all I want is for her to go home" I told patient's daughter I will contact MD to inform him of her request. I observed that patient's daughter was angry and I asked her if there was anything I can do to help her. Patient's daughter stated that "it's personal and did not want to discuss it with me" I informed patient's daughter that I administered pain medication to patient and explained medications patient received this am. Patient's daughter became immediately angry and said "I can see that" patient's daughter then said "I want to take her home now" I informed patient's daughter that the MD was on his way to speak with her. MD currently in room with patient and her daughter.

## 2020-08-30 NOTE — TOC Transition Note (Signed)
Transition of Care [facility]) - CM/SW Discharge Note  Patient Details  Name: [name] MRN: [id number] Date of Birth: 1920/07/19  Transition [facility]) CM/SW Contact:  Sherie Don, LCSW Phone Number: 08/30/2020, 1:19 PM  Clinical Narrative: Patient's family would prefer to take patient home and are declining hospice services. CSW confirmed with patient's daughter the family has home O2. EMS scheduled; medical necessity form completed and provided to RN. TOC signing off.  Final next level of care: Home/Self Care Barriers to Discharge: Barriers Resolved  Patient Goals and CMS Choice Patient states their goals for this hospitalization and ongoing recovery are:: Take patient home to be with family CMS Medicare.gov Compare Post Acute Care list provided to:: Patient Represenative (must comment) Choice offered to / list presented to : Adult Children  Discharge Plan and Services In-house Referral: Clinical Social Work Post Acute Care Choice: Hospice          DME Arranged: N/A DME Agency: NA HH Arranged: NA HH Agency: NA  Readmission Risk Interventions No flowsheet data found.

## 2020-08-30 NOTE — Care Management Important Message (Signed)
Important Message  Patient Details  Name: [name] MRN: [id number] Date of Birth: [birth year]   Medicare Important Message Given:  Yes     Tommy Medal 08/30/2020, 2:19 PM

## 2020-08-30 NOTE — Progress Notes (Signed)
Glycopyrrolate ([place]ul) injection 0.2 mg given to patient this am for excessive secretions per PRN order.

## 2020-08-30 NOTE — Progress Notes (Signed)
Chaplain offered follow-up care with the pt's family.  Family is exhausted and deeply grieving; questioning care being given; wanting to take their mother home where there will be no more medical interventions and [employer] C[employer] can work His will in her life.  Chaplain offered ministry of presence by listening to their concerns without comment. Listened as family told stories of their mom's great love for them, for feeding them when there was no food while not eating herself.  Listening to their expressions of grief.  Hearing their faith story and upholding them in their faith.  This is a close multi-generational family as evidenced by pt's 47 yo great-grandson sending his MaMa his blanket. Chaplain's assessment is the daughters and pt's sister are emotionally fragile and worn out. Yet strong in their faith which is all that is sustaining them right now.  Family expresses their greatest need at this point is help getting their mother home, in her own bed, surrounded by her family where [employer] C[employer] can work His will out in their mother's life.  Chaplain prayed with family.  Chaplain consulted with Patient Experience representative and pt's primary nurse.    [place]

## 2020-08-30 NOTE — Discharge Summary (Signed)
Physician Discharge Summary  [name] VHQ:[id number] DOB: 1919-10-23 DOA: 08/24/2020  PCP: Iona Beard, MD  Admit date: 08/24/2020 Discharge date: 08/30/2020  Admitted From: Home Disposition: Home with family  Recommendations for Outpatient Follow-up:  1. Please schedule communication with primary care physician, family declined me to communicate with primary care physician.  Home Health: Not applicable Equipment/Devices: Present at home, hospital bed and oxygen  Discharge Condition: Critical CODE STATUS: DNR Diet recommendation: N.p.o., comfort food if able to swallow safely.  Discharge summary: 86 year old female with history of advanced dementia , wheelchair-bound, diastolic heart failure, Parkinson's, hypertension, COPD on 2 L home oxygen brought from home with respiratory distress.  Patient was recently admitted to the hospital and discharged 5 days ago where she was treated for probable aspiration pneumonitis.  She also developed rapid A. fib and treated with amiodarone.  No anticoagulation.  Family noticed progressive worsening cough at home, poor mentation and rattling noise so they brought her to the emergency room. In the emergency room temperature 106, heart rate 111, respiratory 39.  Lactic acid 3.3.  Urinalysis with leukocytes.  Chest x-ray without focal opacity.  Sepsis pathway was started and patient was treated with IV fluids and antibiotics after drawing urine and blood cultures.  12/8-12/10: Ongoing discussion about patient not appropriately responding to treatment, advanced debility and no improvement despite optimizing medical therapy and recommendations for comfort care measures. 12/10: Both daughters in agreement to keep patient comfortable, comfort care measures initiated. 12/11-12/12: Patient with comfort care measures in the hospital, using different medications including Ativan, Dilaudid.  Remained unresponsive but comfortable after medicated.  No oral intake as  she was not safely able to eat.  Discharging home with hospice discussion ongoing, however family were not sure. 12/13: Patient remains with poor mental status, unable to wake up, mostly obtunded.  Occasionally increased respiration with rattling noises.  She was given IV Dilaudid and Ativan and also was tried on concentrated oral solutions that she was able to keep under the tongue. Patient is not awake to safely swallow.  Family discussion/plan of care: 12/13 patient's family, mainly daughters visited patient in the hospital and requested to take patient home. Hospice care at home was offered and declined by the family. Comfort care medications were offered, the family declined.  I have sent some concentrated Haldol solutions and oxycodone solution to the pharmacy if they need in the future. Patient is near end-of-life, family desired to keep her home which is very appropriate and in best interest to the patient.  Patient's daughter declined hospice referral, declined need to communicate with primary care physician and just wanted to take their mom home. Every effort was made by this provider and other axillary staff to help, communicate and coordinate care, educate about hospice philosophy or end-of-life care philosophy to the patient's family.  Severe sepsis likely due to aspiration pneumonia,  Acute kidney injury Paroxysmal A. fib Acute metabolic encephalopathy /advanced debility/failure to thrive in the setting of underlying dementia   Discharge Diagnoses:  Principal Problem:   Sepsis ([place]) Active Problems:   Dementia with behavioral disturbance ([place])   Parkinson disease ([place])   Essential hypertension   Depression with anxiety   Acute metabolic encephalopathy   Chronic diastolic CHF (congestive heart failure) (HCC)   Acute on chronic respiratory failure (HCC)   Hypernatremia   AKI (acute kidney injury) (HCC)   AF (paroxysmal atrial fibrillation) (HCC)   COPD (chronic obstructive  pulmonary disease) (HCC)   End of life  care    Discharge Instructions  Discharge Instructions    Diet general   Complete by: As directed    Comfort feeding if she is awake and can safely swallow   Discharge instructions   Complete by: As directed    Give patient any food if she is awake and safely able to swallow, other wise feeding may make her uncomfortable. Have prescribed medicine haloperidol solution that can be used for anxiety and agitation , discomfort.  Also have prescribed pain medicine called oxycodone concentrated liquid that can be dropped in the mouth that will help with pain, discomfort. If you need extra help with medications , support at home you can call local hospice provider, call your doctors office for guidance.   No wound care   Complete by: As directed    No wound care   Complete by: As directed      Allergies as of 08/30/2020      Reactions   Adhesive [tape] Rash   Ensure Pudding [nutritional Supplements] Swelling, Rash   Throat swelling   Penicillins Swelling, Rash   Has patient had a PCN reaction causing immediate rash, facial/tongue/throat swelling, SOB or lightheadedness with hypotension: Yes Has patient had a PCN reaction causing severe rash involving mucus membranes or skin necrosis: Yes Has patient had a PCN reaction that required hospitalization No Has patient had a PCN reaction occurring within the last 10 years: No If all of the above answers are "NO", then may proceed with Cephalosporin use.      Medication List    STOP taking these medications   amiodarone 200 MG tablet Commonly known as: PACERONE   aspirin EC 81 MG tablet   ferrous sulfate 325 (65 FE) MG EC tablet   metoprolol tartrate 50 MG tablet Commonly known as: LOPRESSOR     TAKE these medications   acetaminophen 500 MG tablet Commonly known as: TYLENOL Take 1 tablet (500 mg total) by mouth every 6 (six) hours as needed. For pain What changed:   when to take  this  reasons to take this   benztropine 1 MG tablet Commonly known as: COGENTIN Take 1 tablet (1 mg total) by mouth daily.   citalopram 20 MG tablet Commonly known as: CELEXA Take 1 tablet (20 mg total) by mouth every morning.   glycopyrrolate 1 MG tablet Commonly known as: Robinul Take 1 tablet (1 mg total) by mouth 3 (three) times daily.   haloperidol 2 MG/ML solution Commonly known as: HALDOL Place 1 mL (2 mg total) under the tongue every 4 (four) hours as needed for agitation (or delirium).   ipratropium-albuterol 0.5-2.5 (3) MG/3ML Soln Commonly known as: DUONEB Take 3 mLs by nebulization 4 (four) times daily.   oxyCODONE 20 MG/ML concentrated solution Commonly known as: ROXICODONE INTENSOL Take 0.5 mLs (10 mg total) by mouth every 4 (four) hours as needed for up to 5 days for severe pain (anxiety, distress, comfort care).   pantoprazole 40 MG tablet Commonly known as: Protonix Take 1 tablet (40 mg total) by mouth daily.   rivastigmine 9.5 mg/24hr Commonly known as: EXELON Place 1 patch (9.5 mg total) onto the skin daily. *Remove and discard used patches*   zolpidem 10 MG tablet Commonly known as: AMBIEN Take 0.5 tablets (5 mg total) by mouth at bedtime as needed for sleep.       Allergies  Allergen Reactions  . Adhesive [Tape] Rash  . Ensure Pudding [Nutritional Supplements] Swelling and Rash  Throat swelling  . Penicillins Swelling and Rash    Has patient had a PCN reaction causing immediate rash, facial/tongue/throat swelling, SOB or lightheadedness with hypotension: Yes Has patient had a PCN reaction causing severe rash involving mucus membranes or skin necrosis: Yes Has patient had a PCN reaction that required hospitalization No Has patient had a PCN reaction occurring within the last 10 years: No If all of the above answers are "NO", then may proceed with Cephalosporin use.    Consultations:  Palliative medicine   Procedures/Studies: CT  ABDOMEN PELVIS WO CONTRAST  Result Date: 08/24/2020 CLINICAL DATA:  Abdominal pain, coughing up brown stuff, trouble breathing, severe dementia, unable to provide additional history; past history of breast cancer, CHF, hypertension, Alzheimer's, Parkinson's, COPD EXAM: CT ABDOMEN AND PELVIS WITHOUT CONTRAST TECHNIQUE: Multidetector CT imaging of the abdomen and pelvis was performed following the standard protocol without IV contrast. Sagittal and coronal MPR images reconstructed from axial data set. IV contrast was administered but the bulk of the contrast dosed extravasated in the RIGHT upper extremity; routine post extravasation orders provided. No oral contrast. Beam hardening artifacts in upper abdomen due to patient's arms and extravasated contrast material. COMPARISON:  08/16/2020 FINDINGS: Lower chest: Bibasilar infiltrates and atelectasis. Enlargement of cardiac chambers. Coronary arterial and aortic calcifications. Hepatobiliary: Numerous hepatic cysts, largest 9.5 x 7.4 cm image 24. No other gross hepatic or gallbladder abnormality, though significant artifacts are present. Pancreas: Normal appearance Spleen: Grossly normal appearance Adrenals/Urinary Tract: Adrenal thickening without mass. Cyst at upper pole LEFT kidney unchanged. No additional renal mass or hydronephrosis. Bladder decompressed. Stomach/Bowel: Increased stool and gas within rectum. Prior ileocolic resection. Lipoma at distal descending colon 2.4 x 1.6 cm image 61. Stomach and remaining bowel loops unremarkable. Vascular/Lymphatic: Extensive atherosclerotic calcifications aorta and iliac arteries without aneurysm. No adenopathy. Reproductive: Uterus surgically absent with nonvisualization of ovaries Other: No free air or free fluid. Small RIGHT inguinal hernia containing fat. Musculoskeletal: Osseous demineralization. Cannulated screws at proximal RIGHT femur. Old posttraumatic deformity of LEFT pubis. Chronic compression fracture of L1.  Extravasated contrast material at RIGHT arm. IMPRESSION: Increased stool and gas within rectum. Hepatic and LEFT renal cysts. Small RIGHT inguinal hernia containing fat. Bibasilar pulmonary infiltrates and atelectasis. No acute intra-abdominal or intrapelvic abnormalities. Aortic Atherosclerosis (ICD10-I70.0). Electronically Signed   By: Lavonia Dana M.D.   On: 08/24/2020 13:38   CT Head Wo Contrast  Result Date: 08/16/2020 CLINICAL DATA:  Altered level of consciousness, sepsis, tachypnea EXAM: CT HEAD WITHOUT CONTRAST TECHNIQUE: Contiguous axial images were obtained from the base of the skull through the vertex without intravenous contrast. COMPARISON:  10/18/2017 FINDINGS: Brain: No acute infarct or hemorrhage. Lateral ventricles and midline structures are stable. 2.5 x 2.2 cm mass within the anterior aspect of the right middle cranial fossa not appreciably changed since prior MRI dating to 2019, most consistent with meningioma. No significant mass effect. No acute extra-axial fluid collections. Vascular: No hyperdense vessel or unexpected calcification. Skull: Normal. Negative for fracture or focal lesion. Sinuses/Orbits: Diffuse mucosal thickening throughout the paranasal sinuses, most pronounced within the sphenoid sinuses. Other: None. IMPRESSION: 1. No acute intracranial process. 2. Probable meningioma right middle cranial fossa, without significant change since 2019. Electronically Signed   By: Randa Ngo M.D.   On: 08/16/2020 17:34   CT Angio Chest PE W/Cm &/Or Wo Cm  Result Date: 08/16/2020 CLINICAL DATA:  Altered level of consciousness, sepsis, tachypnea EXAM: CT ANGIOGRAPHY CHEST CT ABDOMEN AND PELVIS WITH CONTRAST TECHNIQUE: Multidetector  CT imaging of the chest was performed using the standard protocol during bolus administration of intravenous contrast. Multiplanar CT image reconstructions and MIPs were obtained to evaluate the vascular anatomy. Multidetector CT imaging of the abdomen and  pelvis was performed using the standard protocol during bolus administration of intravenous contrast. CONTRAST:  163mL OMNIPAQUE IOHEXOL 350 MG/ML SOLN COMPARISON:  08/16/2020, 09/17/2013 FINDINGS: CTA CHEST FINDINGS Cardiovascular: This is a technically adequate evaluation of the pulmonary vasculature. No filling defects or pulmonary emboli. The heart is enlarged with prominent left atrial dilatation. No pericardial effusion. Mild atherosclerosis of the aorta and coronary vessels. Mediastinum/Nodes: No enlarged mediastinal, hilar, or axillary lymph nodes. Thyroid gland, trachea, and esophagus demonstrate no significant findings. Lungs/Pleura: Dependent hypoventilatory changes are seen bilaterally. No acute airspace disease, effusion, or pneumothorax. Musculoskeletal: No acute or destructive bony lesions. Chronic appearing T9 compression deformity. Reconstructed images demonstrate no additional findings. Review of the MIP images confirms the above findings. CT ABDOMEN and PELVIS FINDINGS Hepatobiliary: Numerous hepatic cysts are again identified, without significant change since prior study. Gallbladder is unremarkable. Pancreas: Unremarkable. No pancreatic ductal dilatation or surrounding inflammatory changes. Spleen: Normal in size without focal abnormality. Adrenals/Urinary Tract: Bilateral renal cortical atrophy. Stable left renal cortical cyst. The adrenals are unremarkable. Bladder is decompressed with a Foley catheter. Stomach/Bowel: No bowel obstruction or ileus. Minimal descending colonic diverticulosis without diverticulitis. No bowel wall thickening or inflammatory change. Vascular/Lymphatic: Aortic atherosclerosis. No enlarged abdominal or pelvic lymph nodes. Reproductive: Status post hysterectomy. No adnexal masses. Other: No free fluid or free gas. No abdominal wall hernia. Musculoskeletal: No acute or destructive bony lesions. Chronic L1 compression deformity. Reconstructed images demonstrate no  additional findings. Review of the MIP images confirms the above findings. IMPRESSION: 1. No evidence of pulmonary embolus. 2. Cardiomegaly with prominent left atrial dilatation. 3. No acute intra-abdominal or intrapelvic process. 4. Minimal descending colonic diverticulosis without diverticulitis. 5. Aortic Atherosclerosis (ICD10-I70.0). Electronically Signed   By: Randa Ngo M.D.   On: 08/16/2020 17:44   CT Abdomen Pelvis W Contrast  Result Date: 08/16/2020 CLINICAL DATA:  Altered level of consciousness, sepsis, tachypnea EXAM: CT ANGIOGRAPHY CHEST CT ABDOMEN AND PELVIS WITH CONTRAST TECHNIQUE: Multidetector CT imaging of the chest was performed using the standard protocol during bolus administration of intravenous contrast. Multiplanar CT image reconstructions and MIPs were obtained to evaluate the vascular anatomy. Multidetector CT imaging of the abdomen and pelvis was performed using the standard protocol during bolus administration of intravenous contrast. CONTRAST:  141mL OMNIPAQUE IOHEXOL 350 MG/ML SOLN COMPARISON:  08/16/2020, 09/17/2013 FINDINGS: CTA CHEST FINDINGS Cardiovascular: This is a technically adequate evaluation of the pulmonary vasculature. No filling defects or pulmonary emboli. The heart is enlarged with prominent left atrial dilatation. No pericardial effusion. Mild atherosclerosis of the aorta and coronary vessels. Mediastinum/Nodes: No enlarged mediastinal, hilar, or axillary lymph nodes. Thyroid gland, trachea, and esophagus demonstrate no significant findings. Lungs/Pleura: Dependent hypoventilatory changes are seen bilaterally. No acute airspace disease, effusion, or pneumothorax. Musculoskeletal: No acute or destructive bony lesions. Chronic appearing T9 compression deformity. Reconstructed images demonstrate no additional findings. Review of the MIP images confirms the above findings. CT ABDOMEN and PELVIS FINDINGS Hepatobiliary: Numerous hepatic cysts are again identified,  without significant change since prior study. Gallbladder is unremarkable. Pancreas: Unremarkable. No pancreatic ductal dilatation or surrounding inflammatory changes. Spleen: Normal in size without focal abnormality. Adrenals/Urinary Tract: Bilateral renal cortical atrophy. Stable left renal cortical cyst. The adrenals are unremarkable. Bladder is decompressed with a Foley catheter. Stomach/Bowel: No  bowel obstruction or ileus. Minimal descending colonic diverticulosis without diverticulitis. No bowel wall thickening or inflammatory change. Vascular/Lymphatic: Aortic atherosclerosis. No enlarged abdominal or pelvic lymph nodes. Reproductive: Status post hysterectomy. No adnexal masses. Other: No free fluid or free gas. No abdominal wall hernia. Musculoskeletal: No acute or destructive bony lesions. Chronic L1 compression deformity. Reconstructed images demonstrate no additional findings. Review of the MIP images confirms the above findings. IMPRESSION: 1. No evidence of pulmonary embolus. 2. Cardiomegaly with prominent left atrial dilatation. 3. No acute intra-abdominal or intrapelvic process. 4. Minimal descending colonic diverticulosis without diverticulitis. 5. Aortic Atherosclerosis (ICD10-I70.0). Electronically Signed   By: Randa Ngo M.D.   On: 08/16/2020 17:44   DG Chest Port 1 View  Result Date: 08/24/2020 CLINICAL DATA:  Questionable sepsis EXAM: PORTABLE CHEST 1 VIEW COMPARISON:  Is 08/17/2020 FINDINGS: Cardiomegaly and aortic tortuosity. No aortic aneurysm by recent CT. Stable accentuated markings with scar-like appearance and atelectasis by recent CT. There is no edema, consolidation, effusion, or pneumothorax. Artifact from EKG leads. IMPRESSION: Stable from prior.  No acute finding. Electronically Signed   By: Monte Fantasia M.D.   On: 08/24/2020 05:43   DG Chest Port 1 View  Result Date: 08/17/2020 CLINICAL DATA:  Shortness of breath EXAM: PORTABLE CHEST 1 VIEW COMPARISON:  Film from  the previous day FINDINGS: Cardiac shadow is stable. Aortic calcifications are again seen. Lungs are well aerated bilaterally. No focal infiltrate or sizable effusion is seen. The atelectatic changes seen on prior CT examination are not well appreciated on today's exam. IMPRESSION: No acute abnormality noted. Atelectatic changes seen on yesterday's CT are not well appreciated on this exam. Electronically Signed   By: Inez Catalina M.D.   On: 08/17/2020 03:14   DG Chest Port 1 View  Result Date: 08/16/2020 CLINICAL DATA:  Possible sepsis EXAM: PORTABLE CHEST 1 VIEW COMPARISON:  03/18/2019 FINDINGS: Low lung volumes. Mild chronic interstitial prominence. No definite new consolidation. No pleural effusion. Similar cardiomediastinal contours with cardiomegaly. IMPRESSION: No acute process in the chest. Electronically Signed   By: Macy Mis M.D.   On: 08/16/2020 13:18   ECHOCARDIOGRAM COMPLETE  Result Date: 08/17/2020    ECHOCARDIOGRAM REPORT   Patient Name:   ANDY MOYE Date of Exam: 08/17/2020 Medical Rec #:  [id number]    Height:       61.0 in Accession #:    [id number]   Weight:       156.1 lb Date of Birth:  Mar 19, 1920    BSA:          1.700 m Patient Age:    53 years     BP:           94/78 mmHg Patient Gender: F            HR:           69 bpm. Exam Location:  [facility] Procedure: 2D Echo, Cardiac Doppler and Color Doppler Indications:    Atrial Fibrillation 427.31 / I48.91  History:        Patient has prior history of Echocardiogram examinations, most                 recent 01/07/2015. CHF, Previous Myocardial Infarction; Risk                 Factors:Hypertension. Dementia with behavioral disturbance,                 Parkinson disease, Cancer ([place]) (From Hx).  Sonographer:    Alvino Chapel RCS Referring Phys: [phone] CARLOS MADERA  Sonographer Comments: Could NOT obtain IVC or Suprasternal windows IMPRESSIONS  1. Left ventricular ejection fraction, by estimation, is 60 to 65%. The left ventricle has  normal function. The left ventricle has no regional wall motion abnormalities. There is severe left ventricular hypertrophy. Left ventricular diastolic parameters  are consistent with Grade II diastolic dysfunction (pseudonormalization).  2. Right ventricular systolic function is normal. The right ventricular size is normal.  3. Left atrial size was severely dilated.  4. The mitral valve is grossly normal. Mild to moderate mitral valve regurgitation.  5. The aortic valve is tricuspid. Aortic valve regurgitation is trivial. Mild aortic valve sclerosis is present, with no evidence of aortic valve stenosis.  6. There is a trivial pericardial effusion. Pericardial adipose tissue also noted.  7. Large hepatic cyst noted. FINDINGS  Left Ventricle: Left ventricular ejection fraction, by estimation, is 60 to 65%. The left ventricle has normal function. The left ventricle has no regional wall motion abnormalities. The left ventricular internal cavity size was normal in size. There is  severe left ventricular hypertrophy. Left ventricular diastolic parameters are consistent with Grade II diastolic dysfunction (pseudonormalization). Right Ventricle: The right ventricular size is normal. No increase in right ventricular wall thickness. Right ventricular systolic function is normal. Left Atrium: Left atrial size was severely dilated. Right Atrium: Right atrial size was normal in size. Pericardium: Trivial pericardial effusion is present. Presence of pericardial fat pad. Mitral Valve: The mitral valve is grossly normal. Mild mitral annular calcification. Mild to moderate mitral valve regurgitation. Tricuspid Valve: The tricuspid valve is grossly normal. Tricuspid valve regurgitation is mild. Aortic Valve: The aortic valve is tricuspid. There is moderate aortic valve annular calcification. Aortic valve regurgitation is trivial. Mild aortic valve sclerosis is present, with no evidence of aortic valve stenosis. Pulmonic Valve: The  pulmonic valve was not well visualized. Pulmonic valve regurgitation is trivial. Aorta: The aortic root is normal in size and structure. IAS/Shunts: No atrial level shunt detected by color flow Doppler.  LEFT VENTRICLE PLAX 2D LVIDd:         2.70 cm  Diastology LVIDs:         2.00 cm  LV e' medial:    3.05 cm/s LV PW:         1.70 cm  LV E/e' medial:  34.1 LV IVS:        1.80 cm  LV e' lateral:   4.03 cm/s LVOT diam:     1.50 cm  LV E/e' lateral: 25.8 LV SV:         48 LV SV Index:   28 LVOT Area:     1.77 cm  RIGHT VENTRICLE RV S prime:     7.29 cm/s TAPSE (M-mode): 1.2 cm LEFT ATRIUM             Index       RIGHT ATRIUM           Index LA diam:        4.70 cm 2.76 cm/m  RA Area:     14.00 cm LA Vol (A2C):   75.7 ml 44.53 ml/m RA Volume:   33.70 ml  19.82 ml/m LA Vol (A4C):   96.9 ml 57.00 ml/m LA Biplane Vol: 90.8 ml 53.42 ml/m  AORTIC VALVE LVOT Vmax:   134.00 cm/s LVOT Vmean:  89.600 cm/s LVOT VTI:    0.271 m  AORTA Ao Root diam:  2.80 cm MITRAL VALVE                TRICUSPID VALVE MV Area (PHT): 2.70 cm     TR Peak grad:   25.0 mmHg MV Decel Time: 281 msec     TR Vmax:        250.00 cm/s MV E velocity: 104.00 cm/s MV A velocity: 89.20 cm/s   SHUNTS MV E/A ratio:  1.17         Systemic VTI:  0.27 m                             Systemic Diam: 1.50 cm Rozann Lesches MD Electronically signed by Rozann Lesches MD Signature Date/Time: 08/17/2020/3:37:17 PM    Final     (Echo, Carotid, EGD, Colonoscopy, ERCP)    Subjective: Patient seen and examined in the morning rounds.  She was comfortable and medicated.  Unresponsive. Went back to examine patient and discussed with family members her daughter and younger sister at the bedside.  Family was quite stressed, disappointed and just wanting to take patient home knowing that she is at her end-of-life.  They understand that she is at end-of-life and they wanted her to be home surrounded by family.    Discharge Exam: Vitals:   08/29/20 1500 08/30/20  0618  BP: 114/67 (!) 144/86  Pulse: 94 78  Resp: 16 20  Temp: 97.9 F (36.6 C) 99.2 F (37.3 C)  SpO2: 90%    Vitals:   08/28/20 1812 08/28/20 2153 08/29/20 1500 08/30/20 0618  BP: (!) 124/93 123/75 114/67 (!) 144/86  Pulse: 84 86 94 78  Resp: 20 18 16 20   Temp: 98.6 F (37 C) (!) 97.5 F (36.4 C) 97.9 F (36.6 C) 99.2 F (37.3 C)  TempSrc: Oral Oral Axillary Axillary  SpO2: 93% 90% 90%   Weight:      Height:        General: Patient looks comfortable, audible rattling noise in upper airway sounds.  Sick looking. Sleepy, lethargic, unable to communicate.  Does not follow commands.  Moans on stimuli. Cardiovascular: RRR, S1/S2 +, no rubs, no gallops Respiratory: Mostly conducted airway sounds. Abdominal: Soft, NT, ND, bowel sounds +, obese but nontender. Extremities: Anasarca.  More on the upper extremities.    The results of significant diagnostics from this hospitalization (including imaging, microbiology, ancillary and laboratory) are listed below for reference.     Microbiology: Recent Results (from the past 240 hour(s))  Blood Culture (routine x 2)     Status: None   Collection Time: 08/24/20  4:47 AM   Specimen: BLOOD  Result Value Ref Range Status   Specimen Description BLOOD RIGHT ARM  Final   Special Requests   Final    BOTTLES DRAWN AEROBIC AND ANAEROBIC Blood Culture adequate volume   Culture   Final    NO GROWTH 5 DAYS Performed at [facility], [address], [place], [place] [address]    Report Status 08/29/2020 FINAL  Final  Urine culture     Status: Abnormal   Collection Time: 08/24/20  4:47 AM   Specimen: In/Out Cath Urine  Result Value Ref Range Status   Specimen Description   Final    IN/OUT CATH URINE Performed at [facility], [address] [place], [place] [address]    Special Requests   Final    NONE Performed at [facility], [address] [place], [place]  [address]    Culture 50,000 COLONIES/mL YEAST (A)  Final   Report Status  08/26/2020 FINAL  Final  Resp Panel by RT-PCR (Flu A&B, Covid) Nasopharyngeal Swab     Status: None   Collection Time: 08/24/20  5:29 AM   Specimen: Nasopharyngeal Swab; Nasopharyngeal(NP) swabs in vial transport medium  Result Value Ref Range Status   SARS Coronavirus 2 by RT PCR NEGATIVE NEGATIVE Final    Comment: (NOTE) SARS-CoV-2 target nucleic acids are NOT DETECTED.  The SARS-CoV-2 RNA is generally detectable in upper respiratory specimens during the acute phase of infection. The lowest concentration of SARS-CoV-2 viral copies this assay can detect is 138 copies/mL. A negative result does not preclude SARS-Cov-2 infection and should not be used as the sole basis for treatment or other patient management decisions. A negative result may occur with  improper specimen collection/handling, submission of specimen other than nasopharyngeal swab, presence of viral mutation(s) within the areas targeted by this assay, and inadequate number of viral copies(<138 copies/mL). A negative result must be combined with clinical observations, patient history, and epidemiological information. The expected result is Negative.  Fact Sheet for Patients:  EntrepreneurPulse.com.au  Fact Sheet for Healthcare Providers:  IncredibleEmployment.be  This test is no t yet approved or cleared by the [place] FDA and  has been authorized for detection and/or diagnosis of SARS-CoV-2 by FDA under an Emergency Use Authorization (EUA). This EUA will remain  in effect (meaning this test can be used) for the duration of the COVID-19 declaration under Section 564(b)(1) of the Act, 21 U.S.C.section 360bbb-3(b)(1), unless the authorization is terminated  or revoked sooner.       Influenza A by PCR NEGATIVE NEGATIVE Final   Influenza B by PCR NEGATIVE NEGATIVE Final    Comment: (NOTE) The Xpert Xpress SARS-CoV-2/FLU/RSV plus assay is intended as an aid in the diagnosis of  influenza from Nasopharyngeal swab specimens and should not be used as a sole basis for treatment. Nasal washings and aspirates are unacceptable for Xpert Xpress SARS-CoV-2/FLU/RSV testing.  Fact Sheet for Patients: EntrepreneurPulse.com.au  Fact Sheet for Healthcare Providers: IncredibleEmployment.be  This test is not yet approved or cleared by the [place] FDA and has been authorized for detection and/or diagnosis of SARS-CoV-2 by FDA under an Emergency Use Authorization (EUA). This EUA will remain in effect (meaning this test can be used) for the duration of the COVID-19 declaration under Section 564(b)(1) of the Act, 21 U.S.C. section 360bbb-3(b)(1), unless the authorization is terminated or revoked.  Performed at [facility], [address], [place], [place] [address]      Labs: BNP (last 3 results) Recent Labs    08/16/20 1312  BNP 517.0*   Basic Metabolic Panel: Recent Labs  Lab 08/24/20 0447 08/25/20 0652 08/26/20 0430 08/27/20 0732  NA 151* 149* 152* 152*  K 3.6 3.3* 4.1 5.3*  CL 116* 119* 121* 124*  CO2 24 21* 23 19*  GLUCOSE 195* 150* 116* 91  BUN 37* 42* 38* 24*  CREATININE 1.50* 1.13* 1.14* 0.82  CALCIUM 8.4* 7.5* 7.9* 7.7*  MG  --   --  1.9  --   PHOS  --   --  2.4*  --    Liver Function Tests: Recent Labs  Lab 08/24/20 0447 08/25/20 0652 08/26/20 0430 08/27/20 0732  AST 23 26 20 25   ALT 24 23 21 18   ALKPHOS 51 43 45 62  BILITOT 1.0 0.5 0.5 1.0  PROT 7.3 6.2* 6.0* 5.4*  ALBUMIN 3.3* 2.7* 2.6* 2.2*   No results for input(s): LIPASE, AMYLASE in the last 168 hours. No results for input(s): AMMONIA in the last 168 hours. CBC: Recent Labs  Lab 08/24/20 0447 08/25/20 0426 08/26/20 0430  WBC 34.4* 41.0* 28.0*  NEUTROABS 30.5*  --  25.3*  HGB 12.3 10.9* 9.1*  HCT 41.0 35.7* 31.6*  MCV 75.8* 73.9* 77.8*  PLT 345 201 227   Cardiac Enzymes: Recent Labs  Lab 08/25/20 0652  CKTOTAL 429*    BNP: Invalid input(s): POCBNP CBG: No results for input(s): GLUCAP in the last 168 hours. D-Dimer No results for input(s): DDIMER in the last 72 hours. Hgb A1c No results for input(s): HGBA1C in the last 72 hours. Lipid Profile No results for input(s): CHOL, HDL, LDLCALC, TRIG, CHOLHDL, LDLDIRECT in the last 72 hours. Thyroid function studies No results for input(s): TSH, T4TOTAL, T3FREE, THYROIDAB in the last 72 hours.  Invalid input(s): FREET3 Anemia work up No results for input(s): VITAMINB12, FOLATE, FERRITIN, TIBC, IRON, RETICCTPCT in the last 72 hours. Urinalysis    Component Value Date/Time   COLORURINE YELLOW 08/24/2020 0447   APPEARANCEUR CLOUDY (A) 08/24/2020 0447   LABSPEC 1.024 08/24/2020 0447   PHURINE 5.0 08/24/2020 0447   GLUCOSEU NEGATIVE 08/24/2020 0447   HGBUR MODERATE (A) 08/24/2020 0447   BILIRUBINUR NEGATIVE 08/24/2020 0447   KETONESUR NEGATIVE 08/24/2020 0447   PROTEINUR >=300 (A) 08/24/2020 0447   UROBILINOGEN 0.2 01/06/2015 1842   NITRITE NEGATIVE 08/24/2020 0447   LEUKOCYTESUR MODERATE (A) 08/24/2020 0447   Sepsis Labs Invalid input(s): PROCALCITONIN,  WBC,  LACTICIDVEN Microbiology Recent Results (from the past 240 hour(s))  Blood Culture (routine x 2)     Status: None   Collection Time: 08/24/20  4:47 AM   Specimen: BLOOD  Result Value Ref Range Status   Specimen Description BLOOD RIGHT ARM  Final   Special Requests   Final    BOTTLES DRAWN AEROBIC AND ANAEROBIC Blood Culture adequate volume   Culture   Final    NO GROWTH 5 DAYS Performed at [facility], [address] [place], [place] [address]    Report Status 08/29/2020 FINAL  Final  Urine culture     Status: Abnormal   Collection Time: 08/24/20  4:47 AM   Specimen: In/Out Cath Urine  Result Value Ref Range Status   Specimen Description   Final    IN/OUT CATH URINE Performed at [facility], [address], [place], [place] [address]    Special Requests   Final     NONE Performed at [facility], [address], [place], [place] [address]    Culture 50,000 COLONIES/mL YEAST (A)  Final   Report Status 08/26/2020 FINAL  Final  Resp Panel by RT-PCR (Flu A&B, Covid) Nasopharyngeal Swab     Status: None   Collection Time: 08/24/20  5:29 AM   Specimen: Nasopharyngeal Swab; Nasopharyngeal(NP) swabs in vial transport medium  Result Value Ref Range Status   SARS Coronavirus 2 by RT PCR NEGATIVE NEGATIVE Final    Comment: (NOTE) SARS-CoV-2 target nucleic acids are NOT DETECTED.  The SARS-CoV-2 RNA is generally detectable in upper respiratory specimens during the acute phase of infection. The lowest concentration of SARS-CoV-2 viral copies this assay can detect is 138 copies/mL. A negative result does not preclude SARS-Cov-2 infection and should not be used as the sole basis for treatment or other patient management decisions. A negative result may occur with  improper specimen collection/handling, submission of specimen  other than nasopharyngeal swab, presence of viral mutation(s) within the areas targeted by this assay, and inadequate number of viral copies(<138 copies/mL). A negative result must be combined with clinical observations, patient history, and epidemiological information. The expected result is Negative.  Fact Sheet for Patients:  EntrepreneurPulse.com.au  Fact Sheet for Healthcare Providers:  IncredibleEmployment.be  This test is no t yet approved or cleared by the [place] FDA and  has been authorized for detection and/or diagnosis of SARS-CoV-2 by FDA under an Emergency Use Authorization (EUA). This EUA will remain  in effect (meaning this test can be used) for the duration of the COVID-19 declaration under Section 564(b)(1) of the Act, 21 U.S.C.section 360bbb-3(b)(1), unless the authorization is terminated  or revoked sooner.       Influenza A by PCR NEGATIVE NEGATIVE Final   Influenza B  by PCR NEGATIVE NEGATIVE Final    Comment: (NOTE) The Xpert Xpress SARS-CoV-2/FLU/RSV plus assay is intended as an aid in the diagnosis of influenza from Nasopharyngeal swab specimens and should not be used as a sole basis for treatment. Nasal washings and aspirates are unacceptable for Xpert Xpress SARS-CoV-2/FLU/RSV testing.  Fact Sheet for Patients: EntrepreneurPulse.com.au  Fact Sheet for Healthcare Providers: IncredibleEmployment.be  This test is not yet approved or cleared by the [place] FDA and has been authorized for detection and/or diagnosis of SARS-CoV-2 by FDA under an Emergency Use Authorization (EUA). This EUA will remain in effect (meaning this test can be used) for the duration of the COVID-19 declaration under Section 564(b)(1) of the Act, 21 U.S.C. section 360bbb-3(b)(1), unless the authorization is terminated or revoked.  Performed at [facility], [address] [place], [place] [address]      Time coordinating discharge: 45 minutes  SIGNED:   Barb Merino, MD  [facility] 08/30/2020, 1:14 PM

## 2020-08-30 NOTE — Progress Notes (Signed)
Nsg Discharge Note  Admit Date:  08/24/2020 Discharge date: 08/30/2020   [name] to be D/C'd Home per MD order.  AVS completed.  Patient's daughter [name]  able to verbalize understanding.  Discharge Medication: Allergies as of 08/30/2020      Reactions   Adhesive [tape] Rash   Ensure Pudding [nutritional Supplements] Swelling, Rash   Throat swelling   Penicillins Swelling, Rash   Has patient had a PCN reaction causing immediate rash, facial/tongue/throat swelling, SOB or lightheadedness with hypotension: Yes Has patient had a PCN reaction causing severe rash involving mucus membranes or skin necrosis: Yes Has patient had a PCN reaction that required hospitalization No Has patient had a PCN reaction occurring within the last 10 years: No If all of the above answers are "NO", then may proceed with Cephalosporin use.      Medication List    STOP taking these medications   amiodarone 200 MG tablet Commonly known as: PACERONE   aspirin EC 81 MG tablet   ferrous sulfate 325 (65 FE) MG EC tablet   metoprolol tartrate 50 MG tablet Commonly known as: LOPRESSOR     TAKE these medications   acetaminophen 500 MG tablet Commonly known as: TYLENOL Take 1 tablet (500 mg total) by mouth every 6 (six) hours as needed. For pain What changed:   when to take this  reasons to take this   benztropine 1 MG tablet Commonly known as: COGENTIN Take 1 tablet (1 mg total) by mouth daily.   citalopram 20 MG tablet Commonly known as: CELEXA Take 1 tablet (20 mg total) by mouth every morning.   glycopyrrolate 1 MG tablet Commonly known as: Robinul Take 1 tablet (1 mg total) by mouth 3 (three) times daily.   haloperidol 2 MG/ML solution Commonly known as: HALDOL Place 1 mL (2 mg total) under the tongue every 4 (four) hours as needed for agitation (or delirium).   ipratropium-albuterol 0.5-2.5 (3) MG/3ML Soln Commonly known as: DUONEB Take 3 mLs by nebulization 4 (four) times  daily.   oxyCODONE 20 MG/ML concentrated solution Commonly known as: ROXICODONE INTENSOL Take 0.5 mLs (10 mg total) by mouth every 4 (four) hours as needed for up to 5 days for severe pain (anxiety, distress, comfort care).   pantoprazole 40 MG tablet Commonly known as: Protonix Take 1 tablet (40 mg total) by mouth daily.   rivastigmine 9.5 mg/24hr Commonly known as: EXELON Place 1 patch (9.5 mg total) onto the skin daily. *Remove and discard used patches*   zolpidem 10 MG tablet Commonly known as: AMBIEN Take 0.5 tablets (5 mg total) by mouth at bedtime as needed for sleep.       Discharge Assessment: Vitals:   08/29/20 1500 08/30/20 0618  BP: 114/67 (!) 144/86  Pulse: 94 78  Resp: 16 20  Temp: 97.9 F (36.6 C) 99.2 F (37.3 C)  SpO2: 90%    Skin clean, dry and intact without evidence of skin break down, no evidence of skin tears noted. IV catheter discontinued intact. Site without signs and symptoms of complications - no redness or edema noted at insertion site, patient denies c/o pain - only slight tenderness at site.  Dressing with slight pressure applied.  D/c Instructions-Education: Discharge instructions given to patient's daughter [name]  with verbalized understanding. D/c education completed with patient's daughter  including follow up instructions, medication list, d/c activities limitations if indicated, with other d/c instructions as indicated by MD - patient able to verbalize  understanding, all questions fully answered. Patient's daughter  instructed to return to ED, call 911, or call MD for any changes in condition.  Patient escorted via [place], and D/C home via private auto.  Samantha Haughton Loletha Grayer, RN 08/30/2020 3:09 PM

## 2020-09-07 DIAGNOSIS — J449 Chronic obstructive pulmonary disease, unspecified: Secondary | ICD-10-CM | POA: Diagnosis not present

## 2020-09-08 DIAGNOSIS — R4020 Unspecified coma: Secondary | ICD-10-CM | POA: Diagnosis not present

## 2020-09-08 DIAGNOSIS — R404 Transient alteration of awareness: Secondary | ICD-10-CM | POA: Diagnosis not present

## 2020-09-18 DIAGNOSIS — 419620001 Death: Secondary | SNOMED CT | POA: Diagnosis not present

## 2020-09-18 DEATH — deceased
